# Patient Record
Sex: Female | Born: 1935
Health system: Southern US, Community
[De-identification: ages and names within clinical notes are randomized; demographics above are authoritative.]

## PROBLEM LIST (undated history)

## (undated) DIAGNOSIS — F32A Depression, unspecified: Secondary | ICD-10-CM

## (undated) DIAGNOSIS — N189 Chronic kidney disease, unspecified: Secondary | ICD-10-CM

## (undated) DIAGNOSIS — E119 Type 2 diabetes mellitus without complications: Secondary | ICD-10-CM

## (undated) DIAGNOSIS — K219 Gastro-esophageal reflux disease without esophagitis: Secondary | ICD-10-CM

## (undated) DIAGNOSIS — I10 Essential (primary) hypertension: Secondary | ICD-10-CM

## (undated) DIAGNOSIS — C801 Malignant (primary) neoplasm, unspecified: Secondary | ICD-10-CM

## (undated) DIAGNOSIS — F329 Major depressive disorder, single episode, unspecified: Secondary | ICD-10-CM

## (undated) DIAGNOSIS — M1990 Unspecified osteoarthritis, unspecified site: Secondary | ICD-10-CM

## (undated) HISTORY — PX: EYE SURGERY: SHX253

## (undated) HISTORY — PX: CERVICAL SPINE SURGERY: SHX589

## (undated) HISTORY — PX: APPENDECTOMY: SHX54

## (undated) HISTORY — PX: TONSILLECTOMY: SUR1361

## (undated) HISTORY — PX: BACK SURGERY: SHX140

---

## 1993-02-08 HISTORY — PX: BREAST SURGERY: SHX581

## 1997-05-09 ENCOUNTER — Encounter: Admission: RE | Admit: 1997-05-09 | Discharge: 1997-08-07 | Payer: Self-pay | Admitting: Oncology

## 1997-05-17 ENCOUNTER — Other Ambulatory Visit: Admission: RE | Admit: 1997-05-17 | Discharge: 1997-05-17 | Payer: Self-pay | Admitting: Oncology

## 1997-06-10 ENCOUNTER — Encounter: Admission: RE | Admit: 1997-06-10 | Discharge: 1997-09-08 | Payer: Self-pay | Admitting: Radiation Oncology

## 1997-06-27 ENCOUNTER — Ambulatory Visit (HOSPITAL_BASED_OUTPATIENT_CLINIC_OR_DEPARTMENT_OTHER): Admission: RE | Admit: 1997-06-27 | Discharge: 1997-06-27 | Payer: Self-pay

## 1997-10-01 ENCOUNTER — Other Ambulatory Visit: Admission: RE | Admit: 1997-10-01 | Discharge: 1997-10-01 | Payer: Self-pay | Admitting: *Deleted

## 1997-10-29 ENCOUNTER — Encounter: Admission: RE | Admit: 1997-10-29 | Discharge: 1998-01-27 | Payer: Self-pay

## 1998-09-11 ENCOUNTER — Other Ambulatory Visit: Admission: RE | Admit: 1998-09-11 | Discharge: 1998-09-11 | Payer: Self-pay | Admitting: *Deleted

## 1999-05-05 ENCOUNTER — Encounter: Admission: RE | Admit: 1999-05-05 | Discharge: 1999-05-05 | Payer: Self-pay

## 1999-08-06 ENCOUNTER — Other Ambulatory Visit: Admission: RE | Admit: 1999-08-06 | Discharge: 1999-08-06 | Payer: Self-pay | Admitting: *Deleted

## 2000-08-23 ENCOUNTER — Encounter: Admission: RE | Admit: 2000-08-23 | Discharge: 2000-08-23 | Payer: Self-pay

## 2001-01-19 ENCOUNTER — Ambulatory Visit (HOSPITAL_COMMUNITY): Admission: RE | Admit: 2001-01-19 | Discharge: 2001-01-19 | Payer: Self-pay | Admitting: Gastroenterology

## 2001-09-05 ENCOUNTER — Other Ambulatory Visit: Admission: RE | Admit: 2001-09-05 | Discharge: 2001-09-05 | Payer: Self-pay | Admitting: *Deleted

## 2002-08-01 ENCOUNTER — Encounter: Payer: Self-pay | Admitting: Emergency Medicine

## 2002-08-01 ENCOUNTER — Emergency Department (HOSPITAL_COMMUNITY): Admission: EM | Admit: 2002-08-01 | Discharge: 2002-08-01 | Payer: Self-pay

## 2002-09-04 ENCOUNTER — Other Ambulatory Visit: Admission: RE | Admit: 2002-09-04 | Discharge: 2002-09-04 | Payer: Self-pay | Admitting: *Deleted

## 2003-04-29 ENCOUNTER — Other Ambulatory Visit: Admission: RE | Admit: 2003-04-29 | Discharge: 2003-04-29 | Payer: Self-pay | Admitting: *Deleted

## 2003-05-24 ENCOUNTER — Inpatient Hospital Stay (HOSPITAL_COMMUNITY): Admission: EM | Admit: 2003-05-24 | Discharge: 2003-05-27 | Payer: Self-pay | Admitting: Emergency Medicine

## 2003-11-25 ENCOUNTER — Encounter: Admission: RE | Admit: 2003-11-25 | Discharge: 2003-11-25 | Payer: Self-pay | Admitting: Orthopedic Surgery

## 2003-12-09 ENCOUNTER — Encounter: Admission: RE | Admit: 2003-12-09 | Discharge: 2003-12-09 | Payer: Self-pay | Admitting: Orthopedic Surgery

## 2003-12-23 ENCOUNTER — Encounter: Admission: RE | Admit: 2003-12-23 | Discharge: 2003-12-23 | Payer: Self-pay | Admitting: Orthopedic Surgery

## 2004-02-07 ENCOUNTER — Ambulatory Visit: Payer: Self-pay | Admitting: Cardiovascular Disease

## 2004-02-07 ENCOUNTER — Inpatient Hospital Stay (HOSPITAL_COMMUNITY): Admission: EM | Admit: 2004-02-07 | Discharge: 2004-02-09 | Payer: Self-pay | Admitting: Emergency Medicine

## 2004-02-21 ENCOUNTER — Ambulatory Visit: Payer: Self-pay | Admitting: Cardiovascular Disease

## 2004-02-25 ENCOUNTER — Encounter: Admission: RE | Admit: 2004-02-25 | Discharge: 2004-02-25 | Payer: Self-pay | Admitting: Orthopaedic Surgery

## 2004-03-04 ENCOUNTER — Ambulatory Visit: Payer: Self-pay | Admitting: Cardiology

## 2004-03-09 ENCOUNTER — Inpatient Hospital Stay (HOSPITAL_COMMUNITY): Admission: RE | Admit: 2004-03-09 | Discharge: 2004-03-10 | Payer: Self-pay | Admitting: Orthopaedic Surgery

## 2004-03-20 ENCOUNTER — Ambulatory Visit: Payer: Self-pay

## 2004-03-22 ENCOUNTER — Inpatient Hospital Stay (HOSPITAL_COMMUNITY): Admission: EM | Admit: 2004-03-22 | Discharge: 2004-03-24 | Payer: Self-pay | Admitting: Emergency Medicine

## 2004-05-20 ENCOUNTER — Other Ambulatory Visit: Admission: RE | Admit: 2004-05-20 | Discharge: 2004-05-20 | Payer: Self-pay | Admitting: Obstetrics and Gynecology

## 2004-06-09 ENCOUNTER — Ambulatory Visit: Payer: Self-pay | Admitting: Oncology

## 2004-08-28 ENCOUNTER — Encounter: Admission: RE | Admit: 2004-08-28 | Discharge: 2004-08-28 | Payer: Self-pay | Admitting: Internal Medicine

## 2004-10-13 ENCOUNTER — Encounter: Admission: RE | Admit: 2004-10-13 | Discharge: 2004-10-13 | Payer: Self-pay | Admitting: Radiology

## 2005-02-02 ENCOUNTER — Inpatient Hospital Stay (HOSPITAL_COMMUNITY): Admission: EM | Admit: 2005-02-02 | Discharge: 2005-02-04 | Payer: Self-pay | Admitting: Emergency Medicine

## 2005-02-02 ENCOUNTER — Ambulatory Visit: Payer: Self-pay | Admitting: Cardiology

## 2005-06-09 ENCOUNTER — Ambulatory Visit: Payer: Self-pay | Admitting: Oncology

## 2005-06-11 LAB — CBC WITH DIFFERENTIAL/PLATELET
BASO%: 1 % (ref 0.0–2.0)
Basophils Absolute: 0.1 10*3/uL (ref 0.0–0.1)
EOS%: 2 % (ref 0.0–7.0)
Eosinophils Absolute: 0.2 10*3/uL (ref 0.0–0.5)
HCT: 36 % (ref 34.8–46.6)
HGB: 11.8 g/dL (ref 11.6–15.9)
LYMPH%: 31.5 % (ref 14.0–48.0)
MCH: 29.1 pg (ref 26.0–34.0)
MCHC: 32.8 g/dL (ref 32.0–36.0)
MCV: 88.6 fL (ref 81.0–101.0)
MONO#: 0.7 10*3/uL (ref 0.1–0.9)
MONO%: 8.5 % (ref 0.0–13.0)
NEUT#: 5 10*3/uL (ref 1.5–6.5)
NEUT%: 57 % (ref 39.6–76.8)
Platelets: 219 10*3/uL (ref 145–400)
RBC: 4.07 10*6/uL (ref 3.70–5.32)
RDW: 14 % (ref 11.3–14.5)
WBC: 8.8 10*3/uL (ref 3.9–10.0)
lymph#: 2.8 10*3/uL (ref 0.9–3.3)

## 2005-06-11 LAB — COMPREHENSIVE METABOLIC PANEL
ALT: 24 U/L (ref 0–40)
AST: 26 U/L (ref 0–37)
Albumin: 4.2 g/dL (ref 3.5–5.2)
Alkaline Phosphatase: 90 U/L (ref 39–117)
BUN: 33 mg/dL — ABNORMAL HIGH (ref 6–23)
CO2: 26 mEq/L (ref 19–32)
Calcium: 9.5 mg/dL (ref 8.4–10.5)
Chloride: 105 mEq/L (ref 96–112)
Creatinine, Ser: 1.4 mg/dL — ABNORMAL HIGH (ref 0.4–1.2)
Glucose, Bld: 113 mg/dL — ABNORMAL HIGH (ref 70–99)
Potassium: 4.1 mEq/L (ref 3.5–5.3)
Sodium: 138 mEq/L (ref 135–145)
Total Bilirubin: 0.4 mg/dL (ref 0.3–1.2)
Total Protein: 6.9 g/dL (ref 6.0–8.3)

## 2005-11-10 ENCOUNTER — Encounter: Admission: RE | Admit: 2005-11-10 | Discharge: 2005-12-10 | Payer: Self-pay | Admitting: Orthopaedic Surgery

## 2006-04-15 ENCOUNTER — Inpatient Hospital Stay (HOSPITAL_COMMUNITY): Admission: RE | Admit: 2006-04-15 | Discharge: 2006-04-19 | Payer: Self-pay | Admitting: Neurosurgery

## 2006-05-23 ENCOUNTER — Encounter: Admission: RE | Admit: 2006-05-23 | Discharge: 2006-05-23 | Payer: Self-pay | Admitting: Internal Medicine

## 2006-06-15 ENCOUNTER — Ambulatory Visit: Payer: Self-pay | Admitting: *Deleted

## 2006-07-27 ENCOUNTER — Encounter: Payer: Self-pay | Admitting: Cardiology

## 2006-07-27 ENCOUNTER — Ambulatory Visit: Payer: Self-pay

## 2006-07-27 ENCOUNTER — Ambulatory Visit: Payer: Self-pay | Admitting: Cardiology

## 2006-08-04 ENCOUNTER — Ambulatory Visit (HOSPITAL_COMMUNITY): Admission: RE | Admit: 2006-08-04 | Discharge: 2006-08-04 | Payer: Self-pay | Admitting: Internal Medicine

## 2006-08-07 ENCOUNTER — Emergency Department (HOSPITAL_COMMUNITY): Admission: EM | Admit: 2006-08-07 | Discharge: 2006-08-07 | Payer: Self-pay | Admitting: Emergency Medicine

## 2006-08-31 ENCOUNTER — Emergency Department (HOSPITAL_COMMUNITY): Admission: EM | Admit: 2006-08-31 | Discharge: 2006-08-31 | Payer: Self-pay | Admitting: *Deleted

## 2006-09-06 ENCOUNTER — Encounter: Admission: RE | Admit: 2006-09-06 | Discharge: 2006-09-06 | Payer: Self-pay | Admitting: Internal Medicine

## 2007-09-01 ENCOUNTER — Encounter: Admission: RE | Admit: 2007-09-01 | Discharge: 2007-09-01 | Payer: Self-pay | Admitting: Internal Medicine

## 2007-10-04 ENCOUNTER — Encounter: Admission: RE | Admit: 2007-10-04 | Discharge: 2007-11-22 | Payer: Self-pay | Admitting: Internal Medicine

## 2010-02-28 ENCOUNTER — Encounter: Payer: Self-pay | Admitting: Orthopaedic Surgery

## 2010-06-11 ENCOUNTER — Emergency Department (HOSPITAL_COMMUNITY)
Admission: EM | Admit: 2010-06-11 | Discharge: 2010-06-11 | Disposition: A | Discharge status: Home / Self Care | Payer: Medicare Other | Attending: Emergency Medicine | Admitting: Emergency Medicine

## 2010-06-11 ENCOUNTER — Emergency Department (HOSPITAL_COMMUNITY): Payer: Medicare Other

## 2010-06-11 DIAGNOSIS — Z7982 Long term (current) use of aspirin: Secondary | ICD-10-CM | POA: Insufficient documentation

## 2010-06-11 DIAGNOSIS — E119 Type 2 diabetes mellitus without complications: Secondary | ICD-10-CM | POA: Insufficient documentation

## 2010-06-11 DIAGNOSIS — K219 Gastro-esophageal reflux disease without esophagitis: Secondary | ICD-10-CM | POA: Insufficient documentation

## 2010-06-11 DIAGNOSIS — M79609 Pain in unspecified limb: Secondary | ICD-10-CM | POA: Insufficient documentation

## 2010-06-11 DIAGNOSIS — I10 Essential (primary) hypertension: Secondary | ICD-10-CM | POA: Insufficient documentation

## 2010-06-11 DIAGNOSIS — E7800 Pure hypercholesterolemia, unspecified: Secondary | ICD-10-CM | POA: Insufficient documentation

## 2010-06-11 DIAGNOSIS — Z79899 Other long term (current) drug therapy: Secondary | ICD-10-CM | POA: Insufficient documentation

## 2010-06-11 DIAGNOSIS — S9030XA Contusion of unspecified foot, initial encounter: Secondary | ICD-10-CM | POA: Insufficient documentation

## 2010-06-11 DIAGNOSIS — M7989 Other specified soft tissue disorders: Secondary | ICD-10-CM | POA: Insufficient documentation

## 2010-06-23 NOTE — Assessment & Plan Note (Signed)
[facility]                            CARDIOLOGY OFFICE NOTE   NAME:Deanna [name], Deanna [name]                    MRN:          [id number]  DATE:07/27/2006                            DOB:          04-10-1930    This is a very pleasant 77 year old white female patient who I saw on  Jun 15, 2006 for syncope which was felt secondary to orthostatic  hypotension in the setting of recent back surgery, anorexia, and  diarrhea. That had all resolved and when I saw her she had uncontrolled  hypertension because some of her medications were withheld. I restarted  her Benicar, hydrochlorothiazide. She has been keeping track of her  blood pressure at home and her atenolol was actually increased by Dr.  Clelia Croft from 50 mg a day to 50 b.i.d. Overall her stomach problems have  improved but her blood pressures have been running high, not quite as  high as when I saw her a month ago. They have been as low as 104 to 107  over 49, but as high as 170/80. Dr. Clelia Croft has also planned on putting on  a 24 hour blood pressure monitor once 1 becomes available in his office.  She had a 2D echo today. A preliminary report reveals normal left  ventricular function with mild TR.   CURRENT MEDICATIONS:  1. Zegerid 40 mg daily.  2. Vytorin 10/40 daily.  3. Evista 60 mg daily.  4. Lunesta 2 mg daily.  5. Aspirin 81 mg daily.  6. __________ 5 mg daily.  7. Benicar 40/25 one half daily.  8. Atenolol 50 mg daily b.i.d.   PHYSICAL EXAMINATION:  This is a very pleasant 77 year old white female  in no acute distress. Blood pressure is 148/67, pulse 61, weight 107 up  1 pound.  NECK: Without JVD, HJR, bruits or thyroid enlargement.  LUNGS: Clear anterior, posterior, and lateral.  HEART: Regular rate and rhythm at 61 beats per minute. Normal S1, S2.  She had a positive S4 with a question of a mid systolic click and a 1/6  systolic ejection murmur.  ABDOMEN: Soft, without organomegaly, masses,  lesions, or abnormal  tenderness.  EXTREMITIES: Without cyanosis, clubbing, or edema. She has good distal  pulses.   IMPRESSION:  1. Hypertension.  2. Syncope felt secondary to orthostatic hypotension in the setting of      recent surgery, anorexia, and diarrhea, resolved.  3. Normal coronary arteries in 2006. Normal left ventricular function.  4. Treated hyperlipidemia.  5. Gastroesophageal reflux disease.  6. Recent cervical neck surgery.   PLAN:  At this time I have asked the patient to increase her Benicar  40/25 mg to a whole tablet daily. She should continue her blood pressure  monitoring at home and go ahead with the 24 hour blood pressure monitor  with Dr. Clelia Croft. She will see Dr. Juanda Chance back in 1 to 2 months and call if  she has any further trouble.      Jacolyn Reedy, PA-C  Electronically Signed      Everardo Beals. Juanda Chance, MD, [facility]  Electronically Signed   ML/MedQ  DD: 07/27/2006  DT: 07/27/2006  Job #: [id number]

## 2010-06-26 NOTE — Discharge Summary (Signed)
NAMECAMDYN, LADEN NO.:  [id number]   MEDICAL RECORD NO.:  [id number]          PATIENT TYPE:  INP   LOCATION:  3007                         FACILITY:  [facility]   PHYSICIAN:  Payton Doughty, M.D.      DATE OF BIRTH:  [birth year]   DATE OF ADMISSION:  04/15/2006  DATE OF DISCHARGE:                               DISCHARGE SUMMARY   ADMISSION DIAGNOSIS:  Spondylosis C4-5, C5-6, C6-7.   DISCHARGE DIAGNOSIS:  Spondylosis C4-5, C5-6, C6-7.   OPERATION PERFORMED:  C4-5, C5-6, C6-7, anterior cervical decompression  fusion.   SURGEON:  Payton Doughty, M.D.   COMPLICATIONS:  None.   DISCHARGE STATUS:  Satisfactory.   INDICATIONS FOR PROCEDURE:  This 77 year old right-handed white lady  with cervical spondylytic myelopathy.  History and physical is recounted  in the chart.  She has a little bit of hypertension.  Neurologic exam  was intact with spasticity of the hands.  She was admitted after  ascertaining normal laboratory values and underwent an anterior  decompression fusion at 4-5, 5-6, 6-7.  Postoperatively, she has done  well.  She was out on the floor.  She did not need a drain.  She was on  a PCA for two days.  The PCA was stopped and now she is on oral  medications.  She had a mild amount of dysphagia that responded well to  two doses of Decadron.   On exam now, her strength is full, Hoffman's is negative.  She is being  discharged home to the care of her family.  Her follow up will be in the  [facility] office in about 10 days with an x-ray.           ______________________________  Payton Doughty, M.D.     MWR/MEDQ  D:  04/19/2006  T:  04/19/2006  Job:  [id number]

## 2010-06-26 NOTE — H&P (Signed)
NAMECHRISTINAMARIE, TALL NO.:  [id number]   MEDICAL RECORD NO.:  [id number]          PATIENT TYPE:  INP   LOCATION:  3007                         FACILITY:  [facility]   PHYSICIAN:  Payton Doughty, M.D.      DATE OF BIRTH:  09-08-1930   DATE OF ADMISSION:  04/15/2006  DATE OF DISCHARGE:                              HISTORY & PHYSICAL   ADMITTING DIAGNOSIS:  Spondylosis at C4-5, C5-6 and C6-7.   This very nice 77 year old right-handed white lady for nine months has  had increasing pain in her neck and across her shoulders and dysesthesia  in her hands.  MR shows cervical spondylosis with a cord compression and  she is admitted for an anterior decompression and fusion.   MEDICAL HISTORY:  Remarkable for hypertension.   SURGICAL HISTORY:  Mastectomy in 1998, biopsy on the right in 1990,  decompression by Dr. Ophelia Charter in 2006.   MEDICATIONS:  1. Atenolol 100 mg a day.  2. Benicar 40/25 one-half tab a day.  3. Vytorin 10/40 once a day.  4. Evista 60 mg a day.  5. Ultram 100 mg every eight hours.  6. Nexium 40 mg twice a day.  7. Lunesta 2 mg at night.  8. Cosamin vitamins and aspirin a day.  9. Hyoscyamine 0.375 mg on a p.r.n. basis.  10.Flax seeds, cinnamon, vitamin E, vitamin D and multivitamin and      uses Caltrate.   FAMILY HISTORY:  Both parents are deceased.  Hypertension and heart  disease run in the family.   SOCIAL HISTORY:  She does not smoke, drinks socially occasionally, is a  retired Engineer, materials.   REVIEW OF SYSTEMS:  Remarkable for glasses, balance disturbance,  hypertension, hypercholesterolemia, occasionally incontinence, arm  weakness, back pain, arm pain, arthritis, neck pain and diabetes.   PHYSICAL EXAMINATION:  HEENT exam is within normal limits.  She has  regional range of motion in her neck without limit.  Chest is clear.  Cardiac exam is regular rate and rhythm.  Neurologically, she is awake,  alert and oriented.  Cranial nerves are intact.  Motor  exam shows 5/5  strength throughout the upper and lower extremities.  There is no  current sensory deficit.  Reflexes are 2 at the biceps and triceps, 1 at  the brachioradialis.  She has a mildly positive Hoffmann's bilaterally.   MR demonstrates severe cervical spondylosis at C4-5, C5-6 and C6-7 and  posterior displacement of cord.   CLINICAL IMPRESSION:  Cervical spondylosis with myelopathy.   The plan is for an anterior decompression at C4-5, C5-6 and C6-7, the  risks and benefits of this approach have been discussed with her and she  wishes to proceed.           ______________________________  Payton Doughty, M.D.     MWR/MEDQ  D:  04/15/2006  T:  04/15/2006  Job:  [id number]

## 2010-06-26 NOTE — Cardiovascular Report (Signed)
NAMECHRISHA, Deanna [name]             ACCOUNT NO.:  [id number]   MEDICAL RECORD NO.:  [id number]          PATIENT TYPE:  INP   LOCATION:  6529                         FACILITY:  [facility]   PHYSICIAN:  Charlies Constable, M.D. [facility] DATE OF BIRTH:  [birth year]   DATE OF PROCEDURE:  02/03/2005  DATE OF DISCHARGE:                              CARDIAC CATHETERIZATION   CLINICAL HISTORY:  Deanna [name] is 77 years old and lives in [place].  She  has a history of hypertension and hyperlipidemia, and also a history of  GERD.  She awoke in the early morning hours with substernal chest pain with  heaviness in both arms.  She was admitted by Dr. Nicholes Mango with a  diagnosis of unstable angina.  ECG shows chronic left bundle branch block.   PROCEDURE:  The procedure was performed via the right femoral artery using  arterial sheath and 6 French preformed coronary catheters.  A front wall  arterial puncture was performed and Omnipaque contrast was used.  A distal  aortogram was performed to rule out renal vascular causes for hypertension.  The patient tolerated the procedure well and left the laboratory in  satisfactory condition.   RESULTS:  Left main coronary artery was free of significant disease.   Left anterior descending artery gave rise to a large diagonal branch, two  septal perforators, and a small diagonal branch.  These and the LAD proper  were free of significant disease. .   Circumflex artery gave rise to a small ramus branch, a large marginal  branch, a posterolateral branch, and a posterior descending branch.  These  vessels were free of significant disease.   The right coronary artery is a nondominant vessel that gave rise to two  right ventricular branches.  These vessels were free of significant disease.   The left ventriculogram performed in the RAO projection showed good wall  motion with no areas of hypokinesis.  The estimated ejection fraction was  60%.   The distal aortogram  showed 30% narrowing in the proximal portion of the  right renal artery.  There was also calcification in this vessel.   HEMODYNAMIC DATA:  The aortic pressure was 174/78 with a mean of 116.  The  left ventricular pressure was 174/17.   CONCLUSION:  Normal coronary angiography and left ventricular wall motion.   RECOMMENDATIONS:  Reassurance.  The etiology of the patient's chest pain is  not totally clear.  In view of the findings at catheterization, I think it  is unlikely her symptoms are cardiac.  Although not totally typical for  reflux, I  suspect her symptoms may be related to reflux and we will plan to intensify  her treatment for this by doubling her Nexium.  I spoke with Dr. Clelia Croft and  after the follow up with him next week, he can decide if any further  evaluation is needed.           ______________________________  Charlies Constable, M.D. [facility]     BB/MEDQ  D:  02/03/2005  T:  02/03/2005  Job:  [id number]   cc:   Kari Baars, M.D.  Fax: [phone]

## 2010-06-26 NOTE — Op Note (Signed)
NAMEKAJAL, SCALICI NO.:  [id number]   MEDICAL RECORD NO.:  [id number]          PATIENT TYPE:  INP   LOCATION:  3172                         FACILITY:  [facility]   PHYSICIAN:  Payton Doughty, M.D.      DATE OF BIRTH:  Jul 04, 1930   DATE OF PROCEDURE:  04/15/2006  DATE OF DISCHARGE:                               OPERATIVE REPORT   PREOPERATIVE DIAGNOSIS:  Spondylosis C4-5, C5-6 and C6-C7   POSTOPERATIVE DIAGNOSIS:  Spondylosis C4-5, C5-6 and C6-C7.   OPERATIVE PROCEDURE:  C4-5, C5-6, C6-7 anterior cervical decompression  and fusion with a Reflex Hybrid Plate.   SURGEON:  Payton Doughty, M.D.   ANESTHESIA:  General endotracheal.   PREP:  Sterile Betadine prep and scrub with alcohol wipe.   COMPLICATIONS:  None.   ASSISTANT:  Hilda Lias, M.D.   BODY OF TEXT:  This is a 77 year old lady with severe cervical  spondylitic myelopathy taken to the operative room, smoothly  anesthetized and intubated, placed spine on the operating table in the  halter head traction with the neck slightly extended.  Following shave,  prepping and draping in the usual sterile fashion, the skin was incised  starting three fingerbreadths below, extending two fingerbreadths above  the carotid tubercle in line with the sternocleidomastoid muscle on the  left side.  The platysma was identified, elevated, divided and  undermined.  Sternocleidomastoid was identified, and medial dissection  revealed the carotid artery tracked laterally to the left, trachea and  esophagus tracked laterally to the right, exposing the bones of the  anterior cervical spine.  Marker was placed.  Intraoperative x-ray  obtained to confirm correctness of the level.  Having confirmed  correctness of level, the longus colli was taken down bilaterally, and a  Shadow-Line self-retaining retractor was placed.  The esophagus was  carefully protected.  Diskectomy was carried out at C4-5, C5-6 and C6-7  under gross  observation.  There was a lot of ankylosis, and then gentle  retraction and dissection through the disk space allowed this to be  opened.  Once the disk spaces were opened, the operating microscope was  brought in, and we used microdissection technique to remove the  offending osteophytes and disk material, divide the posterior  longitudinal ligament and carried out foraminotomies over the nerve  roots bilaterally at each level.  The worst level was C5-6; second worst  was 4-5; C6-7 was the third worse.  Following complete decompression,  the wound was irrigated and hemostasis assured.  Eight-millimeter bone  grafts were fashioned from patellar allograft and tapped into place.  The Reflex Hybrid Plate was then placed, 51 mm.  The 12-mm screws were  used two in C4, two in C5, two in C6, and two in C7.  Intraoperative x-  ray showed good placement of bone grafts, plate and screws.  The  platysma and subcutaneous tissues were  reapproximated with 3-0 Vicryl in interrupted fashion.  The skin was  closed with 4-0 Vicryl in a running subcuticular fashion.  Benzoin and  Steri-Strips were placed and made occlusive with Telfa and OpSite.  The  patient placed in Aspen collar and returned to recovery room in good  condition.           ______________________________  Payton Doughty, M.D.     MWR/MEDQ  D:  04/15/2006  T:  04/15/2006  Job:  [id number]

## 2010-11-23 LAB — I-STAT 8, (EC8 V) (CONVERTED LAB)
Acid-base deficit: 1
BUN: 17
Bicarbonate: 21.3
Chloride: 105
Glucose, Bld: 101 — ABNORMAL HIGH
HCT: 44
Hemoglobin: 15
Operator id: 285841
Potassium: 3.9
Sodium: 135
TCO2: 22
pCO2, Ven: 28 — ABNORMAL LOW
pH, Ven: 7.491 — ABNORMAL HIGH

## 2010-11-23 LAB — POCT CARDIAC MARKERS
CKMB, poc: <1 — ABNORMAL LOW
Myoglobin, poc: 84.8
Operator id: 285841
Troponin i, poc: <0.05

## 2010-11-23 LAB — POCT I-STAT CREATININE
Creatinine, Ser: 1.3 — ABNORMAL HIGH
Operator id: 285841

## 2010-11-25 LAB — URINALYSIS, ROUTINE W REFLEX MICROSCOPIC
Bilirubin Urine: NEGATIVE
Glucose, UA: NEGATIVE
Hgb urine dipstick: NEGATIVE
Ketones, ur: NEGATIVE
Nitrite: NEGATIVE
Protein, ur: NEGATIVE
Specific Gravity, Urine: 1.016
Urobilinogen, UA: 0.2
pH: 7.5

## 2010-11-25 LAB — URINE CULTURE
Colony Count: NO GROWTH
Culture: NO GROWTH

## 2010-11-25 LAB — URINE MICROSCOPIC-ADD ON

## 2011-02-15 DIAGNOSIS — F333 Major depressive disorder, recurrent, severe with psychotic symptoms: Secondary | ICD-10-CM | POA: Diagnosis not present

## 2011-03-03 DIAGNOSIS — K648 Other hemorrhoids: Secondary | ICD-10-CM | POA: Diagnosis not present

## 2011-03-17 DIAGNOSIS — K648 Other hemorrhoids: Secondary | ICD-10-CM | POA: Diagnosis not present

## 2011-03-22 DIAGNOSIS — F333 Major depressive disorder, recurrent, severe with psychotic symptoms: Secondary | ICD-10-CM | POA: Diagnosis not present

## 2011-03-23 DIAGNOSIS — Z8262 Family history of osteoporosis: Secondary | ICD-10-CM | POA: Diagnosis not present

## 2011-03-23 DIAGNOSIS — M810 Age-related osteoporosis without current pathological fracture: Secondary | ICD-10-CM | POA: Diagnosis not present

## 2011-03-23 DIAGNOSIS — N951 Menopausal and female climacteric states: Secondary | ICD-10-CM | POA: Diagnosis not present

## 2011-03-23 DIAGNOSIS — Z124 Encounter for screening for malignant neoplasm of cervix: Secondary | ICD-10-CM | POA: Diagnosis not present

## 2011-03-31 DIAGNOSIS — K625 Hemorrhage of anus and rectum: Secondary | ICD-10-CM | POA: Diagnosis not present

## 2011-03-31 DIAGNOSIS — K648 Other hemorrhoids: Secondary | ICD-10-CM | POA: Diagnosis not present

## 2011-04-06 DIAGNOSIS — I10 Essential (primary) hypertension: Secondary | ICD-10-CM | POA: Diagnosis not present

## 2011-04-06 DIAGNOSIS — J069 Acute upper respiratory infection, unspecified: Secondary | ICD-10-CM | POA: Diagnosis not present

## 2011-04-19 DIAGNOSIS — F333 Major depressive disorder, recurrent, severe with psychotic symptoms: Secondary | ICD-10-CM | POA: Diagnosis not present

## 2011-05-19 DIAGNOSIS — F341 Dysthymic disorder: Secondary | ICD-10-CM | POA: Diagnosis not present

## 2011-05-19 DIAGNOSIS — E785 Hyperlipidemia, unspecified: Secondary | ICD-10-CM | POA: Diagnosis not present

## 2011-05-19 DIAGNOSIS — E119 Type 2 diabetes mellitus without complications: Secondary | ICD-10-CM | POA: Diagnosis not present

## 2011-05-19 DIAGNOSIS — I10 Essential (primary) hypertension: Secondary | ICD-10-CM | POA: Diagnosis not present

## 2011-05-26 DIAGNOSIS — F333 Major depressive disorder, recurrent, severe with psychotic symptoms: Secondary | ICD-10-CM | POA: Diagnosis not present

## 2011-05-26 DIAGNOSIS — Z1231 Encounter for screening mammogram for malignant neoplasm of breast: Secondary | ICD-10-CM | POA: Diagnosis not present

## 2011-07-21 DIAGNOSIS — F333 Major depressive disorder, recurrent, severe with psychotic symptoms: Secondary | ICD-10-CM | POA: Diagnosis not present

## 2011-08-30 DIAGNOSIS — F333 Major depressive disorder, recurrent, severe with psychotic symptoms: Secondary | ICD-10-CM | POA: Diagnosis not present

## 2011-09-27 DIAGNOSIS — F333 Major depressive disorder, recurrent, severe with psychotic symptoms: Secondary | ICD-10-CM | POA: Diagnosis not present

## 2011-09-29 DIAGNOSIS — M26629 Arthralgia of temporomandibular joint, unspecified side: Secondary | ICD-10-CM | POA: Diagnosis not present

## 2011-09-29 DIAGNOSIS — F341 Dysthymic disorder: Secondary | ICD-10-CM | POA: Diagnosis not present

## 2011-09-29 DIAGNOSIS — I10 Essential (primary) hypertension: Secondary | ICD-10-CM | POA: Diagnosis not present

## 2011-10-20 DIAGNOSIS — Z23 Encounter for immunization: Secondary | ICD-10-CM | POA: Diagnosis not present

## 2011-11-03 DIAGNOSIS — F333 Major depressive disorder, recurrent, severe with psychotic symptoms: Secondary | ICD-10-CM | POA: Diagnosis not present

## 2011-12-09 DIAGNOSIS — F333 Major depressive disorder, recurrent, severe with psychotic symptoms: Secondary | ICD-10-CM | POA: Diagnosis not present

## 2011-12-28 DIAGNOSIS — F333 Major depressive disorder, recurrent, severe with psychotic symptoms: Secondary | ICD-10-CM | POA: Diagnosis not present

## 2012-01-13 DIAGNOSIS — H40019 Open angle with borderline findings, low risk, unspecified eye: Secondary | ICD-10-CM | POA: Diagnosis not present

## 2012-01-13 DIAGNOSIS — H524 Presbyopia: Secondary | ICD-10-CM | POA: Diagnosis not present

## 2012-01-13 DIAGNOSIS — E11319 Type 2 diabetes mellitus with unspecified diabetic retinopathy without macular edema: Secondary | ICD-10-CM | POA: Diagnosis not present

## 2012-01-13 DIAGNOSIS — Z961 Presence of intraocular lens: Secondary | ICD-10-CM | POA: Diagnosis not present

## 2012-01-13 DIAGNOSIS — H43399 Other vitreous opacities, unspecified eye: Secondary | ICD-10-CM | POA: Diagnosis not present

## 2012-01-26 DIAGNOSIS — I10 Essential (primary) hypertension: Secondary | ICD-10-CM | POA: Diagnosis not present

## 2012-01-26 DIAGNOSIS — R05 Cough: Secondary | ICD-10-CM | POA: Diagnosis not present

## 2012-01-26 DIAGNOSIS — R059 Cough, unspecified: Secondary | ICD-10-CM | POA: Diagnosis not present

## 2012-01-26 DIAGNOSIS — K219 Gastro-esophageal reflux disease without esophagitis: Secondary | ICD-10-CM | POA: Diagnosis not present

## 2012-02-28 DIAGNOSIS — F333 Major depressive disorder, recurrent, severe with psychotic symptoms: Secondary | ICD-10-CM | POA: Diagnosis not present

## 2012-03-29 DIAGNOSIS — F333 Major depressive disorder, recurrent, severe with psychotic symptoms: Secondary | ICD-10-CM | POA: Diagnosis not present

## 2012-03-30 DIAGNOSIS — Z124 Encounter for screening for malignant neoplasm of cervix: Secondary | ICD-10-CM | POA: Diagnosis not present

## 2012-04-26 DIAGNOSIS — F333 Major depressive disorder, recurrent, severe with psychotic symptoms: Secondary | ICD-10-CM | POA: Diagnosis not present

## 2012-07-06 DIAGNOSIS — F333 Major depressive disorder, recurrent, severe with psychotic symptoms: Secondary | ICD-10-CM | POA: Diagnosis not present

## 2012-08-02 DIAGNOSIS — F333 Major depressive disorder, recurrent, severe with psychotic symptoms: Secondary | ICD-10-CM | POA: Diagnosis not present

## 2012-08-15 DIAGNOSIS — R269 Unspecified abnormalities of gait and mobility: Secondary | ICD-10-CM | POA: Diagnosis not present

## 2012-08-15 DIAGNOSIS — Z9181 History of falling: Secondary | ICD-10-CM | POA: Diagnosis not present

## 2012-08-15 DIAGNOSIS — IMO0002 Reserved for concepts with insufficient information to code with codable children: Secondary | ICD-10-CM | POA: Diagnosis not present

## 2012-08-15 DIAGNOSIS — I10 Essential (primary) hypertension: Secondary | ICD-10-CM | POA: Diagnosis not present

## 2012-08-16 DIAGNOSIS — Z961 Presence of intraocular lens: Secondary | ICD-10-CM | POA: Diagnosis not present

## 2012-08-16 DIAGNOSIS — E11319 Type 2 diabetes mellitus with unspecified diabetic retinopathy without macular edema: Secondary | ICD-10-CM | POA: Diagnosis not present

## 2012-08-16 DIAGNOSIS — H40019 Open angle with borderline findings, low risk, unspecified eye: Secondary | ICD-10-CM | POA: Diagnosis not present

## 2012-08-16 DIAGNOSIS — E1139 Type 2 diabetes mellitus with other diabetic ophthalmic complication: Secondary | ICD-10-CM | POA: Diagnosis not present

## 2012-08-24 DIAGNOSIS — Z9181 History of falling: Secondary | ICD-10-CM | POA: Diagnosis not present

## 2012-08-24 DIAGNOSIS — R269 Unspecified abnormalities of gait and mobility: Secondary | ICD-10-CM | POA: Diagnosis not present

## 2012-08-31 DIAGNOSIS — F333 Major depressive disorder, recurrent, severe with psychotic symptoms: Secondary | ICD-10-CM | POA: Diagnosis not present

## 2012-09-27 DIAGNOSIS — F333 Major depressive disorder, recurrent, severe with psychotic symptoms: Secondary | ICD-10-CM | POA: Diagnosis not present

## 2012-10-03 DIAGNOSIS — Z1231 Encounter for screening mammogram for malignant neoplasm of breast: Secondary | ICD-10-CM | POA: Diagnosis not present

## 2012-10-06 DIAGNOSIS — M19049 Primary osteoarthritis, unspecified hand: Secondary | ICD-10-CM | POA: Diagnosis not present

## 2012-10-06 DIAGNOSIS — E1169 Type 2 diabetes mellitus with other specified complication: Secondary | ICD-10-CM | POA: Diagnosis not present

## 2012-10-06 DIAGNOSIS — K589 Irritable bowel syndrome without diarrhea: Secondary | ICD-10-CM | POA: Diagnosis not present

## 2012-10-06 DIAGNOSIS — R82998 Other abnormal findings in urine: Secondary | ICD-10-CM | POA: Diagnosis not present

## 2012-10-06 DIAGNOSIS — I10 Essential (primary) hypertension: Secondary | ICD-10-CM | POA: Diagnosis not present

## 2012-10-06 DIAGNOSIS — R269 Unspecified abnormalities of gait and mobility: Secondary | ICD-10-CM | POA: Diagnosis not present

## 2012-10-06 DIAGNOSIS — R197 Diarrhea, unspecified: Secondary | ICD-10-CM | POA: Diagnosis not present

## 2012-10-06 DIAGNOSIS — N183 Chronic kidney disease, stage 3 unspecified: Secondary | ICD-10-CM | POA: Diagnosis not present

## 2012-10-06 DIAGNOSIS — E785 Hyperlipidemia, unspecified: Secondary | ICD-10-CM | POA: Diagnosis not present

## 2012-10-23 DIAGNOSIS — Z23 Encounter for immunization: Secondary | ICD-10-CM | POA: Diagnosis not present

## 2012-10-30 DIAGNOSIS — F333 Major depressive disorder, recurrent, severe with psychotic symptoms: Secondary | ICD-10-CM | POA: Diagnosis not present

## 2012-11-23 DIAGNOSIS — F333 Major depressive disorder, recurrent, severe with psychotic symptoms: Secondary | ICD-10-CM | POA: Diagnosis not present

## 2012-11-28 DIAGNOSIS — M4310 Spondylolisthesis, site unspecified: Secondary | ICD-10-CM | POA: Diagnosis not present

## 2012-11-28 DIAGNOSIS — M5137 Other intervertebral disc degeneration, lumbosacral region: Secondary | ICD-10-CM | POA: Diagnosis not present

## 2012-12-05 DIAGNOSIS — R634 Abnormal weight loss: Secondary | ICD-10-CM | POA: Diagnosis not present

## 2012-12-13 DIAGNOSIS — H40019 Open angle with borderline findings, low risk, unspecified eye: Secondary | ICD-10-CM | POA: Diagnosis not present

## 2013-01-18 DIAGNOSIS — E119 Type 2 diabetes mellitus without complications: Secondary | ICD-10-CM | POA: Diagnosis not present

## 2013-01-18 DIAGNOSIS — E11319 Type 2 diabetes mellitus with unspecified diabetic retinopathy without macular edema: Secondary | ICD-10-CM | POA: Diagnosis not present

## 2013-01-18 DIAGNOSIS — H35379 Puckering of macula, unspecified eye: Secondary | ICD-10-CM | POA: Diagnosis not present

## 2013-01-18 DIAGNOSIS — H40019 Open angle with borderline findings, low risk, unspecified eye: Secondary | ICD-10-CM | POA: Diagnosis not present

## 2013-01-29 DIAGNOSIS — F333 Major depressive disorder, recurrent, severe with psychotic symptoms: Secondary | ICD-10-CM | POA: Diagnosis not present

## 2013-03-12 DIAGNOSIS — F333 Major depressive disorder, recurrent, severe with psychotic symptoms: Secondary | ICD-10-CM | POA: Diagnosis not present

## 2013-04-09 DIAGNOSIS — Z23 Encounter for immunization: Secondary | ICD-10-CM | POA: Diagnosis not present

## 2013-04-09 DIAGNOSIS — E1139 Type 2 diabetes mellitus with other diabetic ophthalmic complication: Secondary | ICD-10-CM | POA: Diagnosis not present

## 2013-04-09 DIAGNOSIS — E785 Hyperlipidemia, unspecified: Secondary | ICD-10-CM | POA: Diagnosis not present

## 2013-04-09 DIAGNOSIS — N183 Chronic kidney disease, stage 3 unspecified: Secondary | ICD-10-CM | POA: Diagnosis not present

## 2013-04-09 DIAGNOSIS — Z1331 Encounter for screening for depression: Secondary | ICD-10-CM | POA: Diagnosis not present

## 2013-04-09 DIAGNOSIS — E11319 Type 2 diabetes mellitus with unspecified diabetic retinopathy without macular edema: Secondary | ICD-10-CM | POA: Diagnosis not present

## 2013-04-09 DIAGNOSIS — E1149 Type 2 diabetes mellitus with other diabetic neurological complication: Secondary | ICD-10-CM | POA: Diagnosis not present

## 2013-04-09 DIAGNOSIS — I10 Essential (primary) hypertension: Secondary | ICD-10-CM | POA: Diagnosis not present

## 2013-04-09 DIAGNOSIS — F341 Dysthymic disorder: Secondary | ICD-10-CM | POA: Diagnosis not present

## 2013-04-09 DIAGNOSIS — F333 Major depressive disorder, recurrent, severe with psychotic symptoms: Secondary | ICD-10-CM | POA: Diagnosis not present

## 2013-04-09 DIAGNOSIS — G609 Hereditary and idiopathic neuropathy, unspecified: Secondary | ICD-10-CM | POA: Diagnosis not present

## 2013-04-19 DIAGNOSIS — F333 Major depressive disorder, recurrent, severe with psychotic symptoms: Secondary | ICD-10-CM | POA: Diagnosis not present

## 2013-05-14 DIAGNOSIS — F333 Major depressive disorder, recurrent, severe with psychotic symptoms: Secondary | ICD-10-CM | POA: Diagnosis not present

## 2013-06-11 DIAGNOSIS — F333 Major depressive disorder, recurrent, severe with psychotic symptoms: Secondary | ICD-10-CM | POA: Diagnosis not present

## 2013-07-16 DIAGNOSIS — F333 Major depressive disorder, recurrent, severe with psychotic symptoms: Secondary | ICD-10-CM | POA: Diagnosis not present

## 2013-07-19 DIAGNOSIS — F331 Major depressive disorder, recurrent, moderate: Secondary | ICD-10-CM | POA: Diagnosis not present

## 2013-08-13 DIAGNOSIS — F331 Major depressive disorder, recurrent, moderate: Secondary | ICD-10-CM | POA: Diagnosis not present

## 2013-08-28 DIAGNOSIS — F341 Dysthymic disorder: Secondary | ICD-10-CM | POA: Diagnosis not present

## 2013-08-28 DIAGNOSIS — R197 Diarrhea, unspecified: Secondary | ICD-10-CM | POA: Diagnosis not present

## 2013-08-28 DIAGNOSIS — IMO0002 Reserved for concepts with insufficient information to code with codable children: Secondary | ICD-10-CM | POA: Diagnosis not present

## 2013-09-07 DIAGNOSIS — R197 Diarrhea, unspecified: Secondary | ICD-10-CM | POA: Diagnosis not present

## 2013-09-17 DIAGNOSIS — F331 Major depressive disorder, recurrent, moderate: Secondary | ICD-10-CM | POA: Diagnosis not present

## 2013-09-19 DIAGNOSIS — R197 Diarrhea, unspecified: Secondary | ICD-10-CM | POA: Diagnosis not present

## 2013-09-19 DIAGNOSIS — R634 Abnormal weight loss: Secondary | ICD-10-CM | POA: Diagnosis not present

## 2013-10-11 DIAGNOSIS — R978 Other abnormal tumor markers: Secondary | ICD-10-CM | POA: Diagnosis not present

## 2013-10-11 DIAGNOSIS — R634 Abnormal weight loss: Secondary | ICD-10-CM | POA: Diagnosis not present

## 2013-10-11 DIAGNOSIS — R197 Diarrhea, unspecified: Secondary | ICD-10-CM | POA: Diagnosis not present

## 2013-10-16 DIAGNOSIS — R634 Abnormal weight loss: Secondary | ICD-10-CM | POA: Diagnosis not present

## 2013-10-16 DIAGNOSIS — R197 Diarrhea, unspecified: Secondary | ICD-10-CM | POA: Diagnosis not present

## 2013-10-16 DIAGNOSIS — R978 Other abnormal tumor markers: Secondary | ICD-10-CM | POA: Diagnosis not present

## 2013-10-18 DIAGNOSIS — F331 Major depressive disorder, recurrent, moderate: Secondary | ICD-10-CM | POA: Diagnosis not present

## 2013-10-24 DIAGNOSIS — R82998 Other abnormal findings in urine: Secondary | ICD-10-CM | POA: Diagnosis not present

## 2013-10-24 DIAGNOSIS — M810 Age-related osteoporosis without current pathological fracture: Secondary | ICD-10-CM | POA: Diagnosis not present

## 2013-10-24 DIAGNOSIS — E1139 Type 2 diabetes mellitus with other diabetic ophthalmic complication: Secondary | ICD-10-CM | POA: Diagnosis not present

## 2013-10-24 DIAGNOSIS — Z1212 Encounter for screening for malignant neoplasm of rectum: Secondary | ICD-10-CM | POA: Diagnosis not present

## 2013-10-24 DIAGNOSIS — R809 Proteinuria, unspecified: Secondary | ICD-10-CM | POA: Diagnosis not present

## 2013-11-05 DIAGNOSIS — M19049 Primary osteoarthritis, unspecified hand: Secondary | ICD-10-CM | POA: Diagnosis not present

## 2013-11-05 DIAGNOSIS — Z1331 Encounter for screening for depression: Secondary | ICD-10-CM | POA: Diagnosis not present

## 2013-11-05 DIAGNOSIS — N183 Chronic kidney disease, stage 3 unspecified: Secondary | ICD-10-CM | POA: Diagnosis not present

## 2013-11-05 DIAGNOSIS — G609 Hereditary and idiopathic neuropathy, unspecified: Secondary | ICD-10-CM | POA: Diagnosis not present

## 2013-11-05 DIAGNOSIS — E785 Hyperlipidemia, unspecified: Secondary | ICD-10-CM | POA: Diagnosis not present

## 2013-11-05 DIAGNOSIS — K219 Gastro-esophageal reflux disease without esophagitis: Secondary | ICD-10-CM | POA: Diagnosis not present

## 2013-11-05 DIAGNOSIS — I10 Essential (primary) hypertension: Secondary | ICD-10-CM | POA: Diagnosis not present

## 2013-11-05 DIAGNOSIS — E1149 Type 2 diabetes mellitus with other diabetic neurological complication: Secondary | ICD-10-CM | POA: Diagnosis not present

## 2013-11-05 DIAGNOSIS — Z0000 Encounter for general adult medical examination without abnormal findings: Secondary | ICD-10-CM | POA: Diagnosis not present

## 2013-11-06 DIAGNOSIS — R197 Diarrhea, unspecified: Secondary | ICD-10-CM | POA: Diagnosis not present

## 2013-12-11 DIAGNOSIS — I10 Essential (primary) hypertension: Secondary | ICD-10-CM | POA: Diagnosis not present

## 2013-12-11 DIAGNOSIS — M19041 Primary osteoarthritis, right hand: Secondary | ICD-10-CM | POA: Diagnosis not present

## 2013-12-11 DIAGNOSIS — Z6820 Body mass index (BMI) 20.0-20.9, adult: Secondary | ICD-10-CM | POA: Diagnosis not present

## 2013-12-11 DIAGNOSIS — M810 Age-related osteoporosis without current pathological fracture: Secondary | ICD-10-CM | POA: Diagnosis not present

## 2013-12-19 DIAGNOSIS — F331 Major depressive disorder, recurrent, moderate: Secondary | ICD-10-CM | POA: Diagnosis not present

## 2013-12-24 DIAGNOSIS — R197 Diarrhea, unspecified: Secondary | ICD-10-CM | POA: Diagnosis not present

## 2014-01-16 DIAGNOSIS — F331 Major depressive disorder, recurrent, moderate: Secondary | ICD-10-CM | POA: Diagnosis not present

## 2014-02-27 DIAGNOSIS — Z6820 Body mass index (BMI) 20.0-20.9, adult: Secondary | ICD-10-CM | POA: Diagnosis not present

## 2014-02-27 DIAGNOSIS — E1139 Type 2 diabetes mellitus with other diabetic ophthalmic complication: Secondary | ICD-10-CM | POA: Diagnosis not present

## 2014-02-27 DIAGNOSIS — I10 Essential (primary) hypertension: Secondary | ICD-10-CM | POA: Diagnosis not present

## 2014-02-27 DIAGNOSIS — N183 Chronic kidney disease, stage 3 (moderate): Secondary | ICD-10-CM | POA: Diagnosis not present

## 2014-02-27 DIAGNOSIS — R197 Diarrhea, unspecified: Secondary | ICD-10-CM | POA: Diagnosis not present

## 2014-02-27 DIAGNOSIS — G629 Polyneuropathy, unspecified: Secondary | ICD-10-CM | POA: Diagnosis not present

## 2014-02-27 DIAGNOSIS — M810 Age-related osteoporosis without current pathological fracture: Secondary | ICD-10-CM | POA: Diagnosis not present

## 2014-02-27 DIAGNOSIS — E1149 Type 2 diabetes mellitus with other diabetic neurological complication: Secondary | ICD-10-CM | POA: Diagnosis not present

## 2014-02-27 DIAGNOSIS — E785 Hyperlipidemia, unspecified: Secondary | ICD-10-CM | POA: Diagnosis not present

## 2014-02-27 DIAGNOSIS — F418 Other specified anxiety disorders: Secondary | ICD-10-CM | POA: Diagnosis not present

## 2014-02-28 DIAGNOSIS — F331 Major depressive disorder, recurrent, moderate: Secondary | ICD-10-CM | POA: Diagnosis not present

## 2014-03-15 DIAGNOSIS — Z853 Personal history of malignant neoplasm of breast: Secondary | ICD-10-CM | POA: Diagnosis not present

## 2014-03-15 DIAGNOSIS — Z1231 Encounter for screening mammogram for malignant neoplasm of breast: Secondary | ICD-10-CM | POA: Diagnosis not present

## 2014-04-26 DIAGNOSIS — Z681 Body mass index (BMI) 19 or less, adult: Secondary | ICD-10-CM | POA: Diagnosis not present

## 2014-04-26 DIAGNOSIS — I10 Essential (primary) hypertension: Secondary | ICD-10-CM | POA: Diagnosis not present

## 2014-05-30 DIAGNOSIS — H524 Presbyopia: Secondary | ICD-10-CM | POA: Diagnosis not present

## 2014-05-30 DIAGNOSIS — H40013 Open angle with borderline findings, low risk, bilateral: Secondary | ICD-10-CM | POA: Diagnosis not present

## 2014-05-30 DIAGNOSIS — E11319 Type 2 diabetes mellitus with unspecified diabetic retinopathy without macular edema: Secondary | ICD-10-CM | POA: Diagnosis not present

## 2014-05-30 DIAGNOSIS — H04123 Dry eye syndrome of bilateral lacrimal glands: Secondary | ICD-10-CM | POA: Diagnosis not present

## 2014-06-05 DIAGNOSIS — F331 Major depressive disorder, recurrent, moderate: Secondary | ICD-10-CM | POA: Diagnosis not present

## 2014-07-03 DIAGNOSIS — F331 Major depressive disorder, recurrent, moderate: Secondary | ICD-10-CM | POA: Diagnosis not present

## 2014-07-31 DIAGNOSIS — F331 Major depressive disorder, recurrent, moderate: Secondary | ICD-10-CM | POA: Diagnosis not present

## 2014-10-07 DIAGNOSIS — M19041 Primary osteoarthritis, right hand: Secondary | ICD-10-CM | POA: Diagnosis not present

## 2014-10-08 ENCOUNTER — Encounter (HOSPITAL_COMMUNITY): Payer: Self-pay

## 2014-10-08 ENCOUNTER — Ambulatory Visit (HOSPITAL_COMMUNITY)
Admission: RE | Admit: 2014-10-08 | Discharge: 2014-10-08 | Disposition: A | Discharge status: Home / Self Care | Payer: Medicare Other | Source: Ambulatory Visit | Attending: Internal Medicine | Admitting: Internal Medicine

## 2014-10-08 DIAGNOSIS — M810 Age-related osteoporosis without current pathological fracture: Secondary | ICD-10-CM | POA: Diagnosis not present

## 2014-10-08 HISTORY — DX: Essential (primary) hypertension: I10

## 2014-10-08 HISTORY — DX: Unspecified osteoarthritis, unspecified site: M19.90

## 2014-10-08 HISTORY — DX: Malignant (primary) neoplasm, unspecified: C80.1

## 2014-10-08 HISTORY — DX: Major depressive disorder, single episode, unspecified: F32.9

## 2014-10-08 HISTORY — DX: Depression, unspecified: F32.A

## 2014-10-08 HISTORY — DX: Type 2 diabetes mellitus without complications: E11.9

## 2014-10-08 MED ORDER — DENOSUMAB 60 MG/ML ~~LOC~~ SOLN
60.0000 mg | Freq: Once | SUBCUTANEOUS | Status: AC
  Administered 2014-10-08: 60 mg via SUBCUTANEOUS
  Filled 2014-10-08: qty 1

## 2014-10-30 DIAGNOSIS — F331 Major depressive disorder, recurrent, moderate: Secondary | ICD-10-CM | POA: Diagnosis not present

## 2014-11-05 DIAGNOSIS — E1139 Type 2 diabetes mellitus with other diabetic ophthalmic complication: Secondary | ICD-10-CM | POA: Diagnosis not present

## 2014-11-05 DIAGNOSIS — N390 Urinary tract infection, site not specified: Secondary | ICD-10-CM | POA: Diagnosis not present

## 2014-11-05 DIAGNOSIS — E785 Hyperlipidemia, unspecified: Secondary | ICD-10-CM | POA: Diagnosis not present

## 2014-11-05 DIAGNOSIS — R8290 Unspecified abnormal findings in urine: Secondary | ICD-10-CM | POA: Diagnosis not present

## 2014-11-08 DIAGNOSIS — Z23 Encounter for immunization: Secondary | ICD-10-CM | POA: Diagnosis not present

## 2014-11-12 DIAGNOSIS — G629 Polyneuropathy, unspecified: Secondary | ICD-10-CM | POA: Diagnosis not present

## 2014-11-12 DIAGNOSIS — E11319 Type 2 diabetes mellitus with unspecified diabetic retinopathy without macular edema: Secondary | ICD-10-CM | POA: Diagnosis not present

## 2014-11-12 DIAGNOSIS — Z0000 Encounter for general adult medical examination without abnormal findings: Secondary | ICD-10-CM | POA: Diagnosis not present

## 2014-11-12 DIAGNOSIS — M810 Age-related osteoporosis without current pathological fracture: Secondary | ICD-10-CM | POA: Diagnosis not present

## 2014-11-12 DIAGNOSIS — E1139 Type 2 diabetes mellitus with other diabetic ophthalmic complication: Secondary | ICD-10-CM | POA: Diagnosis not present

## 2014-11-12 DIAGNOSIS — N183 Chronic kidney disease, stage 3 (moderate): Secondary | ICD-10-CM | POA: Diagnosis not present

## 2014-11-12 DIAGNOSIS — I10 Essential (primary) hypertension: Secondary | ICD-10-CM | POA: Diagnosis not present

## 2014-11-12 DIAGNOSIS — M19041 Primary osteoarthritis, right hand: Secondary | ICD-10-CM | POA: Diagnosis not present

## 2014-11-12 DIAGNOSIS — F418 Other specified anxiety disorders: Secondary | ICD-10-CM | POA: Diagnosis not present

## 2014-11-12 DIAGNOSIS — Z1389 Encounter for screening for other disorder: Secondary | ICD-10-CM | POA: Diagnosis not present

## 2014-11-12 DIAGNOSIS — E1149 Type 2 diabetes mellitus with other diabetic neurological complication: Secondary | ICD-10-CM | POA: Diagnosis not present

## 2014-11-12 DIAGNOSIS — Z681 Body mass index (BMI) 19 or less, adult: Secondary | ICD-10-CM | POA: Diagnosis not present

## 2014-11-27 DIAGNOSIS — F331 Major depressive disorder, recurrent, moderate: Secondary | ICD-10-CM | POA: Diagnosis not present

## 2014-12-05 DIAGNOSIS — F331 Major depressive disorder, recurrent, moderate: Secondary | ICD-10-CM | POA: Diagnosis not present

## 2014-12-23 DIAGNOSIS — F331 Major depressive disorder, recurrent, moderate: Secondary | ICD-10-CM | POA: Diagnosis not present

## 2014-12-27 DIAGNOSIS — M65341 Trigger finger, right ring finger: Secondary | ICD-10-CM | POA: Diagnosis not present

## 2014-12-27 DIAGNOSIS — M5380 Other specified dorsopathies, site unspecified: Secondary | ICD-10-CM | POA: Diagnosis not present

## 2014-12-27 DIAGNOSIS — Z681 Body mass index (BMI) 19 or less, adult: Secondary | ICD-10-CM | POA: Diagnosis not present

## 2014-12-27 DIAGNOSIS — R2689 Other abnormalities of gait and mobility: Secondary | ICD-10-CM | POA: Diagnosis not present

## 2014-12-27 DIAGNOSIS — R05 Cough: Secondary | ICD-10-CM | POA: Diagnosis not present

## 2015-01-06 DIAGNOSIS — H903 Sensorineural hearing loss, bilateral: Secondary | ICD-10-CM | POA: Diagnosis not present

## 2015-01-07 DIAGNOSIS — M545 Low back pain: Secondary | ICD-10-CM | POA: Diagnosis not present

## 2015-01-16 DIAGNOSIS — E1149 Type 2 diabetes mellitus with other diabetic neurological complication: Secondary | ICD-10-CM | POA: Diagnosis not present

## 2015-01-16 DIAGNOSIS — Z681 Body mass index (BMI) 19 or less, adult: Secondary | ICD-10-CM | POA: Diagnosis not present

## 2015-01-16 DIAGNOSIS — R21 Rash and other nonspecific skin eruption: Secondary | ICD-10-CM | POA: Diagnosis not present

## 2015-01-22 DIAGNOSIS — R2689 Other abnormalities of gait and mobility: Secondary | ICD-10-CM | POA: Diagnosis not present

## 2015-01-22 DIAGNOSIS — R531 Weakness: Secondary | ICD-10-CM | POA: Diagnosis not present

## 2015-02-24 DIAGNOSIS — F331 Major depressive disorder, recurrent, moderate: Secondary | ICD-10-CM | POA: Diagnosis not present

## 2015-03-07 DIAGNOSIS — M47816 Spondylosis without myelopathy or radiculopathy, lumbar region: Secondary | ICD-10-CM | POA: Diagnosis not present

## 2015-03-07 DIAGNOSIS — M19041 Primary osteoarthritis, right hand: Secondary | ICD-10-CM | POA: Diagnosis not present

## 2015-03-07 DIAGNOSIS — M545 Low back pain: Secondary | ICD-10-CM | POA: Diagnosis not present

## 2015-03-27 DIAGNOSIS — R8290 Unspecified abnormal findings in urine: Secondary | ICD-10-CM | POA: Diagnosis not present

## 2015-03-27 DIAGNOSIS — N3941 Urge incontinence: Secondary | ICD-10-CM | POA: Diagnosis not present

## 2015-03-27 DIAGNOSIS — M545 Low back pain: Secondary | ICD-10-CM | POA: Diagnosis not present

## 2015-03-27 DIAGNOSIS — N390 Urinary tract infection, site not specified: Secondary | ICD-10-CM | POA: Diagnosis not present

## 2015-03-27 DIAGNOSIS — Z681 Body mass index (BMI) 19 or less, adult: Secondary | ICD-10-CM | POA: Diagnosis not present

## 2015-04-02 DIAGNOSIS — F331 Major depressive disorder, recurrent, moderate: Secondary | ICD-10-CM | POA: Diagnosis not present

## 2015-04-07 DIAGNOSIS — N3941 Urge incontinence: Secondary | ICD-10-CM | POA: Diagnosis not present

## 2015-04-09 ENCOUNTER — Ambulatory Visit (HOSPITAL_COMMUNITY)
Admission: RE | Admit: 2015-04-09 | Discharge: 2015-04-09 | Disposition: A | Discharge status: Home / Self Care | Payer: Medicare Other | Source: Ambulatory Visit | Attending: Internal Medicine | Admitting: Internal Medicine

## 2015-04-14 DIAGNOSIS — H04123 Dry eye syndrome of bilateral lacrimal glands: Secondary | ICD-10-CM | POA: Diagnosis not present

## 2015-04-14 DIAGNOSIS — H16223 Keratoconjunctivitis sicca, not specified as Sjogren's, bilateral: Secondary | ICD-10-CM | POA: Diagnosis not present

## 2015-04-14 DIAGNOSIS — H539 Unspecified visual disturbance: Secondary | ICD-10-CM | POA: Diagnosis not present

## 2015-04-14 DIAGNOSIS — Z961 Presence of intraocular lens: Secondary | ICD-10-CM | POA: Diagnosis not present

## 2015-04-17 DIAGNOSIS — N3941 Urge incontinence: Secondary | ICD-10-CM | POA: Diagnosis not present

## 2015-04-17 DIAGNOSIS — R600 Localized edema: Secondary | ICD-10-CM | POA: Diagnosis not present

## 2015-04-17 DIAGNOSIS — I10 Essential (primary) hypertension: Secondary | ICD-10-CM | POA: Diagnosis not present

## 2015-04-17 DIAGNOSIS — Z1389 Encounter for screening for other disorder: Secondary | ICD-10-CM | POA: Diagnosis not present

## 2015-04-17 DIAGNOSIS — Z6820 Body mass index (BMI) 20.0-20.9, adult: Secondary | ICD-10-CM | POA: Diagnosis not present

## 2015-04-17 DIAGNOSIS — I951 Orthostatic hypotension: Secondary | ICD-10-CM | POA: Diagnosis not present

## 2015-04-21 DIAGNOSIS — Z1231 Encounter for screening mammogram for malignant neoplasm of breast: Secondary | ICD-10-CM | POA: Diagnosis not present

## 2015-04-21 DIAGNOSIS — Z853 Personal history of malignant neoplasm of breast: Secondary | ICD-10-CM | POA: Diagnosis not present

## 2015-04-29 DIAGNOSIS — D225 Melanocytic nevi of trunk: Secondary | ICD-10-CM | POA: Diagnosis not present

## 2015-04-29 DIAGNOSIS — C44729 Squamous cell carcinoma of skin of left lower limb, including hip: Secondary | ICD-10-CM | POA: Diagnosis not present

## 2015-04-30 DIAGNOSIS — F331 Major depressive disorder, recurrent, moderate: Secondary | ICD-10-CM | POA: Diagnosis not present

## 2015-05-15 DIAGNOSIS — Z6820 Body mass index (BMI) 20.0-20.9, adult: Secondary | ICD-10-CM | POA: Diagnosis not present

## 2015-05-15 DIAGNOSIS — E1139 Type 2 diabetes mellitus with other diabetic ophthalmic complication: Secondary | ICD-10-CM | POA: Diagnosis not present

## 2015-05-15 DIAGNOSIS — I10 Essential (primary) hypertension: Secondary | ICD-10-CM | POA: Diagnosis not present

## 2015-05-15 DIAGNOSIS — E784 Other hyperlipidemia: Secondary | ICD-10-CM | POA: Diagnosis not present

## 2015-05-15 DIAGNOSIS — N183 Chronic kidney disease, stage 3 (moderate): Secondary | ICD-10-CM | POA: Diagnosis not present

## 2015-05-15 DIAGNOSIS — I951 Orthostatic hypotension: Secondary | ICD-10-CM | POA: Diagnosis not present

## 2015-05-15 DIAGNOSIS — E1149 Type 2 diabetes mellitus with other diabetic neurological complication: Secondary | ICD-10-CM | POA: Diagnosis not present

## 2015-05-15 DIAGNOSIS — M545 Low back pain: Secondary | ICD-10-CM | POA: Diagnosis not present

## 2015-05-20 DIAGNOSIS — M6281 Muscle weakness (generalized): Secondary | ICD-10-CM | POA: Diagnosis not present

## 2015-05-20 DIAGNOSIS — R262 Difficulty in walking, not elsewhere classified: Secondary | ICD-10-CM | POA: Diagnosis not present

## 2015-05-20 DIAGNOSIS — N183 Chronic kidney disease, stage 3 (moderate): Secondary | ICD-10-CM | POA: Diagnosis not present

## 2015-05-20 DIAGNOSIS — M545 Low back pain: Secondary | ICD-10-CM | POA: Diagnosis not present

## 2015-05-21 DIAGNOSIS — N183 Chronic kidney disease, stage 3 (moderate): Secondary | ICD-10-CM | POA: Diagnosis not present

## 2015-05-21 DIAGNOSIS — M545 Low back pain: Secondary | ICD-10-CM | POA: Diagnosis not present

## 2015-05-21 DIAGNOSIS — R262 Difficulty in walking, not elsewhere classified: Secondary | ICD-10-CM | POA: Diagnosis not present

## 2015-05-21 DIAGNOSIS — M6281 Muscle weakness (generalized): Secondary | ICD-10-CM | POA: Diagnosis not present

## 2015-05-23 DIAGNOSIS — M6281 Muscle weakness (generalized): Secondary | ICD-10-CM | POA: Diagnosis not present

## 2015-05-23 DIAGNOSIS — N183 Chronic kidney disease, stage 3 (moderate): Secondary | ICD-10-CM | POA: Diagnosis not present

## 2015-05-23 DIAGNOSIS — M545 Low back pain: Secondary | ICD-10-CM | POA: Diagnosis not present

## 2015-05-23 DIAGNOSIS — R262 Difficulty in walking, not elsewhere classified: Secondary | ICD-10-CM | POA: Diagnosis not present

## 2015-05-26 DIAGNOSIS — N183 Chronic kidney disease, stage 3 (moderate): Secondary | ICD-10-CM | POA: Diagnosis not present

## 2015-05-26 DIAGNOSIS — M6281 Muscle weakness (generalized): Secondary | ICD-10-CM | POA: Diagnosis not present

## 2015-05-26 DIAGNOSIS — M545 Low back pain: Secondary | ICD-10-CM | POA: Diagnosis not present

## 2015-05-26 DIAGNOSIS — R262 Difficulty in walking, not elsewhere classified: Secondary | ICD-10-CM | POA: Diagnosis not present

## 2015-05-28 DIAGNOSIS — F331 Major depressive disorder, recurrent, moderate: Secondary | ICD-10-CM | POA: Diagnosis not present

## 2015-05-30 DIAGNOSIS — M545 Low back pain: Secondary | ICD-10-CM | POA: Diagnosis not present

## 2015-05-30 DIAGNOSIS — R262 Difficulty in walking, not elsewhere classified: Secondary | ICD-10-CM | POA: Diagnosis not present

## 2015-05-30 DIAGNOSIS — M6281 Muscle weakness (generalized): Secondary | ICD-10-CM | POA: Diagnosis not present

## 2015-05-30 DIAGNOSIS — N183 Chronic kidney disease, stage 3 (moderate): Secondary | ICD-10-CM | POA: Diagnosis not present

## 2015-06-02 DIAGNOSIS — M6281 Muscle weakness (generalized): Secondary | ICD-10-CM | POA: Diagnosis not present

## 2015-06-02 DIAGNOSIS — M545 Low back pain: Secondary | ICD-10-CM | POA: Diagnosis not present

## 2015-06-02 DIAGNOSIS — R262 Difficulty in walking, not elsewhere classified: Secondary | ICD-10-CM | POA: Diagnosis not present

## 2015-06-02 DIAGNOSIS — N183 Chronic kidney disease, stage 3 (moderate): Secondary | ICD-10-CM | POA: Diagnosis not present

## 2015-06-04 DIAGNOSIS — M545 Low back pain: Secondary | ICD-10-CM | POA: Diagnosis not present

## 2015-06-04 DIAGNOSIS — M6281 Muscle weakness (generalized): Secondary | ICD-10-CM | POA: Diagnosis not present

## 2015-06-04 DIAGNOSIS — R262 Difficulty in walking, not elsewhere classified: Secondary | ICD-10-CM | POA: Diagnosis not present

## 2015-06-04 DIAGNOSIS — N183 Chronic kidney disease, stage 3 (moderate): Secondary | ICD-10-CM | POA: Diagnosis not present

## 2015-06-11 DIAGNOSIS — M6281 Muscle weakness (generalized): Secondary | ICD-10-CM | POA: Diagnosis not present

## 2015-06-11 DIAGNOSIS — R262 Difficulty in walking, not elsewhere classified: Secondary | ICD-10-CM | POA: Diagnosis not present

## 2015-06-11 DIAGNOSIS — N183 Chronic kidney disease, stage 3 (moderate): Secondary | ICD-10-CM | POA: Diagnosis not present

## 2015-06-11 DIAGNOSIS — M545 Low back pain: Secondary | ICD-10-CM | POA: Diagnosis not present

## 2015-06-12 DIAGNOSIS — R262 Difficulty in walking, not elsewhere classified: Secondary | ICD-10-CM | POA: Diagnosis not present

## 2015-06-12 DIAGNOSIS — M6281 Muscle weakness (generalized): Secondary | ICD-10-CM | POA: Diagnosis not present

## 2015-06-12 DIAGNOSIS — N183 Chronic kidney disease, stage 3 (moderate): Secondary | ICD-10-CM | POA: Diagnosis not present

## 2015-06-12 DIAGNOSIS — M545 Low back pain: Secondary | ICD-10-CM | POA: Diagnosis not present

## 2015-06-17 DIAGNOSIS — Z08 Encounter for follow-up examination after completed treatment for malignant neoplasm: Secondary | ICD-10-CM | POA: Diagnosis not present

## 2015-06-17 DIAGNOSIS — B078 Other viral warts: Secondary | ICD-10-CM | POA: Diagnosis not present

## 2015-06-17 DIAGNOSIS — Z85828 Personal history of other malignant neoplasm of skin: Secondary | ICD-10-CM | POA: Diagnosis not present

## 2015-06-25 DIAGNOSIS — F331 Major depressive disorder, recurrent, moderate: Secondary | ICD-10-CM | POA: Diagnosis not present

## 2015-07-01 DIAGNOSIS — I10 Essential (primary) hypertension: Secondary | ICD-10-CM | POA: Diagnosis not present

## 2015-07-01 DIAGNOSIS — R609 Edema, unspecified: Secondary | ICD-10-CM | POA: Diagnosis not present

## 2015-07-01 DIAGNOSIS — Z6820 Body mass index (BMI) 20.0-20.9, adult: Secondary | ICD-10-CM | POA: Diagnosis not present

## 2015-07-01 DIAGNOSIS — G629 Polyneuropathy, unspecified: Secondary | ICD-10-CM | POA: Diagnosis not present

## 2015-07-23 DIAGNOSIS — K641 Second degree hemorrhoids: Secondary | ICD-10-CM | POA: Diagnosis not present

## 2015-07-23 DIAGNOSIS — K6289 Other specified diseases of anus and rectum: Secondary | ICD-10-CM | POA: Diagnosis not present

## 2015-07-30 DIAGNOSIS — M545 Low back pain: Secondary | ICD-10-CM | POA: Diagnosis not present

## 2015-07-30 DIAGNOSIS — M47816 Spondylosis without myelopathy or radiculopathy, lumbar region: Secondary | ICD-10-CM | POA: Diagnosis not present

## 2015-07-30 DIAGNOSIS — M19041 Primary osteoarthritis, right hand: Secondary | ICD-10-CM | POA: Diagnosis not present

## 2015-08-06 DIAGNOSIS — K641 Second degree hemorrhoids: Secondary | ICD-10-CM | POA: Diagnosis not present

## 2015-08-20 DIAGNOSIS — K6289 Other specified diseases of anus and rectum: Secondary | ICD-10-CM | POA: Diagnosis not present

## 2015-08-20 DIAGNOSIS — K641 Second degree hemorrhoids: Secondary | ICD-10-CM | POA: Diagnosis not present

## 2015-08-20 DIAGNOSIS — R109 Unspecified abdominal pain: Secondary | ICD-10-CM | POA: Diagnosis not present

## 2015-08-29 DIAGNOSIS — R300 Dysuria: Secondary | ICD-10-CM | POA: Diagnosis not present

## 2015-08-29 DIAGNOSIS — N390 Urinary tract infection, site not specified: Secondary | ICD-10-CM | POA: Diagnosis not present

## 2015-09-03 DIAGNOSIS — F331 Major depressive disorder, recurrent, moderate: Secondary | ICD-10-CM | POA: Diagnosis not present

## 2015-09-17 DIAGNOSIS — M542 Cervicalgia: Secondary | ICD-10-CM | POA: Diagnosis not present

## 2015-09-17 DIAGNOSIS — M545 Low back pain: Secondary | ICD-10-CM | POA: Diagnosis not present

## 2015-09-18 ENCOUNTER — Other Ambulatory Visit: Payer: Self-pay | Admitting: Orthopaedic Surgery

## 2015-09-18 DIAGNOSIS — M545 Low back pain: Secondary | ICD-10-CM

## 2015-09-18 DIAGNOSIS — M542 Cervicalgia: Secondary | ICD-10-CM

## 2015-09-28 ENCOUNTER — Other Ambulatory Visit: Payer: Medicare Other

## 2015-10-01 DIAGNOSIS — F331 Major depressive disorder, recurrent, moderate: Secondary | ICD-10-CM | POA: Diagnosis not present

## 2015-10-07 ENCOUNTER — Ambulatory Visit
Admission: RE | Admit: 2015-10-07 | Discharge: 2015-10-07 | Disposition: A | Discharge status: Home / Self Care | Payer: Medicare Other | Source: Ambulatory Visit | Attending: Orthopaedic Surgery | Admitting: Orthopaedic Surgery

## 2015-10-07 DIAGNOSIS — M545 Low back pain: Secondary | ICD-10-CM

## 2015-10-07 DIAGNOSIS — M5031 Other cervical disc degeneration,  high cervical region: Secondary | ICD-10-CM | POA: Diagnosis not present

## 2015-10-07 DIAGNOSIS — M4806 Spinal stenosis, lumbar region: Secondary | ICD-10-CM | POA: Diagnosis not present

## 2015-10-07 DIAGNOSIS — M542 Cervicalgia: Secondary | ICD-10-CM

## 2015-10-14 DIAGNOSIS — M545 Low back pain: Secondary | ICD-10-CM | POA: Diagnosis not present

## 2015-10-22 DIAGNOSIS — M47816 Spondylosis without myelopathy or radiculopathy, lumbar region: Secondary | ICD-10-CM | POA: Diagnosis not present

## 2015-12-08 DIAGNOSIS — E1149 Type 2 diabetes mellitus with other diabetic neurological complication: Secondary | ICD-10-CM | POA: Diagnosis not present

## 2015-12-08 DIAGNOSIS — M810 Age-related osteoporosis without current pathological fracture: Secondary | ICD-10-CM | POA: Diagnosis not present

## 2015-12-08 DIAGNOSIS — R2689 Other abnormalities of gait and mobility: Secondary | ICD-10-CM | POA: Diagnosis not present

## 2015-12-08 DIAGNOSIS — I10 Essential (primary) hypertension: Secondary | ICD-10-CM | POA: Diagnosis not present

## 2015-12-08 DIAGNOSIS — E784 Other hyperlipidemia: Secondary | ICD-10-CM | POA: Diagnosis not present

## 2015-12-08 DIAGNOSIS — G629 Polyneuropathy, unspecified: Secondary | ICD-10-CM | POA: Diagnosis not present

## 2015-12-08 DIAGNOSIS — N390 Urinary tract infection, site not specified: Secondary | ICD-10-CM | POA: Diagnosis not present

## 2015-12-08 DIAGNOSIS — R8299 Other abnormal findings in urine: Secondary | ICD-10-CM | POA: Diagnosis not present

## 2015-12-09 DIAGNOSIS — H539 Unspecified visual disturbance: Secondary | ICD-10-CM | POA: Diagnosis not present

## 2015-12-09 DIAGNOSIS — H04123 Dry eye syndrome of bilateral lacrimal glands: Secondary | ICD-10-CM | POA: Diagnosis not present

## 2015-12-09 DIAGNOSIS — H353131 Nonexudative age-related macular degeneration, bilateral, early dry stage: Secondary | ICD-10-CM | POA: Diagnosis not present

## 2015-12-09 DIAGNOSIS — H16223 Keratoconjunctivitis sicca, not specified as Sjogren's, bilateral: Secondary | ICD-10-CM | POA: Diagnosis not present

## 2015-12-10 DIAGNOSIS — F331 Major depressive disorder, recurrent, moderate: Secondary | ICD-10-CM | POA: Diagnosis not present

## 2015-12-12 DIAGNOSIS — E11319 Type 2 diabetes mellitus with unspecified diabetic retinopathy without macular edema: Secondary | ICD-10-CM | POA: Diagnosis not present

## 2015-12-12 DIAGNOSIS — E1149 Type 2 diabetes mellitus with other diabetic neurological complication: Secondary | ICD-10-CM | POA: Diagnosis not present

## 2015-12-12 DIAGNOSIS — M810 Age-related osteoporosis without current pathological fracture: Secondary | ICD-10-CM | POA: Diagnosis not present

## 2015-12-12 DIAGNOSIS — R2689 Other abnormalities of gait and mobility: Secondary | ICD-10-CM | POA: Diagnosis not present

## 2015-12-12 DIAGNOSIS — Z0000 Encounter for general adult medical examination without abnormal findings: Secondary | ICD-10-CM | POA: Diagnosis not present

## 2015-12-12 DIAGNOSIS — F329 Major depressive disorder, single episode, unspecified: Secondary | ICD-10-CM | POA: Diagnosis not present

## 2015-12-12 DIAGNOSIS — E1139 Type 2 diabetes mellitus with other diabetic ophthalmic complication: Secondary | ICD-10-CM | POA: Diagnosis not present

## 2015-12-12 DIAGNOSIS — E784 Other hyperlipidemia: Secondary | ICD-10-CM | POA: Diagnosis not present

## 2015-12-12 DIAGNOSIS — I10 Essential (primary) hypertension: Secondary | ICD-10-CM | POA: Diagnosis not present

## 2015-12-12 DIAGNOSIS — N183 Chronic kidney disease, stage 3 (moderate): Secondary | ICD-10-CM | POA: Diagnosis not present

## 2015-12-12 DIAGNOSIS — G629 Polyneuropathy, unspecified: Secondary | ICD-10-CM | POA: Diagnosis not present

## 2015-12-12 DIAGNOSIS — Z1389 Encounter for screening for other disorder: Secondary | ICD-10-CM | POA: Diagnosis not present

## 2015-12-29 ENCOUNTER — Ambulatory Visit (HOSPITAL_COMMUNITY)
Admission: RE | Admit: 2015-12-29 | Discharge: 2015-12-29 | Disposition: A | Discharge status: Home / Self Care | Payer: Medicare Other | Source: Ambulatory Visit | Attending: Internal Medicine | Admitting: Internal Medicine

## 2015-12-29 ENCOUNTER — Other Ambulatory Visit (HOSPITAL_COMMUNITY): Payer: Self-pay | Admitting: Internal Medicine

## 2015-12-29 ENCOUNTER — Encounter (HOSPITAL_COMMUNITY): Payer: Self-pay

## 2015-12-29 DIAGNOSIS — M810 Age-related osteoporosis without current pathological fracture: Secondary | ICD-10-CM | POA: Diagnosis not present

## 2015-12-29 MED ORDER — DENOSUMAB 60 MG/ML ~~LOC~~ SOLN
60.0000 mg | Freq: Once | SUBCUTANEOUS | Status: AC
Start: 2015-12-29 — End: 2015-12-29
  Administered 2015-12-29: 60 mg via SUBCUTANEOUS
  Filled 2015-12-29: qty 1

## 2016-01-07 ENCOUNTER — Telehealth (INDEPENDENT_AMBULATORY_CARE_PROVIDER_SITE_OTHER): Payer: Self-pay | Admitting: Sports Medicine

## 2016-01-07 MED ORDER — CELECOXIB 100 MG PO CAPS: 100.0000 mg | ORAL_CAPSULE | Freq: Two times a day (BID) | ORAL | 1 refills | Status: DC | PRN

## 2016-01-07 NOTE — Telephone Encounter (Signed)
Rx for Celebrex sent to [facility]

## 2016-01-12 ENCOUNTER — Ambulatory Visit (HOSPITAL_COMMUNITY): Payer: Medicare Other

## 2016-01-13 ENCOUNTER — Encounter (INDEPENDENT_AMBULATORY_CARE_PROVIDER_SITE_OTHER): Payer: Self-pay | Admitting: Orthopaedic Surgery

## 2016-01-13 ENCOUNTER — Ambulatory Visit (INDEPENDENT_AMBULATORY_CARE_PROVIDER_SITE_OTHER): Payer: Medicare Other | Admitting: Orthopaedic Surgery

## 2016-01-13 VITALS — BP 142/77 | HR 72 | Ht 61.75 in | Wt 117.0 lb

## 2016-01-13 DIAGNOSIS — M47812 Spondylosis without myelopathy or radiculopathy, cervical region: Secondary | ICD-10-CM

## 2016-01-13 DIAGNOSIS — M545 Low back pain, unspecified: Secondary | ICD-10-CM

## 2016-01-13 DIAGNOSIS — G8929 Other chronic pain: Secondary | ICD-10-CM | POA: Diagnosis not present

## 2016-01-13 NOTE — Progress Notes (Signed)
Office Visit Note   Patient: Deanna [name]           Date of Birth: 03-Mar-1930           MRN: [id number] Visit Date: 01/13/2016              Requested by: Marton Redwood, MD [address] [place], [place] [address] PCP: Marton Redwood, MD   Assessment & Plan: Visit Diagnoses:  1. Chronic midline low back pain without sciatica   2. Spondylosis of cervical region without myelopathy or radiculopathy     Plan: We reviewed previous MRI from 6 months ago cervical spinal lumbar spine. She has some lateral recess narrowing facet arthritis at L4-5-5 1. Injection of 51 gave her about 60% relief for a month or 2 then some recurrent symptoms. MIP worthwhile to consider injecting L4-5 facets. She is really not having any associated radicular pain in her legs. She has some pain with rotation of her neck and has some spondylosis both above and below her C4-C7 fusion. Her symptoms tend to give her problems on the right than left in her neck. At present we'll continue with conservative treatment topical cream intermittent heating pad show low start on walking program with a goal 2 miles per day to help stress. Her husband remains in the memory care but still wants to come home whenever she sees him in this is a considerable source of stress for her.  Follow-Up Instructions: Return if symptoms worsen or fail to improve.   Orders:  No orders of the defined types were placed in this encounter.  No orders of the defined types were placed in this encounter.     Procedures: No procedures performed   Clinical Data: No additional findings.   Subjective: Chief Complaint  Patient presents with  . Right Hand - Pain  . Right Shoulder - Pain  . Lower Back - Pain    Patient returns for follow up lumbar spine. She had facet injection with Dr. Ernestina Patches on 10/22/2015 and states it helped a little.  She continues to have back pain. She also complains with pain in her right shoulder and fingers. The finger  that bothers her the most right now is the right index and middle. The middle finger is swollen. She takes Tylenol twice a day and takes Tramadol if the pain gets really bad.    Review of Systems  Constitutional: Negative for chills and diaphoresis.  HENT: Negative for ear discharge, ear pain and nosebleeds.   Eyes: Negative for discharge and visual disturbance.  Respiratory: Negative for cough, choking and shortness of breath.   Cardiovascular: Negative for chest pain and palpitations.  Gastrointestinal: Negative for abdominal distention and abdominal pain.  Endocrine: Negative for cold intolerance and heat intolerance.  Genitourinary: Negative for flank pain and hematuria.  Skin: Negative for rash and wound.  Neurological: Negative for seizures and speech difficulty.  Hematological: Negative for adenopathy. Does not bruise/bleed easily.  Psychiatric/Behavioral: Negative for agitation and suicidal ideas.     Objective: Vital Signs: BP (!) 142/77   Pulse 72   Ht 5' 1.75" (1.568 m)   Wt 117 lb (53.1 kg)   BMI 21.57 kg/m   Physical Exam  Constitutional: She is oriented to person, place, and time. She appears well-developed.  HENT:  Head: Normocephalic.  Right Ear: External ear normal.  Left Ear: External ear normal.  Eyes: Pupils are equal, round, and reactive to light.  Neck: No tracheal deviation present. No  thyromegaly present.  Cardiovascular: Normal rate.   Pulmonary/Chest: Effort normal. She has no wheezes. She has no rales.  Abdominal: Soft.  Musculoskeletal:  Patient has some discomfort with cervical rotation. Reflexes are 2+ and symmetrical upper and lower extremity. No pain with hip range of motion negative straight leg raising negative bowstring. Anterior tibia chills strong.  Neurological: She is alert and oriented to person, place, and time.  Skin: Skin is warm and dry.  Psychiatric: She has a normal mood and affect. Her behavior is normal.    Ortho Exam no  scoliosis pelvis is level. Sensory lower extremities is normal except down feet and ankles were she has some edema decreased sensation both feet which she relates to peripheral neuropathy. Distal pulses are 2+ no pitting edema no calf tenderness no venous stasis changes.  Specialty Comments:  No specialty comments available.  Imaging: No results found.   PMFS History: Patient Active Problem List   Diagnosis Date Noted  . Chronic midline low back pain without sciatica 01/13/2016   Past Medical History:  Diagnosis Date  . Arthritis    all over  . Cancer ([place])    breast  . Depression   . Diabetes mellitus without complication (HCC)    diet controlled  . Hypertension     No family history on file.  Past Surgical History:  Procedure Laterality Date  . APPENDECTOMY     with lower back surgery  . BACK SURGERY     x2  . BREAST SURGERY Left 1995   complete left mastectomy  . CERVICAL SPINE SURGERY    . EYE SURGERY Bilateral    cataracts  . TONSILLECTOMY     at age 82   Social History   Occupational History  . Not on file.   Social History Main Topics  . Smoking status: Never Smoker  . Smokeless tobacco: Never Used  . Alcohol use Not on file  . Drug use: Unknown  . Sexual activity: Not on file

## 2016-01-26 ENCOUNTER — Telehealth (INDEPENDENT_AMBULATORY_CARE_PROVIDER_SITE_OTHER): Payer: Self-pay | Admitting: Orthopaedic Surgery

## 2016-01-26 NOTE — Telephone Encounter (Signed)
Please advise 

## 2016-01-26 NOTE — Telephone Encounter (Signed)
Pt requesting if we could fax an rx for massage and heat for lower back and shoulders? To  [facility], the fax is [phone] and pt number is [phone]

## 2016-01-26 NOTE — Telephone Encounter (Signed)
OK to do thanks 

## 2016-01-29 DIAGNOSIS — H3562 Retinal hemorrhage, left eye: Secondary | ICD-10-CM | POA: Diagnosis not present

## 2016-01-29 DIAGNOSIS — H35072 Retinal telangiectasis, left eye: Secondary | ICD-10-CM | POA: Diagnosis not present

## 2016-01-29 DIAGNOSIS — H35071 Retinal telangiectasis, right eye: Secondary | ICD-10-CM | POA: Diagnosis not present

## 2016-01-29 DIAGNOSIS — H43821 Vitreomacular adhesion, right eye: Secondary | ICD-10-CM | POA: Diagnosis not present

## 2016-02-04 NOTE — Telephone Encounter (Signed)
Script entered and faxed.  I called patient to advise but no one answered.

## 2016-02-20 ENCOUNTER — Telehealth (INDEPENDENT_AMBULATORY_CARE_PROVIDER_SITE_OTHER): Payer: Self-pay | Admitting: Orthopaedic Surgery

## 2016-02-20 NOTE — Telephone Encounter (Signed)
Patient called asked if Dr Lorin Mercy will write her a Rx for (PT) for her neck and back. Patient said to send Rx to [facility]. The ph# is [phone]. The fax# is [phone].   The number to contact patient is [phone]

## 2016-02-20 NOTE — Telephone Encounter (Signed)
Ok thanks 

## 2016-02-20 NOTE — Telephone Encounter (Signed)
Please advise 

## 2016-02-23 NOTE — Telephone Encounter (Signed)
Script filled out and faxed per patient request. I left voicemail for patient advising.

## 2016-03-02 DIAGNOSIS — M47812 Spondylosis without myelopathy or radiculopathy, cervical region: Secondary | ICD-10-CM | POA: Diagnosis not present

## 2016-03-02 DIAGNOSIS — M6281 Muscle weakness (generalized): Secondary | ICD-10-CM | POA: Diagnosis not present

## 2016-03-02 DIAGNOSIS — M545 Low back pain: Secondary | ICD-10-CM | POA: Diagnosis not present

## 2016-03-03 DIAGNOSIS — F331 Major depressive disorder, recurrent, moderate: Secondary | ICD-10-CM | POA: Diagnosis not present

## 2016-03-04 DIAGNOSIS — M6281 Muscle weakness (generalized): Secondary | ICD-10-CM | POA: Diagnosis not present

## 2016-03-04 DIAGNOSIS — M47812 Spondylosis without myelopathy or radiculopathy, cervical region: Secondary | ICD-10-CM | POA: Diagnosis not present

## 2016-03-04 DIAGNOSIS — M545 Low back pain: Secondary | ICD-10-CM | POA: Diagnosis not present

## 2016-03-09 DIAGNOSIS — M47812 Spondylosis without myelopathy or radiculopathy, cervical region: Secondary | ICD-10-CM | POA: Diagnosis not present

## 2016-03-09 DIAGNOSIS — M545 Low back pain: Secondary | ICD-10-CM | POA: Diagnosis not present

## 2016-03-09 DIAGNOSIS — M6281 Muscle weakness (generalized): Secondary | ICD-10-CM | POA: Diagnosis not present

## 2016-03-10 DIAGNOSIS — E1139 Type 2 diabetes mellitus with other diabetic ophthalmic complication: Secondary | ICD-10-CM | POA: Diagnosis not present

## 2016-03-10 DIAGNOSIS — I10 Essential (primary) hypertension: Secondary | ICD-10-CM | POA: Diagnosis not present

## 2016-03-10 DIAGNOSIS — Z6821 Body mass index (BMI) 21.0-21.9, adult: Secondary | ICD-10-CM | POA: Diagnosis not present

## 2016-03-11 DIAGNOSIS — M47812 Spondylosis without myelopathy or radiculopathy, cervical region: Secondary | ICD-10-CM | POA: Diagnosis not present

## 2016-03-11 DIAGNOSIS — M6281 Muscle weakness (generalized): Secondary | ICD-10-CM | POA: Diagnosis not present

## 2016-03-11 DIAGNOSIS — M545 Low back pain: Secondary | ICD-10-CM | POA: Diagnosis not present

## 2016-03-19 DIAGNOSIS — M6281 Muscle weakness (generalized): Secondary | ICD-10-CM | POA: Diagnosis not present

## 2016-03-19 DIAGNOSIS — M545 Low back pain: Secondary | ICD-10-CM | POA: Diagnosis not present

## 2016-03-19 DIAGNOSIS — M47812 Spondylosis without myelopathy or radiculopathy, cervical region: Secondary | ICD-10-CM | POA: Diagnosis not present

## 2016-03-29 DIAGNOSIS — M6281 Muscle weakness (generalized): Secondary | ICD-10-CM | POA: Diagnosis not present

## 2016-03-29 DIAGNOSIS — M47812 Spondylosis without myelopathy or radiculopathy, cervical region: Secondary | ICD-10-CM | POA: Diagnosis not present

## 2016-03-29 DIAGNOSIS — M545 Low back pain: Secondary | ICD-10-CM | POA: Diagnosis not present

## 2016-04-22 DIAGNOSIS — Z85828 Personal history of other malignant neoplasm of skin: Secondary | ICD-10-CM | POA: Diagnosis not present

## 2016-04-22 DIAGNOSIS — Z08 Encounter for follow-up examination after completed treatment for malignant neoplasm: Secondary | ICD-10-CM | POA: Diagnosis not present

## 2016-04-22 DIAGNOSIS — L820 Inflamed seborrheic keratosis: Secondary | ICD-10-CM | POA: Diagnosis not present

## 2016-04-22 DIAGNOSIS — L250 Unspecified contact dermatitis due to cosmetics: Secondary | ICD-10-CM | POA: Diagnosis not present

## 2016-05-26 DIAGNOSIS — F331 Major depressive disorder, recurrent, moderate: Secondary | ICD-10-CM | POA: Diagnosis not present

## 2016-05-31 DIAGNOSIS — Z1231 Encounter for screening mammogram for malignant neoplasm of breast: Secondary | ICD-10-CM | POA: Diagnosis not present

## 2016-05-31 DIAGNOSIS — Z853 Personal history of malignant neoplasm of breast: Secondary | ICD-10-CM | POA: Diagnosis not present

## 2016-06-10 DIAGNOSIS — Z6820 Body mass index (BMI) 20.0-20.9, adult: Secondary | ICD-10-CM | POA: Diagnosis not present

## 2016-06-10 DIAGNOSIS — E784 Other hyperlipidemia: Secondary | ICD-10-CM | POA: Diagnosis not present

## 2016-06-10 DIAGNOSIS — N183 Chronic kidney disease, stage 3 (moderate): Secondary | ICD-10-CM | POA: Diagnosis not present

## 2016-06-10 DIAGNOSIS — I10 Essential (primary) hypertension: Secondary | ICD-10-CM | POA: Diagnosis not present

## 2016-06-10 DIAGNOSIS — E1139 Type 2 diabetes mellitus with other diabetic ophthalmic complication: Secondary | ICD-10-CM | POA: Diagnosis not present

## 2016-06-10 DIAGNOSIS — G6289 Other specified polyneuropathies: Secondary | ICD-10-CM | POA: Diagnosis not present

## 2016-06-10 DIAGNOSIS — E1149 Type 2 diabetes mellitus with other diabetic neurological complication: Secondary | ICD-10-CM | POA: Diagnosis not present

## 2016-06-28 ENCOUNTER — Ambulatory Visit (HOSPITAL_COMMUNITY)
Admission: RE | Admit: 2016-06-28 | Discharge: 2016-06-28 | Disposition: A | Discharge status: Home / Self Care | Payer: Medicare Other | Source: Ambulatory Visit | Attending: Internal Medicine | Admitting: Internal Medicine

## 2016-07-01 DIAGNOSIS — I10 Essential (primary) hypertension: Secondary | ICD-10-CM | POA: Diagnosis not present

## 2016-07-01 DIAGNOSIS — Z6820 Body mass index (BMI) 20.0-20.9, adult: Secondary | ICD-10-CM | POA: Diagnosis not present

## 2016-07-01 DIAGNOSIS — E1139 Type 2 diabetes mellitus with other diabetic ophthalmic complication: Secondary | ICD-10-CM | POA: Diagnosis not present

## 2016-07-20 DIAGNOSIS — F331 Major depressive disorder, recurrent, moderate: Secondary | ICD-10-CM | POA: Diagnosis not present

## 2016-08-13 DIAGNOSIS — R05 Cough: Secondary | ICD-10-CM | POA: Diagnosis not present

## 2016-08-13 DIAGNOSIS — I10 Essential (primary) hypertension: Secondary | ICD-10-CM | POA: Diagnosis not present

## 2016-08-13 DIAGNOSIS — K588 Other irritable bowel syndrome: Secondary | ICD-10-CM | POA: Diagnosis not present

## 2016-08-13 DIAGNOSIS — K589 Irritable bowel syndrome without diarrhea: Secondary | ICD-10-CM | POA: Diagnosis not present

## 2016-08-13 DIAGNOSIS — G6289 Other specified polyneuropathies: Secondary | ICD-10-CM | POA: Diagnosis not present

## 2016-08-13 DIAGNOSIS — Z6820 Body mass index (BMI) 20.0-20.9, adult: Secondary | ICD-10-CM | POA: Diagnosis not present

## 2016-08-13 DIAGNOSIS — R0789 Other chest pain: Secondary | ICD-10-CM | POA: Diagnosis not present

## 2016-08-13 DIAGNOSIS — K219 Gastro-esophageal reflux disease without esophagitis: Secondary | ICD-10-CM | POA: Diagnosis not present

## 2016-08-13 DIAGNOSIS — I447 Left bundle-branch block, unspecified: Secondary | ICD-10-CM | POA: Diagnosis not present

## 2016-08-17 DIAGNOSIS — M5489 Other dorsalgia: Secondary | ICD-10-CM | POA: Diagnosis not present

## 2016-09-01 DIAGNOSIS — M810 Age-related osteoporosis without current pathological fracture: Secondary | ICD-10-CM | POA: Diagnosis not present

## 2016-09-01 DIAGNOSIS — Z681 Body mass index (BMI) 19 or less, adult: Secondary | ICD-10-CM | POA: Diagnosis not present

## 2016-09-01 DIAGNOSIS — E1149 Type 2 diabetes mellitus with other diabetic neurological complication: Secondary | ICD-10-CM | POA: Diagnosis not present

## 2016-09-01 DIAGNOSIS — E1139 Type 2 diabetes mellitus with other diabetic ophthalmic complication: Secondary | ICD-10-CM | POA: Diagnosis not present

## 2016-09-01 DIAGNOSIS — N183 Chronic kidney disease, stage 3 (moderate): Secondary | ICD-10-CM | POA: Diagnosis not present

## 2016-09-01 DIAGNOSIS — I10 Essential (primary) hypertension: Secondary | ICD-10-CM | POA: Diagnosis not present

## 2016-09-09 DIAGNOSIS — I10 Essential (primary) hypertension: Secondary | ICD-10-CM | POA: Diagnosis not present

## 2016-09-09 DIAGNOSIS — M810 Age-related osteoporosis without current pathological fracture: Secondary | ICD-10-CM | POA: Diagnosis not present

## 2016-09-09 DIAGNOSIS — Z6820 Body mass index (BMI) 20.0-20.9, adult: Secondary | ICD-10-CM | POA: Diagnosis not present

## 2016-09-09 DIAGNOSIS — M898X1 Other specified disorders of bone, shoulder: Secondary | ICD-10-CM | POA: Diagnosis not present

## 2016-09-14 DIAGNOSIS — H524 Presbyopia: Secondary | ICD-10-CM | POA: Diagnosis not present

## 2016-09-14 DIAGNOSIS — H04123 Dry eye syndrome of bilateral lacrimal glands: Secondary | ICD-10-CM | POA: Diagnosis not present

## 2016-09-14 DIAGNOSIS — H353131 Nonexudative age-related macular degeneration, bilateral, early dry stage: Secondary | ICD-10-CM | POA: Diagnosis not present

## 2016-09-14 DIAGNOSIS — H16223 Keratoconjunctivitis sicca, not specified as Sjogren's, bilateral: Secondary | ICD-10-CM | POA: Diagnosis not present

## 2016-09-16 DIAGNOSIS — Z6820 Body mass index (BMI) 20.0-20.9, adult: Secondary | ICD-10-CM | POA: Diagnosis not present

## 2016-09-23 ENCOUNTER — Inpatient Hospital Stay (HOSPITAL_COMMUNITY)
Admission: EM | Admit: 2016-09-23 | Discharge: 2016-09-28 | DRG: 871 | Disposition: A | Discharge status: Home Health | Payer: Medicare Other | Attending: Internal Medicine | Admitting: Internal Medicine

## 2016-09-23 ENCOUNTER — Inpatient Hospital Stay (HOSPITAL_COMMUNITY): Payer: Medicare Other

## 2016-09-23 ENCOUNTER — Encounter (HOSPITAL_COMMUNITY): Payer: Self-pay | Admitting: Emergency Medicine

## 2016-09-23 ENCOUNTER — Emergency Department (HOSPITAL_COMMUNITY): Payer: Medicare Other

## 2016-09-23 DIAGNOSIS — R0602 Shortness of breath: Secondary | ICD-10-CM

## 2016-09-23 DIAGNOSIS — R531 Weakness: Secondary | ICD-10-CM | POA: Diagnosis not present

## 2016-09-23 DIAGNOSIS — R630 Anorexia: Secondary | ICD-10-CM | POA: Diagnosis present

## 2016-09-23 DIAGNOSIS — K219 Gastro-esophageal reflux disease without esophagitis: Secondary | ICD-10-CM | POA: Diagnosis not present

## 2016-09-23 DIAGNOSIS — Z7982 Long term (current) use of aspirin: Secondary | ICD-10-CM

## 2016-09-23 DIAGNOSIS — J9601 Acute respiratory failure with hypoxia: Secondary | ICD-10-CM | POA: Diagnosis not present

## 2016-09-23 DIAGNOSIS — K8020 Calculus of gallbladder without cholecystitis without obstruction: Secondary | ICD-10-CM | POA: Diagnosis present

## 2016-09-23 DIAGNOSIS — F329 Major depressive disorder, single episode, unspecified: Secondary | ICD-10-CM | POA: Diagnosis present

## 2016-09-23 DIAGNOSIS — Z7984 Long term (current) use of oral hypoglycemic drugs: Secondary | ICD-10-CM | POA: Diagnosis not present

## 2016-09-23 DIAGNOSIS — E119 Type 2 diabetes mellitus without complications: Secondary | ICD-10-CM | POA: Diagnosis present

## 2016-09-23 DIAGNOSIS — N183 Chronic kidney disease, stage 3 (moderate): Secondary | ICD-10-CM | POA: Diagnosis not present

## 2016-09-23 DIAGNOSIS — I951 Orthostatic hypotension: Secondary | ICD-10-CM | POA: Diagnosis not present

## 2016-09-23 DIAGNOSIS — J810 Acute pulmonary edema: Secondary | ICD-10-CM | POA: Diagnosis not present

## 2016-09-23 DIAGNOSIS — Z79899 Other long term (current) drug therapy: Secondary | ICD-10-CM

## 2016-09-23 DIAGNOSIS — E440 Moderate protein-calorie malnutrition: Secondary | ICD-10-CM | POA: Diagnosis present

## 2016-09-23 DIAGNOSIS — N179 Acute kidney failure, unspecified: Secondary | ICD-10-CM

## 2016-09-23 DIAGNOSIS — E860 Dehydration: Secondary | ICD-10-CM | POA: Diagnosis not present

## 2016-09-23 DIAGNOSIS — N182 Chronic kidney disease, stage 2 (mild): Secondary | ICD-10-CM | POA: Diagnosis not present

## 2016-09-23 DIAGNOSIS — F418 Other specified anxiety disorders: Secondary | ICD-10-CM | POA: Diagnosis not present

## 2016-09-23 DIAGNOSIS — R918 Other nonspecific abnormal finding of lung field: Secondary | ICD-10-CM | POA: Diagnosis not present

## 2016-09-23 DIAGNOSIS — R0600 Dyspnea, unspecified: Secondary | ICD-10-CM

## 2016-09-23 DIAGNOSIS — Z791 Long term (current) use of non-steroidal anti-inflammatories (NSAID): Secondary | ICD-10-CM

## 2016-09-23 DIAGNOSIS — E1149 Type 2 diabetes mellitus with other diabetic neurological complication: Secondary | ICD-10-CM | POA: Diagnosis not present

## 2016-09-23 DIAGNOSIS — J90 Pleural effusion, not elsewhere classified: Secondary | ICD-10-CM | POA: Diagnosis not present

## 2016-09-23 DIAGNOSIS — F419 Anxiety disorder, unspecified: Secondary | ICD-10-CM | POA: Diagnosis present

## 2016-09-23 DIAGNOSIS — Z6820 Body mass index (BMI) 20.0-20.9, adult: Secondary | ICD-10-CM | POA: Diagnosis not present

## 2016-09-23 DIAGNOSIS — R740 Nonspecific elevation of levels of transaminase and lactic acid dehydrogenase [LDH]: Secondary | ICD-10-CM | POA: Diagnosis not present

## 2016-09-23 DIAGNOSIS — E8770 Fluid overload, unspecified: Secondary | ICD-10-CM | POA: Diagnosis not present

## 2016-09-23 DIAGNOSIS — D649 Anemia, unspecified: Secondary | ICD-10-CM | POA: Diagnosis present

## 2016-09-23 DIAGNOSIS — R7401 Elevation of levels of liver transaminase levels: Secondary | ICD-10-CM | POA: Diagnosis present

## 2016-09-23 DIAGNOSIS — Z681 Body mass index (BMI) 19 or less, adult: Secondary | ICD-10-CM | POA: Diagnosis not present

## 2016-09-23 DIAGNOSIS — R748 Abnormal levels of other serum enzymes: Secondary | ICD-10-CM | POA: Diagnosis not present

## 2016-09-23 DIAGNOSIS — A419 Sepsis, unspecified organism: Principal | ICD-10-CM | POA: Diagnosis present

## 2016-09-23 DIAGNOSIS — I700 Atherosclerosis of aorta: Secondary | ICD-10-CM | POA: Diagnosis present

## 2016-09-23 DIAGNOSIS — N3090 Cystitis, unspecified without hematuria: Secondary | ICD-10-CM | POA: Diagnosis present

## 2016-09-23 DIAGNOSIS — M1991 Primary osteoarthritis, unspecified site: Secondary | ICD-10-CM | POA: Diagnosis present

## 2016-09-23 DIAGNOSIS — E871 Hypo-osmolality and hyponatremia: Secondary | ICD-10-CM | POA: Diagnosis present

## 2016-09-23 DIAGNOSIS — E118 Type 2 diabetes mellitus with unspecified complications: Secondary | ICD-10-CM | POA: Diagnosis not present

## 2016-09-23 DIAGNOSIS — I10 Essential (primary) hypertension: Secondary | ICD-10-CM | POA: Diagnosis not present

## 2016-09-23 DIAGNOSIS — Z79891 Long term (current) use of opiate analgesic: Secondary | ICD-10-CM | POA: Diagnosis not present

## 2016-09-23 DIAGNOSIS — R945 Abnormal results of liver function studies: Secondary | ICD-10-CM

## 2016-09-23 DIAGNOSIS — E1165 Type 2 diabetes mellitus with hyperglycemia: Secondary | ICD-10-CM | POA: Diagnosis not present

## 2016-09-23 DIAGNOSIS — R7989 Other specified abnormal findings of blood chemistry: Secondary | ICD-10-CM | POA: Diagnosis not present

## 2016-09-23 DIAGNOSIS — Z853 Personal history of malignant neoplasm of breast: Secondary | ICD-10-CM | POA: Diagnosis not present

## 2016-09-23 DIAGNOSIS — R338 Other retention of urine: Secondary | ICD-10-CM | POA: Diagnosis not present

## 2016-09-23 DIAGNOSIS — N390 Urinary tract infection, site not specified: Secondary | ICD-10-CM | POA: Diagnosis not present

## 2016-09-23 DIAGNOSIS — I340 Nonrheumatic mitral (valve) insufficiency: Secondary | ICD-10-CM | POA: Diagnosis not present

## 2016-09-23 DIAGNOSIS — D72829 Elevated white blood cell count, unspecified: Secondary | ICD-10-CM | POA: Diagnosis not present

## 2016-09-23 DIAGNOSIS — N3000 Acute cystitis without hematuria: Secondary | ICD-10-CM | POA: Diagnosis not present

## 2016-09-23 DIAGNOSIS — E43 Unspecified severe protein-calorie malnutrition: Secondary | ICD-10-CM | POA: Diagnosis not present

## 2016-09-23 DIAGNOSIS — R6520 Severe sepsis without septic shock: Secondary | ICD-10-CM | POA: Diagnosis not present

## 2016-09-23 LAB — COMPREHENSIVE METABOLIC PANEL
ALT: 78 U/L — ABNORMAL HIGH (ref 14–54)
AST: 181 U/L — ABNORMAL HIGH (ref 15–41)
Albumin: 3 g/dL — ABNORMAL LOW (ref 3.5–5.0)
Alkaline Phosphatase: 125 U/L (ref 38–126)
Anion gap: 11 (ref 5–15)
BUN: 47 mg/dL — ABNORMAL HIGH (ref 6–20)
CO2: 20 mmol/L — ABNORMAL LOW (ref 22–32)
Calcium: 9.4 mg/dL (ref 8.9–10.3)
Chloride: 97 mmol/L — ABNORMAL LOW (ref 101–111)
Creatinine, Ser: 2.32 mg/dL — ABNORMAL HIGH (ref 0.44–1.00)
GFR calc Af Amer: 21 mL/min — ABNORMAL LOW (ref >60)
GFR calc non Af Amer: 18 mL/min — ABNORMAL LOW (ref >60)
Glucose, Bld: 101 mg/dL — ABNORMAL HIGH (ref 65–99)
Potassium: 4.2 mmol/L (ref 3.5–5.1)
Sodium: 128 mmol/L — ABNORMAL LOW (ref 135–145)
Total Bilirubin: 0.9 mg/dL (ref 0.3–1.2)
Total Protein: 6.9 g/dL (ref 6.5–8.1)

## 2016-09-23 LAB — CBC WITH DIFFERENTIAL/PLATELET
Basophils Absolute: 0 10*3/uL (ref 0.0–0.1)
Basophils Relative: 0 %
Eosinophils Absolute: 0.1 10*3/uL (ref 0.0–0.7)
Eosinophils Relative: 1 %
HCT: 31.4 % — ABNORMAL LOW (ref 36.0–46.0)
Hemoglobin: 10.3 g/dL — ABNORMAL LOW (ref 12.0–15.0)
Lymphocytes Relative: 25 %
Lymphs Abs: 5.4 10*3/uL — ABNORMAL HIGH (ref 0.7–4.0)
MCH: 28.2 pg (ref 26.0–34.0)
MCHC: 32.8 g/dL (ref 30.0–36.0)
MCV: 86 fL (ref 78.0–100.0)
Monocytes Absolute: 1.3 10*3/uL — ABNORMAL HIGH (ref 0.1–1.0)
Monocytes Relative: 6 %
Neutro Abs: 14.7 10*3/uL — ABNORMAL HIGH (ref 1.7–7.7)
Neutrophils Relative %: 68 %
Platelets: 161 10*3/uL (ref 150–400)
RBC: 3.65 MIL/uL — ABNORMAL LOW (ref 3.87–5.11)
RDW: 13.5 % (ref 11.5–15.5)
WBC: 21.5 10*3/uL — ABNORMAL HIGH (ref 4.0–10.5)

## 2016-09-23 LAB — URINALYSIS, ROUTINE W REFLEX MICROSCOPIC
Bilirubin Urine: NEGATIVE
Glucose, UA: NEGATIVE mg/dL
Ketones, ur: NEGATIVE mg/dL
Nitrite: NEGATIVE
Protein, ur: 100 mg/dL — AB
Specific Gravity, Urine: 1.01 (ref 1.005–1.030)
pH: 5 (ref 5.0–8.0)

## 2016-09-23 LAB — GLUCOSE, CAPILLARY: Glucose-Capillary: 77 mg/dL (ref 65–99)

## 2016-09-23 LAB — HEMOGLOBIN A1C
Hgb A1c MFr Bld: 7.4 % — ABNORMAL HIGH (ref 4.8–5.6)
Mean Plasma Glucose: 165.68 mg/dL

## 2016-09-23 LAB — I-STAT CG4 LACTIC ACID, ED: Lactic Acid, Venous: 1.68 mmol/L (ref 0.5–1.9)

## 2016-09-23 LAB — CREATININE, URINE, RANDOM: Creatinine, Urine: 67.69 mg/dL

## 2016-09-23 LAB — TSH: TSH: 4.778 u[IU]/mL — ABNORMAL HIGH (ref 0.350–4.500)

## 2016-09-23 LAB — PROTIME-INR
INR: 1.07
Prothrombin Time: 14 seconds (ref 11.4–15.2)

## 2016-09-23 LAB — SODIUM, URINE, RANDOM: Sodium, Ur: 49 mmol/L

## 2016-09-23 MED ORDER — ALPRAZOLAM 0.5 MG PO TABS
0.2500 mg | ORAL_TABLET | Freq: Two times a day (BID) | ORAL | Status: DC | PRN
  Administered 2016-09-25: 0.25 mg via ORAL
  Filled 2016-09-23: qty 1

## 2016-09-23 MED ORDER — HYDROCODONE-ACETAMINOPHEN 5-325 MG PO TABS: 1.0000 | ORAL_TABLET | ORAL | Status: DC | PRN

## 2016-09-23 MED ORDER — DULOXETINE HCL 60 MG PO CPEP
60.0000 mg | ORAL_CAPSULE | Freq: Every day | ORAL | Status: DC
  Administered 2016-09-24 – 2016-09-28 (×5): 60 mg via ORAL
  Filled 2016-09-23 (×5): qty 1

## 2016-09-23 MED ORDER — CHOLECALCIFEROL 10 MCG (400 UNIT) PO TABS
400.0000 [IU] | ORAL_TABLET | Freq: Two times a day (BID) | ORAL | Status: DC
  Administered 2016-09-24 – 2016-09-28 (×9): 400 [IU] via ORAL
  Filled 2016-09-23 (×11): qty 1

## 2016-09-23 MED ORDER — SODIUM CHLORIDE 0.9 % IV SOLN: INTRAVENOUS | Status: DC

## 2016-09-23 MED ORDER — BUPROPION HCL ER (XL) 150 MG PO TB24
150.0000 mg | ORAL_TABLET | Freq: Every day | ORAL | Status: DC
  Administered 2016-09-24 – 2016-09-28 (×5): 150 mg via ORAL
  Filled 2016-09-23 (×5): qty 1

## 2016-09-23 MED ORDER — HEPARIN SODIUM (PORCINE) 5000 UNIT/ML IJ SOLN
5000.0000 [IU] | Freq: Three times a day (TID) | INTRAMUSCULAR | Status: DC
  Administered 2016-09-23 – 2016-09-27 (×13): 5000 [IU] via SUBCUTANEOUS
  Filled 2016-09-23 (×13): qty 1

## 2016-09-23 MED ORDER — BISACODYL 5 MG PO TBEC
5.0000 mg | DELAYED_RELEASE_TABLET | Freq: Every day | ORAL | Status: DC | PRN
  Filled 2016-09-23: qty 1

## 2016-09-23 MED ORDER — SENNOSIDES-DOCUSATE SODIUM 8.6-50 MG PO TABS
1.0000 | ORAL_TABLET | Freq: Every evening | ORAL | Status: DC | PRN
  Filled 2016-09-23: qty 1

## 2016-09-23 MED ORDER — TRIAMCINOLONE ACETONIDE 0.5 % EX CREA
1.0000 "application " | TOPICAL_CREAM | Freq: Two times a day (BID) | CUTANEOUS | Status: DC
  Administered 2016-09-23 – 2016-09-27 (×9): 1 via TOPICAL
  Filled 2016-09-23: qty 15

## 2016-09-23 MED ORDER — CARVEDILOL 12.5 MG PO TABS
12.5000 mg | ORAL_TABLET | Freq: Two times a day (BID) | ORAL | Status: DC
  Filled 2016-09-23: qty 1

## 2016-09-23 MED ORDER — CYCLOSPORINE 0.05 % OP EMUL
1.0000 [drp] | Freq: Three times a day (TID) | OPHTHALMIC | Status: DC
  Administered 2016-09-23 – 2016-09-28 (×15): 1 [drp] via OPHTHALMIC
  Filled 2016-09-23 (×17): qty 1

## 2016-09-23 MED ORDER — AMLODIPINE BESYLATE 5 MG PO TABS: 5.0000 mg | ORAL_TABLET | Freq: Every day | ORAL | Status: DC

## 2016-09-23 MED ORDER — HYOSCYAMINE SULFATE ER 0.375 MG PO TB12
0.3750 mg | ORAL_TABLET | Freq: Two times a day (BID) | ORAL | Status: DC | PRN
  Filled 2016-09-23: qty 1

## 2016-09-23 MED ORDER — ONDANSETRON HCL 4 MG PO TABS: 4.0000 mg | ORAL_TABLET | Freq: Four times a day (QID) | ORAL | Status: DC | PRN

## 2016-09-23 MED ORDER — ASPIRIN EC 81 MG PO TBEC
81.0000 mg | DELAYED_RELEASE_TABLET | Freq: Every day | ORAL | Status: DC
  Administered 2016-09-24 – 2016-09-28 (×5): 81 mg via ORAL
  Filled 2016-09-23 (×5): qty 1

## 2016-09-23 MED ORDER — ACETAMINOPHEN 650 MG RE SUPP: 650.0000 mg | Freq: Four times a day (QID) | RECTAL | Status: DC | PRN

## 2016-09-23 MED ORDER — PANTOPRAZOLE SODIUM 40 MG PO TBEC
40.0000 mg | DELAYED_RELEASE_TABLET | Freq: Every day | ORAL | Status: DC
Start: 2016-09-24 — End: 2016-09-24
  Administered 2016-09-24: 40 mg via ORAL
  Filled 2016-09-23: qty 1

## 2016-09-23 MED ORDER — SODIUM CHLORIDE 0.9 % IV BOLUS (SEPSIS)
500.0000 mL | Freq: Once | INTRAVENOUS | Status: AC
  Administered 2016-09-23: 500 mL via INTRAVENOUS

## 2016-09-23 MED ORDER — ACETAMINOPHEN 325 MG PO TABS
650.0000 mg | ORAL_TABLET | Freq: Four times a day (QID) | ORAL | Status: DC | PRN
  Administered 2016-09-23 – 2016-09-27 (×6): 650 mg via ORAL
  Filled 2016-09-23 (×6): qty 2

## 2016-09-23 MED ORDER — INSULIN ASPART 100 UNIT/ML ~~LOC~~ SOLN
0.0000 [IU] | Freq: Every day | SUBCUTANEOUS | Status: DC
Start: 2016-09-23 — End: 2016-09-24

## 2016-09-23 MED ORDER — ROSUVASTATIN CALCIUM 10 MG PO TABS
10.0000 mg | ORAL_TABLET | Freq: Every day | ORAL | Status: DC
  Administered 2016-09-24 – 2016-09-28 (×5): 10 mg via ORAL
  Filled 2016-09-23 (×4): qty 1

## 2016-09-23 MED ORDER — INSULIN ASPART 100 UNIT/ML ~~LOC~~ SOLN
0.0000 [IU] | Freq: Three times a day (TID) | SUBCUTANEOUS | Status: DC
  Administered 2016-09-24 (×2): 2 [IU] via SUBCUTANEOUS
  Administered 2016-09-25: 1 [IU] via SUBCUTANEOUS
  Administered 2016-09-25: 2 [IU] via SUBCUTANEOUS
  Administered 2016-09-25: 3 [IU] via SUBCUTANEOUS
  Administered 2016-09-26 (×2): 2 [IU] via SUBCUTANEOUS
  Administered 2016-09-26 – 2016-09-27 (×2): 1 [IU] via SUBCUTANEOUS
  Administered 2016-09-27: 2 [IU] via SUBCUTANEOUS
  Administered 2016-09-27: 5 [IU] via SUBCUTANEOUS
  Administered 2016-09-28: 2 [IU] via SUBCUTANEOUS
  Administered 2016-09-28 (×2): 3 [IU] via SUBCUTANEOUS

## 2016-09-23 MED ORDER — ENSURE ENLIVE PO LIQD
237.0000 mL | Freq: Two times a day (BID) | ORAL | Status: DC
  Administered 2016-09-24 – 2016-09-26 (×4): 237 mL via ORAL

## 2016-09-23 MED ORDER — CYCLOBENZAPRINE HCL 10 MG PO TABS: 5.0000 mg | ORAL_TABLET | Freq: Three times a day (TID) | ORAL | Status: DC | PRN

## 2016-09-23 MED ORDER — VITAMIN E 180 MG (400 UNIT) PO CAPS
800.0000 [IU] | ORAL_CAPSULE | Freq: Every day | ORAL | Status: DC
  Administered 2016-09-24 – 2016-09-28 (×5): 800 [IU] via ORAL
  Filled 2016-09-23 (×5): qty 2

## 2016-09-23 MED ORDER — MIRABEGRON ER 25 MG PO TB24
25.0000 mg | ORAL_TABLET | Freq: Every day | ORAL | Status: DC
  Administered 2016-09-24 – 2016-09-28 (×5): 25 mg via ORAL
  Filled 2016-09-23 (×5): qty 1

## 2016-09-23 MED ORDER — ONDANSETRON HCL 4 MG/2ML IJ SOLN: 4.0000 mg | Freq: Four times a day (QID) | INTRAMUSCULAR | Status: DC | PRN

## 2016-09-23 MED ORDER — SODIUM CHLORIDE 0.9 % IV SOLN
INTRAVENOUS | Status: AC
  Administered 2016-09-23 – 2016-09-24 (×2): via INTRAVENOUS

## 2016-09-23 MED ORDER — PREGABALIN 25 MG PO CAPS
75.0000 mg | ORAL_CAPSULE | Freq: Two times a day (BID) | ORAL | Status: DC
  Administered 2016-09-23 – 2016-09-28 (×10): 75 mg via ORAL
  Filled 2016-09-23 (×10): qty 3

## 2016-09-23 NOTE — ED Provider Notes (Signed)
[facility] DEPT Provider Note   CSN: [id number] Arrival date & time: 09/23/16  1524     History   Chief Complaint Chief Complaint  Patient presents with  . Dehydration    HPI Deanna [name] is a 83 y.o. female.  83 year old female presents complaining of diffuse weakness. No fever or chills. No vomiting or diarrhea. Patient states that she just feels out of it. Was on a diuretic but that was stopped 2 weeks ago. Went see her Dr. today and I reviewed that visit and she WAS orthostatic and have blood work that showed evidence of acute kidney injury. Was sent here for further management.      Past Medical History:  Diagnosis Date  . Arthritis    all over  . Cancer ([name])    breast  . Depression   . Diabetes mellitus without complication (HCC)    diet controlled  . Hypertension     Patient Active Problem List   Diagnosis Date Noted  . Chronic midline low back pain without sciatica 01/13/2016    Past Surgical History:  Procedure Laterality Date  . APPENDECTOMY     with lower back surgery  . BACK SURGERY     x2  . BREAST SURGERY Left 1995   complete left mastectomy  . CERVICAL SPINE SURGERY    . EYE SURGERY Bilateral    cataracts  . TONSILLECTOMY     at age 72    OB History    No data available       Home Medications    Prior to Admission medications   Medication Sig Start Date End Date Taking? Authorizing Provider  Acetaminophen (TYLENOL ARTHRITIS PAIN PO) Take 650 mg by mouth 2 (two) times daily.    [provider]  ALPRAZolam Duanne Moron) 0.25 MG tablet Take 0.25 mg by mouth 2 (two) times daily as needed for anxiety.    [provider]  amLODipine-valsartan (EXFORGE) 10-320 MG per tablet Take 1 tablet by mouth daily.    [provider]  aspirin EC 81 MG tablet Take 81 mg by mouth daily.    [provider]  atenolol (TENORMIN) 50 MG tablet Take 50 mg by mouth 2 (two) times daily.    [provider]    buPROPion (WELLBUTRIN XL) 150 MG 24 hr tablet  10/27/15   [provider]  carvedilol (COREG) 12.5 MG tablet  01/07/16   [provider]  celecoxib (CELEBREX) 100 MG capsule Take 1 capsule (100 mg total) by mouth 2 (two) times daily as needed. 01/07/16   Gerda Diss, DO  Cyanocobalamin (VITAMIN B 12 PO) Take 1,000 mcg by mouth daily.    [provider]  denosumab (PROLIA) 60 MG/ML SOLN injection Inject 60 mg into the skin every 6 (six) months. Administer in upper arm, thigh, or abdomen    [provider]  DULoxetine (CYMBALTA) 60 MG capsule  11/14/15   [provider]  DULoxetine HCl (CYMBALTA PO) Take 60 mg by mouth daily.    [provider]  esomeprazole (NEXIUM) 40 MG capsule Take 40 mg by mouth daily at 12 noon.    [provider]  EXFORGE 5-160 MG tablet  01/06/16   [provider]  Hyoscyamine Sulfate 0.375 MG TBCR Take 1 tablet by mouth every 12 (twelve) hours as needed.    [provider]  LYRICA 75 MG capsule  11/03/15   [provider]  MYRBETRIQ 25 MG TB24 tablet  10/21/15   [provider]  rosuvastatin (CRESTOR) 10 MG tablet Take 10 mg by mouth daily.    [provider]  spironolactone (ALDACTONE) 25 MG tablet Take 25 mg by mouth daily.    [provider]  traMADol (ULTRAM) 50 MG tablet Take 50 mg by mouth every 8 (eight) hours as needed.    [provider]    Family History No family history on file.  Social History Social History  Substance Use Topics  . Smoking status: Never Smoker  . Smokeless tobacco: Never Used  . Alcohol use Not on file     Allergies   Codeine   Review of Systems Review of Systems  All other systems reviewed and are negative.    Physical Exam Updated Vital Signs BP 118/65   Pulse 68   Temp 98.4 F (36.9 C) (Oral)   Resp 16   Wt 50.2 kg (110 lb 9 oz)   SpO2 99%   BMI 20.39 kg/m   Physical Exam   Constitutional: She is oriented to person, place, and time. She appears well-developed and well-nourished.  Non-toxic appearance. No distress.  HENT:  Head: Normocephalic and atraumatic.  Eyes: Pupils are equal, round, and reactive to light. Conjunctivae, EOM and lids are normal.  Neck: Normal range of motion. Neck supple. No tracheal deviation present. No thyroid mass present.  Cardiovascular: Normal rate, regular rhythm and normal heart sounds.  Exam reveals no gallop.   No murmur heard. Pulmonary/Chest: Effort normal and breath sounds normal. No stridor. No respiratory distress. She has no decreased breath sounds. She has no wheezes. She has no rhonchi. She has no rales.  Abdominal: Soft. Normal appearance and bowel sounds are normal. She exhibits no distension. There is no tenderness. There is no rebound and no CVA tenderness.  Musculoskeletal: Normal range of motion. She exhibits no edema or tenderness.  Neurological: She is alert and oriented to person, place, and time. She has normal strength. No cranial nerve deficit or sensory deficit. GCS eye subscore is 4. GCS verbal subscore is 5. GCS motor subscore is 6.  Skin: Skin is warm and dry. No abrasion and no rash noted.  Psychiatric: She has a normal mood and affect. Her speech is normal and behavior is normal.  Nursing note and vitals reviewed.    ED Treatments / Results  Labs (all labs ordered are listed, but only abnormal results are displayed) Labs Reviewed  COMPREHENSIVE METABOLIC PANEL - Abnormal; Notable for the following:       Result Value   Sodium 128 (*)    Chloride 97 (*)    CO2 20 (*)    Glucose, Bld 101 (*)    BUN 47 (*)    Creatinine, Ser 2.32 (*)    Albumin 3.0 (*)    AST 181 (*)    ALT 78 (*)    GFR calc non Af Amer 18 (*)    GFR calc Af Amer 21 (*)    All other components within normal limits  CBC WITH DIFFERENTIAL/PLATELET - Abnormal; Notable for the following:    WBC 21.5 (*)    RBC 3.65 (*)     Hemoglobin 10.3 (*)    HCT 31.4 (*)    Neutro Abs 14.7 (*)    Lymphs Abs 5.4 (*)    Monocytes Absolute 1.3 (*)    All other components within normal limits  PROTIME-INR  URINALYSIS, ROUTINE W REFLEX MICROSCOPIC  I-STAT CG4 LACTIC ACID, ED  I-STAT CG4 LACTIC ACID, ED    EKG  EKG Interpretation None       Radiology Dg Chest 2 View  Result Date: 09/23/2016 CLINICAL DATA:  83 year old female with history weakness and fatigue for the past 4 days. EXAM: CHEST  2 VIEW COMPARISON:  Chest x-ray 04/13/2006. FINDINGS: Lung volumes are normal. No consolidative airspace disease. Trace left pleural effusion. No right pleural effusion. No pneumothorax. No pulmonary nodule or mass noted. Pulmonary vasculature and the cardiomediastinal silhouette are within normal limits. Atherosclerosis in the thoracic aorta. Status post left mastectomy and left axillary lymph node dissection. Orthopedic fixation hardware in the lower cervical spine. IMPRESSION: 1. Trace left pleural effusion. 2. Aortic atherosclerosis. Electronically Signed   By: Vinnie Langton M.D.   On: 09/23/2016 16:41    Procedures Procedures (including critical care time)  Medications Ordered in ED Medications  0.9 %  sodium chloride infusion (not administered)  sodium chloride 0.9 % bolus 500 mL (not administered)     Initial Impression / Assessment and Plan / ED Course  I have reviewed the triage vital signs and the nursing notes.  Pertinent labs & imaging results that were available during my care of the patient were reviewed by me and considered in my medical decision making (see chart for details).     Evidence of acute kidney injury. Will give IV hydration and admitted for observation.  Final Clinical Impressions(s) / ED Diagnoses   Final diagnoses:  None    New Prescriptions New Prescriptions   No medications on file     Lacretia Leigh, MD 09/23/16 [phone]

## 2016-09-23 NOTE — H&P (Signed)
History and Physical    Deanna [name] TML:[id number] DOB: 05-Sep-1930 DOA: 09/23/2016  PCP: Marton Redwood, MD   Patient coming from: Home  Chief Complaint: malaise, loss of appetite, lightheadedness on standing   HPI: Deanna [name] is a 83 y.o. female with medical history significant for depression, anxiety, hypertension, and type 2 diabetes mellitus, now presenting to the emergency department at the direction of her PCP for evaluation of possible appetite with malaise and lightheadedness upon standing. Patient reports the insidious development of poor appetite and general sense of malaise, occurring over the past week or so. She has continued to drink water and tea, but has not eaten much of anything over this interval. She has become progressively weak and lightheaded upon standing and saw her PCP today for evaluation. She was noted to have a systolic blood pressure of 90 while seated, dropping to 70 upon standing. She was directed to the ED for further evaluation. She denies any chest pain, palpitations, shortness of breath, or cough. She also denies abdominal pain, nausea, vomiting, or diarrhea. She endorses a mild headache, but denies any change in vision or hearing, and denies any focal weakness. She reports that she had been on a diuretic but it was stopped 2 weeks ago by her PCP. Denies any swelling nor dyspnea. No orthopnea.  ED Course: Upon arrival to the ED, patient is found to be afebrile, saturating well on room air, and with vital signs stable. Chest x-ray is notable for a trace pleural effusion. Chemistry panel reveals a sodium of 128, BUN 47, creatinine of 2.32, up from 1.3 in July 2018, and elevation in transaminases with AST of 181 and ALT 78. CBC is notable for a leukocytosis to 21,500 and normocytic anemia with hemoglobin of 10.3. Lactic acid is reassuring at 1.68 and INR is normal. Urinalysis is pending. Patient was given 500 mL normal saline in the ED. She remained  hemodynamically stable and in no apparent respiratory distress, and will be admitted to the medical surgical unit for ongoing evaluation and management of loss of appetite with dehydration, acute kidney injury, and orthostasis.  Review of Systems:  All other systems reviewed and apart from HPI, are negative.  Past Medical History:  Diagnosis Date  . Arthritis    all over  . Cancer ([place])    breast  . Depression   . Diabetes mellitus without complication (HCC)    diet controlled  . Hypertension     Past Surgical History:  Procedure Laterality Date  . APPENDECTOMY     with lower back surgery  . BACK SURGERY     x2  . BREAST SURGERY Left 1995   complete left mastectomy  . CERVICAL SPINE SURGERY    . EYE SURGERY Bilateral    cataracts  . TONSILLECTOMY     at age 5     reports that she has never smoked. She has never used smokeless tobacco. Her alcohol and drug histories are not on file.  Allergies  Allergen Reactions  . Codeine Nausea Only    History reviewed. No pertinent family history.   Prior to Admission medications   Medication Sig Start Date End Date Taking? Authorizing Provider  acetaminophen (TYLENOL) 500 MG tablet Take 1,000 mg by mouth daily.   Yes [provider]  ALPRAZolam (XANAX) 0.25 MG tablet Take 0.25 mg by mouth 2 (two) times daily as needed for anxiety.   Yes [provider]  aspirin EC 81 MG tablet Take 81  mg by mouth daily.   Yes [provider]  buPROPion (WELLBUTRIN XL) 150 MG 24 hr tablet Take 150 mg by mouth daily.  10/27/15  Yes [provider]  carvedilol (COREG) 12.5 MG tablet Take 12.5 mg by mouth 2 (two) times daily with a meal.  01/07/16  Yes [provider]  cyclobenzaprine (FLEXERIL) 5 MG tablet Take 5 mg by mouth every 8 (eight) hours as needed for muscle spasms.   Yes [provider]  DULoxetine HCl (CYMBALTA PO) Take 60 mg by mouth daily.   Yes [provider]    esomeprazole (NEXIUM) 40 MG capsule Take 40 mg by mouth daily at 12 noon.   Yes [provider]  Hyoscyamine Sulfate 0.375 MG TBCR Take 1 tablet by mouth every 12 (twelve) hours as needed.   Yes [provider]  LYRICA 75 MG capsule Take 75 mg by mouth 2 (two) times daily.  11/03/15  Yes [provider]  metFORMIN (GLUCOPHAGE) 500 MG tablet Take 500 mg by mouth 2 (two) times daily. 06/29/16  Yes [provider]  rosuvastatin (CRESTOR) 10 MG tablet Take 10 mg by mouth at bedtime.    Yes [provider]  traMADol (ULTRAM) 50 MG tablet Take 50 mg by mouth every 8 (eight) hours as needed.   Yes [provider]  Vitamin D, Cholecalciferol, 400 units TABS Take 400 Units by mouth daily.   Yes [provider]  vitamin E 400 UNIT capsule Take 800 Units by mouth daily.   Yes [provider]  Acetaminophen (TYLENOL ARTHRITIS PAIN PO) Take 650 mg by mouth at bedtime.     [provider]  amLODipine-valsartan (EXFORGE) 5-160 MG tablet Take 1 tablet by mouth daily.    [provider]  celecoxib (CELEBREX) 100 MG capsule Take 1 capsule (100 mg total) by mouth 2 (two) times daily as needed. 01/07/16   Gerda Diss, DO  Cyanocobalamin (VITAMIN B 12 PO) Take 1,000 mcg by mouth daily.    [provider]  denosumab (PROLIA) 60 MG/ML SOLN injection Inject 60 mg into the skin every 6 (six) months. Administer in upper arm, thigh, or abdomen    [provider]  MYRBETRIQ 25 MG TB24 tablet Take 25 mg by mouth daily.  10/21/15   [provider]    Physical Exam: Vitals:   09/23/16 1544 09/23/16 1940 09/23/16 2015 09/23/16 2030  BP: (!) 112/50 118/65 (!) 119/57 114/65  Pulse: 68  75 75  Resp: 16 16 (!) 22 (!) 27  Temp: 98.4 F (36.9 C)     TempSrc: Oral     SpO2: 99%  100% 100%  Weight:          Constitutional: NAD, calm, frail Eyes: PERTLA, lids and conjunctivae normal ENMT: Mucous membranes  are moist. Posterior pharynx clear of any exudate or lesions.   Neck: normal, supple, no masses, no thyromegaly Respiratory: clear to auscultation bilaterally, no wheezing, no crackles. Normal respiratory effort.  Cardiovascular: S1 & S2 heard, regular rate and rhythm. No extremity edema. No significant JVD. Abdomen: No distension, no tenderness, soft. Bowel sounds active.  Musculoskeletal: no clubbing / cyanosis. No joint deformity upper and lower extremities.   Skin: no significant rashes, lesions, ulcers. Warm, dry, well-perfused. Poor turgor. Neurologic: CN 2-12 grossly intact. Sensation intact, DTR normal. Strength 5/5 in all 4 limbs.  Psychiatric: Alert and oriented x 3. Calm, cooperative.     Labs on Admission: I have personally  reviewed following labs and imaging studies  CBC:  Recent Labs Lab 09/23/16 1553  WBC 21.5*  NEUTROABS 14.7*  HGB 10.3*  HCT 31.4*  MCV 86.0  PLT 130   Basic Metabolic Panel:  Recent Labs Lab 09/23/16 1553  NA 128*  K 4.2  CL 97*  CO2 20*  GLUCOSE 101*  BUN 47*  CREATININE 2.32*  CALCIUM 9.4   GFR: CrCl cannot be calculated (Unknown ideal weight.). Liver Function Tests:  Recent Labs Lab 09/23/16 1553  AST 181*  ALT 78*  ALKPHOS 125  BILITOT 0.9  PROT 6.9  ALBUMIN 3.0*   No results for input(s): LIPASE, AMYLASE in the last 168 hours. No results for input(s): AMMONIA in the last 168 hours. Coagulation Profile:  Recent Labs Lab 09/23/16 1553  INR 1.07   Cardiac Enzymes: No results for input(s): CKTOTAL, CKMB, CKMBINDEX, TROPONINI in the last 168 hours. BNP (last 3 results) No results for input(s): PROBNP in the last 8760 hours. HbA1C: No results for input(s): HGBA1C in the last 72 hours. CBG: No results for input(s): GLUCAP in the last 168 hours. Lipid Profile: No results for input(s): CHOL, HDL, LDLCALC, TRIG, CHOLHDL, LDLDIRECT in the last 72 hours. Thyroid Function Tests: No results for input(s): TSH, T4TOTAL,  FREET4, T3FREE, THYROIDAB in the last 72 hours. Anemia Panel: No results for input(s): VITAMINB12, FOLATE, FERRITIN, TIBC, IRON, RETICCTPCT in the last 72 hours. Urine analysis:    Component Value Date/Time   COLORURINE YELLOW 08/07/2006 1818   APPEARANCEUR CLEAR 08/07/2006 1818   LABSPEC 1.016 08/07/2006 1818   PHURINE 7.5 08/07/2006 1818   GLUCOSEU NEGATIVE 08/07/2006 1818   HGBUR NEGATIVE 08/07/2006 [place] 08/07/2006 [place] 08/07/2006 1818   PROTEINUR NEGATIVE 08/07/2006 1818   UROBILINOGEN 0.2 08/07/2006 1818   NITRITE NEGATIVE 08/07/2006 1818   LEUKOCYTESUR MODERATE (A) 08/07/2006 1818   Sepsis Labs: @LABRCNTIP (procalcitonin:4,lacticidven:4) )No results found for this or any previous visit (from the past 240 hour(s)).   Radiological Exams on Admission: Dg Chest 2 View  Result Date: 09/23/2016 CLINICAL DATA:  83 year old female with history weakness and fatigue for the past 4 days. EXAM: CHEST  2 VIEW COMPARISON:  Chest x-ray 04/13/2006. FINDINGS: Lung volumes are normal. No consolidative airspace disease. Trace left pleural effusion. No right pleural effusion. No pneumothorax. No pulmonary nodule or mass noted. Pulmonary vasculature and the cardiomediastinal silhouette are within normal limits. Atherosclerosis in the thoracic aorta. Status post left mastectomy and left axillary lymph node dissection. Orthopedic fixation hardware in the lower cervical spine. IMPRESSION: 1. Trace left pleural effusion. 2. Aortic atherosclerosis. Electronically Signed   By: Vinnie Langton M.D.   On: 09/23/2016 16:41    EKG: Not performed.   Assessment/Plan  1. Acute kidney injury  - SCr is 2.32 on admission, up from 1.3 in July 2018  - Suspected to be prerenal in setting of poor appetite/anorexia  - She was treated with 500 cc NS in ED  - Plan to check renal US, urine studies, continue IVF hydration, hold valsartan, and repeat chem panel in am   2.  Orthostasis  - Pt presents from PCP office where SBP was 90 while seated and 70 upon standing  - Likely secondary to hypovolemia in setting of anorexia  - Treated with 500 cc NS in ED  - Continue IVF hydration with NS, encourage oral intake    3. Hyponatremia  - Serum sodium 128 on admission in setting of hypovolemia  -  Continue IVF hydration with NS, repeat chemistries in am    4. Elevated transaminases  - AST is 181 and ALT 78 without RUQ pain or jaundice  - Check RUQ ultrasound, continue hydration, repeat CMP in am   5. Leukocytosis  - WBC is 21,500 on admission without fever  - CXR is clear and there are no urinary sxs, UA pending  - Lactic acid reassuringly normal  - Plan to culture if febrile, repeat CBC in am    6. Hypertension  - BP at goal, was low at PCP office just PTA  - Hold valsartan in light of AKI - Resume Coreg and Norvasc in am as tolerated    7. Type II DM  - No A1c on file  - Managed with metformin only at home - Check CBG's with meals and qHS - Start a low-intensity SSI with Novolog    8. Depression, anxiety  - Does not seem depressed or anxious on admission, but possible this is contributing to her poor appetite  - Continue Wellbutrin, Cymbalta, and prn Xanax    DVT prophylaxis: sq heparin  Code Status: Full  Family Communication: Daughter updated at bedside Disposition Plan: Admit to med-surg Consults called: None Admission status: Inpatient    Vianne Bulls, MD [facility] Pager [phone]  If 7PM-7AM, please contact night-coverage [email or web address] Password [facility]  09/23/2016, 8:35 PM

## 2016-09-23 NOTE — ED Notes (Signed)
Report attempted x1, Floor RN to call back

## 2016-09-23 NOTE — ED Triage Notes (Signed)
Pt here after fall out of bed Tuesday night and hit head. Daughter states pt has been "out of it" Pt taken off her diuretic 2 weeks ago at PCP. Pt went to PCP and had positive orthostatic BP in the 70s upon standing. States pt has not been eating or drinking well since fall. Small knot noted on left forehead

## 2016-09-23 NOTE — ED Notes (Signed)
Patient transported to US 

## 2016-09-24 DIAGNOSIS — E119 Type 2 diabetes mellitus without complications: Secondary | ICD-10-CM

## 2016-09-24 DIAGNOSIS — A419 Sepsis, unspecified organism: Principal | ICD-10-CM

## 2016-09-24 DIAGNOSIS — N179 Acute kidney failure, unspecified: Secondary | ICD-10-CM

## 2016-09-24 DIAGNOSIS — E860 Dehydration: Secondary | ICD-10-CM

## 2016-09-24 DIAGNOSIS — N390 Urinary tract infection, site not specified: Secondary | ICD-10-CM

## 2016-09-24 DIAGNOSIS — F418 Other specified anxiety disorders: Secondary | ICD-10-CM

## 2016-09-24 DIAGNOSIS — R7989 Other specified abnormal findings of blood chemistry: Secondary | ICD-10-CM

## 2016-09-24 LAB — COMPREHENSIVE METABOLIC PANEL
ALT: 104 U/L — ABNORMAL HIGH (ref 14–54)
AST: 202 U/L — ABNORMAL HIGH (ref 15–41)
Albumin: 2.7 g/dL — ABNORMAL LOW (ref 3.5–5.0)
Alkaline Phosphatase: 169 U/L — ABNORMAL HIGH (ref 38–126)
Anion gap: 10 (ref 5–15)
BUN: 44 mg/dL — ABNORMAL HIGH (ref 6–20)
CO2: 16 mmol/L — ABNORMAL LOW (ref 22–32)
Calcium: 8.8 mg/dL — ABNORMAL LOW (ref 8.9–10.3)
Chloride: 104 mmol/L (ref 101–111)
Creatinine, Ser: 2.03 mg/dL — ABNORMAL HIGH (ref 0.44–1.00)
GFR calc Af Amer: 25 mL/min — ABNORMAL LOW (ref >60)
GFR calc non Af Amer: 21 mL/min — ABNORMAL LOW (ref >60)
Glucose, Bld: 132 mg/dL — ABNORMAL HIGH (ref 65–99)
Potassium: 4.9 mmol/L (ref 3.5–5.1)
Sodium: 130 mmol/L — ABNORMAL LOW (ref 135–145)
Total Bilirubin: 1 mg/dL (ref 0.3–1.2)
Total Protein: 6 g/dL — ABNORMAL LOW (ref 6.5–8.1)

## 2016-09-24 LAB — CBC
HCT: 32 % — ABNORMAL LOW (ref 36.0–46.0)
Hemoglobin: 10.7 g/dL — ABNORMAL LOW (ref 12.0–15.0)
MCH: 29.1 pg (ref 26.0–34.0)
MCHC: 33.4 g/dL (ref 30.0–36.0)
MCV: 87 fL (ref 78.0–100.0)
Platelets: 143 10*3/uL — ABNORMAL LOW (ref 150–400)
RBC: 3.68 MIL/uL — ABNORMAL LOW (ref 3.87–5.11)
RDW: 13.7 % (ref 11.5–15.5)
WBC: 14.7 10*3/uL — ABNORMAL HIGH (ref 4.0–10.5)

## 2016-09-24 LAB — CBC WITH DIFFERENTIAL/PLATELET
Basophils Absolute: 0 10*3/uL (ref 0.0–0.1)
Basophils Relative: 0 %
Eosinophils Absolute: 0 10*3/uL (ref 0.0–0.7)
Eosinophils Relative: 0 %
HCT: 34.7 % — ABNORMAL LOW (ref 36.0–46.0)
Hemoglobin: 11.5 g/dL — ABNORMAL LOW (ref 12.0–15.0)
Lymphocytes Relative: 16 %
Lymphs Abs: 2.5 10*3/uL (ref 0.7–4.0)
MCH: 28.3 pg (ref 26.0–34.0)
MCHC: 33.1 g/dL (ref 30.0–36.0)
MCV: 85.3 fL (ref 78.0–100.0)
Monocytes Absolute: 0.7 10*3/uL (ref 0.1–1.0)
Monocytes Relative: 5 %
Neutro Abs: 12.6 10*3/uL — ABNORMAL HIGH (ref 1.7–7.7)
Neutrophils Relative %: 79 %
Platelets: 173 10*3/uL (ref 150–400)
RBC: 4.07 MIL/uL (ref 3.87–5.11)
RDW: 13.5 % (ref 11.5–15.5)
WBC: 15.9 10*3/uL — ABNORMAL HIGH (ref 4.0–10.5)

## 2016-09-24 LAB — GLUCOSE, CAPILLARY
Glucose-Capillary: 112 mg/dL — ABNORMAL HIGH (ref 65–99)
Glucose-Capillary: 181 mg/dL — ABNORMAL HIGH (ref 65–99)
Glucose-Capillary: 168 mg/dL — ABNORMAL HIGH (ref 65–99)
Glucose-Capillary: 146 mg/dL — ABNORMAL HIGH (ref 65–99)

## 2016-09-24 MED ORDER — FAMOTIDINE 20 MG PO TABS
20.0000 mg | ORAL_TABLET | Freq: Every day | ORAL | Status: DC
  Administered 2016-09-24 – 2016-09-28 (×5): 20 mg via ORAL
  Filled 2016-09-24 (×5): qty 1

## 2016-09-24 MED ORDER — SODIUM CHLORIDE 0.9 % IV SOLN
INTRAVENOUS | Status: DC
  Administered 2016-09-24 – 2016-09-25 (×2): via INTRAVENOUS

## 2016-09-24 MED ORDER — DEXTROSE 5 % IV SOLN
1.0000 g | INTRAVENOUS | Status: DC
  Administered 2016-09-24 – 2016-09-27 (×4): 1 g via INTRAVENOUS
  Filled 2016-09-24 (×5): qty 10

## 2016-09-24 NOTE — Progress Notes (Signed)
Bladder scan showed 657 ml. Patient states that she does not have to void. MD notified. Orders placed. Will continue to monitor.

## 2016-09-24 NOTE — Evaluation (Signed)
Physical Therapy Evaluation Patient Details Name: Deanna [name] MRN: [id number] DOB: 09-05-1930 Today's Date: 09/24/2016   History of Present Illness  Pt is a 83 yo female admitted through ED on 09/23/16 with a fall and increased confusion. Pt was diagnosed with an AKI, orthostasis, hyponatremia, and hpertransminases. PMH significant for Depression, anxiety, HTN, DM2.   Clinical Impression  Pt presents with the above diagnosis and below deficits for therapy evaluation. Prior to admission, pt lived alone in an [place] apartment and was able to drive and do everything for herself. Currently, pt requires Max A for all mobility including bed mobility and transfers with increased confusion and lethargy and fatigue. Pt will benefit from SNF at discharge in order to maximize her outcomes. Pt continues to benefit from acute PT follow-up to address the below deficits prior to D/C.     Follow Up Recommendations SNF;Supervision/Assistance - 24 hour    Equipment Recommendations  None recommended by PT    Recommendations for Other Services       Precautions / Restrictions Precautions Precautions: Fall Restrictions Weight Bearing Restrictions: No      Mobility  Bed Mobility Overal bed mobility: Needs Assistance Bed Mobility: Supine to Sit;Sit to Supine     Supine to sit: Max assist Sit to supine: Max assist   General bed mobility comments: Max A to sit upright at EOB with use of Pads to pull hips to EOB. Pt with posterior lean with sitting EOB  Transfers Overall transfer level: Needs assistance Equipment used: 1 person hand held assist Transfers: Sit to/from Stand Sit to Stand: Mod assist         General transfer comment: Mod A to stand, cues for hand placement. Very inseady in standing  Ambulation/Gait             General Gait Details: Unable to perform this session  Stairs            Wheelchair Mobility    Modified Rankin (Stroke Patients Only)       Balance  Overall balance assessment: Needs assistance Sitting-balance support: Bilateral upper extremity supported;Feet supported Sitting balance-Leahy Scale: Fair   Postural control: Posterior lean Standing balance support: Bilateral upper extremity supported Standing balance-Leahy Scale: Poor                               Pertinent Vitals/Pain Pain Assessment: Faces Faces Pain Scale: Hurts little more Pain Location: abdomen  Pain Descriptors / Indicators: Guarding Pain Intervention(s): Monitored during session;Repositioned    Home Living Family/patient expects to be discharged to:: Private residence Living Arrangements: Alone Available Help at Discharge: Family;Available PRN/intermittently Type of Home: Independent living facility Home Access: Level entry     Home Layout: One level Home Equipment: Walker - 2 wheels Additional Comments: Per son, pt lives in [place] apartment at [facility].     Prior Function Level of Independence: Independent         Comments: completely independent, driving adn doing for herself     Hand Dominance   Dominant Hand: Right    Extremity/Trunk Assessment   Upper Extremity Assessment Upper Extremity Assessment: Generalized weakness    Lower Extremity Assessment Lower Extremity Assessment: Generalized weakness    Cervical / Trunk Assessment Cervical / Trunk Assessment: Kyphotic  Communication   Communication: No difficulties  Cognition Arousal/Alertness: Lethargic Behavior During Therapy: WFL for tasks assessed/performed;Flat affect Overall Cognitive Status: Impaired/Different from baseline Area of Impairment:  Orientation;Memory;Following commands;Safety/judgement;Awareness                 Orientation Level: Disoriented to;Time;Situation   Memory: Decreased short-term memory Following Commands: Follows one step commands consistently;Follows multi-step commands inconsistently;Follows one step commands with  increased time Safety/Judgement: Decreased awareness of deficits;Decreased awareness of safety Awareness: Emergent   General Comments: Per son, pt is significantly more confused today than normal.       General Comments      Exercises     Assessment/Plan    PT Assessment Patient needs continued PT services  PT Problem List Decreased strength;Decreased activity tolerance;Decreased balance;Decreased mobility;Decreased cognition;Decreased knowledge of use of DME;Decreased safety awareness       PT Treatment Interventions DME instruction;Gait training;Functional mobility training;Therapeutic activities;Therapeutic exercise;Balance training;Patient/family education    PT Goals (Current goals can be found in the Care Plan section)  Acute Rehab PT Goals Patient Stated Goal: none stated PT Goal Formulation: With patient/family Time For Goal Achievement: 10/01/16 Potential to Achieve Goals: Good    Frequency Min 2X/week   Barriers to discharge        Co-evaluation               AM-PAC PT "6 Clicks" Daily Activity  Outcome Measure Difficulty turning over in bed (including adjusting bedclothes, sheets and blankets)?: Unable Difficulty moving from lying on back to sitting on the side of the bed? : Unable Difficulty sitting down on and standing up from a chair with arms (e.g., wheelchair, bedside commode, etc,.)?: Unable Help needed moving to and from a bed to chair (including a wheelchair)?: Total Help needed walking in hospital room?: Total Help needed climbing 3-5 steps with a railing? : Total 6 Click Score: 6    End of Session Equipment Utilized During Treatment: Gait belt Activity Tolerance: Patient limited by fatigue Patient left: in bed;with call bell/phone within reach;with family/visitor present Nurse Communication: Mobility status PT Visit Diagnosis: Unsteadiness on feet (R26.81);Muscle weakness (generalized) (M62.81);Difficulty in walking, not elsewhere  classified (R26.2)    Time: 6283-6629 PT Time Calculation (min) (ACUTE ONLY): 15 min   Charges:   PT Evaluation $PT Eval Moderate Complexity: 1 Mod     PT G Codes:        Scheryl Marten PT, DPT  [phone]   Jacqulyn Liner Sloan Leiter 09/24/2016, 3:18 PM

## 2016-09-24 NOTE — Progress Notes (Signed)
Pt laying on IV tubing. Noticed 2 red area on upper right back.. Moved and adjusted tubing a couple of times through out the night.

## 2016-09-24 NOTE — Progress Notes (Signed)
MD notified regarding dose of Norvasc at 1000. MD stated to hold. Orders followed. Will continue to monitor.

## 2016-09-24 NOTE — Progress Notes (Signed)
Initial Nutrition Assessment  DOCUMENTATION CODES:   Non-severe (moderate) malnutrition in context of acute illness/injury, Underweight  INTERVENTION:   Ensure Enlive po BID, each supplement provides 350 kcal and 20 grams of protein   NUTRITION DIAGNOSIS:   Malnutrition (Moderate) related to acute illness as evidenced by energy intake < or equal to 50% for > or equal to 5 days, mild depletion of body fat, mild depletion of muscle mass.  GOAL:   Patient will meet greater than or equal to 90% of their needs  MONITOR:   PO intake, Supplement acceptance, Weight trends, Labs  REASON FOR ASSESSMENT:   Malnutrition Screening Tool    ASSESSMENT:   83 yo female admitted with AKI. Pt with hx of HTN, DM, depression/anxiety  Pt feels very poorly on visit today, has headache. Recorded po intake 50%. Pt reports appetite is fair. Unable to elaborate very much on intake. Pt does indicate minimal intake for 1 week, able to drink water and tea but not really eat anything.   Pt reports UBW 107 pounds, current wt 106 pounds.   Nutrition-Focused physical exam completed. Findings are mild fat depletion, mild muscle depletion, and no edema.   Labs: sodium 130, Creatinine 2.03 Meds: NS at 75 ml/hr  Diet Order:  Diet regular Room service appropriate? Yes; Fluid consistency: Thin  Skin:  Reviewed, no issues  Last BM:  8/15  Height:   Ht Readings from Last 1 Encounters:  09/23/16 5\' 4"  (1.626 m)    Weight:   Wt Readings from Last 1 Encounters:  09/24/16 106 lb 0.7 oz (48.1 kg)    Ideal Body Weight:     BMI:  Body mass index is 18.2 kg/m.  Estimated Nutritional Needs:   Kcal:  1270-1420 kcals  Protein:  64-71 g  Fluid:  >/= 1.3 L  EDUCATION NEEDS:   No education needs identified at this time  [place], Kyle, LDN [phone] Pager  [phone] Weekend/On-Call Pager

## 2016-09-24 NOTE — Progress Notes (Signed)
PROGRESS NOTE    Deanna [name]  CBS:[id number] DOB: 1930-09-28 DOA: 09/23/2016 PCP: Marton Redwood, MD    Brief Narrative:  83 year old female who presented with malaise, loss of appetite, and orthostatic symptoms. Patient is known to have depression, hypertension,and  type 2 diabetes mellitus. Her symptoms have been ongoing for last week, significant decreased by mouth intake, she was evaluated by her primary care provider and found to have a systolic blood pressure of 90 when sitting, down to 70 while standing, she was referred to the hospital for further evaluation. On initial physical examination blood pressure 112/50, heart rate 68, respiration rate 16 to 22, temperature 98.4, oxygen saturation 99%. Dry mucous membranes, lungs were clear to auscultation bilaterally, heart S1-S2 present and rhythmic, abdomen was soft nontender, lower extremities no edema, skin with poor turgor. Sodium 128, potassium 4.2, chloride 97, bicarbonate 20, glucose 101, BUN 47, creatinine 2.32, AST 181, ALT 78, white count 21.5, hemoglobin 10.3, hematocrit 31.4, platelets 161. TSH 4.77, urinalysis with too numerous to count white cells, 100 protein, large leukocytes. Chest x-ray was clear for infiltrates. Abdominal ultrasound with cholelithiasis with no wall thickening. Increased renal cortical echogenicity consistent with medical renal disease.   Patient was admitted to the hospital working diagnosis of acute kidney injury, likely prerenal, complicated by sepsis due to urinary tract infection, hyponatremia, and elevated transaminases.   Assessment & Plan:   Principal Problem:   AKI (acute kidney injury) ([place]) Active Problems:   Loss of appetite   Orthostasis   Hyponatremia   Leukocytosis   Normocytic anemia   Depression with anxiety   Diabetes mellitus type II, non insulin dependent ([place])   Transaminasemia   1. Prerenal renal failure with acute kidney injury. Renal function with serum cr down to 2.0 from  2,3, will continue hydration with isotonic saline at 75 cc per hour, will follow on renal panel in am, avoid hypotension or nephrotoxic medications. Dc pantoprazole.   2. Urinary tract infection. Will continue antibiotic therapy with IV ceftriaxone, follow cell count, temperature curve and cultures.   3. Elevated transaminases. Likely related to sepsis, will continue supportive medical therapy, will follow on LFTs in am.   4. Hypertension. Blood pressure systolic 846 to 659, will continue IV fluids, will continue to hold on antihypertensive agents, amlodipine and carvedilol.   5. Type 2 diabetes mellitus. Will continue glucose cover and monitoring with iss, patient tolerating po well, capillary glucose 77, 112, 181.   6. Depression/anxiety. No confusion or agitation, deconditioned, continue bupropion, duloxetine, as needed alprazolam.   DVT prophylaxis: heparin Code Status: Full  Family Communication:  Disposition Plan:    Consultants:     Procedures:     Antimicrobials:       Subjective: Patient very weak and deconditioned, no nausea or vomiting, dizziness have improved but still present.   Objective: Vitals:   09/24/16 0457 09/24/16 0500 09/24/16 0846 09/24/16 1009  BP: 126/71  (!) 107/45 135/84  Pulse: 85  79 84  Resp: 16   16  Temp: 99.2 F (37.3 C)   98.8 F (37.1 C)  TempSrc: Oral   Oral  SpO2: 92%   95%  Weight:  48.1 kg (106 lb 0.7 oz)    Height:        Intake/Output Summary (Last 24 hours) at 09/24/16 1034 Last data filed at 09/24/16 1025  Gross per 24 hour  Intake          1796.34 ml  Output  3600 ml  Net         -1803.66 ml   Filed Weights   09/23/16 1543 09/23/16 2251 09/24/16 0500  Weight: 50.2 kg (110 lb 9 oz) 48.1 kg (106 lb 1.6 oz) 48.1 kg (106 lb 0.7 oz)    Examination:  General exam: deconditioned E ENT. Mild pallor, no icterus, oral mucosa dry.  Respiratory system: No rales, wheezing, or rhonchi. Respiratory effort  normal. Cardiovascular system: S1 & S2 heard, RRR. No JVD, murmurs, rubs, gallops or clicks. No pedal edema. Gastrointestinal system: Abdomen is nondistended, soft and nontender. No organomegaly or masses felt. Normal bowel sounds heard. Central nervous system: Alert and oriented. No focal neurological deficits. Extremities: Symmetric 5 x 5 power. Skin: No rashes, lesions or ulcers     Data Reviewed: I have personally reviewed following labs and imaging studies  CBC:  Recent Labs Lab 09/23/16 1553 09/24/16 0322  WBC 21.5* 14.7*  NEUTROABS 14.7*  --   HGB 10.3* 10.7*  HCT 31.4* 32.0*  MCV 86.0 87.0  PLT 161 784*   Basic Metabolic Panel:  Recent Labs Lab 09/23/16 1553 09/24/16 0322  NA 128* 130*  K 4.2 4.9  CL 97* 104  CO2 20* 16*  GLUCOSE 101* 132*  BUN 47* 44*  CREATININE 2.32* 2.03*  CALCIUM 9.4 8.8*   GFR: Estimated Creatinine Clearance: 15.7 mL/min (A) (by C-G formula based on SCr of 2.03 mg/dL (H)). Liver Function Tests:  Recent Labs Lab 09/23/16 1553 09/24/16 0322  AST 181* 202*  ALT 78* 104*  ALKPHOS 125 169*  BILITOT 0.9 1.0  PROT 6.9 6.0*  ALBUMIN 3.0* 2.7*   No results for input(s): LIPASE, AMYLASE in the last 168 hours. No results for input(s): AMMONIA in the last 168 hours. Coagulation Profile:  Recent Labs Lab 09/23/16 1553  INR 1.07   Cardiac Enzymes: No results for input(s): CKTOTAL, CKMB, CKMBINDEX, TROPONINI in the last 168 hours. BNP (last 3 results) No results for input(s): PROBNP in the last 8760 hours. HbA1C:  Recent Labs  09/23/16 1553  HGBA1C 7.4*   CBG:  Recent Labs Lab 09/23/16 2255 09/24/16 0803  GLUCAP 77 112*   Lipid Profile: No results for input(s): CHOL, HDL, LDLCALC, TRIG, CHOLHDL, LDLDIRECT in the last 72 hours. Thyroid Function Tests:  Recent Labs  09/23/16 2039  TSH 4.778*   Anemia Panel: No results for input(s): VITAMINB12, FOLATE, FERRITIN, TIBC, IRON, RETICCTPCT in the last 72  hours. Sepsis Labs:  Recent Labs Lab 09/23/16 1638  LATICACIDVEN 1.68    No results found for this or any previous visit (from the past 240 hour(s)).       Radiology Studies: Dg Chest 2 View  Result Date: 09/23/2016 CLINICAL DATA:  83 year old female with history weakness and fatigue for the past 4 days. EXAM: CHEST  2 VIEW COMPARISON:  Chest x-ray 04/13/2006. FINDINGS: Lung volumes are normal. No consolidative airspace disease. Trace left pleural effusion. No right pleural effusion. No pneumothorax. No pulmonary nodule or mass noted. Pulmonary vasculature and the cardiomediastinal silhouette are within normal limits. Atherosclerosis in the thoracic aorta. Status post left mastectomy and left axillary lymph node dissection. Orthopedic fixation hardware in the lower cervical spine. IMPRESSION: 1. Trace left pleural effusion. 2. Aortic atherosclerosis. Electronically Signed   By: Vinnie Langton M.D.   On: 09/23/2016 16:41   US Abdomen Complete  Result Date: 09/23/2016 CLINICAL DATA:  Acute renal failure.  Elevated LFTs. EXAM: ABDOMEN ULTRASOUND COMPLETE COMPARISON:  None. FINDINGS: Gallbladder: Cholelithiasis  is seen in the gallbladder with the largest stone measuring 7 mm. No wall thickening, pericholecystic fluid, or Murphy's sign. Common bile duct: Diameter: 4 mm Liver: No focal lesion identified. Within normal limits in parenchymal echogenicity. IVC: No abnormality visualized. Pancreas: Visualized portion unremarkable. Spleen: Size and appearance within normal limits. Right Kidney: Length: 10.5 cm.  Increased cortical echogenicity. Left Kidney: Length: 9.9 cm.  Increased cortical echogenicity. Abdominal aorta: No aneurysm visualized. Other findings: None. IMPRESSION: 1. Cholelithiasis with no wall thickening, pericholecystic fluid, or Murphy's sign. 2. Increased cortical echogenicity associated with the kidneys suggests medical renal disease. Electronically Signed   By: Dorise Bullion III  M.D   On: 09/23/2016 22:14        Scheduled Meds: . amLODipine  5 mg Oral Daily  . aspirin EC  81 mg Oral Daily  . buPROPion  150 mg Oral Daily  . carvedilol  12.5 mg Oral BID WC  . cholecalciferol  400 Units Oral BID  . cycloSPORINE  1 drop Both Eyes TID  . DULoxetine  60 mg Oral Daily  . feeding supplement (ENSURE ENLIVE)  237 mL Oral BID BM  . heparin  5,000 Units Subcutaneous Q8H  . insulin aspart  0-5 Units Subcutaneous QHS  . insulin aspart  0-9 Units Subcutaneous TID WC  . mirabegron ER  25 mg Oral Daily  . pantoprazole  40 mg Oral Daily  . pregabalin  75 mg Oral BID  . rosuvastatin  10 mg Oral q1800  . triamcinolone cream  1 application Topical BID  . vitamin E  800 Units Oral Daily   Continuous Infusions:   LOS: 1 day       Salem Lembke Gerome Apley, MD [facility] Pager [phone]  If 7PM-7AM, please contact night-coverage [email or web address] Password [facility] 09/24/2016, 10:34 AM

## 2016-09-24 NOTE — Consult Note (Signed)
[facility] CM Primary Care Navigator  09/24/2016  Deanna [name] [birth year] [id number]   Met withpatientand son Deanna [name]) at the bedside to identify possible discharge needs.  Patient reports having dizziness, weakness, confusion, feeling nauseous with loss of appetite that had led to this admission. Per son, patient resides at [facility] [employer] in [place] ([place]).  Patient endorses Dr. Marton Redwood with [facility] as the primary care provider.   Patientshared using [facility] in [place] to obtain medications without difficulty.   Patient managesherown medications with use of "pill box" system filled weekly.  Per son, patient drives prior to admission but children will be able to provide transportation her doctors' appointments after discharge.   Patient's daughter Deanna [name]) will be her caregiver after discharge,according to son.  Anticipated discharge plan is to skilled nursing facility per PT recommendation for rehabilitation. According to son, patient will be staying at daughter's house after discharge prior to going back to her apartment at [employer].  Patientand son expressed understanding to call primary care provider's officewhen shereturns back home,for a post discharge follow-up appointment within a week or sooner if needed.Patient letter (with PCP's contact number) was provided as areminder.  Explained to patient about [facility] CM services available for health management but patient denies needs or concerns at this time and son states that patient is managing her DM with diet, medication and follow-up with primary care provider.  Patient and son verbalized understanding to [facility] primary care provider to [facility] care management services ifdeemed necessary in the future once she returns back home.  [facility] care management information provided for future needs that may arise.   For  questions, please contact:  Dannielle Huh, BSN, RN- [facility] Primary Care Navigator  Telephone: [phone] [place]

## 2016-09-24 NOTE — Progress Notes (Signed)
B/P is 107/45 and HR is 79. MD notified. MD stated to hold coreg. Also, MD notified regarding patients need for in an out cath this morning prior to 0700 D/T the inability to void adequately. MD placed orders for bladder scan at 1200. Will continue to monitor.

## 2016-09-24 NOTE — Progress Notes (Signed)
New Admission Note:  Arrival Method: On stretcher from ED.  Mental Orientation: Alert & Oriented x4 Telemetry: None Assessment: Completed Skin: Refer to flowsheet IV: Right AC Pain: 5/10 Tubes: None Safety Measures: Safety Fall Prevention Plan discussed with patient. Admission: Completed [address] Orientation: Patient has been orientated to the room, unit and the staff.  Orders have been reviewed and implemented. Will continue to monitor the patient. Call light has been placed within reach and bed alarm has been activated.   Vassie Moselle, RN  Phone Number: [phone]

## 2016-09-25 ENCOUNTER — Inpatient Hospital Stay (HOSPITAL_COMMUNITY): Payer: Medicare Other

## 2016-09-25 DIAGNOSIS — R740 Nonspecific elevation of levels of transaminase and lactic acid dehydrogenase [LDH]: Secondary | ICD-10-CM

## 2016-09-25 DIAGNOSIS — E871 Hypo-osmolality and hyponatremia: Secondary | ICD-10-CM

## 2016-09-25 LAB — CBC WITH DIFFERENTIAL/PLATELET
Basophils Absolute: 0 10*3/uL (ref 0.0–0.1)
Basophils Relative: 0 %
Eosinophils Absolute: 0 10*3/uL (ref 0.0–0.7)
Eosinophils Relative: 0 %
HCT: 31.6 % — ABNORMAL LOW (ref 36.0–46.0)
Hemoglobin: 10.7 g/dL — ABNORMAL LOW (ref 12.0–15.0)
Lymphocytes Relative: 9 %
Lymphs Abs: 1.5 10*3/uL (ref 0.7–4.0)
MCH: 28.4 pg (ref 26.0–34.0)
MCHC: 33.9 g/dL (ref 30.0–36.0)
MCV: 83.8 fL (ref 78.0–100.0)
Monocytes Absolute: 1.1 10*3/uL — ABNORMAL HIGH (ref 0.1–1.0)
Monocytes Relative: 6 %
Neutro Abs: 14.1 10*3/uL — ABNORMAL HIGH (ref 1.7–7.7)
Neutrophils Relative %: 85 %
Platelets: 165 10*3/uL (ref 150–400)
RBC: 3.77 MIL/uL — ABNORMAL LOW (ref 3.87–5.11)
RDW: 13.5 % (ref 11.5–15.5)
WBC: 16.6 10*3/uL — ABNORMAL HIGH (ref 4.0–10.5)

## 2016-09-25 LAB — BLOOD GAS, ARTERIAL
Acid-base deficit: 6.7 mmol/L — ABNORMAL HIGH (ref 0.0–2.0)
Bicarbonate: 17 mmol/L — ABNORMAL LOW (ref 20.0–28.0)
Drawn by: 414221
FIO2: 40
O2 Saturation: 89.9 %
Patient temperature: 100.9
pCO2 arterial: 28.9 mmHg — ABNORMAL LOW (ref 32.0–48.0)
pH, Arterial: 7.394 (ref 7.350–7.450)
pO2, Arterial: 62.4 mmHg — ABNORMAL LOW (ref 83.0–108.0)

## 2016-09-25 LAB — BASIC METABOLIC PANEL
Anion gap: 10 (ref 5–15)
BUN: 25 mg/dL — ABNORMAL HIGH (ref 6–20)
CO2: 18 mmol/L — ABNORMAL LOW (ref 22–32)
Calcium: 9.1 mg/dL (ref 8.9–10.3)
Chloride: 110 mmol/L (ref 101–111)
Creatinine, Ser: 1.55 mg/dL — ABNORMAL HIGH (ref 0.44–1.00)
GFR calc Af Amer: 34 mL/min — ABNORMAL LOW (ref >60)
GFR calc non Af Amer: 30 mL/min — ABNORMAL LOW (ref >60)
Glucose, Bld: 178 mg/dL — ABNORMAL HIGH (ref 65–99)
Potassium: 3.8 mmol/L (ref 3.5–5.1)
Sodium: 138 mmol/L (ref 135–145)

## 2016-09-25 LAB — GLUCOSE, CAPILLARY
Glucose-Capillary: 125 mg/dL — ABNORMAL HIGH (ref 65–99)
Glucose-Capillary: 162 mg/dL — ABNORMAL HIGH (ref 65–99)
Glucose-Capillary: 173 mg/dL — ABNORMAL HIGH (ref 65–99)
Glucose-Capillary: 186 mg/dL — ABNORMAL HIGH (ref 65–99)
Glucose-Capillary: 201 mg/dL — ABNORMAL HIGH (ref 65–99)

## 2016-09-25 LAB — UREA NITROGEN, URINE: Urea Nitrogen, Ur: 265 mg/dL

## 2016-09-25 MED ORDER — FUROSEMIDE 10 MG/ML IJ SOLN
20.0000 mg | Freq: Once | INTRAMUSCULAR | Status: AC
  Administered 2016-09-25: 20 mg via INTRAVENOUS
  Filled 2016-09-25: qty 2

## 2016-09-25 NOTE — Progress Notes (Signed)
PROGRESS NOTE    Deanna [name]  IHK:[id number] DOB: 1930/12/07 DOA: 09/23/2016 PCP: Marton Redwood, MD     Brief Narrative:  83 year old female who presented with malaise, loss of appetite, and orthostatic symptoms. Patient is known to have depression, hypertension,and  type 2 diabetes mellitus. Her symptoms have been ongoing for last week, significant decreased by mouth intake, she was evaluated by her primary care provider and found to have a systolic blood pressure of 90 when sitting, down to 70 while standing, she was referred to the hospital for further evaluation. On initial physical examination blood pressure 112/50, heart rate 68, respiration rate 16 to 22, temperature 98.4, oxygen saturation 99%. Dry mucous membranes, lungs were clear to auscultation bilaterally, heart S1-S2 present and rhythmic, abdomen was soft nontender, lower extremities no edema, skin with poor turgor. Sodium 128, potassium 4.2, chloride 97, bicarbonate 20, glucose 101, BUN 47, creatinine 2.32, AST 181, ALT 78, white count 21.5, hemoglobin 10.3, hematocrit 31.4, platelets 161. TSH 4.77, urinalysis with too numerous to count white cells, 100 protein, large leukocytes. Chest x-ray was clear for infiltrates. Abdominal ultrasound with cholelithiasis with no wall thickening. Increased renal cortical echogenicity consistent with medical renal disease.   Patient was admitted to the hospital working diagnosis of acute kidney injury, likely prerenal, complicated by sepsis due to urinary tract infection, hyponatremia, and elevated transaminases.    Assessment & Plan:   Principal Problem:   AKI (acute kidney injury) ([place]) Active Problems:   Loss of appetite   Orthostasis   Hyponatremia   Leukocytosis   Normocytic anemia   Depression with anxiety   Diabetes mellitus type II, non insulin dependent ([place])   Transaminasemia   1. New pulmonary edema with acute hypoxic respiratory failure, due to volume overload. Patient  developed volume overload, personally reviewed chest film, noted increase interstitial infiltrates, and vascular congestion. Will stop IV fluids and will order furosemide 20 mg once, follow on urine output and oxymetry monitoring. Continue Ventim mask at 40%, to target oxygen saturation greater than 92%.   1. Prerenal renal failure with acute kidney injury. Serum cr down to 1.5 from 2,03 will continue to follow renal panel in am, hold on IV fluids due to volume overload, will follow urine output.    2. Urinary tract infection.  Antibiotic therapy with IV ceftriaxone, follow cell count at 16, T max at 100.9, cultures continue to be no growth.   3. Elevated transaminases. Will follow on LFTs in am, suspected to be related to hypoperfusion.   4. Hypertension. Blood pressure systolic 756 to 433 systolic, will continue to hold on IV fluids, follow response to diuresis with furosemide.  5. Type 2 diabetes mellitus. Capillary glucose 168, 146, 162, 125, 201, will continue glucose cover and monitoring with iss, patient tolerating po well.   6. Depression/anxiety. Continue bupropion, duloxetine, as needed alprazolam.   DVT prophylaxis: heparin Code Status: Full  Family Communication:  Disposition Plan:    Consultants:     Procedures:     Antimicrobials:    Ceftriaxone    Subjective: Patient developed desaturation last night, required high supplemental oxygen. This am on venti mask at 40%, with improved dyspnea, mild intensity, improved with supplemental 02, no worsening factors, no associated chest pain, persistent since last night.   Objective: Vitals:   09/25/16 0021 09/25/16 0023 09/25/16 0500 09/25/16 0622  BP:  (!) 158/70  (!) 130/58  Pulse:    91  Resp: (!) 24   16  Temp:  99.3 F (37.4 C)  TempSrc:      SpO2: (!) 83%   96%  Weight:   50.4 kg (111 lb 1.8 oz)   Height:        Intake/Output Summary (Last 24 hours) at 09/25/16 0812 Last data filed at  09/25/16 9702  Gross per 24 hour  Intake          2525.67 ml  Output             2400 ml  Net           125.67 ml   Filed Weights   09/24/16 0500 09/24/16 2024 09/25/16 0500  Weight: 48.1 kg (106 lb 0.7 oz) 50.4 kg (111 lb 1.6 oz) 50.4 kg (111 lb 1.8 oz)    Examination:  General exam: deconditioned E ENT. Mild pallor, no icterus, oral mucosa moist.   Respiratory system: Scattered bilateral rales at bases, no wheezing. No accessory muscle use.  Cardiovascular system: S1 & S2 heard, RRR. No JVD, murmurs, rubs, gallops or clicks. No pedal edema. Gastrointestinal system: Abdomen is nondistended, soft and nontender. No organomegaly or masses felt. Normal bowel sounds heard. Central nervous system: Alert and oriented. No focal neurological deficits. Extremities: Symmetric 5 x 5 power. Skin: No rashes, lesions or ulcers     Data Reviewed: I have personally reviewed following labs and imaging studies  CBC:  Recent Labs Lab 09/23/16 1553 09/24/16 0322 09/24/16 1256 09/25/16 0223  WBC 21.5* 14.7* 15.9* 16.6*  NEUTROABS 14.7*  --  12.6* 14.1*  HGB 10.3* 10.7* 11.5* 10.7*  HCT 31.4* 32.0* 34.7* 31.6*  MCV 86.0 87.0 85.3 83.8  PLT 161 143* 173 637   Basic Metabolic Panel:  Recent Labs Lab 09/23/16 1553 09/24/16 0322 09/25/16 0223  NA 128* 130* 138  K 4.2 4.9 3.8  CL 97* 104 110  CO2 20* 16* 18*  GLUCOSE 101* 132* 178*  BUN 47* 44* 25*  CREATININE 2.32* 2.03* 1.55*  CALCIUM 9.4 8.8* 9.1   GFR: Estimated Creatinine Clearance: 21.5 mL/min (A) (by C-G formula based on SCr of 1.55 mg/dL (H)). Liver Function Tests:  Recent Labs Lab 09/23/16 1553 09/24/16 0322  AST 181* 202*  ALT 78* 104*  ALKPHOS 125 169*  BILITOT 0.9 1.0  PROT 6.9 6.0*  ALBUMIN 3.0* 2.7*   No results for input(s): LIPASE, AMYLASE in the last 168 hours. No results for input(s): AMMONIA in the last 168 hours. Coagulation Profile:  Recent Labs Lab 09/23/16 1553  INR 1.07   Cardiac  Enzymes: No results for input(s): CKTOTAL, CKMB, CKMBINDEX, TROPONINI in the last 168 hours. BNP (last 3 results) No results for input(s): PROBNP in the last 8760 hours. HbA1C:  Recent Labs  09/23/16 1553  HGBA1C 7.4*   CBG:  Recent Labs Lab 09/24/16 1243 09/24/16 1630 09/24/16 2158 09/25/16 0031 09/25/16 0755  GLUCAP 181* 168* 146* 162* 125*   Lipid Profile: No results for input(s): CHOL, HDL, LDLCALC, TRIG, CHOLHDL, LDLDIRECT in the last 72 hours. Thyroid Function Tests:  Recent Labs  09/23/16 2039  TSH 4.778*   Anemia Panel: No results for input(s): VITAMINB12, FOLATE, FERRITIN, TIBC, IRON, RETICCTPCT in the last 72 hours. Sepsis Labs:  Recent Labs Lab 09/23/16 1638  LATICACIDVEN 1.68    No results found for this or any previous visit (from the past 240 hour(s)).       Radiology Studies: Dg Chest 2 View  Result Date: 09/23/2016 CLINICAL DATA:  83 year old female with history weakness and  fatigue for the past 4 days. EXAM: CHEST  2 VIEW COMPARISON:  Chest x-ray 04/13/2006. FINDINGS: Lung volumes are normal. No consolidative airspace disease. Trace left pleural effusion. No right pleural effusion. No pneumothorax. No pulmonary nodule or mass noted. Pulmonary vasculature and the cardiomediastinal silhouette are within normal limits. Atherosclerosis in the thoracic aorta. Status post left mastectomy and left axillary lymph node dissection. Orthopedic fixation hardware in the lower cervical spine. IMPRESSION: 1. Trace left pleural effusion. 2. Aortic atherosclerosis. Electronically Signed   By: Vinnie Langton M.D.   On: 09/23/2016 16:41   US Abdomen Complete  Result Date: 09/23/2016 CLINICAL DATA:  Acute renal failure.  Elevated LFTs. EXAM: ABDOMEN ULTRASOUND COMPLETE COMPARISON:  None. FINDINGS: Gallbladder: Cholelithiasis is seen in the gallbladder with the largest stone measuring 7 mm. No wall thickening, pericholecystic fluid, or Murphy's sign. Common bile  duct: Diameter: 4 mm Liver: No focal lesion identified. Within normal limits in parenchymal echogenicity. IVC: No abnormality visualized. Pancreas: Visualized portion unremarkable. Spleen: Size and appearance within normal limits. Right Kidney: Length: 10.5 cm.  Increased cortical echogenicity. Left Kidney: Length: 9.9 cm.  Increased cortical echogenicity. Abdominal aorta: No aneurysm visualized. Other findings: None. IMPRESSION: 1. Cholelithiasis with no wall thickening, pericholecystic fluid, or Murphy's sign. 2. Increased cortical echogenicity associated with the kidneys suggests medical renal disease. Electronically Signed   By: Dorise Bullion III M.D   On: 09/23/2016 22:14   Dg Chest Port 1 View  Result Date: 09/25/2016 CLINICAL DATA:  Shortness of breath.  History of breast cancer. EXAM: PORTABLE CHEST 1 VIEW COMPARISON:  Chest radiograph September 23, 2016 FINDINGS: Cardiac silhouette is normal. Patient rotated LEFT. Calcified aortic knob. Diffuse interstitial prominence with patchy perihilar airspace opacities. Small suspected RIGHT pleural effusion, LEFT costophrenic angle not completely imaged. RIGHT Kerley B-lines. Apical pleural thickening. No pneumothorax. ACDF. Surgical clips project in LEFT axilla, status post LEFT mastectomy. IMPRESSION: Increasing perihilar airspace opacities with interstitial prominence and small RIGHT pleural effusion seen with pulmonary edema, less likely pneumonia. Electronically Signed   By: Elon Alas M.D.   On: 09/25/2016 01:33        Scheduled Meds: . aspirin EC  81 mg Oral Daily  . buPROPion  150 mg Oral Daily  . cholecalciferol  400 Units Oral BID  . cycloSPORINE  1 drop Both Eyes TID  . DULoxetine  60 mg Oral Daily  . famotidine  20 mg Oral Daily  . feeding supplement (ENSURE ENLIVE)  237 mL Oral BID BM  . furosemide  20 mg Intravenous Once  . heparin  5,000 Units Subcutaneous Q8H  . insulin aspart  0-9 Units Subcutaneous TID WC  . mirabegron ER   25 mg Oral Daily  . pregabalin  75 mg Oral BID  . rosuvastatin  10 mg Oral q1800  . triamcinolone cream  1 application Topical BID  . vitamin E  800 Units Oral Daily   Continuous Infusions: . cefTRIAXone (ROCEPHIN)  IV Stopped (09/24/16 1154)     LOS: 2 days      Abrar Bilton Gerome Apley, MD [facility] Pager [phone]  If 7PM-7AM, please contact night-coverage [email or web address] Password [email or web address] 09/25/2016, 8:12 AM

## 2016-09-26 DIAGNOSIS — E118 Type 2 diabetes mellitus with unspecified complications: Secondary | ICD-10-CM

## 2016-09-26 DIAGNOSIS — R748 Abnormal levels of other serum enzymes: Secondary | ICD-10-CM

## 2016-09-26 DIAGNOSIS — R338 Other retention of urine: Secondary | ICD-10-CM

## 2016-09-26 DIAGNOSIS — E43 Unspecified severe protein-calorie malnutrition: Secondary | ICD-10-CM

## 2016-09-26 DIAGNOSIS — N182 Chronic kidney disease, stage 2 (mild): Secondary | ICD-10-CM

## 2016-09-26 DIAGNOSIS — I10 Essential (primary) hypertension: Secondary | ICD-10-CM

## 2016-09-26 DIAGNOSIS — J9601 Acute respiratory failure with hypoxia: Secondary | ICD-10-CM

## 2016-09-26 DIAGNOSIS — E1165 Type 2 diabetes mellitus with hyperglycemia: Secondary | ICD-10-CM

## 2016-09-26 LAB — CBC WITH DIFFERENTIAL/PLATELET
Basophils Absolute: 0 10*3/uL (ref 0.0–0.1)
Basophils Relative: 0 %
Eosinophils Absolute: 0.1 10*3/uL (ref 0.0–0.7)
Eosinophils Relative: 1 %
HCT: 31.9 % — ABNORMAL LOW (ref 36.0–46.0)
Hemoglobin: 11 g/dL — ABNORMAL LOW (ref 12.0–15.0)
Lymphocytes Relative: 31 %
Lymphs Abs: 4.3 10*3/uL — ABNORMAL HIGH (ref 0.7–4.0)
MCH: 28.6 pg (ref 26.0–34.0)
MCHC: 34.5 g/dL (ref 30.0–36.0)
MCV: 83.1 fL (ref 78.0–100.0)
Monocytes Absolute: 1.4 10*3/uL — ABNORMAL HIGH (ref 0.1–1.0)
Monocytes Relative: 10 %
Neutro Abs: 8.1 10*3/uL — ABNORMAL HIGH (ref 1.7–7.7)
Neutrophils Relative %: 58 %
Platelets: 153 10*3/uL (ref 150–400)
RBC: 3.84 MIL/uL — ABNORMAL LOW (ref 3.87–5.11)
RDW: 13.5 % (ref 11.5–15.5)
WBC: 13.9 10*3/uL — ABNORMAL HIGH (ref 4.0–10.5)

## 2016-09-26 LAB — GLUCOSE, CAPILLARY
Glucose-Capillary: 125 mg/dL — ABNORMAL HIGH (ref 65–99)
Glucose-Capillary: 180 mg/dL — ABNORMAL HIGH (ref 65–99)
Glucose-Capillary: 183 mg/dL — ABNORMAL HIGH (ref 65–99)
Glucose-Capillary: 162 mg/dL — ABNORMAL HIGH (ref 65–99)

## 2016-09-26 LAB — BASIC METABOLIC PANEL
Anion gap: 11 (ref 5–15)
BUN: 24 mg/dL — ABNORMAL HIGH (ref 6–20)
CO2: 22 mmol/L (ref 22–32)
Calcium: 9.5 mg/dL (ref 8.9–10.3)
Chloride: 104 mmol/L (ref 101–111)
Creatinine, Ser: 1.51 mg/dL — ABNORMAL HIGH (ref 0.44–1.00)
GFR calc Af Amer: 35 mL/min — ABNORMAL LOW (ref >60)
GFR calc non Af Amer: 31 mL/min — ABNORMAL LOW (ref >60)
Glucose, Bld: 130 mg/dL — ABNORMAL HIGH (ref 65–99)
Potassium: 3.4 mmol/L — ABNORMAL LOW (ref 3.5–5.1)
Sodium: 137 mmol/L (ref 135–145)

## 2016-09-26 LAB — HEPATIC FUNCTION PANEL
ALT: 70 U/L — ABNORMAL HIGH (ref 14–54)
AST: 54 U/L — ABNORMAL HIGH (ref 15–41)
Albumin: 2.5 g/dL — ABNORMAL LOW (ref 3.5–5.0)
Alkaline Phosphatase: 153 U/L — ABNORMAL HIGH (ref 38–126)
Bilirubin, Direct: 0.2 mg/dL (ref 0.1–0.5)
Indirect Bilirubin: 0.4 mg/dL (ref 0.3–0.9)
Total Bilirubin: 0.6 mg/dL (ref 0.3–1.2)
Total Protein: 6.8 g/dL (ref 6.5–8.1)

## 2016-09-26 MED ORDER — ENSURE ENLIVE PO LIQD
237.0000 mL | Freq: Three times a day (TID) | ORAL | Status: DC
Start: 2016-09-26 — End: 2016-09-28
  Administered 2016-09-26 – 2016-09-27 (×3): 237 mL via ORAL

## 2016-09-26 NOTE — Progress Notes (Signed)
PROGRESS NOTE    Deanna [name]  ZTI:[id number] DOB: Mar 07, 1930 DOA: 09/23/2016 PCP: Marton Redwood, MD     Brief Narrative:  83 year old WF PMHx Anxiety, Depression, breast cancer, DM type II without complication, HTN,  Presented with malaise, loss of appetite, and orthostatic symptoms. Patient is known to have depression, hypertension,and type 2 diabetes mellitus. Her symptoms have been ongoing for last week, significant decreased by mouth intake, she was evaluated by her primary care provider and found to have a systolic blood pressure of 90 when sitting, down to 70 while standing, she was referred to the hospital for further evaluation. On initial physical examination blood pressure 112/50, heart rate 68, respiration rate 16 to 22, temperature 98.4, oxygen saturation 99%. Dry mucous membranes, lungs were clear to auscultation bilaterally, heart S1-S2 present and rhythmic, abdomen was soft nontender, lower extremities no edema, skin with poor turgor. Sodium 128, potassium 4.2, chloride 97, bicarbonate 20, glucose 101, BUN 47, creatinine 2.32, AST 181, ALT 78, white count 21.5, hemoglobin 10.3, hematocrit 31.4, platelets 161. TSH 4.77, urinalysis with too numerous to count white cells, 100 protein, large leukocytes. Chest x-ray was clear for infiltrates. Abdominal ultrasound with cholelithiasis with no wall thickening. Increased renal cortical echogenicity consistent with medical renal disease.   Patient was admitted to the hospital working diagnosis of acute kidney injury, likely prerenal, (WBC 18,000 per daughter at [place] office) complicated by sepsis due tourinary tract infection, hyponatremia, and elevated transaminases.     Subjective: 8/19  A/O 4, negative CP, negative SOB, negative N/V, negative abdominal pain   Assessment & Plan:   Principal Problem:   AKI (acute kidney injury) ([place]) Active Problems:   Loss of appetite   Orthostasis   Hyponatremia   Leukocytosis  Normocytic anemia   Depression with anxiety   Diabetes mellitus type II, non insulin dependent ([place])   Transaminasemia   Sepsis UTI -Per daughter at 38 office initial WBCs 18,000, prior to admission  -8/19 overnight patient meets criteria for SIRS MAXIMUM TEMPERATURE> 38C, RR> 20, HR> 90, WBC> 12 -Continue current antibiotic until culture/sensitivity return. Would complete 5 day course of antibiotic -Blood culture pending -Urine culture pending -8/18 PCXR pulmonary edema vs pneumonia -8/19 PT/OT: Evaluate patient for CIR vs SNF  Acute urinary retention -Secondary to UTI -Will require voiding trial prior to discharge  Acute on CKD stage? (Cr 08/2006 = 1.3 ) -Prerenal? -Monitor closely   Recent Labs Lab 09/23/16 1553 09/24/16 0322 09/25/16 0223 09/26/16 0257  CREATININE 2.32* 2.03* 1.55* 1.51*  -Improving most likely close to baseline  Acute Respiratory Failure with hypoxia -Most likely multifactorial fluid overload, acute renal failure, CHF? -Echocardiogram pending -Strict in and out -Daily weight -Titrate O2 to maintain SPO2> 92%    Elevated liver enzymes -Shock liver? Trend enzymes -Obtain acute hepatitis panel  Essential HTN -Stable  Diabetes type 2 uncontrolled with complication -8/25 Hemoglobin A1c= 7.4 -Sensitive SSI  Depression/anxiety. Continue bupropion, duloxetine, as needed alprazolam.   Moderate Protein calorie malnutrition -Encourage patient to eat, Ensure TID     DVT prophylaxis: Subcutaneous heparin Code Status: Full Family Communication: Daughter present at bedside Disposition Plan: TBD   Consultants:  None  Procedures/Significant Events:  8/16 Abdominal ultrasound:-Cholelithiasis with no wall thickening, pericholecystic fluid, or Murphy's sign. -Increased cortical echogenicity C/W medical renal disease.       I have personally reviewed and interpreted all radiology studies and my findings are as above.  VENTILATOR  SETTINGS:    Cultures 8/17 blood NGTD 8/19  blood pending 8/19 urine pending    Antimicrobials: Anti-infectives    Start     Stop   09/24/16 1200  cefTRIAXone (ROCEPHIN) 1 g in dextrose 5 % 50 mL IVPB             Devices   LINES / TUBES:      Continuous Infusions: . cefTRIAXone (ROCEPHIN)  IV Stopped (09/25/16 1140)     Objective: Vitals:   09/25/16 0622 09/25/16 0925 09/25/16 1714 09/25/16 2231  BP: (!) 130/58 (!) 149/58 112/68 140/61  Pulse: 91 91 85 85  Resp: 16 18 18 19   Temp: 99.3 F (37.4 C) 98.8 F (37.1 C) 98.6 F (37 C) 97.8 F (36.6 C)  TempSrc:  Oral Oral Oral  SpO2: 96% 94% 100% 95%  Weight:    110 lb 7.2 oz (50.1 kg)  Height:        Intake/Output Summary (Last 24 hours) at 09/26/16 0816 Last data filed at 09/26/16 0645  Gross per 24 hour  Intake              457 ml  Output                0 ml  Net              457 ml   Filed Weights   09/24/16 2024 09/25/16 0500 09/25/16 2231  Weight: 111 lb 1.6 oz (50.4 kg) 111 lb 1.8 oz (50.4 kg) 110 lb 7.2 oz (50.1 kg)    Examination:  General: A/O 4, No acute respiratory distress, cachectic Eyes: negative scleral hemorrhage, negative anisocoria, negative icterus Lungs: Clear to auscultation bilaterally without wheezes or crackles Cardiovascular: Regular rate and rhythm without murmur gallop or rub normal S1 and S2 Abdomen: negative abdominal pain, nondistended, positive soft, bowel sounds, no rebound, no ascites, no appreciable mass Extremities: No significant cyanosis, clubbing, or edema bilateral lower extremities Skin: Negative rashes, lesions, ulcers Psychiatric:  Negative depression, negative anxiety, negative fatigue, negative mania  Central nervous system:  Cranial nerves II through XII intact, tongue/uvula midline, all extremities muscle strength 5/5, sensation intact throughout, negative dysarthria, negative expressive aphasia, negative receptive aphasia.  .     Data Reviewed:  Care during the described time interval was provided by me .  I have reviewed this patient's available data, including medical history, events of note, physical examination, and all test results as part of my evaluation.  CBC:  Recent Labs Lab 09/23/16 1553 09/24/16 0322 09/24/16 1256 09/25/16 0223 09/26/16 0257  WBC 21.5* 14.7* 15.9* 16.6* 13.9*  NEUTROABS 14.7*  --  12.6* 14.1* 8.1*  HGB 10.3* 10.7* 11.5* 10.7* 11.0*  HCT 31.4* 32.0* 34.7* 31.6* 31.9*  MCV 86.0 87.0 85.3 83.8 83.1  PLT 161 143* 173 165 956   Basic Metabolic Panel:  Recent Labs Lab 09/23/16 1553 09/24/16 0322 09/25/16 0223 09/26/16 0257  NA 128* 130* 138 137  K 4.2 4.9 3.8 3.4*  CL 97* 104 110 104  CO2 20* 16* 18* 22  GLUCOSE 101* 132* 178* 130*  BUN 47* 44* 25* 24*  CREATININE 2.32* 2.03* 1.55* 1.51*  CALCIUM 9.4 8.8* 9.1 9.5   GFR: Estimated Creatinine Clearance: 21.9 mL/min (A) (by C-G formula based on SCr of 1.51 mg/dL (H)). Liver Function Tests:  Recent Labs Lab 09/23/16 1553 09/24/16 0322 09/26/16 0257  AST 181* 202* 54*  ALT 78* 104* 70*  ALKPHOS 125 169* 153*  BILITOT 0.9 1.0 0.6  PROT 6.9 6.0* 6.8  ALBUMIN 3.0* 2.7* 2.5*   No results for input(s): LIPASE, AMYLASE in the last 168 hours. No results for input(s): AMMONIA in the last 168 hours. Coagulation Profile:  Recent Labs Lab 09/23/16 1553  INR 1.07   Cardiac Enzymes: No results for input(s): CKTOTAL, CKMB, CKMBINDEX, TROPONINI in the last 168 hours. BNP (last 3 results) No results for input(s): PROBNP in the last 8760 hours. HbA1C:  Recent Labs  09/23/16 1553  HGBA1C 7.4*   CBG:  Recent Labs Lab 09/25/16 0755 09/25/16 1224 09/25/16 1641 09/25/16 2213 09/26/16 0759  GLUCAP 125* 201* 186* 173* 125*   Lipid Profile: No results for input(s): CHOL, HDL, LDLCALC, TRIG, CHOLHDL, LDLDIRECT in the last 72 hours. Thyroid Function Tests:  Recent Labs  09/23/16 2039  TSH 4.778*   Anemia Panel: No results for  input(s): VITAMINB12, FOLATE, FERRITIN, TIBC, IRON, RETICCTPCT in the last 72 hours. Sepsis Labs:  Recent Labs Lab 09/23/16 1638  LATICACIDVEN 1.68    Recent Results (from the past 240 hour(s))  Culture, blood (routine x 2)     Status: None (Preliminary result)   Collection Time: 09/24/16  7:53 PM  Result Value Ref Range Status   Specimen Description BLOOD RIGHT ARM  Final   Special Requests   Final    BOTTLES DRAWN AEROBIC AND ANAEROBIC Blood Culture adequate volume   Culture NO GROWTH < 24 HOURS  Final   Report Status PENDING  Incomplete         Radiology Studies: Dg Chest Port 1 View  Result Date: 09/25/2016 CLINICAL DATA:  Shortness of breath.  History of breast cancer. EXAM: PORTABLE CHEST 1 VIEW COMPARISON:  Chest radiograph September 23, 2016 FINDINGS: Cardiac silhouette is normal. Patient rotated LEFT. Calcified aortic knob. Diffuse interstitial prominence with patchy perihilar airspace opacities. Small suspected RIGHT pleural effusion, LEFT costophrenic angle not completely imaged. RIGHT Kerley B-lines. Apical pleural thickening. No pneumothorax. ACDF. Surgical clips project in LEFT axilla, status post LEFT mastectomy. IMPRESSION: Increasing perihilar airspace opacities with interstitial prominence and small RIGHT pleural effusion seen with pulmonary edema, less likely pneumonia. Electronically Signed   By: Elon Alas M.D.   On: 09/25/2016 01:33        Scheduled Meds: . aspirin EC  81 mg Oral Daily  . buPROPion  150 mg Oral Daily  . cholecalciferol  400 Units Oral BID  . cycloSPORINE  1 drop Both Eyes TID  . DULoxetine  60 mg Oral Daily  . famotidine  20 mg Oral Daily  . feeding supplement (ENSURE ENLIVE)  237 mL Oral BID BM  . heparin  5,000 Units Subcutaneous Q8H  . insulin aspart  0-9 Units Subcutaneous TID WC  . mirabegron ER  25 mg Oral Daily  . pregabalin  75 mg Oral BID  . rosuvastatin  10 mg Oral q1800  . triamcinolone cream  1 application Topical  BID  . vitamin E  800 Units Oral Daily   Continuous Infusions: . cefTRIAXone (ROCEPHIN)  IV Stopped (09/25/16 1140)     LOS: 3 days    Time spent:40 min    Phila Shoaf, Geraldo Docker, MD [facility] Pager [phone]  If 7PM-7AM, please contact night-coverage [email or web address] Password [email or web address] 09/26/2016, 8:16 AM

## 2016-09-27 ENCOUNTER — Inpatient Hospital Stay (HOSPITAL_COMMUNITY): Payer: Medicare Other

## 2016-09-27 DIAGNOSIS — N3000 Acute cystitis without hematuria: Secondary | ICD-10-CM

## 2016-09-27 DIAGNOSIS — J810 Acute pulmonary edema: Secondary | ICD-10-CM

## 2016-09-27 DIAGNOSIS — I340 Nonrheumatic mitral (valve) insufficiency: Secondary | ICD-10-CM

## 2016-09-27 LAB — COMPREHENSIVE METABOLIC PANEL
ALT: 68 U/L — ABNORMAL HIGH (ref 14–54)
AST: 59 U/L — ABNORMAL HIGH (ref 15–41)
Albumin: 2.5 g/dL — ABNORMAL LOW (ref 3.5–5.0)
Alkaline Phosphatase: 140 U/L — ABNORMAL HIGH (ref 38–126)
Anion gap: 10 (ref 5–15)
BUN: 30 mg/dL — ABNORMAL HIGH (ref 6–20)
CO2: 23 mmol/L (ref 22–32)
Calcium: 9.8 mg/dL (ref 8.9–10.3)
Chloride: 104 mmol/L (ref 101–111)
Creatinine, Ser: 1.49 mg/dL — ABNORMAL HIGH (ref 0.44–1.00)
GFR calc Af Amer: 36 mL/min — ABNORMAL LOW (ref >60)
GFR calc non Af Amer: 31 mL/min — ABNORMAL LOW (ref >60)
Glucose, Bld: 221 mg/dL — ABNORMAL HIGH (ref 65–99)
Potassium: 3.5 mmol/L (ref 3.5–5.1)
Sodium: 137 mmol/L (ref 135–145)
Total Bilirubin: 0.6 mg/dL (ref 0.3–1.2)
Total Protein: 6.6 g/dL (ref 6.5–8.1)

## 2016-09-27 LAB — URINE CULTURE: Culture: NO GROWTH

## 2016-09-27 LAB — ECHOCARDIOGRAM COMPLETE
Ao-asc: 27 cm
E decel time: 327 msec
E/e' ratio: 13.48
FS: 28 % (ref 28–44)
Height: 64 in
IVS/LV PW RATIO, ED: 1.06
LA ID, A-P, ES: 32 mm
LA diam end sys: 32 mm
LA diam index: 2.09 cm/m2
LA vol A4C: 42.6 ml
LA vol index: 27 mL/m2
LA vol: 41.3 mL
LV E/e' medial: 13.48
LV E/e'average: 13.48
LV PW d: 12.7 mm — AB (ref 0.6–1.1)
LV e' LATERAL: 5.11 cm/s
LV sys vol index: 8 mL/m2
LV sys vol: 13 mL — AB (ref 14–42)
LVOT SV: 48 mL
LVOT VTI: 23.8 cm
LVOT area: 2.01 cm2
LVOT diameter: 16 mm
LVOT peak grad rest: 5 mmHg
LVOT peak vel: 117 cm/s
Lateral S' vel: 12.1 cm/s
MV Dec: 327
MV pk A vel: 88.8 m/s
MV pk E vel: 68.9 m/s
RV sys press: 50 mmHg
Reg peak vel: 297 cm/s
TAPSE: 19.7 mm
TDI e' lateral: 5.11
TDI e' medial: 4.35
TR max vel: 297 cm/s
Weight: 1792.2 oz

## 2016-09-27 LAB — CBC
HCT: 32.3 % — ABNORMAL LOW (ref 36.0–46.0)
Hemoglobin: 10.8 g/dL — ABNORMAL LOW (ref 12.0–15.0)
MCH: 27.8 pg (ref 26.0–34.0)
MCHC: 33.4 g/dL (ref 30.0–36.0)
MCV: 83 fL (ref 78.0–100.0)
Platelets: 208 10*3/uL (ref 150–400)
RBC: 3.89 MIL/uL (ref 3.87–5.11)
RDW: 13.6 % (ref 11.5–15.5)
WBC: 12.6 10*3/uL — ABNORMAL HIGH (ref 4.0–10.5)

## 2016-09-27 LAB — GLUCOSE, CAPILLARY
Glucose-Capillary: 150 mg/dL — ABNORMAL HIGH (ref 65–99)
Glucose-Capillary: 276 mg/dL — ABNORMAL HIGH (ref 65–99)
Glucose-Capillary: 155 mg/dL — ABNORMAL HIGH (ref 65–99)

## 2016-09-27 LAB — MAGNESIUM: Magnesium: 1.7 mg/dL (ref 1.7–2.4)

## 2016-09-27 NOTE — Progress Notes (Signed)
PROGRESS NOTE    Deanna [name]  RNH:[id number] DOB: Jan 27, 1931 DOA: 09/23/2016 PCP: Marton Redwood, MD    Brief Narrative:  83 year old female who presented with malaise, loss of appetite, and orthostatic symptoms. Patient is known to have depression, hypertension,and type 2 diabetes mellitus. Her symptoms have been ongoing for last week, significant decreased by mouth intake, she was evaluated by her primary care provider and found to have a systolic blood pressure of 90 when sitting, down to 70 while standing, she was referred to the hospital for further evaluation. On initial physical examination blood pressure 112/50, heart rate 68, respiration rate 16 to 22, temperature 98.4, oxygen saturation 99%. Dry mucous membranes, lungs were clear to auscultation bilaterally, heart S1-S2 present and rhythmic, abdomen was soft nontender, lower extremities no edema, skin with poor turgor. Sodium 128, potassium 4.2, chloride 97, bicarbonate 20, glucose 101, BUN 47, creatinine 2.32, AST 181, ALT 78, white count 21.5, hemoglobin 10.3, hematocrit 31.4, platelets 161. TSH 4.77, urinalysis with too numerous to count white cells, 100 protein, large leukocytes. Chest x-ray was clear for infiltrates. Abdominal ultrasound with cholelithiasis with no wall thickening. Increased renal cortical echogenicity consistent with medical renal disease.   Patient was admitted to the hospital working diagnosis of acute kidney injury, likely prerenal, complicated by sepsis due tourinary tract infection, hyponatremia, and elevated transaminases.    Assessment & Plan:   Principal Problem:   AKI (acute kidney injury) ([place]) Active Problems:   Loss of appetite   Orthostasis   Hyponatremia   Leukocytosis   Normocytic anemia   Depression with anxiety   Diabetes mellitus type II, non insulin dependent ([place])   Transaminasemia  1. New pulmonary edema with acute hypoxic respiratory failure, due to volume overload. Urine  output 1,225 over last 24 hours, clinically improved, dyspnea has improved. Will follow on chest film today, will continue oxymetry monitoring.   1. Prerenal renal failure with acute kidney injury. Serum cr down to 1.49 from 1,55, Urine output 1225 ml over last 24 hours, will continue to hold on IV fluids, follow renal panel in am, avoid hypotension or nephrotoxic medications. Hold on further diuresis.     2. Urinary tract infection. Cultures, continue to be no growth, antibiotic therapy with IV ceftriaxone #3, patient has remained afebrile, white cell count at 12,6.   3. Elevated transaminases. Resolved.    4. Hypertension. Blood pressure systolic 383'A systolic, keep negative fluid balance.   5. Type 2 diabetes mellitus. Capillary glucose 183, 162, 150, 276, continue glucose cover and monitoring with insulin sliding scale.   6. Depression/anxiety. Continue bupropion, duloxetine, as needed alprazolam. No agitation.   DVT prophylaxis:heparin Code Status:Full  Family Communication: Disposition Plan:   Consultants:    Procedures:    Antimicrobials:   Ceftriaxone    Subjective: Patient with improved dyspnea, no associated cough or wheezing, no worsening factors, improved with supplemental 02 per [place]. No nausea or vomiting.   Objective: Vitals:   09/26/16 0922 09/26/16 1638 09/26/16 2056 09/27/16 0556  BP: 122/80 (!) 129/54 (!) 131/54 132/63  Pulse: 83 77 79 68  Resp: 18 18 17    Temp: 98 F (36.7 C) 98.2 F (36.8 C) 98.6 F (37 C) 98 F (36.7 C)  TempSrc: Oral Oral Oral Oral  SpO2: 95% 95% 99% 98%  Weight:   54.1 kg (119 lb 5.2 oz)   Height:        Intake/Output Summary (Last 24 hours) at 09/27/16 0751 Last data filed at 09/27/16  0619  Gross per 24 hour  Intake               50 ml  Output             1050 ml  Net            -1000 ml   Filed Weights   09/25/16 2231 09/26/16 0821 09/26/16 2056  Weight: 50.1 kg (110 lb 7.2 oz) 54.1 kg (119  lb 3.2 oz) 54.1 kg (119 lb 5.2 oz)    Examination:  General: deconditioned and ill looking appearing E ENT: mild pallor, no icterus Cardiovascular. No JVD, heart S1 and S2 present and rhythmic, no gallops, rubs or murmurs. No lower extremity edema Pulmonary: decreased breath sounds at bases, no wheezing, rales or rhonchi Gastrointestinal: abdomen flat, non distended or tender Skin: no rashes Musculoskeletal: no joint deformity   Data Reviewed: I have personally reviewed following labs and imaging studies  CBC:  Recent Labs Lab 09/23/16 1553 09/24/16 0322 09/24/16 1256 09/25/16 0223 09/26/16 0257 09/27/16 0229  WBC 21.5* 14.7* 15.9* 16.6* 13.9* 12.6*  NEUTROABS 14.7*  --  12.6* 14.1* 8.1*  --   HGB 10.3* 10.7* 11.5* 10.7* 11.0* 10.8*  HCT 31.4* 32.0* 34.7* 31.6* 31.9* 32.3*  MCV 86.0 87.0 85.3 83.8 83.1 83.0  PLT 161 143* 173 165 153 196   Basic Metabolic Panel:  Recent Labs Lab 09/23/16 1553 09/24/16 0322 09/25/16 0223 09/26/16 0257 09/27/16 0229  NA 128* 130* 138 137 137  K 4.2 4.9 3.8 3.4* 3.5  CL 97* 104 110 104 104  CO2 20* 16* 18* 22 23  GLUCOSE 101* 132* 178* 130* 221*  BUN 47* 44* 25* 24* 30*  CREATININE 2.32* 2.03* 1.55* 1.51* 1.49*  CALCIUM 9.4 8.8* 9.1 9.5 9.8  MG  --   --   --   --  1.7   GFR: Estimated Creatinine Clearance: 24 mL/min (A) (by C-G formula based on SCr of 1.49 mg/dL (H)). Liver Function Tests:  Recent Labs Lab 09/23/16 1553 09/24/16 0322 09/26/16 0257 09/27/16 0229  AST 181* 202* 54* 59*  ALT 78* 104* 70* 68*  ALKPHOS 125 169* 153* 140*  BILITOT 0.9 1.0 0.6 0.6  PROT 6.9 6.0* 6.8 6.6  ALBUMIN 3.0* 2.7* 2.5* 2.5*   No results for input(s): LIPASE, AMYLASE in the last 168 hours. No results for input(s): AMMONIA in the last 168 hours. Coagulation Profile:  Recent Labs Lab 09/23/16 1553  INR 1.07   Cardiac Enzymes: No results for input(s): CKTOTAL, CKMB, CKMBINDEX, TROPONINI in the last 168 hours. BNP (last 3  results) No results for input(s): PROBNP in the last 8760 hours. HbA1C: No results for input(s): HGBA1C in the last 72 hours. CBG:  Recent Labs Lab 09/25/16 2213 09/26/16 0759 09/26/16 1155 09/26/16 1638 09/26/16 2112  GLUCAP 173* 125* 180* 183* 162*   Lipid Profile: No results for input(s): CHOL, HDL, LDLCALC, TRIG, CHOLHDL, LDLDIRECT in the last 72 hours. Thyroid Function Tests: No results for input(s): TSH, T4TOTAL, FREET4, T3FREE, THYROIDAB in the last 72 hours. Anemia Panel: No results for input(s): VITAMINB12, FOLATE, FERRITIN, TIBC, IRON, RETICCTPCT in the last 72 hours.    Radiology Studies: I have reviewed all of the imaging during this hospital visit personally     Scheduled Meds: . aspirin EC  81 mg Oral Daily  . buPROPion  150 mg Oral Daily  . cholecalciferol  400 Units Oral BID  . cycloSPORINE  1 drop Both Eyes TID  .  DULoxetine  60 mg Oral Daily  . famotidine  20 mg Oral Daily  . feeding supplement (ENSURE ENLIVE)  237 mL Oral TID BM  . heparin  5,000 Units Subcutaneous Q8H  . insulin aspart  0-9 Units Subcutaneous TID WC  . mirabegron ER  25 mg Oral Daily  . pregabalin  75 mg Oral BID  . rosuvastatin  10 mg Oral q1800  . triamcinolone cream  1 application Topical BID  . vitamin E  800 Units Oral Daily   Continuous Infusions: . cefTRIAXone (ROCEPHIN)  IV Stopped (09/26/16 1347)     LOS: 4 days        Deanna [name] Gerome Apley, MD [facility] Pager [phone]

## 2016-09-27 NOTE — Progress Notes (Signed)
IV team in to look at pt IV, no longer red at this time although still swollen. Patient refusing a restart at this time as she states "I don't need this IV med until tomorrow, maybe they will switch it to a pill." Patient educated. Will attempt to restart IV again.  Continue to monitor.

## 2016-09-27 NOTE — Progress Notes (Signed)
Pt's daughter stated that yesterday Dr. Sherral Hammers told her we would do another urine culture today. No orders seen regarding this. Paged Dr. Cathlean Sauer who is covering today regarding this, awaiting call back.

## 2016-09-27 NOTE — Evaluation (Signed)
Occupational Therapy Evaluation Patient Details Name: Deanna [name] MRN: [id number] DOB: March 23, 1930 Today's Date: 09/27/2016    History of Present Illness Pt is a 83 yo female admitted through ED on 09/23/16 with a fall and increased confusion. Pt was diagnosed with an AKI, orthostasis, hyponatremia, and hpertransminases. PMH significant for Depression, anxiety, HTN, DM2.    Clinical Impression   PTA, pt living in independent living and performing ADLs and IADLs. Pt currently performing ADLs at [facility] level with Min VCs and increased time. Pt denies any light headedness or dizziness. BP supine 136/72; sitting 104/59; and standing 114/59. Pt would benefit from acute OT to facilitate safe dc. Recommend dc home with HHOT to increase safety and independence with ADLs and functional mobility.     Follow Up Recommendations  Home health OT;Supervision - Intermittent    Equipment Recommendations  None recommended by OT    Recommendations for Other Services PT consult     Precautions / Restrictions Precautions Precautions: Fall Restrictions Weight Bearing Restrictions: No      Mobility Bed Mobility Overal bed mobility: Needs Assistance Bed Mobility: Supine to Sit;Sit to Supine     Supine to sit: Min guard Sit to supine: Min guard   General bed mobility comments: Cues to initiate and fully complete task of sitting up; slow moving and inefficient, but able to get up without physical assist  Transfers Overall transfer level: Needs assistance Equipment used: 1 person hand held assist Transfers: Sit to/from Stand Sit to Stand: Min assist         General transfer comment: Much improved power up, min assist to steady; noted posterior bias and pt braced backs of LEs against bed for stability (indicative of higher fall risk)    Balance     Sitting balance-Leahy Scale: Good       Standing balance-Leahy Scale: Fair                             ADL either  performed or assessed with clinical judgement   ADL Overall ADL's : Needs assistance/impaired Eating/Feeding: Set up;Sitting   Grooming: Min guard;Standing;Cueing for sequencing;Oral care;Brushing hair Grooming Details (indicate cue type and reason): Completed oral care at sink with Min Guard A while talking with PT. Pt requiring increased cues to complete tasks (forgetting to turn off water) and required increased time to perform task in standing while listening to PT.  Upper Body Bathing: Min guard;Sitting   Lower Body Bathing: Min guard;Sit to/from stand   Upper Body Dressing : Min guard;Sitting   Lower Body Dressing: Min guard;Sit to/from stand Lower Body Dressing Details (indicate cue type and reason): Pt donned socks with out physical A at EOB Toilet Transfer: Min guard;Ambulation;RW           Functional mobility during ADLs: Min guard;Rolling walker General ADL Comments: Pt performing ADLs and funcitonal mobility with RW at [facility] level. Pt requiring Min A for funcitonal mobility without RW due to slight posterior lean. Pt requiring increase time and cues to complete grooming tasks in standing with distractions     Vision Baseline Vision/History: Wears glasses Wears Glasses: Reading only (driving and reading) Patient Visual Report: No change from baseline       Perception     Praxis      Pertinent Vitals/Pain Pain Assessment: Faces Faces Pain Scale: No hurt Pain Intervention(s): Monitored during session     Hand Dominance Right  Extremity/Trunk Assessment Upper Extremity Assessment Upper Extremity Assessment: Generalized weakness   Lower Extremity Assessment Lower Extremity Assessment: Generalized weakness   Cervical / Trunk Assessment Cervical / Trunk Assessment: Kyphotic   Communication Communication Communication: No difficulties   Cognition Arousal/Alertness: Awake/alert Behavior During Therapy: WFL for tasks assessed/performed;Flat  affect Overall Cognitive Status: Within Functional Limits for tasks assessed (improvement from PT eval) Area of Impairment: Problem solving;Following commands                       Following Commands: Follows multi-step commands with increased time     Problem Solving: Requires verbal cues;Slow processing General Comments: Pt requiring assitional cues for completeing grooming at sink. Pt requiring increased cues for tasks with increased distractions in standing.    General Comments  VSS; pt denied dizziness or lightheadedness throughout session    Exercises     Shoulder Instructions      Home Living Family/patient expects to be discharged to:: Private residence Living Arrangements: Alone Available Help at Discharge: Family;Available PRN/intermittently Type of Home: Independent living facility Home Access: Level entry     Home Layout: One level     Bathroom Shower/Tub: Walk-in shower         Home Equipment: Walker - 2 wheels          Prior Functioning/Environment Level of Independence: Independent        Comments: Performing ADLs, IADLs, including driving, medication management, and simple cooking.         OT Problem List: Decreased activity tolerance;Impaired balance (sitting and/or standing);Decreased safety awareness;Decreased knowledge of use of DME or AE;Decreased knowledge of precautions;Decreased cognition      OT Treatment/Interventions: Self-care/ADL training;Therapeutic exercise;Energy conservation;DME and/or AE instruction;Therapeutic activities;Patient/family education    OT Goals(Current goals can be found in the care plan section) Acute Rehab OT Goals Patient Stated Goal: Would like to get back to her Independent Living Apartment OT Goal Formulation: With patient Time For Goal Achievement: 10/11/16 Potential to Achieve Goals: Good ADL Goals Pt Will Perform Grooming: with set-up;with supervision;standing (No verbal cues with minimal  distractions) Pt Will Perform Upper Body Dressing: with set-up;with supervision;sitting Pt Will Perform Lower Body Dressing: with set-up;with supervision;sit to/from stand Pt Will Transfer to Toilet: with min guard assist;ambulating;regular height toilet  OT Frequency: Min 2X/week   Barriers to D/C:            Co-evaluation PT/OT/SLP Co-Evaluation/Treatment: Yes Reason for Co-Treatment: For patient/therapist safety ((in anticipation of possible orthostasis) PT goals addressed during session: Mobility/safety with mobility OT goals addressed during session: ADL's and self-care      AM-PAC PT "6 Clicks" Daily Activity     Outcome Measure Help from another person eating meals?: None Help from another person taking care of personal grooming?: A Little Help from another person toileting, which includes using toliet, bedpan, or urinal?: A Little Help from another person bathing (including washing, rinsing, drying)?: A Little Help from another person to put on and taking off regular upper body clothing?: A Little Help from another person to put on and taking off regular lower body clothing?: A Little 6 Click Score: 19   End of Session Equipment Utilized During Treatment: Gait belt;Rolling walker Nurse Communication: Mobility status;Other (comment) (orthostatics; SpO2 100% roomair)  Activity Tolerance: Patient tolerated treatment well Patient left: in bed;with call bell/phone within reach;with nursing/sitter in room (with transport)  OT Visit Diagnosis: Unsteadiness on feet (R26.81);Other abnormalities of gait and mobility (R26.89);Muscle weakness (generalized) (M62.81);History  of falling (Z91.81)                Time: 3692-2300 OT Time Calculation (min): 32 min Charges:  OT General Charges $OT Visit: 1 Procedure OT Evaluation $OT Eval Low Complexity: 1 Procedure G-Codes:     Arika Mainer MSOT, OTR/L Acute Rehab Pager: [phone] Office: [place] 09/27/2016, 10:39 AM

## 2016-09-27 NOTE — Progress Notes (Signed)
  Echocardiogram 2D Echocardiogram has been performed.  Bobbye [place] 09/27/2016, 11:49 AM

## 2016-09-27 NOTE — Progress Notes (Signed)
Physical Therapy Treatment Patient Details Name: Deanna [name] MRN: [id number] DOB: 1930/10/27 Today's Date: 09/27/2016    History of Present Illness Pt is a 83 yo female admitted through ED on 09/23/16 with a fall and increased confusion. Pt was diagnosed with an AKI, orthostasis, hyponatremia, and hpertransminases. PMH significant for Depression, anxiety, HTN, DM2.     PT Comments    Continuing work on functional mobility and activity tolerance, and [name] showed significant improvements in functional mobility and activity tolerance from last session; Updated dc recommendations to home with HHPT follow up; see also Orthostatics below   Follow Up Recommendations  Home health PT;Supervision - Intermittent;Other (comment) ([place] follow up at ILF)     Equipment Recommendations  None recommended by PT    Recommendations for Other Services       Precautions / Restrictions Precautions Precautions: Fall Restrictions Weight Bearing Restrictions: No    Mobility  Bed Mobility Overal bed mobility: Needs Assistance Bed Mobility: Supine to Sit;Sit to Supine     Supine to sit: Min guard Sit to supine: Min guard   General bed mobility comments: Cues to initiate and fully complete task of sitting up; slow moving and inefficient, but able to get up without physical assist  Transfers Overall transfer level: Needs assistance Equipment used: 1 person hand held assist Transfers: Sit to/from Stand Sit to Stand: Min assist         General transfer comment: Much improved power up, min assist to steady; noted posterior bias and pt braced backs of LEs against bed for stability (indicative of higher fall risk)  Ambulation/Gait Ambulation/Gait assistance: Min assist Ambulation Distance (Feet): 10 Feet (in room to sink and back to bed to go to Radiology) Assistive device: None;Rolling walker (2 wheeled) Gait Pattern/deviations: Step-through pattern;Narrow base of support     General  Gait Details: min assist to walk to sink without assistive device; posterior loss of balance, requiring min assist to prevent fall; walk back to bed with RW, more steady   Stairs            Wheelchair Mobility    Modified Rankin (Stroke Patients Only)       Balance     Sitting balance-Leahy Scale: Good       Standing balance-Leahy Scale: Fair                              Cognition Arousal/Alertness: Awake/alert Behavior During Therapy: WFL for tasks assessed/performed;Flat affect Overall Cognitive Status: Within Functional Limits for tasks assessed (for simple mobility tasks)                                 General Comments: required questioning cues for completing ADL (see OT note)      Exercises      General Comments General comments (skin integrity, edema, etc.): Orthostatics as follows; pt denied dizziness or lightheadedness throughout session; eyes were closed more than I would have anticipated at sink, she tells me this is her habit   09/27/16 0900  Vital Signs  Patient Position (if appropriate) Orthostatic Vitals  Orthostatic Lying   BP- Lying 136/72  Pulse- Lying 79  Orthostatic Sitting  BP- Sitting 104/59  Pulse- Sitting 83  Orthostatic Standing at 0 minutes  BP- Standing at 0 minutes 114/59  Pulse- Standing at 0 minutes 89  Orthostatic Standing at 3  minutes  BP- Standing at 3 minutes 120/56  Pulse- Standing at 3 minutes 86         Pertinent Vitals/Pain Pain Assessment: Faces Faces Pain Scale: No hurt Pain Intervention(s): Monitored during session    Home Living Family/patient expects to be discharged to:: Private residence                    Prior Function Level of Independence: Independent      Comments: Performing ADLs, IADLs, including driving, medication management, and simple cooking.    PT Goals (current goals can now be found in the care plan section) Acute Rehab PT Goals Patient Stated Goal:  Would like to get back to her Independent Living Apartment PT Goal Formulation: With patient/family Time For Goal Achievement: 10/08/16 Potential to Achieve Goals: Good Progress towards PT goals: Goals met and updated - see care plan    Frequency    Min 3X/week      PT Plan Discharge plan needs to be updated;Frequency needs to be updated    Co-evaluation PT/OT/SLP Co-Evaluation/Treatment: Yes Reason for Co-Treatment: For patient/therapist safety (in anticipation of possible orthostasis) PT goals addressed during session: Mobility/safety with mobility        AM-PAC PT "6 Clicks" Daily Activity  Outcome Measure  Difficulty turning over in bed (including adjusting bedclothes, sheets and blankets)?: A Little Difficulty moving from lying on back to sitting on the side of the bed? : A Lot Difficulty sitting down on and standing up from a chair with arms (e.g., wheelchair, bedside commode, etc,.)?: A Lot Help needed moving to and from a bed to chair (including a wheelchair)?: A Little Help needed walking in hospital room?: A Little Help needed climbing 3-5 steps with a railing? : A Little 6 Click Score: 16    End of Session Equipment Utilized During Treatment: Gait belt Activity Tolerance: Patient tolerated treatment well Patient left: in bed;with call bell/phone within reach;Other (comment) (going to readiology) Nurse Communication: Mobility status PT Visit Diagnosis: Unsteadiness on feet (R26.81);Muscle weakness (generalized) (M62.81);Difficulty in walking, not elsewhere classified (R26.2)     Time: 9021-1155 PT Time Calculation (min) (ACUTE ONLY): 33 min  Charges:  $Gait Training: 8-22 mins                    G Codes:       Roney Marion, [place] Pager [phone] Office [place] 09/27/2016, 10:16 AM

## 2016-09-28 DIAGNOSIS — R6520 Severe sepsis without septic shock: Secondary | ICD-10-CM

## 2016-09-28 LAB — HEPATITIS PANEL, ACUTE
HCV Ab: <0.1 s/co ratio (ref 0.0–0.9)
Hep A IgM: NEGATIVE
Hep B C IgM: NEGATIVE
Hepatitis B Surface Ag: NEGATIVE

## 2016-09-28 LAB — COMPREHENSIVE METABOLIC PANEL
ALT: 59 U/L — ABNORMAL HIGH (ref 14–54)
AST: 46 U/L — ABNORMAL HIGH (ref 15–41)
Albumin: 2.5 g/dL — ABNORMAL LOW (ref 3.5–5.0)
Alkaline Phosphatase: 126 U/L (ref 38–126)
Anion gap: 10 (ref 5–15)
BUN: 29 mg/dL — ABNORMAL HIGH (ref 6–20)
CO2: 24 mmol/L (ref 22–32)
Calcium: 9.7 mg/dL (ref 8.9–10.3)
Chloride: 105 mmol/L (ref 101–111)
Creatinine, Ser: 1.34 mg/dL — ABNORMAL HIGH (ref 0.44–1.00)
GFR calc Af Amer: 41 mL/min — ABNORMAL LOW (ref >60)
GFR calc non Af Amer: 35 mL/min — ABNORMAL LOW (ref >60)
Glucose, Bld: 128 mg/dL — ABNORMAL HIGH (ref 65–99)
Potassium: 3.5 mmol/L (ref 3.5–5.1)
Sodium: 139 mmol/L (ref 135–145)
Total Bilirubin: 0.5 mg/dL (ref 0.3–1.2)
Total Protein: 6.6 g/dL (ref 6.5–8.1)

## 2016-09-28 LAB — GLUCOSE, CAPILLARY
Glucose-Capillary: 131 mg/dL — ABNORMAL HIGH (ref 65–99)
Glucose-Capillary: 154 mg/dL — ABNORMAL HIGH (ref 65–99)
Glucose-Capillary: 261 mg/dL — ABNORMAL HIGH (ref 65–99)
Glucose-Capillary: 220 mg/dL — ABNORMAL HIGH (ref 65–99)

## 2016-09-28 LAB — MAGNESIUM: Magnesium: 1.6 mg/dL — ABNORMAL LOW (ref 1.7–2.4)

## 2016-09-28 LAB — CBC
HCT: 32 % — ABNORMAL LOW (ref 36.0–46.0)
Hemoglobin: 10.7 g/dL — ABNORMAL LOW (ref 12.0–15.0)
MCH: 28.1 pg (ref 26.0–34.0)
MCHC: 33.4 g/dL (ref 30.0–36.0)
MCV: 84 fL (ref 78.0–100.0)
Platelets: 216 10*3/uL (ref 150–400)
RBC: 3.81 MIL/uL — ABNORMAL LOW (ref 3.87–5.11)
RDW: 13.8 % (ref 11.5–15.5)
WBC: 13.8 10*3/uL — ABNORMAL HIGH (ref 4.0–10.5)

## 2016-09-28 MED ORDER — ENSURE ENLIVE PO LIQD: 237.0000 mL | Freq: Three times a day (TID) | ORAL | 12 refills | Status: AC

## 2016-09-28 MED ORDER — CEPHALEXIN 500 MG PO CAPS: 500.0000 mg | ORAL_CAPSULE | Freq: Two times a day (BID) | ORAL | 0 refills | Status: AC

## 2016-09-28 MED ORDER — CEPHALEXIN 500 MG PO CAPS
500.0000 mg | ORAL_CAPSULE | Freq: Two times a day (BID) | ORAL | Status: DC
  Administered 2016-09-28: 500 mg via ORAL
  Filled 2016-09-28: qty 1

## 2016-09-28 NOTE — Care Management Important Message (Signed)
Important Message  Patient Details  Name: Deanna [name] MRN: [id number] Date of Birth: 12/27/1930   Medicare Important Message Given:  Yes    Katilyn Miltenberger, Rory Percy, RN 09/28/2016, 4:29 PM

## 2016-09-28 NOTE — Care Management Note (Signed)
Case Management Note  Patient Details  Name: Deanna [name] MRN: [id number] Date of Birth: 1930-03-11  Subjective/Objective:   CM following for progression and d/c planning.                  Action/Plan: 09/28/2016 Spoke with pt, family and Mitzi at [employer] who states that the pt may select her own [facility] agency as she is a resident of the independent living facility. Pt has selected [facility] for [facility] services. [facility] notified of orders and will provide services.   Expected Discharge Date:  09/28/16               Expected Discharge Plan:  [place]  In-House Referral:  NA  Discharge planning Services  CM Consult  Post Acute Care Choice:  Durable Medical Equipment, Home Health Choice offered to:  Patient  DME Arranged:  Walker rolling DME Agency:  [place] Arranged:  RN, PT [facility] Agency:  [place]  Status of Service:  Completed, signed off  If discussed at [place] of Stay Meetings, dates discussed:    Additional Comments:  Adron Bene, RN 09/28/2016, 3:19 PM

## 2016-09-28 NOTE — Care Management Important Message (Signed)
Important Message  Patient Details  Name: Deanna [name] MRN: [id number] Date of Birth: Oct 27, 1930   Medicare Important Message Given:  Yes    Burnette Sautter, Rory Percy, RN 09/28/2016, 4:33 PM

## 2016-09-28 NOTE — Discharge Summary (Signed)
Physician Discharge Summary  Deanna [name] EBR:[id number] DOB: 1931-01-01 DOA: 09/23/2016  PCP: Marton Redwood, MD  Admit date: 09/23/2016 Discharge date: 09/28/2016  Admitted From: Home Disposition:  Home  Recommendations for Outpatient Follow-up:  1. Follow up with PCP in 1- week 2. Patient has been placed on Cephalexin for 3 more days  Home Health: Yes  Equipment/Devices: NA    Discharge Condition: Stable  CODE STATUS: Full  Diet recommendation: Heart healthy and diabetic  Brief/Interim Summary: 83 year old female who presented with malaise, loss of appetite, and orthostatic symptoms. Patient is known to have depression, hypertension, and type 2 diabetes mellitus. Her symptoms have been ongoing for last week, significant decreased by mouth intake, she was evaluated by her primary care provider and found her to have a systolic blood pressure of 90 when sitting, down to 70 while standing, she was referred to the hospital for further evaluation. On initial physical examination blood pressure 112/50, heart rate 68, respiration rate 16 to 22, temperature 98.4, oxygen saturation 99%. Dry mucous membranes, lungs were clear to auscultation bilaterally, heart S1-S2 present and rhythmic, abdomen was soft nontender, lower extremities no edema, skin with poor turgor. Sodium 128, potassium 4.2, chloride 97, bicarbonate 20, glucose 101, BUN 47, creatinine 2.32, AST 181, ALT 78, white count 21.5, hemoglobin 10.3, hematocrit 31.4, platelets 161. TSH 4.77, urinalysis with too numerous to count white cells, 100 protein, large leukocytes. Chest x-ray was clear for infiltrates. Abdominal ultrasound with cholelithiasis with no wall thickening. Increased renal cortical echogenicity consistent with medical renal disease.   Patient was admitted to the hospital working diagnosis of acute kidney injury, likely prerenal, complicated by sepsis due tourinary tract infection, hyponatremia, and elevated  transaminases.   1. Sepsis due to urinary tract infection, present on admission. Patient was admitted to the medical unit, she was placed on a remote telemetry monitor, she received isotonic IV fluids, IV antibiotic therapy with IV ceftriaxone. Patient responded well to medical therapy, she has remained afebrile, her white cell count is 13.8, her blood pressure, systolic remain about 088. Her liver enzymes improved. Patient will complete 3 more days of cephalexin.  2. Acute hypoxic respiratory failure due to pulmonary edema due to volume overload. Patient developed pulmonary edema, acute, related to volume overload. IV fluids were discontinued, she received diuresis with improvement of her symptoms. Follow-up chest film with complete resolution of infiltrates. Echocardiography showed normal LV systolic function.  3. Acute kidney injury, prerenal. Patient was placed on IV fluids with improvement of kidney function. Her discharge creatinine is 1.34, potassium 3.5, serum bicarbonate 24. Urine output 800 mL over last 24 hours.   4. Acute urinary retention. Probably related to acute urinary tract infection, cystitis, Foley catheter was placed, the catheter will be removed before patient gets discharged.  5. Hypertension. Patient's blood pressure remained stable, she received initially fluids and then diuresis. Her last orthostatic vital signs on August 20, blood pressure systolic supine 110, standing for 3 minutes 120, , heart rate 79 supine, 86 standing. Patient may have orthostatic hypotension at baseline. Will recommend to continue carvedilol, to resume amlodipine/valsartan once blood pressure systolic greater than 315.   6. Type 2 diabetes mellitus. Patient was placed on insulin sliding scale for glucose coverage and monitoring, capillary glucose 150, 276, 150, 154 over last 24 hours. Kidney function has improved, calculated GFR greater than 30, metformin will be resumed.   7. Depression/anxiety. To  continue bupropion, duloxetine and nauseated alprazolam.   Discharge Diagnoses:  Principal Problem:  AKI (acute kidney injury) ([place]) Active Problems:   Loss of appetite   Orthostasis   Hyponatremia   Leukocytosis   Normocytic anemia   Depression with anxiety   Diabetes mellitus type II, non insulin dependent ([place])   Transaminasemia    Discharge Instructions   Allergies as of 09/28/2016      Reactions   Codeine Nausea Only      Medication List    STOP taking these medications   celecoxib 100 MG capsule Commonly known as:  CELEBREX     TAKE these medications   ALPRAZolam 0.25 MG tablet Commonly known as:  XANAX Take 0.25 mg by mouth 2 (two) times daily as needed for anxiety.   amLODipine-valsartan 5-160 MG tablet Commonly known as:  EXFORGE Take 1 tablet by mouth daily.   aspirin EC 81 MG tablet Take 81 mg by mouth daily.   buPROPion 150 MG 24 hr tablet Commonly known as:  WELLBUTRIN XL Take 150 mg by mouth daily.   carvedilol 12.5 MG tablet Commonly known as:  COREG Take 12.5 mg by mouth 2 (two) times daily with a meal.   cephALEXin 500 MG capsule Commonly known as:  KEFLEX Take 1 capsule (500 mg total) by mouth 2 (two) times daily.   cyclobenzaprine 5 MG tablet Commonly known as:  FLEXERIL Take 5 mg by mouth every 8 (eight) hours as needed for muscle spasms.   cycloSPORINE 0.05 % ophthalmic emulsion Commonly known as:  RESTASIS Place 1 drop into both eyes See admin instructions. Per patient she uses 6 times at day. Her daughter will verify   CYMBALTA PO Take 60 mg by mouth daily.   denosumab 60 MG/ML Soln injection Commonly known as:  PROLIA Inject 60 mg into the skin every 6 (six) months. Administer in upper arm, thigh, or abdomen   esomeprazole 40 MG capsule Commonly known as:  NEXIUM Take 40 mg by mouth daily at 12 noon.   feeding supplement (ENSURE ENLIVE) Liqd Take 237 mLs by mouth 3 (three) times daily between meals.   Hyoscyamine  Sulfate 0.375 MG Tbcr Take 1 tablet by mouth every 12 (twelve) hours as needed.   LYRICA 75 MG capsule Generic drug:  pregabalin Take 75 mg by mouth 2 (two) times daily.   metFORMIN 500 MG tablet Commonly known as:  GLUCOPHAGE Take 500 mg by mouth 2 (two) times daily.   MYRBETRIQ 25 MG Tb24 tablet Generic drug:  mirabegron ER Take 25 mg by mouth daily.   rosuvastatin 10 MG tablet Commonly known as:  CRESTOR Take 10 mg by mouth at bedtime.   traMADol 50 MG tablet Commonly known as:  ULTRAM Take 50 mg by mouth every 8 (eight) hours as needed.   triamcinolone cream 0.5 % Commonly known as:  KENALOG Apply 1 application topically 2 (two) times daily. Put on her feet   TYLENOL ARTHRITIS PAIN PO Take 650 mg by mouth at bedtime. What changed:  Another medication with the same name was removed. Continue taking this medication, and follow the directions you see here.   VITAMIN B 12 PO Take 1,000 mcg by mouth daily.   Vitamin D (Cholecalciferol) 400 units Tabs Take 400 Units by mouth 2 (two) times daily.   vitamin E 400 UNIT capsule Take 800 Units by mouth daily.       Allergies  Allergen Reactions  . Codeine Nausea Only    Consultations:     Procedures/Studies: Dg Chest 2 View  Result Date: 09/27/2016  CLINICAL DATA:  Dyspnea, follow-up airspace opacities and right pleural effusion. EXAM: CHEST  2 VIEW COMPARISON:  Portable chest x-ray of September 25, 2016 FINDINGS: The lungs are well-expanded. The interstitial markings have improved. Patchy alveolar opacities have resolved. Small amounts of pleural fluid blunt the posterior costophrenic angles. Heart and pulmonary vascularity are normal. There is calcification in the wall of the aortic arch. The patient has undergone previous left mastectomy and left axillary lymph node dissection. The bony thorax exhibits no acute abnormality. IMPRESSION: No discrete pneumonia. Improved appearance of the pulmonary interstitium. Persistent  small pleural effusions layering posteriorly. Thoracic aortic atherosclerosis. Electronically Signed   By: David  Martinique M.D.   On: 09/27/2016 09:57   Dg Chest 2 View  Result Date: 09/23/2016 CLINICAL DATA:  83 year old female with history weakness and fatigue for the past 4 days. EXAM: CHEST  2 VIEW COMPARISON:  Chest x-ray 04/13/2006. FINDINGS: Lung volumes are normal. No consolidative airspace disease. Trace left pleural effusion. No right pleural effusion. No pneumothorax. No pulmonary nodule or mass noted. Pulmonary vasculature and the cardiomediastinal silhouette are within normal limits. Atherosclerosis in the thoracic aorta. Status post left mastectomy and left axillary lymph node dissection. Orthopedic fixation hardware in the lower cervical spine. IMPRESSION: 1. Trace left pleural effusion. 2. Aortic atherosclerosis. Electronically Signed   By: Vinnie Langton M.D.   On: 09/23/2016 16:41   US Abdomen Complete  Result Date: 09/23/2016 CLINICAL DATA:  Acute renal failure.  Elevated LFTs. EXAM: ABDOMEN ULTRASOUND COMPLETE COMPARISON:  None. FINDINGS: Gallbladder: Cholelithiasis is seen in the gallbladder with the largest stone measuring 7 mm. No wall thickening, pericholecystic fluid, or Murphy's sign. Common bile duct: Diameter: 4 mm Liver: No focal lesion identified. Within normal limits in parenchymal echogenicity. IVC: No abnormality visualized. Pancreas: Visualized portion unremarkable. Spleen: Size and appearance within normal limits. Right Kidney: Length: 10.5 cm.  Increased cortical echogenicity. Left Kidney: Length: 9.9 cm.  Increased cortical echogenicity. Abdominal aorta: No aneurysm visualized. Other findings: None. IMPRESSION: 1. Cholelithiasis with no wall thickening, pericholecystic fluid, or Murphy's sign. 2. Increased cortical echogenicity associated with the kidneys suggests medical renal disease. Electronically Signed   By: Dorise Bullion III M.D   On: 09/23/2016 22:14   Dg  Chest Port 1 View  Result Date: 09/25/2016 CLINICAL DATA:  Shortness of breath.  History of breast cancer. EXAM: PORTABLE CHEST 1 VIEW COMPARISON:  Chest radiograph September 23, 2016 FINDINGS: Cardiac silhouette is normal. Patient rotated LEFT. Calcified aortic knob. Diffuse interstitial prominence with patchy perihilar airspace opacities. Small suspected RIGHT pleural effusion, LEFT costophrenic angle not completely imaged. RIGHT Kerley B-lines. Apical pleural thickening. No pneumothorax. ACDF. Surgical clips project in LEFT axilla, status post LEFT mastectomy. IMPRESSION: Increasing perihilar airspace opacities with interstitial prominence and small RIGHT pleural effusion seen with pulmonary edema, less likely pneumonia. Electronically Signed   By: Elon Alas M.D.   On: 09/25/2016 01:33       Subjective: Patient feeling better, no dyspnea, no chest pain, out of bed to the chair.   Discharge Exam: Vitals:   09/27/16 2150 09/28/16 0636  BP: 129/68 120/60  Pulse: 78 70  Resp: 17 16  Temp: 98.3 F (36.8 C) 98.5 F (36.9 C)  SpO2: 98% 100%   Vitals:   09/27/16 1500 09/27/16 1817 09/27/16 2150 09/28/16 0636  BP: 133/60 (!) 120/45 129/68 120/60  Pulse: 80 76 78 70  Resp: 18 18 17 16   Temp: 98 F (36.7 C) (!) 97.5 F (36.4 C)  98.3 F (36.8 C) 98.5 F (36.9 C)  TempSrc: Oral Oral Oral Oral  SpO2: 99% 100% 98% 100%  Weight:   51.7 kg (113 lb 14.9 oz)   Height:        General: Pt is alert, awake, not in acute distress E ENT: mild pallor, no icterus, oral mucosa moist.  Cardiovascular: RRR, S1/S2 +, no rubs, no gallops Respiratory: CTA bilaterally, no wheezing, no rhonchi Abdominal: Soft, NT, ND, bowel sounds + Extremities: no edema, no cyanosis    The results of significant diagnostics from this hospitalization (including imaging, microbiology, ancillary and laboratory) are listed below for reference.     Microbiology: Recent Results (from the past 240 hour(s))   Culture, blood (routine x 2)     Status: None (Preliminary result)   Collection Time: 09/24/16  7:53 PM  Result Value Ref Range Status   Specimen Description BLOOD RIGHT ARM  Final   Special Requests   Final    BOTTLES DRAWN AEROBIC AND ANAEROBIC Blood Culture adequate volume   Culture NO GROWTH 3 DAYS  Final   Report Status PENDING  Incomplete  Culture, blood (routine x 2)     Status: None (Preliminary result)   Collection Time: 09/26/16  9:15 AM  Result Value Ref Range Status   Specimen Description BLOOD RIGHT ANTECUBITAL  Final   Special Requests IN PEDIATRIC BOTTLE Blood Culture adequate volume  Final   Culture NO GROWTH 1 DAY  Final   Report Status PENDING  Incomplete  Culture, blood (routine x 2)     Status: None (Preliminary result)   Collection Time: 09/26/16  9:15 AM  Result Value Ref Range Status   Specimen Description BLOOD BLOOD RIGHT HAND  Final   Special Requests IN PEDIATRIC BOTTLE Blood Culture adequate volume  Final   Culture NO GROWTH 1 DAY  Final   Report Status PENDING  Incomplete  Culture, Urine     Status: None   Collection Time: 09/26/16 10:20 AM  Result Value Ref Range Status   Specimen Description URINE, CATHETERIZED  Final   Special Requests NONE  Final   Culture NO GROWTH  Final   Report Status 09/27/2016 FINAL  Final     Labs: BNP (last 3 results) No results for input(s): BNP in the last 8760 hours. Basic Metabolic Panel:  Recent Labs Lab 09/24/16 0322 09/25/16 0223 09/26/16 0257 09/27/16 0229 09/28/16 0300  NA 130* 138 137 137 139  K 4.9 3.8 3.4* 3.5 3.5  CL 104 110 104 104 105  CO2 16* 18* 22 23 24   GLUCOSE 132* 178* 130* 221* 128*  BUN 44* 25* 24* 30* 29*  CREATININE 2.03* 1.55* 1.51* 1.49* 1.34*  CALCIUM 8.8* 9.1 9.5 9.8 9.7  MG  --   --   --  1.7 1.6*   Liver Function Tests:  Recent Labs Lab 09/23/16 1553 09/24/16 0322 09/26/16 0257 09/27/16 0229 09/28/16 0300  AST 181* 202* 54* 59* 46*  ALT 78* 104* 70* 68* 59*   ALKPHOS 125 169* 153* 140* 126  BILITOT 0.9 1.0 0.6 0.6 0.5  PROT 6.9 6.0* 6.8 6.6 6.6  ALBUMIN 3.0* 2.7* 2.5* 2.5* 2.5*   No results for input(s): LIPASE, AMYLASE in the last 168 hours. No results for input(s): AMMONIA in the last 168 hours. CBC:  Recent Labs Lab 09/23/16 1553  09/24/16 1256 09/25/16 0223 09/26/16 0257 09/27/16 0229 09/28/16 0300  WBC 21.5*  < > 15.9* 16.6* 13.9* 12.6* 13.8*  NEUTROABS 14.7*  --  12.6* 14.1* 8.1*  --   --   HGB 10.3*  < > 11.5* 10.7* 11.0* 10.8* 10.7*  HCT 31.4*  < > 34.7* 31.6* 31.9* 32.3* 32.0*  MCV 86.0  < > 85.3 83.8 83.1 83.0 84.0  PLT 161  < > 173 165 153 208 216  < > = values in this interval not displayed. Cardiac Enzymes: No results for input(s): CKTOTAL, CKMB, CKMBINDEX, TROPONINI in the last 168 hours. BNP: Invalid input(s): POCBNP CBG:  Recent Labs Lab 09/26/16 2112 09/27/16 0751 09/27/16 1210 09/27/16 1653 09/28/16 0744  GLUCAP 162* 150* 276* 155* 154*   D-Dimer No results for input(s): DDIMER in the last 72 hours. Hgb A1c No results for input(s): HGBA1C in the last 72 hours. Lipid Profile No results for input(s): CHOL, HDL, LDLCALC, TRIG, CHOLHDL, LDLDIRECT in the last 72 hours. Thyroid function studies No results for input(s): TSH, T4TOTAL, T3FREE, THYROIDAB in the last 72 hours.  Invalid input(s): FREET3 Anemia work up No results for input(s): VITAMINB12, FOLATE, FERRITIN, TIBC, IRON, RETICCTPCT in the last 72 hours. Urinalysis    Component Value Date/Time   COLORURINE YELLOW 09/23/2016 2109   APPEARANCEUR CLOUDY (A) 09/23/2016 2109   LABSPEC 1.010 09/23/2016 2109   PHURINE 5.0 09/23/2016 2109   GLUCOSEU NEGATIVE 09/23/2016 2109   HGBUR MODERATE (A) 09/23/2016 2109   BILIRUBINUR NEGATIVE 09/23/2016 2109   [place] NEGATIVE 09/23/2016 2109   PROTEINUR 100 (A) 09/23/2016 2109   UROBILINOGEN 0.2 08/07/2006 1818   NITRITE NEGATIVE 09/23/2016 2109   LEUKOCYTESUR LARGE (A) 09/23/2016 2109   Sepsis  Labs Invalid input(s): PROCALCITONIN,  WBC,  LACTICIDVEN Microbiology Recent Results (from the past 240 hour(s))  Culture, blood (routine x 2)     Status: None (Preliminary result)   Collection Time: 09/24/16  7:53 PM  Result Value Ref Range Status   Specimen Description BLOOD RIGHT ARM  Final   Special Requests   Final    BOTTLES DRAWN AEROBIC AND ANAEROBIC Blood Culture adequate volume   Culture NO GROWTH 3 DAYS  Final   Report Status PENDING  Incomplete  Culture, blood (routine x 2)     Status: None (Preliminary result)   Collection Time: 09/26/16  9:15 AM  Result Value Ref Range Status   Specimen Description BLOOD RIGHT ANTECUBITAL  Final   Special Requests IN PEDIATRIC BOTTLE Blood Culture adequate volume  Final   Culture NO GROWTH 1 DAY  Final   Report Status PENDING  Incomplete  Culture, blood (routine x 2)     Status: None (Preliminary result)   Collection Time: 09/26/16  9:15 AM  Result Value Ref Range Status   Specimen Description BLOOD BLOOD RIGHT HAND  Final   Special Requests IN PEDIATRIC BOTTLE Blood Culture adequate volume  Final   Culture NO GROWTH 1 DAY  Final   Report Status PENDING  Incomplete  Culture, Urine     Status: None   Collection Time: 09/26/16 10:20 AM  Result Value Ref Range Status   Specimen Description URINE, CATHETERIZED  Final   Special Requests NONE  Final   Culture NO GROWTH  Final   Report Status 09/27/2016 FINAL  Final     Time coordinating discharge: 45 minutes  SIGNED:   Tawni Millers, MD  [facility] 09/28/2016, 8:01 AM Pager [phone]  If 7PM-7AM, please contact night-coverage [email or web address] Password [email or web address]

## 2016-09-28 NOTE — Progress Notes (Signed)
Physical Therapy Treatment Patient Details Name: Deanna [name] MRN: [id number] DOB: 1930/09/18 Today's Date: 09/28/2016    History of Present Illness Pt is a 83 yo female admitted through ED on 09/23/16 with a fall and increased confusion. Pt was diagnosed with an AKI, orthostasis, hyponatremia, and hpertransminases. PMH significant for Depression, anxiety, HTN, DM2.     PT Comments    Continuing work on functional mobility and activity tolerance;  Overall steady with amb greater than household distance with RW; Needs reinforcement of safe hand placement with RW; we discussed working with HHPT post dc   Follow Up Recommendations  Home health PT;Supervision - Intermittent;Other (comment) (HH follow up at [facility])     Equipment Recommendations  None recommended by PT    Recommendations for Other Services       Precautions / Restrictions Precautions Precautions: Fall    Mobility  Bed Mobility Overal bed mobility: Needs Assistance Bed Mobility: Supine to Sit     Supine to sit: Supervision     General bed mobility comments: Used bed rail; smoother transition than yesterday  Transfers Overall transfer level: Needs assistance Equipment used: Rolling walker (2 wheeled) Transfers: Sit to/from Stand Sit to Stand: Min guard         General transfer comment: Cues for hand placement and safety  Ambulation/Gait Ambulation/Gait assistance: Min guard (without physical contact) Ambulation Distance (Feet): 200 Feet Assistive device: Rolling walker (2 wheeled) Gait Pattern/deviations: Step-through pattern;Narrow base of support     General Gait Details: Cues to self-monitor for activity tolerance; good use of RW for steadiness   Stairs            Wheelchair Mobility    Modified Rankin (Stroke Patients Only)       Balance     Sitting balance-Leahy Scale: Good       Standing balance-Leahy Scale: Fair                              Cognition  Arousal/Alertness: Awake/alert Behavior During Therapy: WFL for tasks assessed/performed Overall Cognitive Status: Within Functional Limits for tasks assessed (for simple mobility tasks)                                        Exercises      General Comments General comments (skin integrity, edema, etc.): VSS; pt denied dizziness or lightheadedness throughout session      Pertinent Vitals/Pain Pain Assessment: Faces Faces Pain Scale: No hurt Pain Intervention(s): Monitored during session    Home Living                      Prior Function            PT Goals (current goals can now be found in the care plan section) Acute Rehab PT Goals Patient Stated Goal: Would like to get back to her Independent Living Apartment PT Goal Formulation: With patient/family Time For Goal Achievement: 10/08/16 Potential to Achieve Goals: Good Progress towards PT goals: Progressing toward goals    Frequency    Min 3X/week      PT Plan Current plan remains appropriate    Co-evaluation              AM-PAC PT "6 Clicks" Daily Activity  Outcome Measure  Difficulty turning over in bed (including adjusting  bedclothes, sheets and blankets)?: None Difficulty moving from lying on back to sitting on the side of the bed? : None Difficulty sitting down on and standing up from a chair with arms (e.g., wheelchair, bedside commode, etc,.)?: A Little Help needed moving to and from a bed to chair (including a wheelchair)?: None Help needed walking in hospital room?: None Help needed climbing 3-5 steps with a railing? : A Little 6 Click Score: 22    End of Session Equipment Utilized During Treatment: Gait belt Activity Tolerance: Patient tolerated treatment well Patient left: in chair;with call bell/phone within reach;with family/visitor present Nurse Communication: Mobility status PT Visit Diagnosis: Unsteadiness on feet (R26.81);Muscle weakness (generalized)  (M62.81);Difficulty in walking, not elsewhere classified (R26.2)     Time: 1031-5945 PT Time Calculation (min) (ACUTE ONLY): 22 min  Charges:  $Gait Training: 8-22 mins                    G Codes:       Roney Marion, PT  Acute Rehabilitation Services Pager [phone] Office [place] 09/28/2016, 2:12 PM

## 2016-09-29 LAB — CULTURE, BLOOD (ROUTINE X 2)
Culture: NO GROWTH
Special Requests: ADEQUATE

## 2016-10-01 LAB — CULTURE, BLOOD (ROUTINE X 2)
Culture: NO GROWTH
Culture: NO GROWTH
Special Requests: ADEQUATE
Special Requests: ADEQUATE

## 2016-10-02 DIAGNOSIS — R262 Difficulty in walking, not elsewhere classified: Secondary | ICD-10-CM | POA: Diagnosis not present

## 2016-10-02 DIAGNOSIS — M545 Low back pain: Secondary | ICD-10-CM | POA: Diagnosis not present

## 2016-10-02 DIAGNOSIS — I951 Orthostatic hypotension: Secondary | ICD-10-CM | POA: Diagnosis not present

## 2016-10-02 DIAGNOSIS — M6281 Muscle weakness (generalized): Secondary | ICD-10-CM | POA: Diagnosis not present

## 2016-10-02 DIAGNOSIS — N390 Urinary tract infection, site not specified: Secondary | ICD-10-CM | POA: Diagnosis not present

## 2016-10-04 DIAGNOSIS — N390 Urinary tract infection, site not specified: Secondary | ICD-10-CM | POA: Diagnosis not present

## 2016-10-04 DIAGNOSIS — D72829 Elevated white blood cell count, unspecified: Secondary | ICD-10-CM | POA: Diagnosis not present

## 2016-10-05 DIAGNOSIS — I951 Orthostatic hypotension: Secondary | ICD-10-CM | POA: Diagnosis not present

## 2016-10-05 DIAGNOSIS — M545 Low back pain: Secondary | ICD-10-CM | POA: Diagnosis not present

## 2016-10-05 DIAGNOSIS — R262 Difficulty in walking, not elsewhere classified: Secondary | ICD-10-CM | POA: Diagnosis not present

## 2016-10-05 DIAGNOSIS — N390 Urinary tract infection, site not specified: Secondary | ICD-10-CM | POA: Diagnosis not present

## 2016-10-05 DIAGNOSIS — M6281 Muscle weakness (generalized): Secondary | ICD-10-CM | POA: Diagnosis not present

## 2016-10-08 DIAGNOSIS — R262 Difficulty in walking, not elsewhere classified: Secondary | ICD-10-CM | POA: Diagnosis not present

## 2016-10-08 DIAGNOSIS — M6281 Muscle weakness (generalized): Secondary | ICD-10-CM | POA: Diagnosis not present

## 2016-10-08 DIAGNOSIS — I951 Orthostatic hypotension: Secondary | ICD-10-CM | POA: Diagnosis not present

## 2016-10-08 DIAGNOSIS — N390 Urinary tract infection, site not specified: Secondary | ICD-10-CM | POA: Diagnosis not present

## 2016-10-08 DIAGNOSIS — M545 Low back pain: Secondary | ICD-10-CM | POA: Diagnosis not present

## 2016-10-12 DIAGNOSIS — R262 Difficulty in walking, not elsewhere classified: Secondary | ICD-10-CM | POA: Diagnosis not present

## 2016-10-12 DIAGNOSIS — N390 Urinary tract infection, site not specified: Secondary | ICD-10-CM | POA: Diagnosis not present

## 2016-10-12 DIAGNOSIS — M6281 Muscle weakness (generalized): Secondary | ICD-10-CM | POA: Diagnosis not present

## 2016-10-12 DIAGNOSIS — I951 Orthostatic hypotension: Secondary | ICD-10-CM | POA: Diagnosis not present

## 2016-10-12 DIAGNOSIS — M545 Low back pain: Secondary | ICD-10-CM | POA: Diagnosis not present

## 2016-10-14 DIAGNOSIS — M6281 Muscle weakness (generalized): Secondary | ICD-10-CM | POA: Diagnosis not present

## 2016-10-14 DIAGNOSIS — N390 Urinary tract infection, site not specified: Secondary | ICD-10-CM | POA: Diagnosis not present

## 2016-10-14 DIAGNOSIS — I951 Orthostatic hypotension: Secondary | ICD-10-CM | POA: Diagnosis not present

## 2016-10-14 DIAGNOSIS — R262 Difficulty in walking, not elsewhere classified: Secondary | ICD-10-CM | POA: Diagnosis not present

## 2016-10-14 DIAGNOSIS — M545 Low back pain: Secondary | ICD-10-CM | POA: Diagnosis not present

## 2016-10-15 DIAGNOSIS — N390 Urinary tract infection, site not specified: Secondary | ICD-10-CM | POA: Diagnosis not present

## 2016-10-15 DIAGNOSIS — M545 Low back pain: Secondary | ICD-10-CM | POA: Diagnosis not present

## 2016-10-15 DIAGNOSIS — I951 Orthostatic hypotension: Secondary | ICD-10-CM | POA: Diagnosis not present

## 2016-10-15 DIAGNOSIS — M6281 Muscle weakness (generalized): Secondary | ICD-10-CM | POA: Diagnosis not present

## 2016-10-15 DIAGNOSIS — R262 Difficulty in walking, not elsewhere classified: Secondary | ICD-10-CM | POA: Diagnosis not present

## 2016-10-20 DIAGNOSIS — I951 Orthostatic hypotension: Secondary | ICD-10-CM | POA: Diagnosis not present

## 2016-10-20 DIAGNOSIS — M545 Low back pain: Secondary | ICD-10-CM | POA: Diagnosis not present

## 2016-10-20 DIAGNOSIS — R262 Difficulty in walking, not elsewhere classified: Secondary | ICD-10-CM | POA: Diagnosis not present

## 2016-10-20 DIAGNOSIS — M6281 Muscle weakness (generalized): Secondary | ICD-10-CM | POA: Diagnosis not present

## 2016-10-20 DIAGNOSIS — N390 Urinary tract infection, site not specified: Secondary | ICD-10-CM | POA: Diagnosis not present

## 2016-10-22 DIAGNOSIS — M545 Low back pain: Secondary | ICD-10-CM | POA: Diagnosis not present

## 2016-10-22 DIAGNOSIS — M6281 Muscle weakness (generalized): Secondary | ICD-10-CM | POA: Diagnosis not present

## 2016-10-22 DIAGNOSIS — R262 Difficulty in walking, not elsewhere classified: Secondary | ICD-10-CM | POA: Diagnosis not present

## 2016-10-22 DIAGNOSIS — I951 Orthostatic hypotension: Secondary | ICD-10-CM | POA: Diagnosis not present

## 2016-10-22 DIAGNOSIS — N390 Urinary tract infection, site not specified: Secondary | ICD-10-CM | POA: Diagnosis not present

## 2016-10-27 DIAGNOSIS — N183 Chronic kidney disease, stage 3 (moderate): Secondary | ICD-10-CM | POA: Diagnosis not present

## 2016-10-27 DIAGNOSIS — D72829 Elevated white blood cell count, unspecified: Secondary | ICD-10-CM | POA: Diagnosis not present

## 2016-10-27 DIAGNOSIS — R748 Abnormal levels of other serum enzymes: Secondary | ICD-10-CM | POA: Diagnosis not present

## 2016-10-27 DIAGNOSIS — M6281 Muscle weakness (generalized): Secondary | ICD-10-CM | POA: Diagnosis not present

## 2016-10-27 DIAGNOSIS — I951 Orthostatic hypotension: Secondary | ICD-10-CM | POA: Diagnosis not present

## 2016-10-27 DIAGNOSIS — I10 Essential (primary) hypertension: Secondary | ICD-10-CM | POA: Diagnosis not present

## 2016-10-27 DIAGNOSIS — R2689 Other abnormalities of gait and mobility: Secondary | ICD-10-CM | POA: Diagnosis not present

## 2016-10-27 DIAGNOSIS — M5380 Other specified dorsopathies, site unspecified: Secondary | ICD-10-CM | POA: Diagnosis not present

## 2016-10-27 DIAGNOSIS — M545 Low back pain: Secondary | ICD-10-CM | POA: Diagnosis not present

## 2016-10-27 DIAGNOSIS — K219 Gastro-esophageal reflux disease without esophagitis: Secondary | ICD-10-CM | POA: Diagnosis not present

## 2016-10-27 DIAGNOSIS — R531 Weakness: Secondary | ICD-10-CM | POA: Diagnosis not present

## 2016-10-27 DIAGNOSIS — N3941 Urge incontinence: Secondary | ICD-10-CM | POA: Diagnosis not present

## 2016-10-27 DIAGNOSIS — R262 Difficulty in walking, not elsewhere classified: Secondary | ICD-10-CM | POA: Diagnosis not present

## 2016-10-27 DIAGNOSIS — E1149 Type 2 diabetes mellitus with other diabetic neurological complication: Secondary | ICD-10-CM | POA: Diagnosis not present

## 2016-10-27 DIAGNOSIS — N390 Urinary tract infection, site not specified: Secondary | ICD-10-CM | POA: Diagnosis not present

## 2016-10-28 DIAGNOSIS — R262 Difficulty in walking, not elsewhere classified: Secondary | ICD-10-CM | POA: Diagnosis not present

## 2016-10-28 DIAGNOSIS — I951 Orthostatic hypotension: Secondary | ICD-10-CM | POA: Diagnosis not present

## 2016-10-28 DIAGNOSIS — N390 Urinary tract infection, site not specified: Secondary | ICD-10-CM | POA: Diagnosis not present

## 2016-10-28 DIAGNOSIS — M6281 Muscle weakness (generalized): Secondary | ICD-10-CM | POA: Diagnosis not present

## 2016-10-28 DIAGNOSIS — M545 Low back pain: Secondary | ICD-10-CM | POA: Diagnosis not present

## 2016-10-29 DIAGNOSIS — I951 Orthostatic hypotension: Secondary | ICD-10-CM | POA: Diagnosis not present

## 2016-10-29 DIAGNOSIS — N390 Urinary tract infection, site not specified: Secondary | ICD-10-CM | POA: Diagnosis not present

## 2016-10-29 DIAGNOSIS — M545 Low back pain: Secondary | ICD-10-CM | POA: Diagnosis not present

## 2016-10-29 DIAGNOSIS — M6281 Muscle weakness (generalized): Secondary | ICD-10-CM | POA: Diagnosis not present

## 2016-10-29 DIAGNOSIS — R262 Difficulty in walking, not elsewhere classified: Secondary | ICD-10-CM | POA: Diagnosis not present

## 2016-11-04 DIAGNOSIS — F331 Major depressive disorder, recurrent, moderate: Secondary | ICD-10-CM | POA: Diagnosis not present

## 2016-11-23 DIAGNOSIS — R300 Dysuria: Secondary | ICD-10-CM | POA: Diagnosis not present

## 2016-11-23 DIAGNOSIS — N390 Urinary tract infection, site not specified: Secondary | ICD-10-CM | POA: Diagnosis not present

## 2016-11-24 DIAGNOSIS — R300 Dysuria: Secondary | ICD-10-CM | POA: Diagnosis not present

## 2016-12-07 DIAGNOSIS — M810 Age-related osteoporosis without current pathological fracture: Secondary | ICD-10-CM | POA: Diagnosis not present

## 2016-12-07 DIAGNOSIS — I10 Essential (primary) hypertension: Secondary | ICD-10-CM | POA: Diagnosis not present

## 2016-12-07 DIAGNOSIS — E7849 Other hyperlipidemia: Secondary | ICD-10-CM | POA: Diagnosis not present

## 2016-12-07 DIAGNOSIS — R82998 Other abnormal findings in urine: Secondary | ICD-10-CM | POA: Diagnosis not present

## 2016-12-07 DIAGNOSIS — E1149 Type 2 diabetes mellitus with other diabetic neurological complication: Secondary | ICD-10-CM | POA: Diagnosis not present

## 2016-12-13 DIAGNOSIS — Z681 Body mass index (BMI) 19 or less, adult: Secondary | ICD-10-CM | POA: Diagnosis not present

## 2016-12-13 DIAGNOSIS — E1129 Type 2 diabetes mellitus with other diabetic kidney complication: Secondary | ICD-10-CM | POA: Diagnosis not present

## 2016-12-13 DIAGNOSIS — M542 Cervicalgia: Secondary | ICD-10-CM | POA: Diagnosis not present

## 2016-12-13 DIAGNOSIS — I10 Essential (primary) hypertension: Secondary | ICD-10-CM | POA: Diagnosis not present

## 2016-12-13 DIAGNOSIS — F3341 Major depressive disorder, recurrent, in partial remission: Secondary | ICD-10-CM | POA: Diagnosis not present

## 2016-12-13 DIAGNOSIS — N183 Chronic kidney disease, stage 3 (moderate): Secondary | ICD-10-CM | POA: Diagnosis not present

## 2016-12-13 DIAGNOSIS — E7849 Other hyperlipidemia: Secondary | ICD-10-CM | POA: Diagnosis not present

## 2016-12-13 DIAGNOSIS — E1139 Type 2 diabetes mellitus with other diabetic ophthalmic complication: Secondary | ICD-10-CM | POA: Diagnosis not present

## 2016-12-13 DIAGNOSIS — Z0000 Encounter for general adult medical examination without abnormal findings: Secondary | ICD-10-CM | POA: Diagnosis not present

## 2016-12-13 DIAGNOSIS — E11319 Type 2 diabetes mellitus with unspecified diabetic retinopathy without macular edema: Secondary | ICD-10-CM | POA: Diagnosis not present

## 2016-12-13 DIAGNOSIS — M810 Age-related osteoporosis without current pathological fracture: Secondary | ICD-10-CM | POA: Diagnosis not present

## 2016-12-13 DIAGNOSIS — E1149 Type 2 diabetes mellitus with other diabetic neurological complication: Secondary | ICD-10-CM | POA: Diagnosis not present

## 2016-12-16 DIAGNOSIS — Z1212 Encounter for screening for malignant neoplasm of rectum: Secondary | ICD-10-CM | POA: Diagnosis not present

## 2017-01-11 DIAGNOSIS — R2689 Other abnormalities of gait and mobility: Secondary | ICD-10-CM | POA: Diagnosis not present

## 2017-01-11 DIAGNOSIS — R531 Weakness: Secondary | ICD-10-CM | POA: Diagnosis not present

## 2017-01-11 DIAGNOSIS — I447 Left bundle-branch block, unspecified: Secondary | ICD-10-CM | POA: Diagnosis not present

## 2017-01-11 DIAGNOSIS — I10 Essential (primary) hypertension: Secondary | ICD-10-CM | POA: Diagnosis not present

## 2017-01-11 DIAGNOSIS — E875 Hyperkalemia: Secondary | ICD-10-CM | POA: Diagnosis not present

## 2017-01-11 DIAGNOSIS — N390 Urinary tract infection, site not specified: Secondary | ICD-10-CM | POA: Diagnosis not present

## 2017-01-11 DIAGNOSIS — R42 Dizziness and giddiness: Secondary | ICD-10-CM | POA: Diagnosis not present

## 2017-01-14 DIAGNOSIS — E875 Hyperkalemia: Secondary | ICD-10-CM | POA: Diagnosis not present

## 2017-01-14 DIAGNOSIS — R531 Weakness: Secondary | ICD-10-CM | POA: Diagnosis not present

## 2017-01-14 DIAGNOSIS — I10 Essential (primary) hypertension: Secondary | ICD-10-CM | POA: Diagnosis not present

## 2017-01-26 DIAGNOSIS — H35072 Retinal telangiectasis, left eye: Secondary | ICD-10-CM | POA: Diagnosis not present

## 2017-01-26 DIAGNOSIS — H43822 Vitreomacular adhesion, left eye: Secondary | ICD-10-CM | POA: Diagnosis not present

## 2017-01-26 DIAGNOSIS — H35071 Retinal telangiectasis, right eye: Secondary | ICD-10-CM | POA: Diagnosis not present

## 2017-01-26 DIAGNOSIS — H43821 Vitreomacular adhesion, right eye: Secondary | ICD-10-CM | POA: Diagnosis not present

## 2017-01-26 DIAGNOSIS — H3562 Retinal hemorrhage, left eye: Secondary | ICD-10-CM | POA: Diagnosis not present

## 2017-09-02 ENCOUNTER — Telehealth (INDEPENDENT_AMBULATORY_CARE_PROVIDER_SITE_OTHER): Payer: Self-pay | Admitting: Orthopaedic Surgery

## 2017-09-02 NOTE — Telephone Encounter (Signed)
Returned call to patient left message to return call °

## 2017-09-09 ENCOUNTER — Ambulatory Visit (INDEPENDENT_AMBULATORY_CARE_PROVIDER_SITE_OTHER): Payer: Medicare Other | Admitting: Orthopaedic Surgery

## 2017-09-09 ENCOUNTER — Encounter (INDEPENDENT_AMBULATORY_CARE_PROVIDER_SITE_OTHER): Payer: Self-pay | Admitting: Orthopaedic Surgery

## 2017-09-09 ENCOUNTER — Ambulatory Visit (INDEPENDENT_AMBULATORY_CARE_PROVIDER_SITE_OTHER): Payer: Self-pay

## 2017-09-09 VITALS — BP 136/67 | HR 56 | Ht 62.0 in | Wt 107.0 lb

## 2017-09-09 DIAGNOSIS — M545 Low back pain: Secondary | ICD-10-CM | POA: Diagnosis not present

## 2017-09-09 DIAGNOSIS — M542 Cervicalgia: Secondary | ICD-10-CM | POA: Diagnosis not present

## 2017-09-09 DIAGNOSIS — G8929 Other chronic pain: Secondary | ICD-10-CM

## 2017-09-10 ENCOUNTER — Encounter (INDEPENDENT_AMBULATORY_CARE_PROVIDER_SITE_OTHER): Payer: Self-pay | Admitting: Orthopaedic Surgery

## 2017-09-10 NOTE — Progress Notes (Signed)
Office Visit Note   Patient: Deanna [name]           Date of Birth: 04-May-1930           MRN: [id number] Visit Date: 09/09/2017              Requested by: Marton Redwood, MD [address] [place], [place] [address] PCP: Marton Redwood, MD   Assessment & Plan: Visit Diagnoses:  1. Neck pain   2. Chronic midline low back pain, with sciatica presence unspecified     Plan: I encouraged her to use a cane she needs to work on lower extremity strengthening and offered referring her for formal physical therapy.  She like to work with the exercise trainer teaches wellness classes at her assisted living facility.  I wrote her a prescription for lower extremity strengthening balance, fall prevention and core strengthening and she can call a local physical therapist where she lives if she wants to follow the recommendations.  I plan to recheck her back in 2 months.  Follow-Up Instructions: Return in about 2 months (around 11/09/2017).   Orders:  Orders Placed This Encounter  Procedures  . XR Cervical Spine 2 or 3 views  . XR Lumbar Spine 2-3 Views   No orders of the defined types were placed in this encounter.     Procedures: No procedures performed   Clinical Data: No additional findings.   Subjective: Chief Complaint  Patient presents with  . Neck - Pain  . Lower Back - Pain    HPI 84 year old female who lost her husband 6 weeks ago after extended multiyear cancer bilateral is seen with worsening back pain as well as some neck pain.  She has neuropathy in her feet some intermittent numbness in her hands not related to her neck.  She notices neck stiffness with turning and twisting.  Previous C4-C7 fusion with plating which she had healed solidly.  2006 bilateral L5-S1 microdiscectomy.  She complains of stiffness with her back denies neurogenic claudication symptoms.  She has had loss of appetite while grieving with associated depression.  No associated bowel or bladder  symptoms.  She is in assisted living and does not exercise class about twice a week.  Review of Systems is systems positive for history of anemia, depression, anxiety, appetite loss, previous L5-S1 microdiscectomy 2006, C4-C7 fusion.  Type 2 diabetes non-insulin-dependent.  History of elevated liver function test.   Objective: Vital Signs: BP 136/67   Pulse (!) 56   Ht 5\' 2"  (1.575 m)   Wt 107 lb (48.5 kg)   BMI 19.57 kg/m   Physical Exam  Constitutional: She is oriented to [name], place, and time. She appears well-developed.  HENT:  Head: Normocephalic.  Right Ear: External ear normal.  Left Ear: External ear normal.  Eyes: Pupils are equal, round, and reactive to light.  Neck: No tracheal deviation present. No thyromegaly present.  Cardiovascular: Normal rate.  Pulmonary/Chest: Effort normal.  Abdominal: Soft.  Neurological: She is alert and oriented to [name], place, and time.  Skin: Skin is warm and dry.  Psychiatric: She has a normal mood and affect. Her behavior is normal.    Ortho Exam patient has some mild brachial plexus tenderness.  50% limitation of rotation right and left.  Upper extremity reflexes are 2+.  She has some decreased strength biceps triceps flexion-extension symmetrical.  Normal heel toe gait.  Well-healed lumbar incision no sciatic notch tenderness.  She does have some trochanteric bursal tenderness  which is symmetrical.  Anterior tib is intact knee and ankle jerk are intact.  Her gait is slow deliberate sometimes with stride she steps across the midline and has trouble walking a straight line.  Specialty Comments:  No specialty comments available.  Imaging: Xr Cervical Spine 2 Or 3 Views  Result Date: 09/10/2017 AP lateral cervical spine x-rays are obtained and reviewed.  This shows C4-C7 fusion with anterior plating.  There is disc narrowing and spurring at C3-4 above the fusion as well as at C7-T1 below the fusion.  No acute bony changes noted.  Impression: Previous C4-C7 fusion.  Adjacent degenerative changes noted.  Xr Lumbar Spine 2-3 Views  Result Date: 09/10/2017 AP lateral lumbar spine x-rays obtained.  This shows left lumbar curve less than 15 degrees.  There is grade 1 anterolisthesis at L4-5 and L5-S1 with endplate spurring and facet arthropathy.  Calcification of the abdominal aorta.  Sacroiliac joints and hips appear normal. Impression: Lumbar spondylosis with degenerative anterolisthesis at L4-5 L5-S1.    PMFS History: Patient Active Problem List   Diagnosis Date Noted  . Loss of appetite 09/23/2016  . AKI (acute kidney injury) ([place]) 09/23/2016  . Orthostasis 09/23/2016  . Hyponatremia 09/23/2016  . Leukocytosis 09/23/2016  . Normocytic anemia 09/23/2016  . Depression with anxiety 09/23/2016  . Diabetes mellitus type II, non insulin dependent ([place]) 09/23/2016  . Transaminasemia 09/23/2016  . Dehydration   . Elevated LFTs   . Chronic midline low back pain without sciatica 01/13/2016   Past Medical History:  Diagnosis Date  . Arthritis    all over  . Cancer ([place])    breast  . Depression   . Diabetes mellitus without complication (HCC)    diet controlled  . Hypertension     No family history on file.  Past Surgical History:  Procedure Laterality Date  . APPENDECTOMY     with lower back surgery  . BACK SURGERY     x2  . BREAST SURGERY Left 1995   complete left mastectomy  . CERVICAL SPINE SURGERY    . EYE SURGERY Bilateral    cataracts  . TONSILLECTOMY     at age 75   Social History   Occupational History  . Not on file  Tobacco Use  . Smoking status: Never Smoker  . Smokeless tobacco: Never Used  Substance and Sexual Activity  . Alcohol use: Not on file  . Drug use: Not on file  . Sexual activity: Not on file

## 2017-11-11 ENCOUNTER — Ambulatory Visit (INDEPENDENT_AMBULATORY_CARE_PROVIDER_SITE_OTHER): Payer: Medicare Other | Admitting: Orthopaedic Surgery

## 2018-02-07 DIAGNOSIS — H3562 Retinal hemorrhage, left eye: Secondary | ICD-10-CM | POA: Diagnosis not present

## 2018-02-07 DIAGNOSIS — H35071 Retinal telangiectasis, right eye: Secondary | ICD-10-CM | POA: Diagnosis not present

## 2018-02-07 DIAGNOSIS — H35072 Retinal telangiectasis, left eye: Secondary | ICD-10-CM | POA: Diagnosis not present

## 2018-02-07 DIAGNOSIS — H43821 Vitreomacular adhesion, right eye: Secondary | ICD-10-CM | POA: Diagnosis not present

## 2018-05-02 IMAGING — DX DG CHEST 1V PORT
1 series · 1 of 1 positions shown · non-contrast
Comparison: Chest radiograph September 23, 2016

CLINICAL DATA: Shortness of breath.  History of breast cancer.

EXAM:
PORTABLE CHEST 1 VIEW

[chest ap]
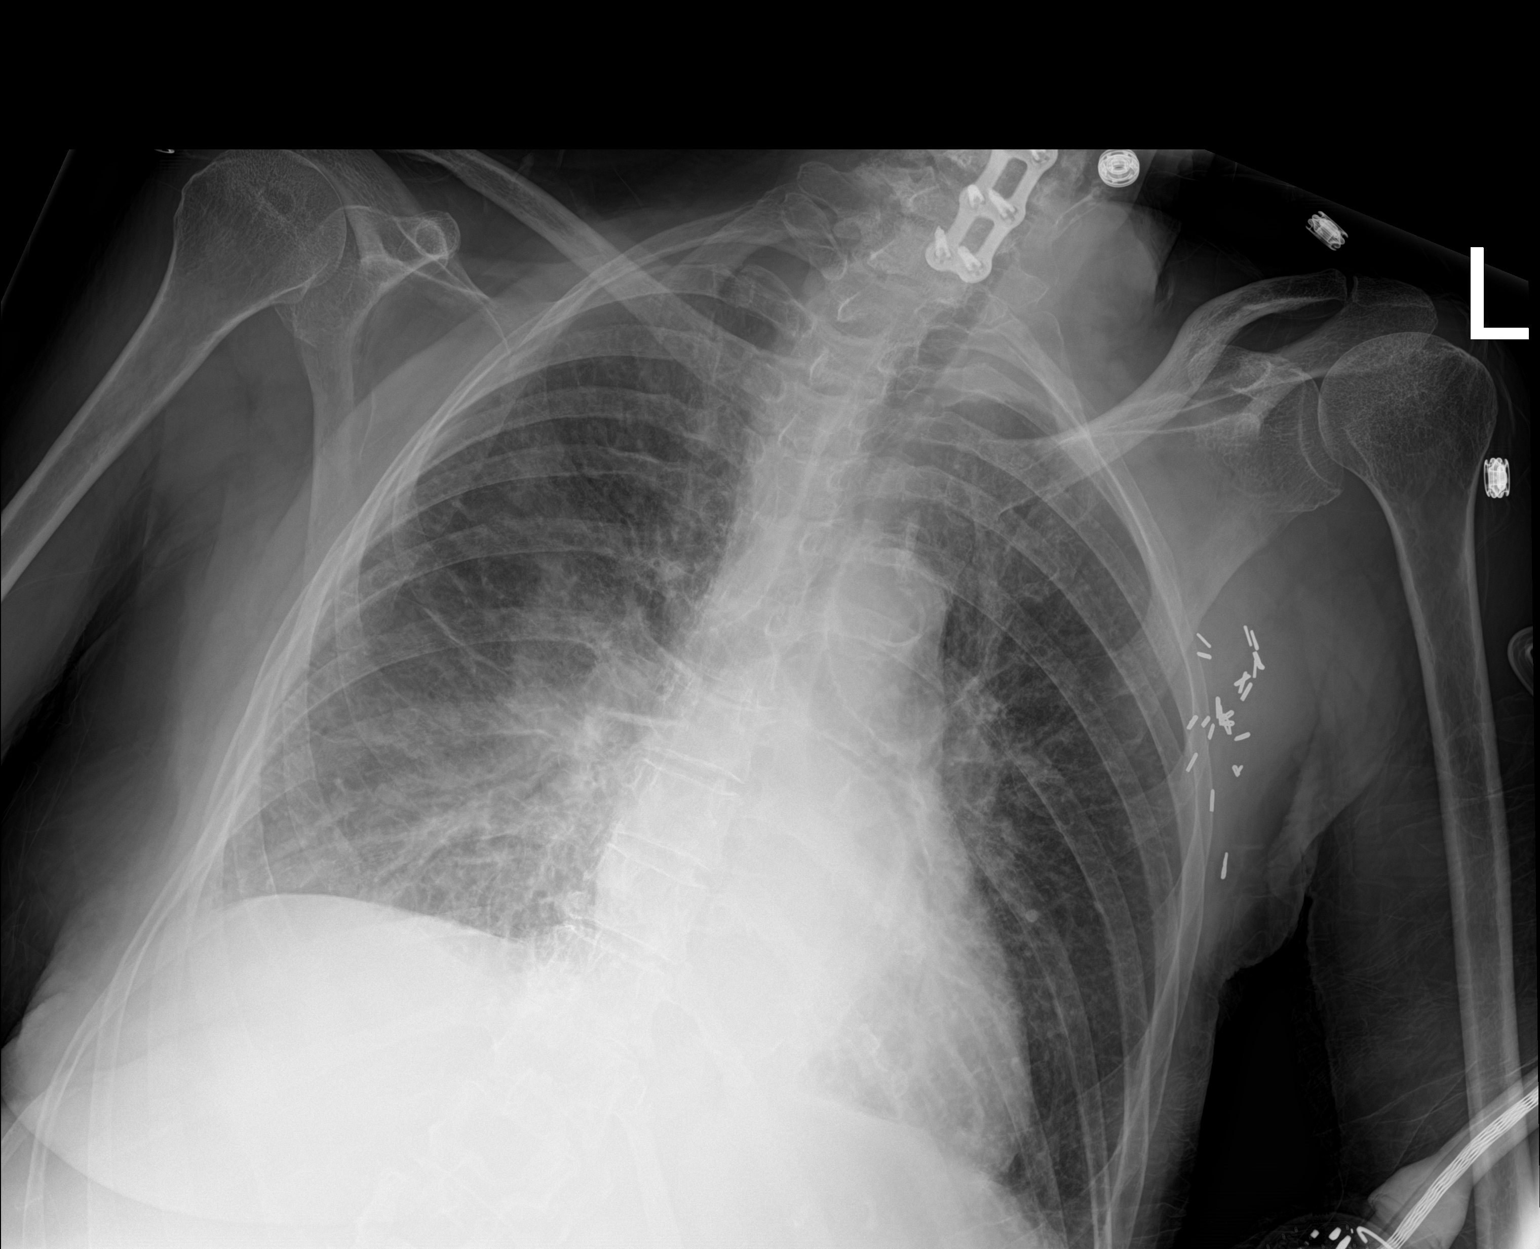

[1 of 1 positions shown; findings below may reference images not displayed]

FINDINGS: Cardiac silhouette is normal. Patient rotated LEFT. Calcified aortic
knob. Diffuse interstitial prominence with patchy perihilar airspace
opacities. Small suspected RIGHT pleural effusion, LEFT costophrenic
angle not completely imaged. RIGHT Kerley B-lines. Apical pleural
thickening. No pneumothorax. ACDF. Surgical clips project in LEFT
axilla, status post LEFT mastectomy.
IMPRESSION: Increasing perihilar airspace opacities with interstitial prominence
and small RIGHT pleural effusion seen with pulmonary edema, less
likely pneumonia.

## 2018-05-04 IMAGING — CR DG CHEST 2V
2 series · 2 of 2 positions shown · non-contrast
Comparison: Portable chest x-ray September 25, 2016

CLINICAL DATA: Dyspnea, follow-up airspace opacities and right
pleural effusion.

EXAM:
CHEST  2 VIEW

[chest lat]
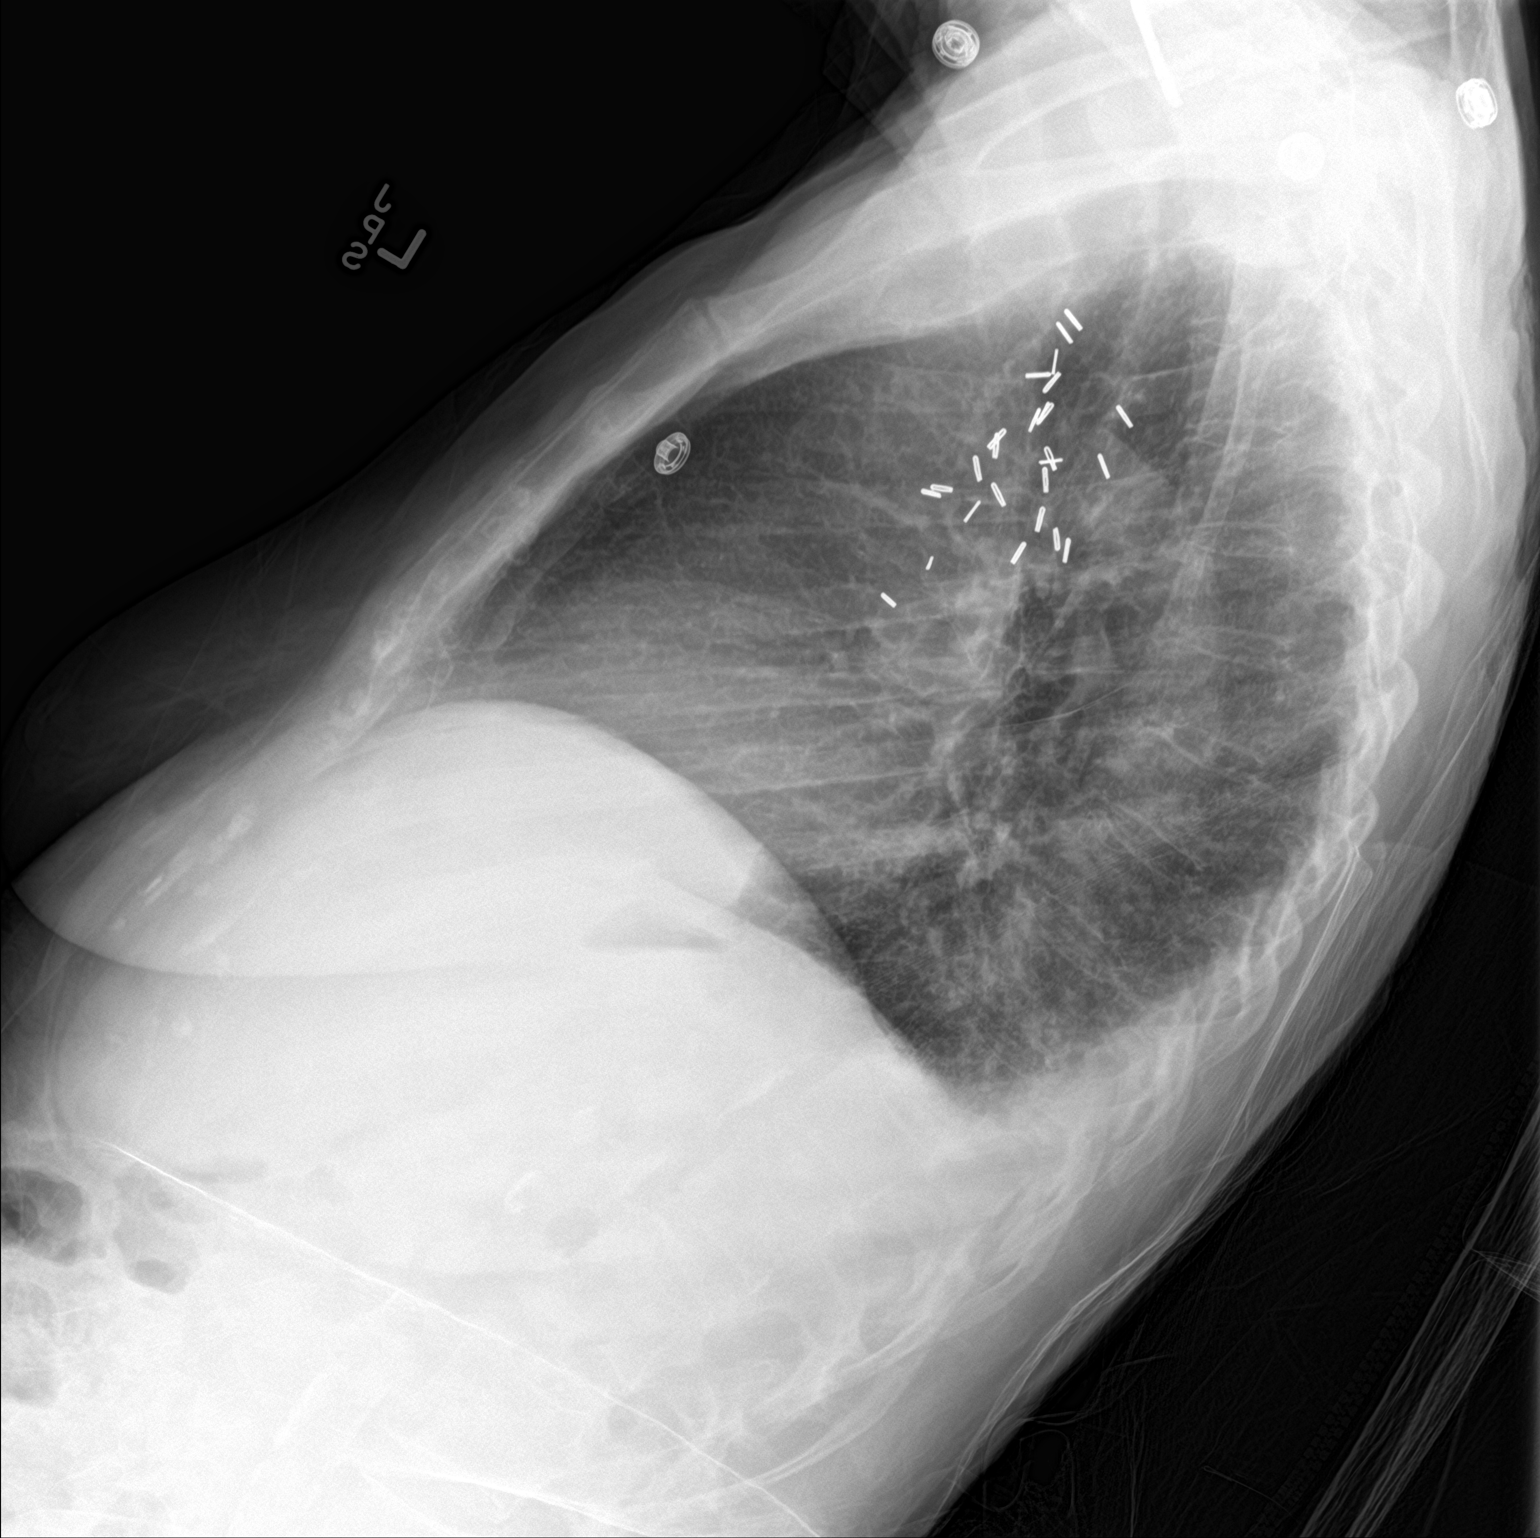

[chest ap]
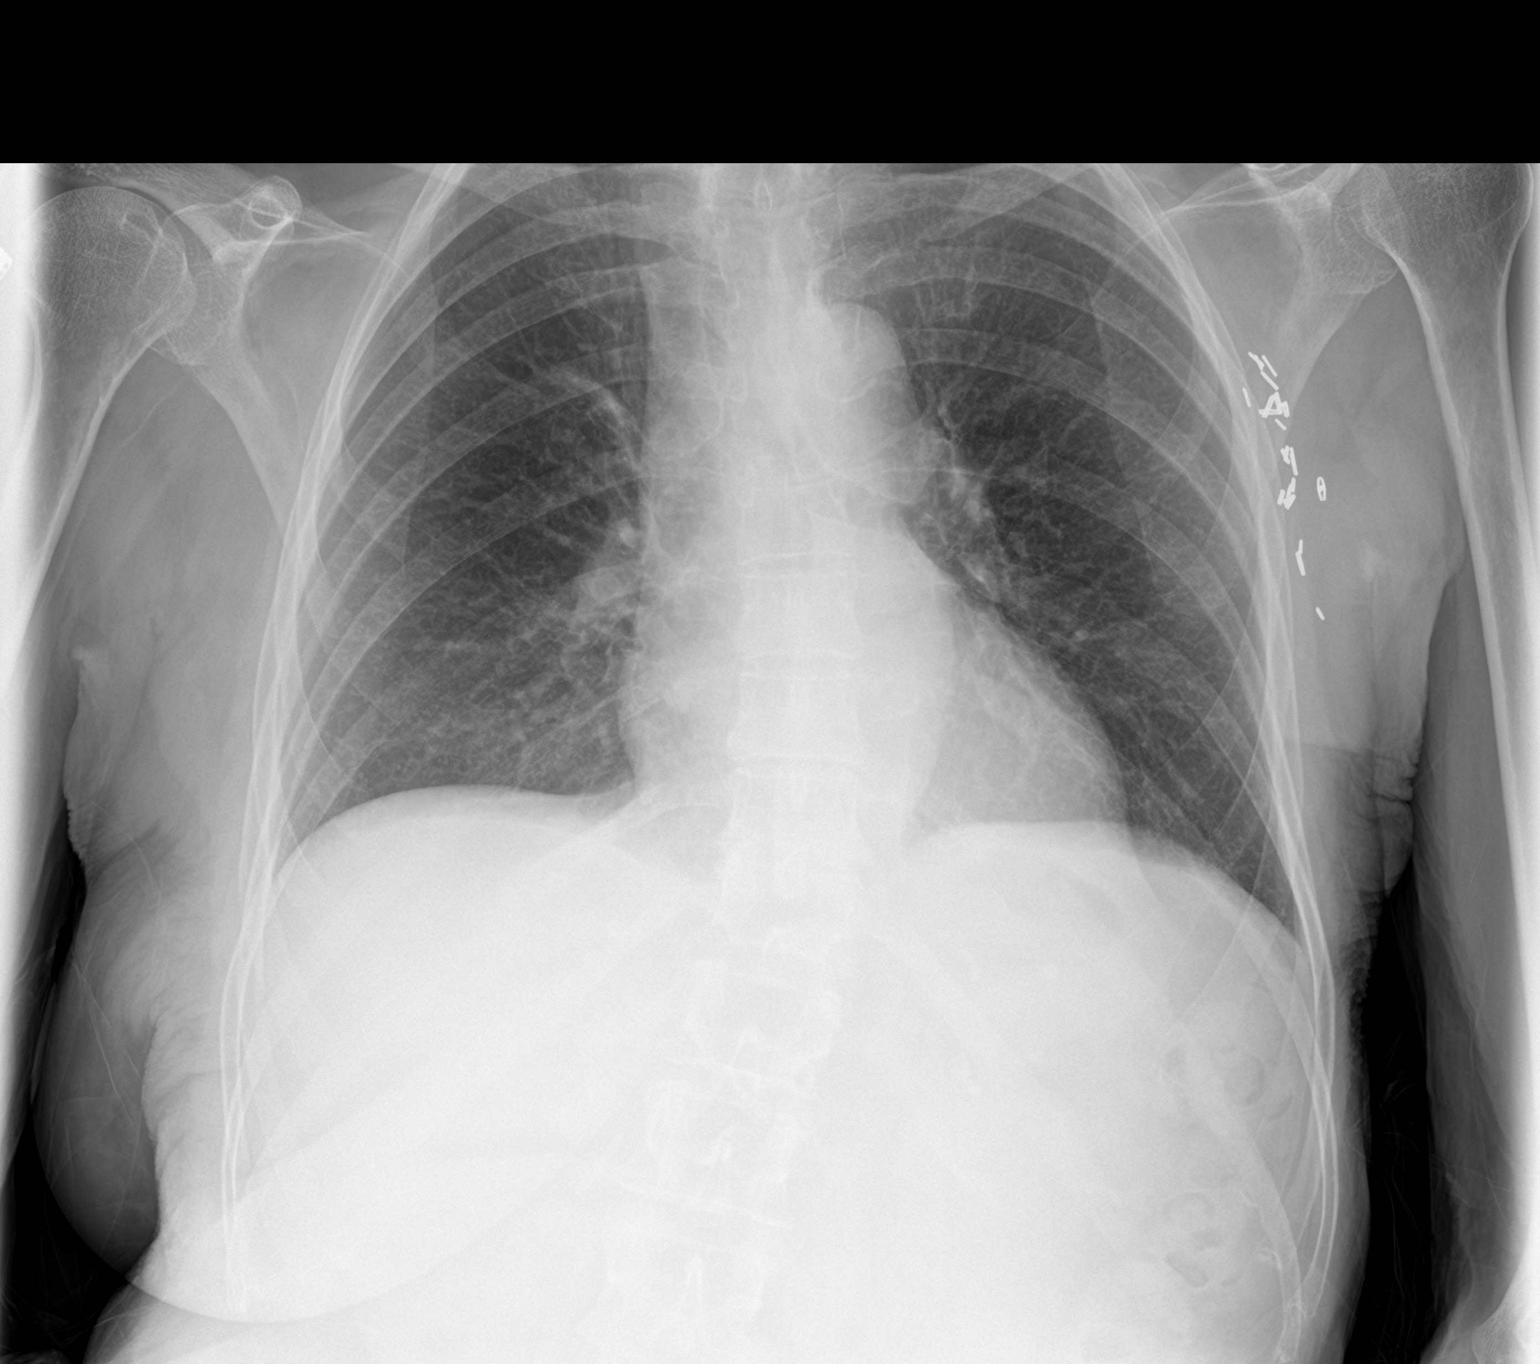

[2 of 2 positions shown; findings below may reference images not displayed]

FINDINGS: The lungs are well-expanded. The interstitial markings have
improved. Patchy alveolar opacities have resolved. Small amounts of
pleural fluid blunt the posterior costophrenic angles. Heart and
pulmonary vascularity are normal. There is calcification in the wall
of the aortic arch. The patient has undergone previous left
mastectomy and left axillary lymph node dissection. The bony thorax
exhibits no acute abnormality.
IMPRESSION: No discrete pneumonia. Improved appearance of the pulmonary
interstitium. Persistent small pleural effusions layering
posteriorly.

Thoracic aortic atherosclerosis.

## 2018-06-27 ENCOUNTER — Emergency Department (HOSPITAL_COMMUNITY): Payer: Medicare Other

## 2018-06-27 ENCOUNTER — Emergency Department (HOSPITAL_COMMUNITY)
Admission: EM | Admit: 2018-06-27 | Discharge: 2018-06-27 | Disposition: A | Discharge status: Home / Self Care | Payer: Medicare Other | Attending: Emergency Medicine | Admitting: Emergency Medicine

## 2018-06-27 ENCOUNTER — Encounter (HOSPITAL_COMMUNITY): Payer: Self-pay

## 2018-06-27 ENCOUNTER — Other Ambulatory Visit: Payer: Self-pay

## 2018-06-27 DIAGNOSIS — N390 Urinary tract infection, site not specified: Secondary | ICD-10-CM | POA: Insufficient documentation

## 2018-06-27 DIAGNOSIS — Z853 Personal history of malignant neoplasm of breast: Secondary | ICD-10-CM | POA: Insufficient documentation

## 2018-06-27 DIAGNOSIS — Z1159 Encounter for screening for other viral diseases: Secondary | ICD-10-CM | POA: Diagnosis not present

## 2018-06-27 DIAGNOSIS — R531 Weakness: Secondary | ICD-10-CM | POA: Diagnosis not present

## 2018-06-27 DIAGNOSIS — Z79899 Other long term (current) drug therapy: Secondary | ICD-10-CM | POA: Diagnosis not present

## 2018-06-27 DIAGNOSIS — I10 Essential (primary) hypertension: Secondary | ICD-10-CM | POA: Insufficient documentation

## 2018-06-27 DIAGNOSIS — E119 Type 2 diabetes mellitus without complications: Secondary | ICD-10-CM | POA: Diagnosis not present

## 2018-06-27 DIAGNOSIS — Z7982 Long term (current) use of aspirin: Secondary | ICD-10-CM | POA: Insufficient documentation

## 2018-06-27 DIAGNOSIS — Z7984 Long term (current) use of oral hypoglycemic drugs: Secondary | ICD-10-CM | POA: Insufficient documentation

## 2018-06-27 LAB — CBC WITH DIFFERENTIAL/PLATELET
Abs Immature Granulocytes: 0.06 10*3/uL (ref 0.00–0.07)
Basophils Absolute: 0.1 10*3/uL (ref 0.0–0.1)
Basophils Relative: 0 %
Eosinophils Absolute: 0 10*3/uL (ref 0.0–0.5)
Eosinophils Relative: 0 %
HCT: 37.2 % (ref 36.0–46.0)
Hemoglobin: 11.6 g/dL — ABNORMAL LOW (ref 12.0–15.0)
Immature Granulocytes: 0 %
Lymphocytes Relative: 31 %
Lymphs Abs: 5.3 10*3/uL — ABNORMAL HIGH (ref 0.7–4.0)
MCH: 28.4 pg (ref 26.0–34.0)
MCHC: 31.2 g/dL (ref 30.0–36.0)
MCV: 91.2 fL (ref 80.0–100.0)
Monocytes Absolute: 1.7 10*3/uL — ABNORMAL HIGH (ref 0.1–1.0)
Monocytes Relative: 10 %
Neutro Abs: 10.2 10*3/uL — ABNORMAL HIGH (ref 1.7–7.7)
Neutrophils Relative %: 59 %
Platelets: 147 10*3/uL — ABNORMAL LOW (ref 150–400)
RBC: 4.08 MIL/uL (ref 3.87–5.11)
RDW: 14.6 % (ref 11.5–15.5)
WBC: 17.2 10*3/uL — ABNORMAL HIGH (ref 4.0–10.5)
nRBC: 0 % (ref 0.0–0.2)

## 2018-06-27 LAB — BASIC METABOLIC PANEL
Anion gap: 14 (ref 5–15)
BUN: 33 mg/dL — ABNORMAL HIGH (ref 8–23)
CO2: 18 mmol/L — ABNORMAL LOW (ref 22–32)
Calcium: 10.4 mg/dL — ABNORMAL HIGH (ref 8.9–10.3)
Chloride: 103 mmol/L (ref 98–111)
Creatinine, Ser: 1.63 mg/dL — ABNORMAL HIGH (ref 0.44–1.00)
GFR calc Af Amer: 33 mL/min — ABNORMAL LOW (ref >60)
GFR calc non Af Amer: 28 mL/min — ABNORMAL LOW (ref >60)
Glucose, Bld: 97 mg/dL (ref 70–99)
Potassium: 4.2 mmol/L (ref 3.5–5.1)
Sodium: 135 mmol/L (ref 135–145)

## 2018-06-27 LAB — URINALYSIS, ROUTINE W REFLEX MICROSCOPIC
Bilirubin Urine: NEGATIVE
Glucose, UA: NEGATIVE mg/dL
Hgb urine dipstick: NEGATIVE
Ketones, ur: NEGATIVE mg/dL
Nitrite: NEGATIVE
Protein, ur: NEGATIVE mg/dL
Specific Gravity, Urine: 1.01 (ref 1.005–1.030)
pH: 5 (ref 5.0–8.0)

## 2018-06-27 LAB — TROPONIN I: Troponin I: <0.03 ng/mL (ref <0.03)

## 2018-06-27 LAB — LACTIC ACID, PLASMA: Lactic Acid, Venous: 1.1 mmol/L (ref 0.5–1.9)

## 2018-06-27 LAB — SARS CORONAVIRUS 2 BY RT PCR (HOSPITAL ORDER, PERFORMED IN ~~LOC~~ HOSPITAL LAB): SARS Coronavirus 2: NEGATIVE

## 2018-06-27 MED ORDER — SODIUM CHLORIDE 0.9 % IV BOLUS
500.0000 mL | Freq: Once | INTRAVENOUS | Status: AC
  Administered 2018-06-27: 16:00:00 500 mL via INTRAVENOUS

## 2018-06-27 MED ORDER — ACETAMINOPHEN 325 MG PO TABS
650.0000 mg | ORAL_TABLET | Freq: Once | ORAL | Status: AC
  Administered 2018-06-27: 650 mg via ORAL
  Filled 2018-06-27: qty 2

## 2018-06-27 MED ORDER — CEPHALEXIN 500 MG PO CAPS: 500.0000 mg | ORAL_CAPSULE | Freq: Three times a day (TID) | ORAL | 0 refills | Status: DC

## 2018-06-27 MED ORDER — SODIUM CHLORIDE 0.9 % IV SOLN
1.0000 g | Freq: Once | INTRAVENOUS | Status: AC
  Administered 2018-06-27: 1 g via INTRAVENOUS
  Filled 2018-06-27: qty 10

## 2018-06-27 NOTE — ED Notes (Signed)
ED Provider at bedside. 

## 2018-06-27 NOTE — ED Notes (Signed)
Nurse Navigator communication: RN currently at bedside, pts labs are being processed. I will follow back up once results are available.

## 2018-06-27 NOTE — ED Notes (Signed)
Daughter notified of discharge, otw for pickup.

## 2018-06-27 NOTE — ED Provider Notes (Signed)
Patient with stable vital signs.  Appears to have urinary tract infection.  Urine sent for culture.  Given dose of IV Rocephin.  Will just send home with prescription for Keflex.  Daughter has been updated and will pick patient up.   Julianne Rice, MD 06/27/18 2034

## 2018-06-27 NOTE — ED Notes (Signed)
All appropriate discharge materials reviewed at length with patient. Time for questions provided. Pt has no other questions at this time and verbalizes understanding of all provided materials.  

## 2018-06-27 NOTE — ED Provider Notes (Signed)
[facility] EMERGENCY DEPARTMENT Provider Note   CSN: [id number] Arrival date & time: 06/27/18  1222    History   Chief Complaint Chief Complaint  Patient presents with  . Weakness    HPI Deanna [name] is a 85 y.o. female.   HPI   88yF with generalized weakness. Onset about 4d ago. Persistent since then. Had outpt labs that were significant for WBC of 18 and advised to come to ED. No reported fever. She denies any acute pain. On cough. No dyspnea. No urinary complaints. Appetite has been decreased. No sick contacts that she is aware of.   Past Medical History:  Diagnosis Date  . Arthritis    all over  . Cancer ([place])    breast  . Depression   . Diabetes mellitus without complication (HCC)    diet controlled  . Hypertension     Patient Active Problem List   Diagnosis Date Noted  . Loss of appetite 09/23/2016  . AKI (acute kidney injury) ([place]) 09/23/2016  . Orthostasis 09/23/2016  . Hyponatremia 09/23/2016  . Leukocytosis 09/23/2016  . Normocytic anemia 09/23/2016  . Depression with anxiety 09/23/2016  . Diabetes mellitus type II, non insulin dependent ([place]) 09/23/2016  . Transaminasemia 09/23/2016  . Dehydration   . Elevated LFTs   . Chronic midline low back pain without sciatica 01/13/2016    Past Surgical History:  Procedure Laterality Date  . APPENDECTOMY     with lower back surgery  . BACK SURGERY     x2  . BREAST SURGERY Left 1995   complete left mastectomy  . CERVICAL SPINE SURGERY    . EYE SURGERY Bilateral    cataracts  . TONSILLECTOMY     at age 23     OB History   No obstetric history on file.      Home Medications    Prior to Admission medications   Medication Sig Start Date End Date Taking? Authorizing Provider  Acetaminophen (TYLENOL ARTHRITIS PAIN PO) Take 650 mg by mouth at bedtime.     [provider]  ALPRAZolam Duanne Moron) 0.25 MG tablet Take 0.25 mg by mouth 2 (two) times daily as needed for  anxiety.    [provider]  amLODipine-valsartan (EXFORGE) 5-160 MG tablet Take 1 tablet by mouth daily.    [provider]  aspirin EC 81 MG tablet Take 81 mg by mouth daily.    [provider]  buPROPion (WELLBUTRIN XL) 150 MG 24 hr tablet Take 150 mg by mouth daily.  10/27/15   [provider]  carvedilol (COREG) 12.5 MG tablet Take 12.5 mg by mouth 2 (two) times daily with a meal.  01/07/16   [provider]  Cyanocobalamin (VITAMIN B 12 PO) Take 1,000 mcg by mouth daily.    [provider]  cyclobenzaprine (FLEXERIL) 5 MG tablet Take 5 mg by mouth every 8 (eight) hours as needed for muscle spasms.    [provider]  cycloSPORINE (RESTASIS) 0.05 % ophthalmic emulsion Place 1 drop into both eyes See admin instructions. Per patient she uses 6 times at day. Her daughter will verify    [provider]  denosumab (PROLIA) 60 MG/ML SOLN injection Inject 60 mg into the skin every 6 (six) months. Administer in upper arm, thigh, or abdomen    [provider]  DULoxetine HCl (CYMBALTA PO) Take 60 mg by mouth daily.    [provider]  esomeprazole (NEXIUM) 40 MG capsule Take  40 mg by mouth daily at 12 noon.    [provider]  feeding supplement, ENSURE ENLIVE, (ENSURE ENLIVE) LIQD Take 237 mLs by mouth 3 (three) times daily between meals. 09/28/16   Arrien, Jimmy Picket, MD  Hyoscyamine Sulfate 0.375 MG TBCR Take 1 tablet by mouth every 12 (twelve) hours as needed.    [provider]  LYRICA 75 MG capsule Take 75 mg by mouth 2 (two) times daily.  11/03/15   [provider]  metFORMIN (GLUCOPHAGE) 500 MG tablet Take 500 mg by mouth 2 (two) times daily. 06/29/16   [provider]  MYRBETRIQ 25 MG TB24 tablet Take 25 mg by mouth daily.  10/21/15   [provider]  rosuvastatin (CRESTOR) 10 MG tablet Take 10 mg by mouth at bedtime.     [provider]  traMADol  (ULTRAM) 50 MG tablet Take 50 mg by mouth every 8 (eight) hours as needed.    [provider]  triamcinolone cream (KENALOG) 0.5 % Apply 1 application topically 2 (two) times daily. Put on her feet    [provider]  Vitamin D, Cholecalciferol, 400 units TABS Take 400 Units by mouth 2 (two) times daily.     [provider]  vitamin E 400 UNIT capsule Take 800 Units by mouth daily.    [provider]    Family History No family history on file.  Social History Social History   Tobacco Use  . Smoking status: Never Smoker  . Smokeless tobacco: Never Used  Substance Use Topics  . Alcohol use: Never    Frequency: Never  . Drug use: Never     Allergies   Codeine   Review of Systems Review of Systems  All systems reviewed and negative, other than as noted in HPI.  Physical Exam Updated Vital Signs BP (!) 127/52 (BP Location: Right Arm)   Pulse 63   Temp 98.9 F (37.2 C) (Oral)   Resp 17   Ht 5\' 2"  (1.575 m)   Wt 49.9 kg   SpO2 98%   BMI 20.12 kg/m   Physical Exam Vitals signs and nursing note reviewed.  Constitutional:      General: She is not in acute distress.    Appearance: She is well-developed.  HENT:     Head: Normocephalic and atraumatic.  Eyes:     General:        Right eye: No discharge.        Left eye: No discharge.     Conjunctiva/sclera: Conjunctivae normal.  Neck:     Musculoskeletal: Neck supple.  Cardiovascular:     Rate and Rhythm: Normal rate and regular rhythm.     Heart sounds: Normal heart sounds. No murmur. No friction rub. No gallop.   Pulmonary:     Effort: Pulmonary effort is normal. No respiratory distress.     Breath sounds: Normal breath sounds.  Abdominal:     General: There is no distension.     Palpations: Abdomen is soft.     Tenderness: There is no abdominal tenderness.  Musculoskeletal:        General: No tenderness.  Skin:    General: Skin is warm and dry.  Neurological:      Mental Status: She is alert.  Psychiatric:        Behavior: Behavior normal.        Thought Content: Thought content normal.      ED Treatments / Results  Labs (  all labs ordered are listed, but only abnormal results are displayed) Labs Reviewed  CBC WITH DIFFERENTIAL/PLATELET - Abnormal; Notable for the following components:      Result Value   WBC 17.2 (*)    Hemoglobin 11.6 (*)    Platelets 147 (*)    Neutro Abs 10.2 (*)    Lymphs Abs 5.3 (*)    Monocytes Absolute 1.7 (*)    All other components within normal limits  BASIC METABOLIC PANEL - Abnormal; Notable for the following components:   CO2 18 (*)    BUN 33 (*)    Creatinine, Ser 1.63 (*)    Calcium 10.4 (*)    GFR calc non Af Amer 28 (*)    GFR calc Af Amer 33 (*)    All other components within normal limits  SARS CORONAVIRUS 2 (HOSPITAL ORDER, [facility])  TROPONIN I  LACTIC ACID, PLASMA  URINALYSIS, ROUTINE W REFLEX MICROSCOPIC  LACTIC ACID, PLASMA    EKG None  Radiology Dg Chest Portable 1 View  Result Date: 06/27/2018 CLINICAL DATA:  Fatigue EXAM: PORTABLE CHEST 1 VIEW COMPARISON:  09/27/2016 FINDINGS: The cardiac silhouette is mildly enlarged. There are surgical clips in the left axilla. There is no pneumonia. No pleural effusion. There is no acute osseous abnormality. The patient is status post remote ACDF of the lower cervical spine. Pleuroparenchymal scarring is noted at the lung apices bilaterally. IMPRESSION: No active disease. Electronically Signed   By: Constance Holster M.D.   On: 06/27/2018 14:26    Procedures Procedures (including critical care time)  Medications Ordered in ED Medications - No data to display   Initial Impression / Assessment and Plan / ED Course  I have reviewed the triage vital signs and the nursing notes.  Pertinent labs & imaging results that were available during my care of the patient were reviewed by me and considered in my medical decision  making (see chart for details).        88yF with generalized weakness. Leukocytosis of unclear significance. She is afebrile. Really no other specific complaints.  Not significantly anemic. Nonfocal neuro exam. Renal function is close to baseline. May potentially have UTI. Care signed out to oncoming provider.  Final Clinical Impressions(s) / ED Diagnoses   Final diagnoses:  Generalized weakness  Lower urinary tract infection    ED Discharge Orders    None       Virgel Manifold, MD 06/28/18 [phone]

## 2018-06-27 NOTE — ED Triage Notes (Signed)
Pt arrives POV from MD office with c/o feeling weak sincwe Saturday. Seen at MD office and sent with ab normal labs. WBC 18.4 Pt alert and oriented x 4

## 2018-06-27 NOTE — ED Notes (Addendum)
Nurse Navigator communication: Romie Minus, [employer]ud[employer] of nursing calling with concerns for covid test. EDP permitted information to be released.   Daughter San Morelle has been provided an update on patient status. She had some information regarding pt plan of care. Communication with the EDP and primary RN has been done. She would like to be informed if the patient is going to be admitted or discharged as she would need to take her back to the facility.

## 2018-06-29 LAB — URINE CULTURE: Culture: >=100000 — AB

## 2018-06-30 ENCOUNTER — Telehealth: Payer: Self-pay | Admitting: *Deleted

## 2018-06-30 NOTE — Telephone Encounter (Signed)
Post ED Visit - Positive Culture Follow-up  Culture report reviewed by antimicrobial stewardship pharmacist: [facility] Team []  Elenor Quinones, Pharm.D. []  Heide Guile, Pharm.D., BCPS AQ-ID [x]  Parks Neptune, Pharm.D., BCPS []  Alycia Rossetti, Pharm.D., BCPS []  [place], Pharm.D., BCPS, AAHIVP []  Legrand Como, Pharm.D., BCPS, AAHIVP []  Salome Arnt, PharmD, BCPS []  Johnnette Gourd, PharmD, BCPS []  Hughes Better, PharmD, BCPS []  Leeroy Cha, PharmD []  Laqueta Linden, PharmD, BCPS []  Albertina Parr, PharmD  [facility] Team []  Leodis Sias, PharmD []  Lindell Spar, PharmD []  Royetta Asal, PharmD []  Graylin Shiver, Rph []  Rema Fendt) Glennon Mac, PharmD []  Arlyn Dunning, PharmD []  Netta Cedars, PharmD []  Dia Sitter, PharmD []  Leone Haven, PharmD []  Gretta Arab, PharmD []  Theodis Shove, PharmD []  Peggyann Juba, PharmD []  Reuel Boom, PharmD   Positive urine culture Treated with Cephalexin, organism sensitive to the same and no further patient follow-up is required at this time.  Harlon Flor [facility] 06/30/2018, 12:16 PM

## 2018-07-24 ENCOUNTER — Other Ambulatory Visit (HOSPITAL_COMMUNITY): Payer: Self-pay | Admitting: *Deleted

## 2018-07-25 ENCOUNTER — Other Ambulatory Visit: Payer: Self-pay

## 2018-07-25 ENCOUNTER — Ambulatory Visit (HOSPITAL_COMMUNITY)
Admission: RE | Admit: 2018-07-25 | Discharge: 2018-07-25 | Disposition: A | Discharge status: Home / Self Care | Payer: Medicare Other | Source: Ambulatory Visit | Attending: Internal Medicine | Admitting: Internal Medicine

## 2018-07-25 DIAGNOSIS — M810 Age-related osteoporosis without current pathological fracture: Secondary | ICD-10-CM | POA: Diagnosis present

## 2018-07-25 MED ORDER — DENOSUMAB 60 MG/ML ~~LOC~~ SOSY
60.0000 mg | PREFILLED_SYRINGE | Freq: Once | SUBCUTANEOUS | Status: AC
  Administered 2018-07-25: 60 mg via SUBCUTANEOUS

## 2018-07-25 MED ORDER — DENOSUMAB 60 MG/ML ~~LOC~~ SOSY
PREFILLED_SYRINGE | SUBCUTANEOUS | Status: AC
  Administered 2018-07-25: 60 mg via SUBCUTANEOUS
  Filled 2018-07-25: qty 1

## 2018-09-13 ENCOUNTER — Encounter: Payer: Self-pay | Admitting: Orthopaedic Surgery

## 2018-09-13 ENCOUNTER — Ambulatory Visit: Payer: Medicare Other

## 2018-09-13 ENCOUNTER — Ambulatory Visit (INDEPENDENT_AMBULATORY_CARE_PROVIDER_SITE_OTHER): Payer: Medicare Other | Admitting: Orthopaedic Surgery

## 2018-09-13 VITALS — Ht 61.0 in | Wt 110.0 lb

## 2018-09-13 DIAGNOSIS — M542 Cervicalgia: Secondary | ICD-10-CM | POA: Diagnosis not present

## 2018-09-13 NOTE — Progress Notes (Signed)
Office Visit Note   Patient: Deanna [name]           Date of Birth: 1930/09/21           MRN: [id number] Visit Date: 09/13/2018              Requested by: Marton Redwood, MD [address] [place],  [place] [address] PCP: Marton Redwood, MD   Assessment & Plan: Visit Diagnoses:  1. Neck pain     Plan: Patient 3 level fusion C4-C7 is solid.  She has spondylosis above and below and also anterolisthesis at C2-3 she gets relief with distraction of cervical spine we will set her up for some home cervical traction and once COVID improved she can resume her physical therapy once restrictions are lifted.  At present her symptoms are not severe enough to proceed with MRI imaging or possibility of cervical discussion.  She gets worse she will let us know.  X-ray results were reviewed and previous images reviewed and discussed.  Follow-Up Instructions: No follow-ups on file.   Orders:  Orders Placed This Encounter  Procedures  . XR Cervical Spine 2 or 3 views   No orders of the defined types were placed in this encounter.     Procedures: No procedures performed   Clinical Data: No additional findings.   Subjective: Chief Complaint  Patient presents with  . Neck - Pain    HPI 85 year old female had to stop physical therapy due to [name] and was doing this at [facility].  Patient's pain is worse in her neck since she stopped therapy bothers her some on the left side of the neck previous C4-C7 3 level cervical fusion with plating.  She has an adjacent spondylosis above and below her past surgery history.  She does have a history of neuropathy she is used Tylenol and heat with some improvement.  Previous surgery was done by Dr. Glenna [name] a many years ago cervical spine.  Patient notes no pain on the right side with turning her neck.  She denies fever chills no associated gait problems related to her neck.  Review of Systems 14 point review of systems updated unchanged from  09/09/2017 with previous L5-S1 microdiscectomy 2006.  Type 2 diabetes.  History of anemia, depression death of her husband, anxiety appetite loss and decreased BMI.   Objective: Vital Signs: Ht 5\' 1"  (1.549 m)   Wt 110 lb (49.9 kg)   BMI 20.78 kg/m   Physical Exam Constitutional:      Appearance: She is well-developed.  HENT:     Head: Normocephalic.     Right Ear: External ear normal.     Left Ear: External ear normal.  Eyes:     Pupils: Pupils are equal, round, and reactive to light.  Neck:     Thyroid: No thyromegaly.     Trachea: No tracheal deviation.  Cardiovascular:     Rate and Rhythm: Normal rate.  Pulmonary:     Effort: Pulmonary effort is normal.  Abdominal:     Palpations: Abdomen is soft.  Skin:    General: Skin is warm and dry.  Neurological:     Mental Status: She is alert and oriented to person, place, and time.  Psychiatric:        Behavior: Behavior normal.     Ortho Exam patient has some brachial plexus tenderness more on the left than the right with rotation left 50%.  She gets some relief with distraction some  increased pain with compression upper extremity reflexes are 2+ and symmetrical no lower extremity clonus or hyperreflexia.  Specialty Comments:  No specialty comments available.  Imaging: No results found.   PMFS History: Patient Active Problem List   Diagnosis Date Noted  . Loss of appetite 09/23/2016  . AKI (acute kidney injury) ([place]) 09/23/2016  . Orthostasis 09/23/2016  . Hyponatremia 09/23/2016  . Leukocytosis 09/23/2016  . Normocytic anemia 09/23/2016  . Depression with anxiety 09/23/2016  . Diabetes mellitus type II, non insulin dependent ([place]) 09/23/2016  . Transaminasemia 09/23/2016  . Dehydration   . Elevated LFTs   . Chronic midline low back pain without sciatica 01/13/2016   Past Medical History:  Diagnosis Date  . Arthritis    all over  . Cancer ([place])    breast  . Depression   . Diabetes mellitus without  complication (HCC)    diet controlled  . Hypertension     No family history on file.  Past Surgical History:  Procedure Laterality Date  . APPENDECTOMY     with lower back surgery  . BACK SURGERY     x2  . BREAST SURGERY Left 1995   complete left mastectomy  . CERVICAL SPINE SURGERY    . EYE SURGERY Bilateral    cataracts  . TONSILLECTOMY     at age 39   Social History   Occupational History  . Not on file  Tobacco Use  . Smoking status: Never Smoker  . Smokeless tobacco: Never Used  Substance and Sexual Activity  . Alcohol use: Never    Frequency: Never  . Drug use: Never  . Sexual activity: Not on file

## 2018-09-18 ENCOUNTER — Telehealth: Payer: Self-pay | Admitting: Orthopaedic Surgery

## 2018-09-18 NOTE — Telephone Encounter (Signed)
Patient's daughter San Morelle called advised patient was given something to stretch her neck and she lives alone and can not do that by herself. The number to contact San Morelle is [phone]

## 2018-09-18 NOTE — Telephone Encounter (Signed)
I called patient and I also called daughter San Morelle and discussed.  Patient's other daughter is coming in this afternoon will stay with her for couple days and I will call patient's number tomorrow and discussed with the other daughter appropriate use.  She needs to hook the traction up over her neck before she puts the water bag on the rope and needs to be sitting in a chair so she is not at risk for falling.  Will discuss with them tomorrow the best way to set the traction up.

## 2018-09-18 NOTE — Telephone Encounter (Signed)
pls advise. Thanks.  

## 2018-09-19 NOTE — Telephone Encounter (Signed)
Fax # [phone]    Daughter want Rx sent to this # for OT [facility] visit , she is at a facility where they have OT and they can come to her appt.  Fax # listed.   Use of traction Etc., eval and treat.    Needs to be OT. thanks

## 2018-09-19 NOTE — Telephone Encounter (Signed)
faxed

## 2019-02-09 DIAGNOSIS — I499 Cardiac arrhythmia, unspecified: Secondary | ICD-10-CM

## 2019-02-09 HISTORY — DX: Cardiac arrhythmia, unspecified: I49.9

## 2019-02-20 ENCOUNTER — Ambulatory Visit (INDEPENDENT_AMBULATORY_CARE_PROVIDER_SITE_OTHER): Payer: Medicare Other | Admitting: Orthopaedic Surgery

## 2019-02-20 ENCOUNTER — Ambulatory Visit (INDEPENDENT_AMBULATORY_CARE_PROVIDER_SITE_OTHER): Payer: Medicare Other

## 2019-02-20 ENCOUNTER — Encounter: Payer: Self-pay | Admitting: Orthopaedic Surgery

## 2019-02-20 ENCOUNTER — Other Ambulatory Visit: Payer: Self-pay

## 2019-02-20 VITALS — Ht 60.0 in | Wt 110.0 lb

## 2019-02-20 DIAGNOSIS — M545 Low back pain, unspecified: Secondary | ICD-10-CM

## 2019-02-20 DIAGNOSIS — M25551 Pain in right hip: Secondary | ICD-10-CM

## 2019-02-20 MED ORDER — PREDNISONE 5 MG (21) PO TBPK: ORAL_TABLET | ORAL | 0 refills | Status: DC

## 2019-02-20 NOTE — Progress Notes (Signed)
Office Visit Note   Patient: Deanna [name]           Date of Birth: 03/26/1930           MRN: [id number] Visit Date: 02/20/2019              Requested by: Marton Redwood, MD [address] [place],  [place] [address] PCP: Marton Redwood, MD   Assessment & Plan: Visit Diagnoses:  1. Acute right-sided low back pain, unspecified whether sciatica present   2. Pain in right hip     Plan: Patient has degenerative anterolisthesis L4-5 and L5-S1.  Likely has disc bulge with associated sciatica on the right side.  We will put her on the 5 mg prednisone taper she will check her sugars carefully for sugars shoot up she will stop the prednisone.  I will check her back again in 4 weeks if she is having persistent problems will need to consider repeat  MRI imaging of lumbar spine.  Follow-Up Instructions: Return in about 4 weeks (around 03/20/2019).   Orders:  Orders Placed This Encounter  Procedures  . XR Lumbar Spine 2-3 Views  . XR Pelvis 1-2 Views   Meds ordered this encounter  Medications  . predniSONE (STERAPRED UNI-PAK 21 TAB) 5 MG (21) TBPK tablet    Sig: Take as instructed with food. 6,5,4,3,2,1 stop    Dispense:  21 tablet    Refill:  0      Procedures: No procedures performed   Clinical Data: No additional findings.   Subjective: Chief Complaint  Patient presents with  . Lower Back - Pain  . Right Leg - Pain    HPI 86 year old female returns with 1 month history of low back pain with right sciatic notch pain that radiates to the lateral hip.  At 1 point it was going down her leg does not seem to be doing that as much now.  She originally thought it might be a UTI.  She got a specimen obtained took some antibiotics.  She is used heat without relief.  She has a cane at home but did not bring it with her today.  She does have type 2 diabetes with some neuropathy.  Sugars have been 105 to 110.  Patient is on some Prolia injections.  Also using some Ensure supplements to  help with weight loss.  Glucophage twice daily as well as calcium and vitamin D.  Review of Systems Use systems updated previous multilevel cervical fusion with some adjacent level stenosis above her fusion.  Hyponatremia elevated liver enzymes, diabetes medication controlled.  Previous lumbar disc protrusion right L5-S1.  Otherwise negative is a pertains HPI.  14 point systems updated.  Objective: Vital Signs: Ht 5' (1.524 m)   Wt 110 lb (49.9 kg)   BMI 21.48 kg/m   Physical Exam Constitutional:      Appearance: She is well-developed.  HENT:     Head: Normocephalic.     Right Ear: External ear normal.     Left Ear: External ear normal.  Eyes:     Pupils: Pupils are equal, round, and reactive to light.  Neck:     Thyroid: No thyromegaly.     Trachea: No tracheal deviation.  Cardiovascular:     Rate and Rhythm: Normal rate.  Pulmonary:     Effort: Pulmonary effort is normal.  Abdominal:     Palpations: Abdomen is soft.  Skin:    General: Skin is warm and dry.  Neurological:  Mental Status: She is alert and oriented to person, place, and time.  Psychiatric:        Behavior: Behavior normal.     Ortho Exam Patient has pain with turning her neck to the left upper paracervical.  Sensation the hands are intact.  She has exquisite sciatic notch tenderness on the right side negative on her left minimal trochanteric bursal tenderness negative straight leg raising 90 degrees.  Reflexes are intact anterior tib gastrocsoleus is intact. Specialty Comments:  No specialty comments available.  Imaging: XR Lumbar Spine 2-3 Views  Result Date: 02/20/2019 AP lateral lumbar x-rays obtained and reviewed.  This shows mild lumbar curvature unchanged from 2017 images.  Comparison to lumbar MRI 2017 shows grade 1 anterolisthesis at L4-5 and L5-S1 with previous laminotomy right L5.  Upper lumbar levels appear normal. Impression: Degenerative anterolisthesis grade 1-1.5 at L5-S1 and also  L4-5.  XR Pelvis 1-2 Views  Result Date: 02/20/2019 AP pelvis to include hip joints demonstrates normal pelvis and hip anatomy.  No degenerative changes seen in the hip.  X-rays suggest osteopenia. Impression: Pelvis and hip x-rays negative for acute changes.    PMFS History: Patient Active Problem List   Diagnosis Date Noted  . Loss of appetite 09/23/2016  . AKI (acute kidney injury) ([facility] [place]) 09/23/2016  . Orthostasis 09/23/2016  . Hyponatremia 09/23/2016  . Leukocytosis 09/23/2016  . Normocytic anemia 09/23/2016  . Depression with anxiety 09/23/2016  . Diabetes mellitus type II, non insulin dependent ([place]) 09/23/2016  . Transaminasemia 09/23/2016  . Dehydration   . Elevated LFTs   . Chronic midline low back pain without sciatica 01/13/2016   Past Medical History:  Diagnosis Date  . Arthritis    all over  . Cancer ([facility])    breast  . Depression   . Diabetes mellitus without complication (HCC)    diet controlled  . Hypertension     No family history on file.  Past Surgical History:  Procedure Laterality Date  . APPENDECTOMY     with lower back surgery  . BACK SURGERY     x2  . BREAST SURGERY Left 1995   complete left mastectomy  . CERVICAL SPINE SURGERY    . EYE SURGERY Bilateral    cataracts  . TONSILLECTOMY     at age 35   Social History   Occupational History  . Not on file  Tobacco Use  . Smoking status: Never Smoker  . Smokeless tobacco: Never Used  Substance and Sexual Activity  . Alcohol use: Never  . Drug use: Never  . Sexual activity: Not on file

## 2019-02-27 ENCOUNTER — Ambulatory Visit: Payer: Medicare Other | Admitting: Orthopaedic Surgery

## 2019-03-20 ENCOUNTER — Ambulatory Visit: Payer: Medicare Other | Admitting: Orthopaedic Surgery

## 2019-07-26 ENCOUNTER — Ambulatory Visit (INDEPENDENT_AMBULATORY_CARE_PROVIDER_SITE_OTHER): Payer: Medicare Other | Admitting: Ophthalmology

## 2019-07-26 ENCOUNTER — Other Ambulatory Visit: Payer: Self-pay

## 2019-07-26 ENCOUNTER — Encounter (INDEPENDENT_AMBULATORY_CARE_PROVIDER_SITE_OTHER): Payer: Self-pay | Admitting: Ophthalmology

## 2019-07-26 DIAGNOSIS — H35072 Retinal telangiectasis, left eye: Secondary | ICD-10-CM | POA: Insufficient documentation

## 2019-07-26 DIAGNOSIS — H35071 Retinal telangiectasis, right eye: Secondary | ICD-10-CM | POA: Insufficient documentation

## 2019-07-26 DIAGNOSIS — H43821 Vitreomacular adhesion, right eye: Secondary | ICD-10-CM | POA: Insufficient documentation

## 2019-07-26 DIAGNOSIS — H3562 Retinal hemorrhage, left eye: Secondary | ICD-10-CM

## 2019-07-26 DIAGNOSIS — E119 Type 2 diabetes mellitus without complications: Secondary | ICD-10-CM

## 2019-07-26 DIAGNOSIS — H43822 Vitreomacular adhesion, left eye: Secondary | ICD-10-CM

## 2019-07-26 NOTE — Assessment & Plan Note (Signed)
No peripheral diabetic retinopathy detected

## 2019-07-26 NOTE — Progress Notes (Signed)
07/26/2019     CHIEF COMPLAINT Patient presents for Retina Follow Up   HISTORY OF PRESENT ILLNESS: Deanna [name] is a 86 y.o. female who presents to the clinic today for:   HPI    Retina Follow Up    Diagnosis: Mac Tel.  In both eyes.  Severity is moderate.  Duration of 6 weeks.  Since onset it is stable.  I, the attending physician,  performed the HPI with the patient and updated documentation appropriately.          Comments    6 Month Mac Tel f\u OU. OCT  Pt states she has trouble reading. Pt denies floaters but sees occasional FOL. BGL: 117 yesterday       Last edited by Tilda Franco on 07/26/2019  2:29 PM. (History)      Referring physician: Marton Redwood, MD [address] [place],  [place] [address]  HISTORICAL INFORMATION:   Selected notes from the [facility]    Lab Results  Component Value Date   HGBA1C 7.4 (H) 09/23/2016     CURRENT MEDICATIONS: No current outpatient medications on file. (Ophthalmic Drugs)   No current facility-administered medications for this visit. (Ophthalmic Drugs)   Current Outpatient Medications (Other)  Medication Sig  . acetaminophen (TYLENOL) 500 MG tablet Take 500 mg by mouth 2 (two) times a day.  . ALPRAZolam (XANAX) 0.25 MG tablet Take 0.25 mg by mouth 2 (two) times daily as needed for anxiety.  Marland Kitchen aspirin EC 81 MG tablet Take 81 mg by mouth at bedtime.   . carvedilol (COREG) 12.5 MG tablet Take 12.5 mg by mouth 2 (two) times daily with a meal.   . cephALEXin (KEFLEX) 500 MG capsule Take 1 capsule (500 mg total) by mouth 3 (three) times daily. (Patient not taking: Reported on 09/13/2018)  . Cyanocobalamin (VITAMIN B 12 PO) Take 1,000 mcg by mouth daily.  . cyclobenzaprine (FLEXERIL) 5 MG tablet Take 5 mg by mouth every 8 (eight) hours as needed for muscle spasms.  Marland Kitchen denosumab (PROLIA) 60 MG/ML SOLN injection Inject 60 mg into the skin every 6 (six) months. Administer in upper arm, thigh, or abdomen  .  escitalopram (LEXAPRO) 10 MG tablet Take 10 mg by mouth daily.  . feeding supplement, ENSURE ENLIVE, (ENSURE ENLIVE) LIQD Take 237 mLs by mouth 3 (three) times daily between meals. (Patient taking differently: Take 237 mLs by mouth daily. )  . Hyoscyamine Sulfate 0.375 MG TBCR Take 1 tablet by mouth every 12 (twelve) hours as needed.  Marland Kitchen LYRICA 150 MG capsule Take 150 mg by mouth 2 (two) times daily.   . metFORMIN (GLUCOPHAGE-XR) 500 MG 24 hr tablet Take 500 mg by mouth 2 (two) times a day.  . Multiple Vitamins-Minerals (EYE VITAMINS PO) Take 1 tablet by mouth daily.  Marland Kitchen MYRBETRIQ 50 MG TB24 tablet Take 50 mg by mouth at bedtime.   . predniSONE (STERAPRED UNI-PAK 21 TAB) 5 MG (21) TBPK tablet Take as instructed with food. 6,5,4,3,2,1 stop  . rosuvastatin (CRESTOR) 10 MG tablet Take 10 mg by mouth at bedtime.   Marland Kitchen VITAMIN D, CHOLECALCIFEROL, PO Take 2,000 Units by mouth 2 (two) times daily.   . vitamin E 400 UNIT capsule Take 400 Units by mouth 2 (two) times a day.    No current facility-administered medications for this visit. (Other)      REVIEW OF SYSTEMS:    ALLERGIES Allergies  Allergen Reactions  . Codeine Nausea Only  PAST MEDICAL HISTORY Past Medical History:  Diagnosis Date  . Arthritis    all over  . Cancer ([place])    breast  . Depression   . Diabetes mellitus without complication (HCC)    diet controlled  . Hypertension    Past Surgical History:  Procedure Laterality Date  . APPENDECTOMY     with lower back surgery  . BACK SURGERY     x2  . BREAST SURGERY Left 1995   complete left mastectomy  . CERVICAL SPINE SURGERY    . EYE SURGERY Bilateral    cataracts  . TONSILLECTOMY     at age 53    FAMILY HISTORY History reviewed. No pertinent family history.  SOCIAL HISTORY Social History   Tobacco Use  . Smoking status: Never Smoker  . Smokeless tobacco: Never Used  Vaping Use  . Vaping Use: Never used  Substance Use Topics  . Alcohol use: Never  .  Drug use: Never         OPHTHALMIC EXAM: Base Eye Exam    Visual Acuity (Snellen - Linear)      Right Left   Dist cc 20/50 + 20/25 -1   Dist ph cc 20/25 -2    Correction: Glasses       Tonometry (Tonopen, 2:34 PM)      Right Left   Pressure 17 13       Pupils      Pupils Dark Light Shape React APD   Right PERRL 3 2 Round Brisk None   Left PERRL 3 2 Round Brisk None       Visual Fields (Counting fingers)      Left Right    Full Full       Neuro/Psych    Oriented x3: Yes   Mood/Affect: Normal       Dilation    Both eyes: 1.0% Mydriacyl, 2.5% Phenylephrine @ 2:34 PM        Slit Lamp and Fundus Exam    External Exam      Right Left   External Normal Normal       Slit Lamp Exam      Right Left   Lens Centered posterior chamber intraocular lens Centered posterior chamber intraocular lens          IMAGING AND PROCEDURES  Imaging and Procedures for 07/26/19           ASSESSMENT/PLAN:  No problem-specific Assessment & Plan notes found for this encounter.      ICD-10-CM   1. Type 2 macular telangiectasis, left  H35.072 OCT, Retina - OU - Both Eyes  2. Retinal hemorrhage of left eye  H35.62   3. Type 2 macular telangiectasis, right  H35.071 OCT, Retina - OU - Both Eyes  4. Vitreomacular adhesion of right eye  H43.821   5. Vitreomacular adhesion of left eye  H43.822     1.  2.  3.  Ophthalmic Meds Ordered this visit:  No orders of the defined types were placed in this encounter.      No follow-ups on file.  There are no Patient Instructions on file for this visit.   Explained the diagnoses, plan, and follow up with the patient and they expressed understanding.  Patient expressed understanding of the importance of proper follow up care.   Clent Demark Jermichael Belmares M.D. Diseases & Surgery of the Retina and Vitreous [facility] [place] 07/26/19     Abbreviations: M myopia (nearsighted);  A astigmatism; H hyperopia (farsighted); P  presbyopia; Mrx spectacle prescription;  CTL contact lenses; OD right eye; OS left eye; OU both eyes  XT exotropia; ET esotropia; PEK punctate epithelial keratitis; PEE punctate epithelial erosions; DES dry eye syndrome; MGD meibomian gland dysfunction; ATs artificial tears; PFAT's preservative free artificial tears; [place] nuclear sclerotic cataract; PSC posterior subcapsular cataract; ERM epi-retinal membrane; PVD posterior vitreous detachment; RD retinal detachment; DM diabetes mellitus; DR diabetic retinopathy; NPDR non-proliferative diabetic retinopathy; PDR proliferative diabetic retinopathy; CSME clinically significant macular edema; DME diabetic macular edema; dbh dot blot hemorrhages; CWS cotton wool spot; POAG primary open angle glaucoma; C/D cup-to-disc ratio; HVF humphrey visual field; GVF goldmann visual field; OCT optical coherence tomography; IOP intraocular pressure; BRVO Branch retinal vein occlusion; CRVO central retinal vein occlusion; CRAO central retinal artery occlusion; BRAO branch retinal artery occlusion; RT retinal tear; SB scleral buckle; PPV pars plana vitrectomy; VH Vitreous hemorrhage; PRP panretinal laser photocoagulation; IVK intravitreal kenalog; VMT vitreomacular traction; MH Macular hole;  NVD neovascularization of the disc; NVE neovascularization elsewhere; AREDS age related eye disease study; ARMD age related macular degeneration; POAG primary open angle glaucoma; EBMD epithelial/anterior basement membrane dystrophy; ACIOL anterior chamber intraocular lens; IOL intraocular lens; PCIOL posterior chamber intraocular lens; Phaco/IOL phacoemulsification with intraocular lens placement; [place] photorefractive keratectomy; LASIK laser assisted in situ keratomileusis; HTN hypertension; DM diabetes mellitus; COPD chronic obstructive pulmonary disease

## 2019-07-26 NOTE — Assessment & Plan Note (Signed)
Macular telangiectasis (MAC-TEL), or parafoveal telangiectasis is a condition of "unknown" cause.  Findings in or near the macula (center of vision) consist of microaneurysms (leaking small capillaries), often with leakage of fluid which in the active phase can impact fine discriminatory vision, and in some cases trigger profound scarring in the macula, with severe permanent vision loss.  Standard treatment is observation and periodic examinations to monitor for treatable complications.   The cause  of this condition is "unknown".  However, the practice of Dr. Zadie Rhine has discovered an association with sleep apnea with its nightly periods of low oxygen in the blood stream (hypoxia), retained carbon dioxide (hypercapnia), associated with transient nocturnal hypertensive episodes.   More recently, some patients also been found to have advanced lung disease, whether asthma or COPD, with similar findings.  Dr. Zadie Rhine has been evaluating the association of sleep apnea, nightly hypoxia, and Macular telangiectasis for over 18 years.  Most patients are found to be noncompliant with sleep apnea therapy or testing in the past.  Resumption of CPAP or similar therapy is strongly recommended if ordered in the past.  Upon review of risk factors or findings positive for sleep apnea, more formal, extensive sleep laboratory or home testing, may be recommended.  Numerous patients, proven to have MAC-TEL, have improved or resolved their ey condition promptly, within weeks, of the use of nighttime oxygen supplementation or continuous positive airway pressure (CPAP).  I have encouraged the patient patient to follow-up with PCP with Dr. Brigitte Pulse, and consider home testing for sleep apnea at least as a screening.  If she test positive further testing may be appropriate for treatment may be undertaken.  Of encouraged her in her daughter to contact the office for repeat OCT testing prior to any treatment should sleep apnea be discovered and  therapy offered.  Then thereafter repeat the OCT 2 weeks after commencing therapy.

## 2019-07-27 ENCOUNTER — Encounter (HOSPITAL_COMMUNITY): Payer: Self-pay | Admitting: *Deleted

## 2019-07-27 ENCOUNTER — Emergency Department (HOSPITAL_COMMUNITY): Payer: Medicare Other

## 2019-07-27 ENCOUNTER — Inpatient Hospital Stay (HOSPITAL_COMMUNITY)
Admission: EM | Admit: 2019-07-27 | Discharge: 2019-08-06 | DRG: 871 | Disposition: A | Discharge status: Skilled Nursing Facility | Payer: Medicare Other | Attending: Family Medicine | Admitting: Family Medicine

## 2019-07-27 DIAGNOSIS — K529 Noninfective gastroenteritis and colitis, unspecified: Secondary | ICD-10-CM | POA: Diagnosis present

## 2019-07-27 DIAGNOSIS — I130 Hypertensive heart and chronic kidney disease with heart failure and stage 1 through stage 4 chronic kidney disease, or unspecified chronic kidney disease: Secondary | ICD-10-CM | POA: Diagnosis present

## 2019-07-27 DIAGNOSIS — K5190 Ulcerative colitis, unspecified, without complications: Secondary | ICD-10-CM | POA: Diagnosis present

## 2019-07-27 DIAGNOSIS — R197 Diarrhea, unspecified: Secondary | ICD-10-CM

## 2019-07-27 DIAGNOSIS — Q453 Other congenital malformations of pancreas and pancreatic duct: Secondary | ICD-10-CM

## 2019-07-27 DIAGNOSIS — K8020 Calculus of gallbladder without cholecystitis without obstruction: Secondary | ICD-10-CM | POA: Diagnosis present

## 2019-07-27 DIAGNOSIS — Z20822 Contact with and (suspected) exposure to covid-19: Secondary | ICD-10-CM | POA: Diagnosis present

## 2019-07-27 DIAGNOSIS — E1122 Type 2 diabetes mellitus with diabetic chronic kidney disease: Secondary | ICD-10-CM | POA: Diagnosis present

## 2019-07-27 DIAGNOSIS — E119 Type 2 diabetes mellitus without complications: Secondary | ICD-10-CM

## 2019-07-27 DIAGNOSIS — K6389 Other specified diseases of intestine: Secondary | ICD-10-CM | POA: Diagnosis present

## 2019-07-27 DIAGNOSIS — K668 Other specified disorders of peritoneum: Secondary | ICD-10-CM

## 2019-07-27 DIAGNOSIS — F418 Other specified anxiety disorders: Secondary | ICD-10-CM | POA: Diagnosis present

## 2019-07-27 DIAGNOSIS — N183 Chronic kidney disease, stage 3 unspecified: Secondary | ICD-10-CM

## 2019-07-27 DIAGNOSIS — Z8249 Family history of ischemic heart disease and other diseases of the circulatory system: Secondary | ICD-10-CM

## 2019-07-27 DIAGNOSIS — E785 Hyperlipidemia, unspecified: Secondary | ICD-10-CM | POA: Diagnosis present

## 2019-07-27 DIAGNOSIS — Z885 Allergy status to narcotic agent status: Secondary | ICD-10-CM

## 2019-07-27 DIAGNOSIS — J9601 Acute respiratory failure with hypoxia: Secondary | ICD-10-CM

## 2019-07-27 DIAGNOSIS — K633 Ulcer of intestine: Secondary | ICD-10-CM | POA: Diagnosis not present

## 2019-07-27 DIAGNOSIS — Z853 Personal history of malignant neoplasm of breast: Secondary | ICD-10-CM

## 2019-07-27 DIAGNOSIS — E860 Dehydration: Secondary | ICD-10-CM | POA: Diagnosis present

## 2019-07-27 DIAGNOSIS — A419 Sepsis, unspecified organism: Principal | ICD-10-CM

## 2019-07-27 DIAGNOSIS — R152 Fecal urgency: Secondary | ICD-10-CM | POA: Diagnosis present

## 2019-07-27 DIAGNOSIS — R1909 Other intra-abdominal and pelvic swelling, mass and lump: Secondary | ICD-10-CM | POA: Diagnosis present

## 2019-07-27 DIAGNOSIS — K219 Gastro-esophageal reflux disease without esophagitis: Secondary | ICD-10-CM | POA: Diagnosis present

## 2019-07-27 DIAGNOSIS — I5033 Acute on chronic diastolic (congestive) heart failure: Secondary | ICD-10-CM | POA: Diagnosis present

## 2019-07-27 DIAGNOSIS — D72828 Other elevated white blood cell count: Secondary | ICD-10-CM | POA: Diagnosis present

## 2019-07-27 DIAGNOSIS — N1831 Chronic kidney disease, stage 3a: Secondary | ICD-10-CM | POA: Diagnosis present

## 2019-07-27 DIAGNOSIS — I4891 Unspecified atrial fibrillation: Secondary | ICD-10-CM | POA: Diagnosis not present

## 2019-07-27 DIAGNOSIS — Z9012 Acquired absence of left breast and nipple: Secondary | ICD-10-CM

## 2019-07-27 DIAGNOSIS — Z66 Do not resuscitate: Secondary | ICD-10-CM | POA: Diagnosis present

## 2019-07-27 DIAGNOSIS — N179 Acute kidney failure, unspecified: Secondary | ICD-10-CM | POA: Diagnosis present

## 2019-07-27 DIAGNOSIS — I10 Essential (primary) hypertension: Secondary | ICD-10-CM

## 2019-07-27 DIAGNOSIS — R0602 Shortness of breath: Secondary | ICD-10-CM

## 2019-07-27 DIAGNOSIS — N3281 Overactive bladder: Secondary | ICD-10-CM | POA: Diagnosis present

## 2019-07-27 DIAGNOSIS — I447 Left bundle-branch block, unspecified: Secondary | ICD-10-CM | POA: Diagnosis present

## 2019-07-27 DIAGNOSIS — I4819 Other persistent atrial fibrillation: Secondary | ICD-10-CM | POA: Diagnosis not present

## 2019-07-27 DIAGNOSIS — R0902 Hypoxemia: Secondary | ICD-10-CM

## 2019-07-27 DIAGNOSIS — I361 Nonrheumatic tricuspid (valve) insufficiency: Secondary | ICD-10-CM | POA: Diagnosis not present

## 2019-07-27 DIAGNOSIS — I480 Paroxysmal atrial fibrillation: Secondary | ICD-10-CM | POA: Diagnosis not present

## 2019-07-27 DIAGNOSIS — R1900 Intra-abdominal and pelvic swelling, mass and lump, unspecified site: Secondary | ICD-10-CM

## 2019-07-27 DIAGNOSIS — I340 Nonrheumatic mitral (valve) insufficiency: Secondary | ICD-10-CM | POA: Diagnosis not present

## 2019-07-27 DIAGNOSIS — D509 Iron deficiency anemia, unspecified: Secondary | ICD-10-CM | POA: Diagnosis present

## 2019-07-27 DIAGNOSIS — E1142 Type 2 diabetes mellitus with diabetic polyneuropathy: Secondary | ICD-10-CM | POA: Diagnosis present

## 2019-07-27 DIAGNOSIS — R339 Retention of urine, unspecified: Secondary | ICD-10-CM | POA: Diagnosis present

## 2019-07-27 DIAGNOSIS — Z7984 Long term (current) use of oral hypoglycemic drugs: Secondary | ICD-10-CM

## 2019-07-27 DIAGNOSIS — E876 Hypokalemia: Secondary | ICD-10-CM | POA: Diagnosis not present

## 2019-07-27 DIAGNOSIS — E872 Acidosis: Secondary | ICD-10-CM | POA: Diagnosis present

## 2019-07-27 DIAGNOSIS — Z7982 Long term (current) use of aspirin: Secondary | ICD-10-CM

## 2019-07-27 DIAGNOSIS — Z79899 Other long term (current) drug therapy: Secondary | ICD-10-CM

## 2019-07-27 DIAGNOSIS — Z9049 Acquired absence of other specified parts of digestive tract: Secondary | ICD-10-CM

## 2019-07-27 DIAGNOSIS — T380X5A Adverse effect of glucocorticoids and synthetic analogues, initial encounter: Secondary | ICD-10-CM | POA: Diagnosis present

## 2019-07-27 DIAGNOSIS — Z87440 Personal history of urinary (tract) infections: Secondary | ICD-10-CM

## 2019-07-27 DIAGNOSIS — I272 Pulmonary hypertension, unspecified: Secondary | ICD-10-CM | POA: Diagnosis present

## 2019-07-27 LAB — CBC WITH DIFFERENTIAL/PLATELET
Abs Immature Granulocytes: 0 10*3/uL (ref 0.00–0.07)
Band Neutrophils: 13 %
Basophils Absolute: 0 10*3/uL (ref 0.0–0.1)
Basophils Relative: 0 %
Blasts: 0 %
Eosinophils Absolute: 0 10*3/uL (ref 0.0–0.5)
Eosinophils Relative: 0 %
HCT: 36.1 % (ref 36.0–46.0)
Hemoglobin: 11.4 g/dL — ABNORMAL LOW (ref 12.0–15.0)
Lymphocytes Relative: 18 %
Lymphs Abs: 3.1 10*3/uL (ref 0.7–4.0)
MCH: 28.4 pg (ref 26.0–34.0)
MCHC: 31.6 g/dL (ref 30.0–36.0)
MCV: 89.8 fL (ref 80.0–100.0)
Metamyelocytes Relative: 0 %
Monocytes Absolute: 0.7 10*3/uL (ref 0.1–1.0)
Monocytes Relative: 4 %
Myelocytes: 0 %
Neutro Abs: 13.5 10*3/uL — ABNORMAL HIGH (ref 1.7–7.7)
Neutrophils Relative %: 65 %
Other: 0 %
Platelets: 144 10*3/uL — ABNORMAL LOW (ref 150–400)
Promyelocytes Relative: 0 %
RBC: 4.02 MIL/uL (ref 3.87–5.11)
RDW: 15 % (ref 11.5–15.5)
WBC: 17.3 10*3/uL — ABNORMAL HIGH (ref 4.0–10.5)
nRBC: 0 % (ref 0.0–0.2)
nRBC: 0 /100 WBC

## 2019-07-27 LAB — COMPREHENSIVE METABOLIC PANEL
ALT: 15 U/L (ref 0–44)
AST: 23 U/L (ref 15–41)
Albumin: 3.3 g/dL — ABNORMAL LOW (ref 3.5–5.0)
Alkaline Phosphatase: 43 U/L (ref 38–126)
Anion gap: 10 (ref 5–15)
BUN: 45 mg/dL — ABNORMAL HIGH (ref 8–23)
CO2: 17 mmol/L — ABNORMAL LOW (ref 22–32)
Calcium: 8.8 mg/dL — ABNORMAL LOW (ref 8.9–10.3)
Chloride: 112 mmol/L — ABNORMAL HIGH (ref 98–111)
Creatinine, Ser: 1.77 mg/dL — ABNORMAL HIGH (ref 0.44–1.00)
GFR calc Af Amer: 29 mL/min — ABNORMAL LOW (ref >60)
GFR calc non Af Amer: 25 mL/min — ABNORMAL LOW (ref >60)
Glucose, Bld: 129 mg/dL — ABNORMAL HIGH (ref 70–99)
Potassium: 4 mmol/L (ref 3.5–5.1)
Sodium: 139 mmol/L (ref 135–145)
Total Bilirubin: 0.7 mg/dL (ref 0.3–1.2)
Total Protein: 6.8 g/dL (ref 6.5–8.1)

## 2019-07-27 LAB — LACTIC ACID, PLASMA: Lactic Acid, Venous: 1.9 mmol/L (ref 0.5–1.9)

## 2019-07-27 LAB — PROTIME-INR
INR: 1.2 (ref 0.8–1.2)
Prothrombin Time: 14.3 seconds (ref 11.4–15.2)

## 2019-07-27 LAB — GLUCOSE, CAPILLARY: Glucose-Capillary: 88 mg/dL (ref 70–99)

## 2019-07-27 LAB — LIPASE, BLOOD: Lipase: 17 U/L (ref 11–51)

## 2019-07-27 LAB — APTT: aPTT: 35 seconds (ref 24–36)

## 2019-07-27 LAB — SARS CORONAVIRUS 2 BY RT PCR (HOSPITAL ORDER, PERFORMED IN ~~LOC~~ HOSPITAL LAB): SARS Coronavirus 2: NEGATIVE

## 2019-07-27 MED ORDER — ONDANSETRON HCL 4 MG/2ML IJ SOLN: 4.0000 mg | Freq: Four times a day (QID) | INTRAMUSCULAR | Status: DC | PRN

## 2019-07-27 MED ORDER — SODIUM CHLORIDE 0.9 % IV SOLN
2.0000 g | Freq: Once | INTRAVENOUS | Status: AC
  Administered 2019-07-27: 2 g via INTRAVENOUS
  Filled 2019-07-27: qty 2

## 2019-07-27 MED ORDER — MIRABEGRON ER 25 MG PO TB24
50.0000 mg | ORAL_TABLET | Freq: Every day | ORAL | Status: DC
  Administered 2019-07-27 – 2019-08-04 (×9): 50 mg via ORAL
  Filled 2019-07-27 (×9): qty 2

## 2019-07-27 MED ORDER — ACETAMINOPHEN 650 MG RE SUPP: 650.0000 mg | Freq: Four times a day (QID) | RECTAL | Status: DC | PRN

## 2019-07-27 MED ORDER — ONDANSETRON HCL 4 MG PO TABS: 4.0000 mg | ORAL_TABLET | Freq: Four times a day (QID) | ORAL | Status: DC | PRN

## 2019-07-27 MED ORDER — ONDANSETRON HCL 4 MG/2ML IJ SOLN
4.0000 mg | Freq: Once | INTRAMUSCULAR | Status: AC
  Administered 2019-07-27: 4 mg via INTRAVENOUS
  Filled 2019-07-27: qty 2

## 2019-07-27 MED ORDER — PANTOPRAZOLE SODIUM 40 MG PO TBEC
40.0000 mg | DELAYED_RELEASE_TABLET | Freq: Every day | ORAL | Status: DC
  Administered 2019-07-28 – 2019-08-06 (×10): 40 mg via ORAL
  Filled 2019-07-27 (×10): qty 1

## 2019-07-27 MED ORDER — PREGABALIN 75 MG PO CAPS: 150.0000 mg | ORAL_CAPSULE | Freq: Two times a day (BID) | ORAL | Status: DC

## 2019-07-27 MED ORDER — LACTATED RINGERS IV BOLUS
1000.0000 mL | Freq: Once | INTRAVENOUS | Status: AC
  Administered 2019-07-27: 1000 mL via INTRAVENOUS

## 2019-07-27 MED ORDER — FENTANYL CITRATE (PF) 100 MCG/2ML IJ SOLN
12.5000 ug | Freq: Once | INTRAMUSCULAR | Status: AC
  Administered 2019-07-27: 12.5 ug via INTRAVENOUS
  Filled 2019-07-27: qty 2

## 2019-07-27 MED ORDER — MESALAMINE 1.2 G PO TBEC
2.4000 g | DELAYED_RELEASE_TABLET | Freq: Every day | ORAL | Status: DC
  Administered 2019-07-28 – 2019-08-01 (×5): 2.4 g via ORAL
  Filled 2019-07-27 (×9): qty 2

## 2019-07-27 MED ORDER — LACTATED RINGERS IV BOLUS (SEPSIS)
1000.0000 mL | Freq: Once | INTRAVENOUS | Status: AC
  Administered 2019-07-27: 1000 mL via INTRAVENOUS

## 2019-07-27 MED ORDER — INSULIN ASPART 100 UNIT/ML ~~LOC~~ SOLN
0.0000 [IU] | Freq: Every day | SUBCUTANEOUS | Status: DC
  Administered 2019-07-29: 3 [IU] via SUBCUTANEOUS
  Administered 2019-07-30: 5 [IU] via SUBCUTANEOUS
  Administered 2019-07-31 – 2019-08-02 (×2): 3 [IU] via SUBCUTANEOUS
  Administered 2019-08-03 – 2019-08-05 (×3): 2 [IU] via SUBCUTANEOUS
  Filled 2019-07-27: qty 0.05

## 2019-07-27 MED ORDER — PIPERACILLIN-TAZOBACTAM IN DEX 2-0.25 GM/50ML IV SOLN
2.2500 g | Freq: Four times a day (QID) | INTRAVENOUS | Status: DC
  Administered 2019-07-28 (×2): 2.25 g via INTRAVENOUS
  Filled 2019-07-27 (×3): qty 50

## 2019-07-27 MED ORDER — LOPERAMIDE HCL 2 MG PO CAPS
2.0000 mg | ORAL_CAPSULE | Freq: Four times a day (QID) | ORAL | Status: DC | PRN
  Filled 2019-07-27: qty 1

## 2019-07-27 MED ORDER — LACTATED RINGERS IV BOLUS (SEPSIS)
500.0000 mL | Freq: Once | INTRAVENOUS | Status: AC
  Administered 2019-07-27: 500 mL via INTRAVENOUS

## 2019-07-27 MED ORDER — OXYCODONE HCL 5 MG PO TABS
2.5000 mg | ORAL_TABLET | ORAL | Status: DC | PRN
  Administered 2019-07-28: 2.5 mg via ORAL
  Filled 2019-07-27: qty 1

## 2019-07-27 MED ORDER — MORPHINE SULFATE (PF) 2 MG/ML IV SOLN
2.0000 mg | INTRAVENOUS | Status: DC | PRN
  Filled 2019-07-27: qty 1

## 2019-07-27 MED ORDER — ACETAMINOPHEN 325 MG PO TABS: 650.0000 mg | ORAL_TABLET | Freq: Four times a day (QID) | ORAL | Status: DC | PRN

## 2019-07-27 MED ORDER — BUPROPION HCL ER (XL) 150 MG PO TB24
150.0000 mg | ORAL_TABLET | Freq: Every day | ORAL | Status: DC
  Administered 2019-07-28 – 2019-08-06 (×10): 150 mg via ORAL
  Filled 2019-07-27 (×10): qty 1

## 2019-07-27 MED ORDER — METRONIDAZOLE IN NACL 5-0.79 MG/ML-% IV SOLN
500.0000 mg | Freq: Once | INTRAVENOUS | Status: AC
  Administered 2019-07-27: 500 mg via INTRAVENOUS
  Filled 2019-07-27: qty 100

## 2019-07-27 MED ORDER — VITAMIN B-12 1000 MCG PO TABS
1000.0000 ug | ORAL_TABLET | Freq: Every day | ORAL | Status: DC
  Administered 2019-07-28 – 2019-08-06 (×10): 1000 ug via ORAL
  Filled 2019-07-27 (×10): qty 1

## 2019-07-27 MED ORDER — HEPARIN SODIUM (PORCINE) 5000 UNIT/ML IJ SOLN
5000.0000 [IU] | Freq: Three times a day (TID) | INTRAMUSCULAR | Status: DC
  Administered 2019-07-27 – 2019-07-30 (×8): 5000 [IU] via SUBCUTANEOUS
  Filled 2019-07-27 (×8): qty 1

## 2019-07-27 MED ORDER — LACTATED RINGERS IV SOLN: INTRAVENOUS | Status: DC

## 2019-07-27 MED ORDER — PREGABALIN 75 MG PO CAPS
75.0000 mg | ORAL_CAPSULE | Freq: Two times a day (BID) | ORAL | Status: DC
  Administered 2019-07-27 – 2019-08-06 (×20): 75 mg via ORAL
  Filled 2019-07-27 (×20): qty 1

## 2019-07-27 MED ORDER — MORPHINE SULFATE (PF) 2 MG/ML IV SOLN
1.0000 mg | INTRAVENOUS | Status: DC | PRN
  Administered 2019-07-27: 1 mg via INTRAVENOUS

## 2019-07-27 MED ORDER — INSULIN ASPART 100 UNIT/ML ~~LOC~~ SOLN
0.0000 [IU] | Freq: Three times a day (TID) | SUBCUTANEOUS | Status: DC
  Administered 2019-07-29: 3 [IU] via SUBCUTANEOUS
  Administered 2019-07-29 – 2019-07-30 (×3): 2 [IU] via SUBCUTANEOUS
  Administered 2019-07-30: 3 [IU] via SUBCUTANEOUS
  Administered 2019-07-30: 2 [IU] via SUBCUTANEOUS
  Administered 2019-07-31: 5 [IU] via SUBCUTANEOUS
  Administered 2019-07-31: 9 [IU] via SUBCUTANEOUS
  Administered 2019-07-31 – 2019-08-01 (×2): 2 [IU] via SUBCUTANEOUS
  Administered 2019-08-01: 7 [IU] via SUBCUTANEOUS
  Administered 2019-08-01 – 2019-08-02 (×2): 2 [IU] via SUBCUTANEOUS
  Administered 2019-08-02: 3 [IU] via SUBCUTANEOUS
  Administered 2019-08-03: 5 [IU] via SUBCUTANEOUS
  Administered 2019-08-03: 3 [IU] via SUBCUTANEOUS
  Administered 2019-08-04: 2 [IU] via SUBCUTANEOUS
  Administered 2019-08-04: 5 [IU] via SUBCUTANEOUS
  Administered 2019-08-05 (×2): 3 [IU] via SUBCUTANEOUS
  Administered 2019-08-05: 9 [IU] via SUBCUTANEOUS
  Administered 2019-08-06 (×2): 2 [IU] via SUBCUTANEOUS

## 2019-07-27 MED ORDER — ESCITALOPRAM OXALATE 10 MG PO TABS
10.0000 mg | ORAL_TABLET | Freq: Every day | ORAL | Status: DC
  Administered 2019-07-28 – 2019-08-06 (×10): 10 mg via ORAL
  Filled 2019-07-27 (×10): qty 1

## 2019-07-27 NOTE — Progress Notes (Signed)
MEDICATION RELATED CONSULT NOTE - INITIAL   Pharmacy Consult for Pregabalin Indication: Renal adjustment  Allergies  Allergen Reactions  . Codeine Nausea Only    Patient Measurements: Height: 5\' 2"  (157.5 cm) Weight: 50.2 kg (110 lb 11.2 oz) IBW/kg (Calculated) : 50.1 Adjusted Body Weight:   Vital Signs: Temp: 98.4 F (36.9 C) (06/18 2027) Temp Source: Oral (06/18 2027) BP: 134/59 (06/18 2027) Pulse Rate: 110 (06/18 2027) Intake/Output from previous day: No intake/output data recorded. Intake/Output from this shift: No intake/output data recorded.  Labs: Recent Labs    07/27/19 1414  WBC 17.3*  HGB 11.4*  HCT 36.1  PLT 144*  APTT 35  CREATININE 1.77*  ALBUMIN 3.3*  PROT 6.8  AST 23  ALT 15  ALKPHOS 43  BILITOT 0.7   Estimated Creatinine Clearance: 17.7 mL/min (A) (by C-G formula based on SCr of 1.77 mg/dL (H)).   Microbiology: Recent Results (from the past 720 hour(s))  Culture, blood (Routine X 2) w Reflex to ID Panel     Status: None (Preliminary result)   Collection Time: 07/27/19  1:24 PM   Specimen: BLOOD RIGHT HAND  Result Value Ref Range Status   Specimen Description   Final    BLOOD RIGHT HAND Performed at [facility], [place] [address] [place], [place] [address]    Special Requests   Final    BOTTLES DRAWN AEROBIC AND ANAEROBIC Blood Culture adequate volume Performed at [facility] [address] [place], [place] [address]    Culture PENDING  Incomplete   Report Status PENDING  Incomplete  Culture, blood (Routine X 2) w Reflex to ID Panel     Status: None (Preliminary result)   Collection Time: 07/27/19  1:29 PM   Specimen: BLOOD  Result Value Ref Range Status   Specimen Description   Final    BLOOD LEFT ANTECUBITAL Performed at [facility] [address], [place], [place] [address]    Special Requests   Final    BOTTLES DRAWN AEROBIC AND ANAEROBIC Blood Culture results may not be optimal  due to an inadequate volume of blood received in culture bottles Performed at [facility] [address] [place], [place] [address]    Culture PENDING  Incomplete   Report Status PENDING  Incomplete  SARS Coronavirus 2 by RT PCR (hospital order, performed in [facility] hospital lab) Nasopharyngeal Nasopharyngeal Swab     Status: None   Collection Time: 07/27/19  4:21 PM   Specimen: Nasopharyngeal Swab  Result Value Ref Range Status   SARS Coronavirus 2 NEGATIVE NEGATIVE Final    Comment: (NOTE) SARS-CoV-2 target nucleic acids are NOT DETECTED.  The SARS-CoV-2 RNA is generally detectable in upper and lower respiratory specimens during the acute phase of infection. The lowest concentration of SARS-CoV-2 viral copies this assay can detect is 250 copies / mL. A negative result does not preclude SARS-CoV-2 infection and should not be used as the sole basis for treatment or other patient management decisions.  A negative result may occur with improper specimen collection / handling, submission of specimen other than nasopharyngeal swab, presence of viral mutation(s) within the areas targeted by this assay, and inadequate number of viral copies (<250 copies / mL). A negative result must be combined with clinical observations, patient history, and epidemiological information.  Fact Sheet for Patients:   StrictlyIdeas.no  Fact Sheet for Healthcare Providers: BankingDealers.co.za  This test is not yet approved or  cleared by the [place] and has been authorized for detection and/or diagnosis of SARS-CoV-2 by FDA under an Emergency Use Authorization (EUA).  This EUA will remain in effect (meaning this test can be used) for the duration of the COVID-19 declaration under Section 564(b)(1) of the Act, 21 U.S.C. section 360bbb-3(b)(1), unless the authorization is terminated or revoked sooner.  Performed at [facility], [place] [address], [place], [place] [address]     Medical History: Past Medical History:  Diagnosis Date  . Arthritis    all over  . Cancer ([place])    breast  . Depression   . Diabetes mellitus without complication (HCC)    diet controlled  . Hypertension     Medications:  Medications Prior to Admission  Medication Sig Dispense Refill Last Dose  . acetaminophen (TYLENOL) 325 MG tablet Take 650 mg by mouth 2 (two) times a day.    07/26/2019 at Unknown time  . amLODipine-valsartan (EXFORGE) 5-160 MG tablet Take 1 tablet by mouth daily.   07/27/2019 at Unknown time  . aspirin EC 81 MG tablet Take 81 mg by mouth at bedtime.    07/27/2019 at Unknown time  . buPROPion (WELLBUTRIN XL) 150 MG 24 hr tablet Take 150 mg by mouth daily.   07/27/2019 at Unknown time  . carvedilol (COREG) 12.5 MG tablet Take 12.5 mg by mouth 2 (two) times daily with a meal.    07/27/2019 at 1030  . Cyanocobalamin (VITAMIN B 12 PO) Take 1,000 mcg by mouth daily.   07/27/2019 at Unknown time  . cyclobenzaprine (FLEXERIL) 5 MG tablet Take 5 mg by mouth every 8 (eight) hours as needed for muscle spasms.   Past Month at Unknown time  . denosumab (PROLIA) 60 MG/ML SOLN injection Inject 60 mg into the skin every 6 (six) months. Administer in upper arm, thigh, or abdomen     . diclofenac Sodium (VOLTAREN) 1 % GEL Apply 2 g topically 4 (four) times daily as needed (painful joints).   Past Week at Unknown time  . escitalopram (LEXAPRO) 10 MG tablet Take 10 mg by mouth daily.   07/27/2019 at Unknown time  . esomeprazole (NEXIUM) 40 MG capsule Take 40 mg by mouth daily at 12 noon.   07/27/2019 at Unknown time  . hydrochlorothiazide (MICROZIDE) 12.5 MG capsule Take 12.5 mg by mouth daily.   07/27/2019 at Unknown time  . loperamide (IMODIUM A-D) 2 MG tablet Take 2 mg by mouth 4 (four) times daily as needed for diarrhea or loose stools.   07/27/2019 at Unknown time  . LYRICA 150 MG capsule Take 150 mg by mouth 2 (two) times daily.     07/27/2019 at Unknown time  . mesalamine (LIALDA) 1.2 g EC tablet Take 2.4 g by mouth daily with breakfast.   07/27/2019 at Unknown time  . metFORMIN (GLUCOPHAGE) 500 MG tablet Take 500 mg by mouth daily with breakfast.   07/27/2019 at Unknown time  . MYRBETRIQ 50 MG TB24 tablet Take 50 mg by mouth at bedtime.    07/25/2019  . Probiotic Product (PROBIOTIC-10 PO) Take 1 capsule by mouth daily.   07/27/2019 at Unknown time  . rosuvastatin (CRESTOR) 10 MG tablet Take 10 mg by mouth at bedtime.    07/25/2019  . SYMAX-SR 0.375 MG 12 hr tablet Take 0.375 mg by mouth every 12 (twelve) hours as needed for pain.   07/27/2019 at Unknown time  . traMADol (ULTRAM) 50 MG tablet Take 50  mg by mouth every 8 (eight) hours as needed for moderate pain.   Past Week at Unknown time  . VITAMIN D, CHOLECALCIFEROL, PO Take 400 Units by mouth daily.    07/27/2019 at Unknown time  . vitamin E 400 UNIT capsule Take 400 Units by mouth 2 (two) times a day.    07/27/2019 at Unknown time  . cephALEXin (KEFLEX) 500 MG capsule Take 1 capsule (500 mg total) by mouth 3 (three) times daily. (Patient not taking: Reported on 09/13/2018) 21 capsule 0   . feeding supplement, ENSURE ENLIVE, (ENSURE ENLIVE) LIQD Take 237 mLs by mouth 3 (three) times daily between meals. (Patient not taking: Reported on 07/27/2019) 237 mL 12 Not Taking at Unknown time  . predniSONE (STERAPRED UNI-PAK 21 TAB) 5 MG (21) TBPK tablet Take as instructed with food. 6,5,4,3,2,1 stop (Patient not taking: Reported on 07/27/2019) 21 tablet 0 Not Taking at Unknown time   Scheduled:  . [START ON 07/28/2019] buPROPion  150 mg Oral Daily  . [START ON 07/28/2019] escitalopram  10 mg Oral Daily  . heparin  5,000 Units Subcutaneous Q8H  . insulin aspart  0-5 Units Subcutaneous QHS  . [START ON 07/28/2019] insulin aspart  0-9 Units Subcutaneous TID WC  . [START ON 07/28/2019] mesalamine  2.4 g Oral Q breakfast  . mirabegron ER  50 mg Oral QHS  . [START ON 07/28/2019] pantoprazole  40  mg Oral Daily  . pregabalin  75 mg Oral BID  . [START ON 07/28/2019] vitamin B-12  1,000 mcg Oral Daily    Assessment: Patient taking 150mg  pregabalin PO BID per med rec and continued on admission.  Patient with very poor renal function (<65mL/min) due to age, weight and elevated SCr.  Per pregabalin dosing information: "Renal impairment (CrCl 15 to 30 mL/min) in adults: 25 to 150 mg/day given once daily or in 2 divided doses"  Max per day 150mg   Goal of Therapy:  Safe and effective use of pregabalin  Plan:  Change pregabalin to 75mg  bid per manufacturer guidance and [facility] renal adjustment policy.   Nani Skillern [place] 07/27/2019,9:50 PM

## 2019-07-27 NOTE — Progress Notes (Signed)
Pharmacy Antibiotic Note  Deanna [name] is a 86 y.o. female admitted on 07/27/2019 with IAI.  Pharmacy has been consulted for Zosyn dosing.  Plan: Zosyn 2.25g IV q6 per current renal function  Height: 5\' 2"  (157.5 cm) Weight: 48.5 kg (107 lb) IBW/kg (Calculated) : 50.1  Temp (24hrs), Avg:100.8 F (38.2 C), Min:100.8 F (38.2 C), Max:100.8 F (38.2 C)  Recent Labs  Lab 07/27/19 1414  WBC 17.3*  CREATININE 1.77*  LATICACIDVEN 1.9    Estimated Creatinine Clearance: 17.1 mL/min (A) (by C-G formula based on SCr of 1.77 mg/dL (H)).    Allergies  Allergen Reactions  . Codeine Nausea Only     Thank you for allowing pharmacy to be a part of this patient's care.  Kara Mead 07/27/2019 7:13 PM

## 2019-07-27 NOTE — ED Notes (Signed)
Purewick placed on pt. Pt aware of urine specimen collection need.

## 2019-07-27 NOTE — Progress Notes (Signed)
A consult was received from an ED physician for Cefepime per pharmacy dosing.  The patient's profile has been reviewed for ht/wt/allergies/indication/available labs.    A one time order has been placed for Cefepime 2gm & Flagyl 500mg  IV per MD.  Further antibiotics/pharmacy consults should be ordered by admitting physician if indicated.      Would consider using single abx Zosyn for gram negative and anaerobic coverage.                    Thank you,  Minda Ditto PharmD 07/27/2019  2:06 PM

## 2019-07-27 NOTE — ED Triage Notes (Signed)
86 yo female from home BIBA. Pt was on her way to her PCP today when she felt too weak to get out. PCP called 911. Pt was pale, lethargic, hypotensive at 78/40 for EMS. Pt is A/Ox4 and c/o 6 weeks of abd pain, vomiting and diarrhea. Sts neighbors have been sick with similar symptoms. Pt received 770ml NS en route. Repeat BP 89/40. Sat 90%RA, 2LNC applied en route. Sat improved to 95%.   Hx LBBB, NIDDM  CBG=194  IV #20 Right wrist.

## 2019-07-27 NOTE — H&P (Signed)
History and Physical    Deanna [name] AOZ:[id number] DOB: 1930/07/17 DOA: 07/27/2019  PCP: Marton Redwood, MD  Patient coming from: home  I have personally briefly reviewed patient's old medical records in [place]  Chief Complaint: diarrhea  HPI: Deanna [name] is Deanna [name] 86 y.o. [name] with medical history significant of T2DM, HTN, depression, breast cancer, inflammatory diarrhea who presents with worsening symptoms of diarrhea for the last 6 weeks.  She notes during this time she's had diarrhea, less than half of the days of the week.  She typically takes imodium which improves the diarrhea.  Described as watery diarrhea.  Yesterday, she started having severe lower abdominal pain, cramping, tender to touch and with movement.  Had nausea/vomiting yesterday as well.  She went to doctor, but was too weak to get out of car, BP in the 70's on EMS arrival.  She denies fevers, chills, flushing, nausea, shortness of breath.  Last week, people who lived in the same place as her had Deanna [name] stomach bug, but she wasn't around them.  No recent abx use.  No smoking.  Rare etoh.  Follows with Dr. Earlean Shawl outpatient.    ED Course: Labs, EKG, imaging, abx, IVF.  Hospitalist for admission.  Review of Systems: As per HPI otherwise all other systems reviewed and are negative.  Past Medical History:  Diagnosis Date  . Arthritis    all over  . Cancer ([place])    breast  . Depression   . Diabetes mellitus without complication (HCC)    diet controlled  . Hypertension     Past Surgical History:  Procedure Laterality Date  . APPENDECTOMY     with lower back surgery  . BACK SURGERY     x2  . BREAST SURGERY Left 1995   complete left mastectomy  . CERVICAL SPINE SURGERY    . EYE SURGERY Bilateral    cataracts  . TONSILLECTOMY     at age 63    Social History  reports that she has never smoked. She has never used smokeless tobacco. She reports that she does not drink alcohol and does not use  drugs.  Allergies  Allergen Reactions  . Codeine Nausea Only    No family history on file.   Prior to Admission medications   Medication Sig Start Date End Date Taking? Authorizing Provider  acetaminophen (TYLENOL) 325 MG tablet Take 650 mg by mouth 2 (two) times Parisa Pinela day.    Yes [provider]  amLODipine-valsartan (EXFORGE) 5-160 MG tablet Take 1 tablet by mouth daily.   Yes [provider]  aspirin EC 81 MG tablet Take 81 mg by mouth at bedtime.    Yes [provider]  buPROPion (WELLBUTRIN XL) 150 MG 24 hr tablet Take 150 mg by mouth daily.   Yes [provider]  carvedilol (COREG) 12.5 MG tablet Take 12.5 mg by mouth 2 (two) times daily with Lennyn Gange meal.  01/07/16  Yes [provider]  Cyanocobalamin (VITAMIN B 12 PO) Take 1,000 mcg by mouth daily.   Yes [provider]  cyclobenzaprine (FLEXERIL) 5 MG tablet Take 5 mg by mouth every 8 (eight) hours as needed for muscle spasms.   Yes [provider]  denosumab (PROLIA) 60 MG/ML SOLN injection Inject 60 mg into the skin every 6 (six) months. Administer in upper arm, thigh, or abdomen   Yes [provider]  diclofenac Sodium (VOLTAREN) 1 % GEL Apply 2 g topically 4 (four) times  daily as needed (painful joints).   Yes [provider]  escitalopram (LEXAPRO) 10 MG tablet Take 10 mg by mouth daily. 04/12/18  Yes [provider]  esomeprazole (NEXIUM) 40 MG capsule Take 40 mg by mouth daily at 12 noon.   Yes [provider]  hydrochlorothiazide (MICROZIDE) 12.5 MG capsule Take 12.5 mg by mouth daily.   Yes [provider]  loperamide (IMODIUM Chen Saadeh-D) 2 MG tablet Take 2 mg by mouth 4 (four) times daily as needed for diarrhea or loose stools.   Yes [provider]  LYRICA 150 MG capsule Take 150 mg by mouth 2 (two) times daily.  11/03/15  Yes [provider]  mesalamine (LIALDA) 1.2 g EC tablet Take 2.4 g by mouth daily with  breakfast.   Yes [provider]  metFORMIN (GLUCOPHAGE) 500 MG tablet Take 500 mg by mouth daily with breakfast.   Yes [provider]  MYRBETRIQ 50 MG TB24 tablet Take 50 mg by mouth at bedtime.  10/21/15  Yes [provider]  Probiotic Product (PROBIOTIC-10 PO) Take 1 capsule by mouth daily.   Yes [provider]  rosuvastatin (CRESTOR) 10 MG tablet Take 10 mg by mouth at bedtime.    Yes [provider]  SYMAX-SR 0.375 MG 12 hr tablet Take 0.375 mg by mouth every 12 (twelve) hours as needed for pain. 05/10/19  Yes [provider]  traMADol (ULTRAM) 50 MG tablet Take 50 mg by mouth every 8 (eight) hours as needed for moderate pain.   Yes [provider]  VITAMIN D, CHOLECALCIFEROL, PO Take 400 Units by mouth daily.    Yes [provider]  vitamin E 400 UNIT capsule Take 400 Units by mouth 2 (two) times Imre Vecchione day.    Yes [provider]  cephALEXin (KEFLEX) 500 MG capsule Take 1 capsule (500 mg total) by mouth 3 (three) times daily. Patient not taking: Reported on 09/13/2018 06/28/18   Julianne Rice, MD  feeding supplement, ENSURE ENLIVE, (ENSURE ENLIVE) LIQD Take 237 mLs by mouth 3 (three) times daily between meals. Patient not taking: Reported on 07/27/2019 09/28/16   Arrien, Jimmy Picket, MD  predniSONE (STERAPRED UNI-PAK 21 TAB) 5 MG (21) TBPK tablet Take as instructed with food. 6,5,4,3,2,1 stop Patient not taking: Reported on 07/27/2019 02/20/19   Marybelle Killings, MD    Physical Exam: Vitals:   07/27/19 1319 07/27/19 1552 07/27/19 1758  BP: (!) 95/40 (!) 135/57 125/60  Pulse: 71 85 82  Resp: (!) _0 Temp: (!) 100.8 F (38.2 C)    TempSrc: Rectal    SpO2: 94% 93% 95%  Weight:  48.5 kg   Height:  _1  (1.575 m)     Constitutional: NAD, calm, comfortable Vitals:   07/27/19 1319 07/27/19 1552 07/27/19 1758  BP: (!) 95/40 (!) 135/57 125/60  Pulse: 71 85 82  Resp: (!) _2 Temp: (!) 100.8 F (38.2  C)    TempSrc: Rectal    SpO2: 94% 93% 95%  Weight:  48.5 kg   Height:  _3  (1.575 m)    Eyes: PERRL, lids and conjunctivae normal ENMT: Mucous membranes are moist. Posterior pharynx clear of any exudate or lesions.Normal dentition.  Neck: normal, supple, no masses, no thyromegaly Respiratory: clear to auscultation bilaterally, no wheezing, no crackles. Cardiovascular: Regular rate and rhythm, no murmurs / rubs / gallops. No extremity edema.  Abdomen: no rebound or guarding, ttp diffusely, more on L  and lower abdomen Musculoskeletal: no clubbing / cyanosis. No joint deformity upper and lower extremities. Good ROM, no contractures. Normal muscle tone.  Skin: no rashes, lesions, ulcers. No induration Neurologic: CN 2-12 grossly intact. Sensation intact. Moving all extremities.  Psychiatric: Normal judgment and insight. Alert and oriented x 3. Normal mood.   Labs on Admission: I have personally reviewed following labs and imaging studies  CBC: Recent Labs  Lab 07/27/19 1414  WBC 17.3*  NEUTROABS 13.5*  HGB 11.4*  HCT 36.1  MCV 89.8  PLT 144*    Basic Metabolic Panel: Recent Labs  Lab 07/27/19 1414  NA 139  K 4.0  CL 112*  CO2 17*  GLUCOSE 129*  BUN 45*  CREATININE 1.77*  CALCIUM 8.8*    GFR: Estimated Creatinine Clearance: 17.1 mL/min (Deanna [name]) (by C-G formula based on SCr of 1.77 mg/dL (H)).  Liver Function Tests: Recent Labs  Lab 07/27/19 1414  AST 23  ALT 15  ALKPHOS 43  BILITOT 0.7  PROT 6.8  ALBUMIN 3.3*    Urine analysis:    Component Value Date/Time   COLORURINE YELLOW 06/27/2018 1715   APPEARANCEUR HAZY (Kaysha Parsell) 06/27/2018 1715   LABSPEC 1.010 06/27/2018 1715   PHURINE 5.0 06/27/2018 [place] 06/27/2018 [place] 06/27/2018 [place] 06/27/2018 [place] 06/27/2018 [place] 06/27/2018 1715   UROBILINOGEN 0.2 08/07/2006 1818   NITRITE NEGATIVE 06/27/2018 1715    LEUKOCYTESUR SMALL (Deanna [name]) 06/27/2018 1715    Radiological Exams on Admission: CT ABDOMEN PELVIS WO CONTRAST  Result Date: 07/27/2019 CLINICAL DATA:  Six weeks of abdominal pain with vomiting and diarrhea. Weakness and possible fever. EXAM: CT ABDOMEN AND PELVIS WITHOUT CONTRAST TECHNIQUE: Multidetector CT imaging of the abdomen and pelvis was performed following the standard protocol without IV contrast. COMPARISON:  None. FINDINGS: Lower chest: Small amount right pleural fluid with mild bibasilar atelectatic change. Posterior bibasilar bronchiectatic change. Minimal calcified plaque over the descending thoracic aorta. Possible small hiatal hernia. Hepatobiliary: Minimal cholelithiasis. Liver and biliary tree are normal. Pancreas: Normal. Spleen: Normal. Adrenals/Urinary Tract: Adrenal glands are normal. Kidneys are normal in size without hydronephrosis or nephrolithiasis. Ureters are unremarkable. Bladder is somewhat distended. Stomach/Bowel: Possible small hiatal hernia. Small bowel is unremarkable. Previous appendectomy. Fluid-filled colon with possible mild wall thickening of the distal descending and sigmoid colon which may be seen with mild acute colitis. Vascular/Lymphatic: Moderate calcified plaque over the abdominal aorta which is normal in caliber. Few small mesenteric lymph nodes as described below. Reproductive: Uterus just left of midline and otherwise unremarkable. Adnexal regions unremarkable. Other: There is Deanna [name] masslike soft tissue density with associated coarse calcification over the mid mesentery measuring approximately 3.1 x 4 cm. There are Deanna [name] few small adjacent mesenteric lymph nodes. Small amount of free fluid over the cul-de-sac. Musculoskeletal: No focal abnormality. Degenerative changes of the spine with disc disease at the L4-5 and L5-S1 levels. Significant disc disease at the T11-12 level. Possible subtle grade 1 anterolisthesis of L4 on L5 and of L5 on S1. IMPRESSION: 1. Fluid-filled  colon with mild wall thickening over the distal descending and sigmoid colon which may be due to an acute colitis of infectious or inflammatory nature. Small amount of associated free fluid in the pelvis. No obstruction. 2. Mass over the mid mesentery with associated coarse calcification measuring 3.1 x 4 cm. Few small associated mesenteric lymph nodes. Findings may be due to Deanna [name] neoplastic process  such as carcinoid tumor. 3.  Mild cholelithiasis. 4. Small right pleural effusion with bibasilar atelectasis as well as bibasilar bronchiectatic change. 5.  Aortic Atherosclerosis (ICD10-I70.0). Electronically Signed   By: Marin Olp M.D.   On: 07/27/2019 17:15   DG Chest Port 1 View  Result Date: 07/27/2019 CLINICAL DATA:  Hypotension. EXAM: PORTABLE CHEST 1 VIEW COMPARISON:  06/27/2018. FINDINGS: Mediastinum and hilar structures normal. Heart size normal. No focal infiltrate. No pleural effusion or pneumothorax. Surgical clips left axilla. Prior cervical spine fusion. Degenerative change and scoliosis thoracic spine. Surgical clips left axilla. IMPRESSION: No acute cardiopulmonary disease. Electronically Signed   By: Marcello Moores  Register   On: 07/27/2019 14:37   OCT, Retina - OU - Both Eyes  Result Date: 07/26/2019 Right Eye Quality was good. Scan locations included subfoveal. Central Foveal Thickness: 289. Progression has been stable. Findings include vitreomacular adhesion . Left Eye Quality was good. Scan locations included subfoveal. Central Foveal Thickness: 301. Progression has been stable. Findings include vitreomacular adhesion . Notes OU with minor vitreal macular adhesion.  No inner foveal distortion from the attachment sites in either eye.  However the inner foveal of the retina has micro-CME.  This is morphologically slightly different as compared to the visit December 2020.  This does suggest that this might be ongoing minor MAC-TEL.   EKG: Independently reviewed. Sinus,  LBBB  Assessment/Plan Active Problems:   Colitis  Sepsis 2/2 Acute Colitis  Diarrhea  History of Inflammatory Diarrhea: pt has followed with Dr. Earlean Shawl for several years.  Per care everywhere, positive stool lactoferrin and calprotectin and was started on treatment with mesalamine and probiotics.  I don't see imaging on care everywhere, she had positive calprotectin and lactoferrin in 2019.  Negative CRP, celiac disease, pancreatic elastase.  She presented with diarrhea over the last 6 weeks, worsening recently, with hypotension today.  Imaging with colitis and small amount of free fluid in pelvis.  Imaging concerning for infectious vs inflammatory colitis -> will treat empirically with abx. Septic with fever, tachypnea, hypotension, leukocytosis GI c/s, discussed with Dr. Collene Mares, appreciate assistance Continue antibiotics Follow fecal lactoferrin, GI path panel, and C diff Enteric precautions Continue IVF, s/p 2.5 L in boluses, normal lactic Blood cx pending Pain management  AKI on CKD IIIa  NAGMA:  Baseline creatinine 1.3-1.6 (2018-2020).  Creatinine 1.77 on presentation, likely 2/2 sepsis and hypotension.  NAGMA likely related to AKI.   Mass of mid mesentery: concerning for neoplastic process such as carcinoid.  Doubt this is related to above, carcinoid typically associated with secretory diarrhea.  No flushing, bronchospasm.   24 hr urine 5-IAA Will discuss with oncology tomorrow  Small R Pleural Effusion: follow   Type 2 Diabetes: SSI, follow A1c  Peripheral Neuropathy: continue lyrica  Depression: Wellbutrin, lexapro  Hypertension: hold home BP meds with hypotension on presentation (HCTZ, amlodipine-valsartan, coreg)  HLD: crestor  Overactive Bladder: myrbetriq  Hx Breast Cancer: follow outpatient   DVT prophylaxis: heparin Code Status:   DNR Family Communication:  Daughter at bedside  Disposition Plan:   Patient is from: home   Anticipated DC to:  home  Anticipated  DC date:  6/21  Anticipated DC barriers: Need for IV fluids and IV antibiotics  Consults called:  GI c/s, Dr. Collene Mares  Admission status:  inpatient  Severity of Illness: The appropriate patient status for this patient is INPATIENT. Inpatient status is judged to be reasonable and necessary in order to provide the required intensity of service to ensure  the patient's safety. The patient's presenting symptoms, physical exam findings, and initial radiographic and laboratory data in the context of their chronic comorbidities is felt to place them at high risk for further clinical deterioration. Furthermore, it is not anticipated that the patient will be medically stable for discharge from the hospital within 2 midnights of admission. The following factors support the patient status of inpatient.   " The patient's presenting symptoms include hypotension, diarrhea. " The worrisome physical exam findings include hypotension. " The initial radiographic and laboratory data are worrisome because of colitis. " The chronic co-morbidities include diabetes.   * I certify that at the point of admission it is my clinical judgment that the patient will require inpatient hospital care spanning beyond 2 midnights from the point of admission due to high intensity of service, high risk for further deterioration and high frequency of surveillance required.Fayrene Helper MD [facility]  How to contact the [facility] Attending or Consulting provider [place] or covering provider during after hours [place], for this patient?   1. Check the care team in [facility] and look for Javanna Patin) attending/consulting [facility] provider listed and b) the [facility] team listed 2. Log into [email or web address] and use [place]'s universal password to access. If you do not have the password, please contact the hospital operator. 3. Locate the [facility] provider you are looking for under [facility] and page to Caelan Atchley number that you can be directly  reached. 4. If you still have difficulty reaching the provider, please page the [facility] (Director on Call) for the Hospitalists listed on amion for assistance.  07/27/2019, 6:46 PM

## 2019-07-27 NOTE — ED Provider Notes (Signed)
[facility] DEPT Provider Note   CSN: [id number] Arrival date & time: 07/27/19  1248     History Chief Complaint  Patient presents with  . Hypotension  . Diarrhea    Deanna [name] is a 86 y.o. female.  Patient is an 86 year old female with a history of diabetes, hypertension, diabetes mellitus and prior breast cancer who is presenting today with complaint of generalized weakness and abdominal pain.  Patient reports over the last 6 weeks she has had intermittent episodes of watery stool that she is taking probiotic for.  She reports typically there are 4-5 episodes per day but not always every day.  Patient reports that she was doing okay until yesterday when she started having severe lower abdominal pain that has persisted all night long.  She did have some nausea and vomiting yesterday but is feeling more weak today.  She was going to her doctors to have an exam but she was too weak to get out of the car.  When EMS arrived patient's blood pressure was 70 systolic but she was still talking and able to give history.  She denies any urinary complaints and denies any shortness of breath, cough or fever.  She has no chest pain.  She does reporting taking her blood pressure medication this morning.  The history is provided by the patient and medical records.  Diarrhea Quality:  Watery Severity:  Severe Onset quality:  Gradual Associated symptoms: abdominal pain and vomiting   Associated symptoms: no fever and no myalgias   Associated symptoms comment:  Weakness      Past Medical History:  Diagnosis Date  . Arthritis    all over  . Cancer ([place])    breast  . Depression   . Diabetes mellitus without complication (HCC)    diet controlled  . Hypertension     Patient Active Problem List   Diagnosis Date Noted  . Type 2 macular telangiectasis, left 07/26/2019  . Retinal hemorrhage of left eye 07/26/2019  . Type 2 macular telangiectasis, right  07/26/2019  . Vitreomacular adhesion of right eye 07/26/2019  . Vitreomacular adhesion of left eye 07/26/2019  . Loss of appetite 09/23/2016  . AKI (acute kidney injury) ([place]) 09/23/2016  . Orthostasis 09/23/2016  . Hyponatremia 09/23/2016  . Leukocytosis 09/23/2016  . Normocytic anemia 09/23/2016  . Depression with anxiety 09/23/2016  . Diabetes mellitus type II, non insulin dependent ([place]) 09/23/2016  . Transaminasemia 09/23/2016  . Dehydration   . Elevated LFTs   . Chronic midline low back pain without sciatica 01/13/2016    Past Surgical History:  Procedure Laterality Date  . APPENDECTOMY     with lower back surgery  . BACK SURGERY     x2  . BREAST SURGERY Left 1995   complete left mastectomy  . CERVICAL SPINE SURGERY    . EYE SURGERY Bilateral    cataracts  . TONSILLECTOMY     at age 11     OB History   No obstetric history on file.     No family history on file.  Social History   Tobacco Use  . Smoking status: Never Smoker  . Smokeless tobacco: Never Used  Vaping Use  . Vaping Use: Never used  Substance Use Topics  . Alcohol use: Never  . Drug use: Never    Home Medications Prior to Admission medications   Medication Sig Start Date End Date Taking? Authorizing Provider  acetaminophen (TYLENOL) 500 MG tablet Take  500 mg by mouth 2 (two) times a day.    [provider]  ALPRAZolam Duanne Moron) 0.25 MG tablet Take 0.25 mg by mouth 2 (two) times daily as needed for anxiety.    [provider]  aspirin EC 81 MG tablet Take 81 mg by mouth at bedtime.     [provider]  carvedilol (COREG) 12.5 MG tablet Take 12.5 mg by mouth 2 (two) times daily with a meal.  01/07/16   [provider]  cephALEXin (KEFLEX) 500 MG capsule Take 1 capsule (500 mg total) by mouth 3 (three) times daily. Patient not taking: Reported on 09/13/2018 06/28/18   Julianne Rice, MD  Cyanocobalamin (VITAMIN B 12 PO) Take 1,000 mcg by mouth daily.     [provider]  cyclobenzaprine (FLEXERIL) 5 MG tablet Take 5 mg by mouth every 8 (eight) hours as needed for muscle spasms.    [provider]  denosumab (PROLIA) 60 MG/ML SOLN injection Inject 60 mg into the skin every 6 (six) months. Administer in upper arm, thigh, or abdomen    [provider]  escitalopram (LEXAPRO) 10 MG tablet Take 10 mg by mouth daily. 04/12/18   [provider]  feeding supplement, ENSURE ENLIVE, (ENSURE ENLIVE) LIQD Take 237 mLs by mouth 3 (three) times daily between meals. Patient taking differently: Take 237 mLs by mouth daily.  09/28/16   Arrien, Jimmy Picket, MD  Hyoscyamine Sulfate 0.375 MG TBCR Take 1 tablet by mouth every 12 (twelve) hours as needed.    [provider]  LYRICA 150 MG capsule Take 150 mg by mouth 2 (two) times daily.  11/03/15   [provider]  metFORMIN (GLUCOPHAGE-XR) 500 MG 24 hr tablet Take 500 mg by mouth 2 (two) times a day. 05/07/18   [provider]  Multiple Vitamins-Minerals (EYE VITAMINS PO) Take 1 tablet by mouth daily.    [provider]  MYRBETRIQ 50 MG TB24 tablet Take 50 mg by mouth at bedtime.  10/21/15   [provider]  predniSONE (STERAPRED UNI-PAK 21 TAB) 5 MG (21) TBPK tablet Take as instructed with food. 6,5,4,3,2,1 stop 02/20/19   Marybelle Killings, MD  rosuvastatin (CRESTOR) 10 MG tablet Take 10 mg by mouth at bedtime.     [provider]  VITAMIN D, CHOLECALCIFEROL, PO Take 2,000 Units by mouth 2 (two) times daily.     [provider]  vitamin E 400 UNIT capsule Take 400 Units by mouth 2 (two) times a day.     [provider]    Allergies    Codeine  Review of Systems   Review of Systems  Constitutional: Negative for fever.  Gastrointestinal: Positive for abdominal pain, diarrhea and vomiting.  Musculoskeletal: Negative for myalgias.  All other systems reviewed and are negative.   Physical Exam Updated Vital  Signs BP (!) 95/40 (BP Location: Right Arm)   Pulse 71   Temp (!) 100.8 F (38.2 C) (Rectal)   Resp (!) 30   SpO2 94%   Physical Exam Vitals and nursing note reviewed.  Constitutional:      General: She is not in acute distress.    Appearance: Normal appearance. She is well-developed and normal weight. She is ill-appearing.  HENT:     Head: Normocephalic and atraumatic.     Mouth/Throat:     Mouth: Mucous membranes are dry.  Eyes:     Pupils: Pupils are equal, round, and reactive to light.  Cardiovascular:  Rate and Rhythm: Normal rate and regular rhythm.     Pulses: Normal pulses.     Heart sounds: Normal heart sounds. No murmur heard.  No friction rub.  Pulmonary:     Effort: Pulmonary effort is normal.     Breath sounds: Normal breath sounds. No wheezing or rales.  Abdominal:     General: Bowel sounds are normal. There is distension.     Palpations: Abdomen is soft.     Tenderness: There is abdominal tenderness. There is guarding. There is no rebound.     Comments: Generalized tenderness but more localized in the left upper and left lower quadrant with guarding  Musculoskeletal:        General: No tenderness. Normal range of motion.     Cervical back: Normal range of motion and neck supple.     Right lower leg: No edema.     Left lower leg: No edema.     Comments: No edema  Skin:    General: Skin is warm and dry.     Capillary Refill: Capillary refill takes less than 2 seconds.     Coloration: Skin is pale.     Findings: No rash.  Neurological:     General: No focal deficit present.     Mental Status: She is oriented to person, place, and time. Mental status is at baseline. She is lethargic.     Cranial Nerves: No cranial nerve deficit.  Psychiatric:        Mood and Affect: Mood normal.        Behavior: Behavior normal.        Thought Content: Thought content normal.     ED Results / Procedures / Treatments   Labs (all labs ordered are listed, but only  abnormal results are displayed) Labs Reviewed  CBC WITH DIFFERENTIAL/PLATELET - Abnormal; Notable for the following components:      Result Value   WBC 17.3 (*)    Hemoglobin 11.4 (*)    Platelets 144 (*)    All other components within normal limits  COMPREHENSIVE METABOLIC PANEL - Abnormal; Notable for the following components:   Chloride 112 (*)    CO2 17 (*)    Glucose, Bld 129 (*)    BUN 45 (*)    Creatinine, Ser 1.77 (*)    Calcium 8.8 (*)    Albumin 3.3 (*)    GFR calc non Af Amer 25 (*)    GFR calc Af Amer 29 (*)    All other components within normal limits  CULTURE, BLOOD (ROUTINE X 2)  CULTURE, BLOOD (ROUTINE X 2)  URINE CULTURE  SARS CORONAVIRUS 2 BY RT PCR (HOSPITAL ORDER, [facility])  LIPASE, BLOOD  LACTIC ACID, PLASMA  APTT  PROTIME-INR  URINALYSIS, ROUTINE W REFLEX MICROSCOPIC    EKG EKG Interpretation  Date/Time:  Friday July 27 2019 13:24:46 EDT Ventricular Rate:  70 PR Interval:    QRS Duration: 130 QT Interval:  417 QTC Calculation: 450 R Axis:   18 Text Interpretation: Sinus rhythm Left bundle branch block No significant change since last tracing Confirmed by Blanchie Dessert ([id number]) on 07/27/2019 2:19:44 PM   Radiology DG Chest Port 1 View  Result Date: 07/27/2019 CLINICAL DATA:  Hypotension. EXAM: PORTABLE CHEST 1 VIEW COMPARISON:  06/27/2018. FINDINGS: Mediastinum and hilar structures normal. Heart size normal. No focal infiltrate. No pleural effusion or pneumothorax. Surgical clips left axilla. Prior cervical spine fusion. Degenerative change and scoliosis  thoracic spine. Surgical clips left axilla. IMPRESSION: No acute cardiopulmonary disease. Electronically Signed   By: Marcello Moores  Register   On: 07/27/2019 14:37   OCT, Retina - OU - Both Eyes  Result Date: 07/26/2019 Right Eye Quality was good. Scan locations included subfoveal. Central Foveal Thickness: 289. Progression has been stable. Findings include vitreomacular  adhesion . Left Eye Quality was good. Scan locations included subfoveal. Central Foveal Thickness: 301. Progression has been stable. Findings include vitreomacular adhesion . Notes OU with minor vitreal macular adhesion.  No inner foveal distortion from the attachment sites in either eye.  However the inner foveal of the retina has micro-CME.  This is morphologically slightly different as compared to the visit December 2020.  This does suggest that this might be ongoing minor MAC-TEL.   Procedures Procedures (including critical care time)  Medications Ordered in ED Medications - No data to display  ED Course  I have reviewed the triage vital signs and the nursing notes.  Pertinent labs & imaging results that were available during my care of the patient were reviewed by me and considered in my medical decision making (see chart for details).    MDM Rules/Calculators/A&P                          Elderly female presenting today with weakness, hypotension, abdominal pain and intermittent diarrhea.  The diarrhea has been more long-term over the last 6 weeks but abdominal pain started yesterday.  Patient has significant tenderness on exam with mild distention and guarding.  Blood pressure here is 97/40 750 mL of normal saline.  No reported fever or respiratory or cardiac symptoms.  Concern for perforation, diverticulitis also large mass palpated in the left concern for splenomegaly or other acute process.  Lower suspicion for obstruction, UTI, pneumonia.  Also concern for AKI as patient has not been tolerating orals.  Labs, imaging pending.  Patient given IV fluids but at this time does not feel that she needs nausea or pain medicine.  3:06 PM CBC shows leukocytosis of 17,000 with stable hemoglobin of 11, CMP with mild AKI with creatinine of 1.7 with last creatinine of 1.6 with normal sodium and potassium.  Lactate is normal and less than 2.  Patient was given 30/kg of fluid and code sepsis was  initiated due to his rectal temperature of 100.8.  Lipase and chest x-ray without acute findings.  Patient was covered for intra-abdominal pathology based on exam.  CT is pending.  Patient's blood pressure after 30/kg which is 1500 mL of fluid her blood pressure has improved to 111/50.  5:31 PM CT shows fluid filled colon with wall thickening consistent with acute colitis and fits the pt's sx but also signs of 3x4cm mass with associated nodes concering for carcinoid tumor which may be what has been causing her diarrhea.  Pt's BP has improved but now requesting pain meds.  Will admit for further care.  Will also check c.diff  MDM Number of Diagnoses or Management Options   Amount and/or Complexity of Data Reviewed Clinical lab tests: ordered and reviewed Tests in the radiology section of CPT: ordered and reviewed Tests in the medicine section of CPT: ordered and reviewed Decide to obtain previous medical records or to obtain history from someone other than the patient: yes Obtain history from someone other than the patient: no Review and summarize past medical records: yes Discuss the patient with other providers: yes Independent visualization of images,  tracings, or specimens: yes  Risk of Complications, Morbidity, and/or Mortality Presenting problems: high Diagnostic procedures: low Management options: low  Patient Progress Patient progress: stable    Final Clinical Impression(s) / ED Diagnoses Final diagnoses:  Dehydration  Sepsis without acute organ dysfunction, due to unspecified organism [facility])  Colitis    Rx / DC Orders ED Discharge Orders    None       Blanchie Dessert, MD 07/27/19 1738

## 2019-07-28 LAB — COMPREHENSIVE METABOLIC PANEL
ALT: 12 U/L (ref 0–44)
AST: 17 U/L (ref 15–41)
Albumin: 3 g/dL — ABNORMAL LOW (ref 3.5–5.0)
Alkaline Phosphatase: 47 U/L (ref 38–126)
Anion gap: 6 (ref 5–15)
BUN: 38 mg/dL — ABNORMAL HIGH (ref 8–23)
CO2: 21 mmol/L — ABNORMAL LOW (ref 22–32)
Calcium: 8.8 mg/dL — ABNORMAL LOW (ref 8.9–10.3)
Chloride: 107 mmol/L (ref 98–111)
Creatinine, Ser: 1.39 mg/dL — ABNORMAL HIGH (ref 0.44–1.00)
GFR calc Af Amer: 39 mL/min — ABNORMAL LOW (ref >60)
GFR calc non Af Amer: 34 mL/min — ABNORMAL LOW (ref >60)
Glucose, Bld: 96 mg/dL (ref 70–99)
Potassium: 3.6 mmol/L (ref 3.5–5.1)
Sodium: 134 mmol/L — ABNORMAL LOW (ref 135–145)
Total Bilirubin: 0.7 mg/dL (ref 0.3–1.2)
Total Protein: 6.1 g/dL — ABNORMAL LOW (ref 6.5–8.1)

## 2019-07-28 LAB — URINALYSIS, ROUTINE W REFLEX MICROSCOPIC
Bacteria, UA: NONE SEEN
Bilirubin Urine: NEGATIVE
Glucose, UA: NEGATIVE mg/dL
Hgb urine dipstick: NEGATIVE
Ketones, ur: NEGATIVE mg/dL
Nitrite: NEGATIVE
Protein, ur: NEGATIVE mg/dL
Specific Gravity, Urine: 1.013 (ref 1.005–1.030)
WBC, UA: >50 WBC/hpf — ABNORMAL HIGH (ref 0–5)
pH: 5 (ref 5.0–8.0)

## 2019-07-28 LAB — CBC
HCT: 31.3 % — ABNORMAL LOW (ref 36.0–46.0)
Hemoglobin: 9.9 g/dL — ABNORMAL LOW (ref 12.0–15.0)
MCH: 28.3 pg (ref 26.0–34.0)
MCHC: 31.6 g/dL (ref 30.0–36.0)
MCV: 89.4 fL (ref 80.0–100.0)
Platelets: 126 10*3/uL — ABNORMAL LOW (ref 150–400)
RBC: 3.5 MIL/uL — ABNORMAL LOW (ref 3.87–5.11)
RDW: 15 % (ref 11.5–15.5)
WBC: 14.8 10*3/uL — ABNORMAL HIGH (ref 4.0–10.5)
nRBC: 0 % (ref 0.0–0.2)

## 2019-07-28 LAB — GLUCOSE, CAPILLARY
Glucose-Capillary: 86 mg/dL (ref 70–99)
Glucose-Capillary: 91 mg/dL (ref 70–99)
Glucose-Capillary: 82 mg/dL (ref 70–99)
Glucose-Capillary: 146 mg/dL — ABNORMAL HIGH (ref 70–99)

## 2019-07-28 LAB — LACTOFERRIN, FECAL, QUALITATIVE: Lactoferrin, Fecal, Qual: POSITIVE — AB

## 2019-07-28 LAB — GASTROINTESTINAL PANEL BY PCR, STOOL (REPLACES STOOL CULTURE)

## 2019-07-28 LAB — C DIFFICILE QUICK SCREEN W PCR REFLEX
C Diff antigen: NEGATIVE
C Diff interpretation: NOT DETECTED
C Diff toxin: NEGATIVE

## 2019-07-28 MED ORDER — PIPERACILLIN-TAZOBACTAM 3.375 G IVPB
3.3750 g | Freq: Three times a day (TID) | INTRAVENOUS | Status: DC
  Administered 2019-07-28 – 2019-08-01 (×13): 3.375 g via INTRAVENOUS
  Filled 2019-07-28 (×13): qty 50

## 2019-07-28 MED ORDER — METHYLPREDNISOLONE SODIUM SUCC 40 MG IJ SOLR
40.0000 mg | Freq: Every day | INTRAMUSCULAR | Status: DC
  Administered 2019-07-28 – 2019-08-02 (×6): 40 mg via INTRAVENOUS
  Filled 2019-07-28 (×6): qty 1

## 2019-07-28 MED ORDER — CHLORHEXIDINE GLUCONATE CLOTH 2 % EX PADS
6.0000 | MEDICATED_PAD | Freq: Every day | CUTANEOUS | Status: DC
  Administered 2019-07-28 – 2019-08-06 (×9): 6 via TOPICAL

## 2019-07-28 NOTE — Plan of Care (Signed)
  Problem: Education: Goal: Knowledge of General Education information will improve Description: Including pain rating scale, medication(s)/side effects and non-pharmacologic comfort measures Outcome: Progressing   Problem: Clinical Measurements: Goal: Ability to maintain clinical measurements within normal limits will improve Outcome: Progressing Goal: Will remain free from infection Outcome: Progressing Goal: Respiratory complications will improve Outcome: Completed/Met Goal: Cardiovascular complication will be avoided Outcome: Progressing

## 2019-07-28 NOTE — Progress Notes (Addendum)
Patient with periods of urinary and bowel incontinents, noted with distended abdomen tender to touch. Post void bladder scan with >865ml. Provider on call notified. I & O cath X1 at 0230 with 1030ml out. Pt reported she was feeling a little better. Bladder scan at 0630 with bladder vol of 340ml; provider on call notified awaiting further instructions/order.    In and out cath at 0655 with 337ml out

## 2019-07-28 NOTE — Progress Notes (Signed)
Pharmacy Antibiotic Note  Deanna [name] is a 86 y.o. female admitted on 07/27/2019 with IAI.  Pharmacy has been consulted for Zosyn dosing. 07/28/2019 WBC 17.3>14.8, SCr 1.77> 1.39 AF  Plan: Change to Zosyn 3.375 gm IV q8h infuse each dose over 4 hours Pharmacy to sign off  Height: 5\' 2"  (157.5 cm) Weight: 50.2 kg (110 lb 11.2 oz) IBW/kg (Calculated) : 50.1  Temp (24hrs), Avg:98.8 F (37.1 C), Min:97.6 F (36.4 C), Max:100.8 F (38.2 C)  Recent Labs  Lab 07/27/19 1414 07/28/19 0624  WBC 17.3* 14.8*  CREATININE 1.77* 1.39*  LATICACIDVEN 1.9  --     Estimated Creatinine Clearance: 22.6 mL/min (A) (by C-G formula based on SCr of 1.39 mg/dL (H)).    Allergies  Allergen Reactions  . Codeine Nausea Only     Thank you for allowing pharmacy to be a part of this patient's care.  Eudelia Bunch, Pharm.D 07/28/2019 9:58 AM

## 2019-07-28 NOTE — Consult Note (Signed)
UNASSIGNED PATIENT Reason for Consult: Colitis. Referring Physician: Joya Gaskins hospitalist-Dr. Gennette Pac, Deanna [name] is an 86 y.o. female.  HPI: [name], is a 86 year old white female with multiple medical problems listed below, who is routinely followed by Dr. Richmond Campbell for her GI care, presented to [facility] with worsening diarrhea over the last 6 weeks.  The day prior to admission she started having abdominal cramping with nausea and vomiting.  She claims a few years ago she was diagnosed with "inflammatory colitis" and was started on mesalamine but cannot say for sure whether she has UC or Crohn's disease. She denied having any fevers or chills chest pain or shortness of breath. There is no history of recent antibiotic use. She reportedly has been compliant with taking her Mesalamine.  Patient developed some acute urinary retention since admission and had to have an I & O cath for symptomatic relief.  She claims she does not feel well this morning because of her abdominal pain caused by urinary retention but denies any melena or hematochezia at this time.  Prior to admission she is used Imodium for symptomatic relief. C. difficile antigen is negative.  CT scan on admission revealed a fluid-filled colon mild wall thickening of the distal descending and sigmoid colon question acute colitis infectious versus inflammatory.  There was some free fluid in the pelvis no evidence of obstruction there was a mass noted in the mid mesentery is associated with coarse calcifications measuring 3.1 x 4 cm along with a few small mesenteric lymph nodes; according to the radiologist this could represent a neoplastic process like carcinoid; she was also noted to have cholelithiasis with a small right pleural effusion and bibasilar atelectasis as well as bibasilar bronchiectatic changes; aortic atherosclerosis was also noted.  Her GI stool panel is negative with no evidence of any  infectious etiology to her diarrhea and her lactoferrin is positive.  After reviewing the patient's records in care everywhere there is no evidence of the patient's had colonoscopy with biopsies to confirm the diagnosis of colitis.  Past Medical History:  Diagnosis Date  . Arthritis    GERD/H pylori gastritis  .  Breast cancer ([place])    History of pancreas divisum  . Depression   . Diabetes mellitus without complication ([place])    History of hemorrhoids with 6 hemorrhoidal band bands placed in the past  . Hypertension    Past Surgical History:  Procedure Laterality Date  . APPENDECTOMY     with lower back surgery  . BACK SURGERY     x2  . BREAST SURGERY Left 1995   complete left mastectomy  . CERVICAL SPINE SURGERY    . EYE SURGERY Bilateral    cataracts  . TONSILLECTOMY     at age 86   No family history on file.  Social History:  reports that she has never smoked. She has never used smokeless tobacco. She reports that she does not drink alcohol and does not use drugs.  Allergies:  Allergies  Allergen Reactions  . Codeine Nausea Only   Medications: I have reviewed the patient's current medications.  Results for orders placed or performed during the hospital encounter of 07/27/19 (from the past 48 hour(s))  Urinalysis, Routine w reflex microscopic     Status: Abnormal   Collection Time: 07/27/19  1:45 AM  Result Value Ref [place]   Color, Urine YELLOW YELLOW   APPearance CLEAR CLEAR   Specific Gravity, Urine 1.013 1.005 -  1.030   pH 5.0 5.0 - 8.0   Glucose, UA NEGATIVE NEGATIVE mg/dL   Hgb urine dipstick NEGATIVE NEGATIVE   Bilirubin Urine NEGATIVE NEGATIVE   Ketones, ur NEGATIVE NEGATIVE mg/dL   Protein, ur NEGATIVE NEGATIVE mg/dL   Nitrite NEGATIVE NEGATIVE   Leukocytes,Ua LARGE (A) NEGATIVE   RBC / HPF 0-5 0 - 5 RBC/hpf   WBC, UA >50 (H) 0 - 5 WBC/hpf   Bacteria, UA NONE SEEN NONE SEEN   Mucus PRESENT     Comment: Performed at [facility], [place] [address], [place], [place] [address]  Culture, blood (Routine X 2) w Reflex to ID Panel     Status: None (Preliminary result)   Collection Time: 07/27/19  1:24 PM   Specimen: BLOOD RIGHT HAND  Result Value Ref [place]   Specimen Description      BLOOD RIGHT HAND Performed at [facility], [place] [address] [place], [place] [address]    Special Requests      BOTTLES DRAWN AEROBIC AND ANAEROBIC Blood Culture adequate volume Performed at [place] [address], [place], [place] [address]    Culture PENDING    Report Status PENDING   Culture, blood (Routine X 2) w Reflex to ID Panel     Status: None (Preliminary result)   Collection Time: 07/27/19  1:29 PM   Specimen: BLOOD  Result Value Ref [place]   Specimen Description      BLOOD LEFT ANTECUBITAL Performed at [facility], [place] [address], [place], [place] [address]    Special Requests      BOTTLES DRAWN AEROBIC AND ANAEROBIC Blood Culture results may not be optimal due to an inadequate volume of blood received in culture bottles Performed at [place] [address], [place], [place] [address]    Culture PENDING    Report Status PENDING   CBC with Differential/Platelet     Status: Abnormal   Collection Time: 07/27/19  2:14 PM  Result Value Ref [place]   WBC 17.3 (H) 4.0 - 10.5 K/uL   RBC 4.02 3.87 - 5.11 MIL/uL   Hemoglobin 11.4 (L) 12.0 - 15.0 g/dL   HCT 36.1 36 - 46 %   MCV 89.8 80.0 - 100.0 fL   MCH 28.4 26.0 - 34.0 pg   MCHC 31.6 30.0 - 36.0 g/dL   RDW 15.0 11.5 - 15.5 %   Platelets 144 (L) 150 - 400 K/uL   nRBC 0.0 0.0 - 0.2 %   Neutrophils Relative % 65 %   Lymphocytes Relative 18 %   Monocytes Relative 4 %   Eosinophils Relative 0 %   Basophils Relative 0 %   Band Neutrophils 13 %   Metamyelocytes Relative 0 %   Myelocytes 0 %   Promyelocytes Relative 0 %   Blasts 0 %   nRBC 0 0 /100 WBC   Other 0 %   Neutro Abs 13.5 (H) 1.7 - 7.7 K/uL   Lymphs Abs 3.1  0.7 - 4.0 K/uL   Monocytes Absolute 0.7 0 - 1 K/uL   Eosinophils Absolute 0.0 0 - 0 K/uL   Basophils Absolute 0.0 0 - 0 K/uL   Abs Immature Granulocytes 0.00 0.00 - 0.07 K/uL   WBC Morphology VACUOLATED NEUTROPHILS     Comment: ATYPICAL LYMPHOCYTES PRESENT Performed at [place] [address] [place], [place] [address]   Comprehensive metabolic panel  Status: Abnormal   Collection Time: 07/27/19  2:14 PM  Result Value Ref [place]   Sodium 139 135 - 145 mmol/L   Potassium 4.0 3.5 - 5.1 mmol/L   Chloride 112 (H) 98 - 111 mmol/L   CO2 17 (L) 22 - 32 mmol/L   Glucose, Bld 129 (H) 70 - 99 mg/dL    Comment: Glucose reference range applies only to samples taken after fasting for at least 8 hours.   BUN 45 (H) 8 - 23 mg/dL   Creatinine, Ser 1.77 (H) 0.44 - 1.00 mg/dL   Calcium 8.8 (L) 8.9 - 10.3 mg/dL   Total Protein 6.8 6.5 - 8.1 g/dL   Albumin 3.3 (L) 3.5 - 5.0 g/dL   AST 23 15 - 41 U/L   ALT 15 0 - 44 U/L   Alkaline Phosphatase 43 38 - 126 U/L   Total Bilirubin 0.7 0.3 - 1.2 mg/dL   GFR calc non Af Amer 25 (L) >60 mL/min   GFR calc Af Amer 29 (L) >60 mL/min   Anion gap 10 5 - 15    Comment: Performed at [facility], [place] [address] [place], [place] [address]  Lipase, blood     Status: None   Collection Time: 07/27/19  2:14 PM  Result Value Ref [place]   Lipase 17 11 - 51 U/L    Comment: Performed at [facility], [place] [address], [place], [place] [address]  Lactic acid, plasma     Status: None   Collection Time: 07/27/19  2:14 PM  Result Value Ref [place]   Lactic Acid, Venous 1.9 0.5 - 1.9 mmol/L    Comment: Performed at [facility], [place] [address] [place], [place] [address]  APTT     Status: None   Collection Time: 07/27/19  2:14 PM  Result Value Ref [place]   aPTT 35 24 - 36 seconds    Comment: Performed at [facility], [place] [address], [place], [place] [address]  Protime-INR      Status: None   Collection Time: 07/27/19  2:14 PM  Result Value Ref [place]   Prothrombin Time 14.3 11.4 - 15.2 seconds   INR 1.2 0.8 - 1.2    Comment: (NOTE) INR goal varies based on device and disease states. Performed at [facility], [place] [address], [place], [place] [address]   SARS Coronavirus 2 by RT PCR (hospital order, performed in [facility] hospital lab) Nasopharyngeal Nasopharyngeal Swab     Status: None   Collection Time: 07/27/19  4:21 PM   Specimen: Nasopharyngeal Swab  Result Value Ref [place]   SARS Coronavirus 2 NEGATIVE NEGATIVE    Comment: (NOTE) SARS-CoV-2 target nucleic acids are NOT DETECTED.  The SARS-CoV-2 RNA is generally detectable in upper and lower respiratory specimens during the acute phase of infection. The lowest concentration of SARS-CoV-2 viral copies this assay can detect is 250 copies / mL. A negative result does not preclude SARS-CoV-2 infection and should not be used as the sole basis for treatment or other patient management decisions.  A negative result may occur with improper specimen collection / handling, submission of specimen other than nasopharyngeal swab, presence of viral mutation(s) within the areas targeted by this assay, and inadequate number of viral copies (<250 copies / mL). A negative result must be combined with clinical observations, patient history, and epidemiological information.  Fact Sheet for Patients:   StrictlyIdeas.no  Fact Sheet for Healthcare  Providers: BankingDealers.co.za  This test is not yet approved or  cleared by the [place] and has been authorized for detection and/or diagnosis of SARS-CoV-2 by FDA under an Emergency Use Authorization (EUA).  This EUA will remain in effect (meaning this test can be used) for the duration of the COVID-19 declaration under Section 564(b)(1) of the Act, 21 U.S.C. section 360bbb-3(b)(1), unless the  authorization is terminated or revoked sooner.  Performed at [facility], [place] [address] [place], [place] [address]   C Difficile Quick Screen w PCR reflex     Status: None   Collection Time: 07/27/19  5:38 PM   Specimen: STOOL  Result Value Ref [place]   C Diff antigen NEGATIVE NEGATIVE   C Diff toxin NEGATIVE NEGATIVE   C Diff interpretation No C. difficile detected.     Comment: VALID Performed at [facility], [place] [address], [place], [place] [address]   Glucose, capillary     Status: None   Collection Time: 07/27/19  9:07 PM  Result Value Ref Range   Glucose-Capillary 88 70 - 99 mg/dL    Comment: Glucose reference range applies only to samples taken after fasting for at least 8 hours.  Comprehensive metabolic panel     Status: Abnormal   Collection Time: 07/28/19  6:24 AM  Result Value Ref [place]   Sodium 134 (L) 135 - 145 mmol/L   Potassium 3.6 3.5 - 5.1 mmol/L   Chloride 107 98 - 111 mmol/L   CO2 21 (L) 22 - 32 mmol/L   Glucose, Bld 96 70 - 99 mg/dL    Comment: Glucose reference range applies only to samples taken after fasting for at least 8 hours.   BUN 38 (H) 8 - 23 mg/dL   Creatinine, Ser 1.39 (H) 0.44 - 1.00 mg/dL   Calcium 8.8 (L) 8.9 - 10.3 mg/dL   Total Protein 6.1 (L) 6.5 - 8.1 g/dL   Albumin 3.0 (L) 3.5 - 5.0 g/dL   AST 17 15 - 41 U/L   ALT 12 0 - 44 U/L   Alkaline Phosphatase 47 38 - 126 U/L   Total Bilirubin 0.7 0.3 - 1.2 mg/dL   GFR calc non Af Amer 34 (L) >60 mL/min   GFR calc Af Amer 39 (L) >60 mL/min   Anion gap 6 5 - 15    Comment: Performed at [facility], [place] [address] [place], [place] [address]  CBC     Status: Abnormal   Collection Time: 07/28/19  6:24 AM  Result Value Ref [place]   WBC 14.8 (H) 4.0 - 10.5 K/uL   RBC 3.50 (L) 3.87 - 5.11 MIL/uL   Hemoglobin 9.9 (L) 12.0 - 15.0 g/dL   HCT 31.3 (L) 36 - 46 %   MCV 89.4 80.0 - 100.0 fL   MCH 28.3 26.0 - 34.0 pg   MCHC 31.6 30.0 - 36.0  g/dL   RDW 15.0 11.5 - 15.5 %   Platelets 126 (L) 150 - 400 K/uL   nRBC 0.0 0.0 - 0.2 %    Comment: Performed at [facility], [place] [address], [place], [place] [address]  Glucose, capillary     Status: None   Collection Time: 07/28/19  8:17 AM  Result Value Ref Range   Glucose-Capillary 86 70 - 99 mg/dL    Comment: Glucose reference range applies only to samples taken after fasting for at least 8 hours.  CT ABDOMEN PELVIS WO CONTRAST  Result Date: 07/27/2019 CLINICAL DATA:  Six weeks of abdominal pain with vomiting and diarrhea. Weakness and possible fever. EXAM: CT ABDOMEN AND PELVIS WITHOUT CONTRAST TECHNIQUE: Multidetector CT imaging of the abdomen and pelvis was performed following the standard protocol without IV contrast. COMPARISON:  None. FINDINGS: Lower chest: Small amount right pleural fluid with mild bibasilar atelectatic change. Posterior bibasilar bronchiectatic change. Minimal calcified plaque over the descending thoracic aorta. Possible small hiatal hernia. Hepatobiliary: Minimal cholelithiasis. Liver and biliary tree are normal. Pancreas: Normal. Spleen: Normal. Adrenals/Urinary Tract: Adrenal glands are normal. Kidneys are normal in size without hydronephrosis or nephrolithiasis. Ureters are unremarkable. Bladder is somewhat distended. Stomach/Bowel: Possible small hiatal hernia. Small bowel is unremarkable. Previous appendectomy. Fluid-filled colon with possible mild wall thickening of the distal descending and sigmoid colon which may be seen with mild acute colitis. Vascular/Lymphatic: Moderate calcified plaque over the abdominal aorta which is normal in caliber. Few small mesenteric lymph nodes as described below. Reproductive: Uterus just left of midline and otherwise unremarkable. Adnexal regions unremarkable. Other: There is a masslike soft tissue density with associated coarse calcification over the mid mesentery measuring approximately 3.1 x 4 cm. There are  a few small adjacent mesenteric lymph nodes. Small amount of free fluid over the cul-de-sac. Musculoskeletal: No focal abnormality. Degenerative changes of the spine with disc disease at the L4-5 and L5-S1 levels. Significant disc disease at the T11-12 level. Possible subtle grade 1 anterolisthesis of L4 on L5 and of L5 on S1. IMPRESSION: 1. Fluid-filled colon with mild wall thickening over the distal descending and sigmoid colon which may be due to an acute colitis of infectious or inflammatory nature. Small amount of associated free fluid in the pelvis. No obstruction. 2. Mass over the mid mesentery with associated coarse calcification measuring 3.1 x 4 cm. Few small associated mesenteric lymph nodes. Findings may be due to a neoplastic process such as carcinoid tumor. 3.  Mild cholelithiasis. 4. Small right pleural effusion with bibasilar atelectasis as well as bibasilar bronchiectatic change. 5.  Aortic Atherosclerosis (ICD10-I70.0). Electronically Signed   By: Marin Olp M.D.   On: 07/27/2019 17:15   DG Chest Port 1 View  Result Date: 07/27/2019 CLINICAL DATA:  Hypotension. EXAM: PORTABLE CHEST 1 VIEW COMPARISON:  06/27/2018. FINDINGS: Mediastinum and hilar structures normal. Heart size normal. No focal infiltrate. No pleural effusion or pneumothorax. Surgical clips left axilla. Prior cervical spine fusion. Degenerative change and scoliosis thoracic spine. Surgical clips left axilla. IMPRESSION: No acute cardiopulmonary disease. Electronically Signed   By: Marcello Moores  Register   On: 07/27/2019 14:37   OCT, Retina - OU - Both Eyes  Result Date: 07/26/2019 Right Eye Quality was good. Scan locations included subfoveal. Central Foveal Thickness: 289. Progression has been stable. Findings include vitreomacular adhesion . Left Eye Quality was good. Scan locations included subfoveal. Central Foveal Thickness: 301. Progression has been stable. Findings include vitreomacular adhesion . Notes OU with minor vitreal  macular adhesion.  No inner foveal distortion from the attachment sites in either eye.  However the inner foveal of the retina has micro-CME.  This is morphologically slightly different as compared to the visit December 2020.  This does suggest that this might be ongoing minor MAC-TEL.  ROS:  As stated above in the HPI otherwise negative.  Blood pressure (!) 115/48, pulse 80, temperature 98.9 F (37.2 C), temperature source Oral, resp. rate 18, height _0  (1.575 m), weight 50.2 kg, SpO2 100 %.  PE:  Elderly white female somewhat uncomfortable due to the abdominal pain. Gen: NAD, Alert and Oriented HEENT:  [place]/AT, EOMI Neck: Supple, no LAD Lungs: CTA Bilaterally CV: RRR without M/G/R ABM: Soft, tender on palpation with no guarding, rebound or rigidity +BS Ext: No cyanosis or clubbing  Assessment/Plan: 1) Acute colitis flare-my suspicion is the patient has ulcerative colitis as she has been maintained on mesalamine. Now that her C. difficile antigen is negative and and her stool panel for GI infections is negative, she would probably benefit from a steroid taper. 2) Acute kidney injury on chronic kidney disease. 3) Mass in the mesentery ?carcinoid. 4) Hypertension/hyperlipidemia. 5) Overactive bladder with recent acute urinary retention 6) Depression on Wellbutrin Lexapro. 7) Peripheral neuropathy on Lyrica. 8) AODM-SSC. 9) History of breast cancer. 10) History of pancreas divisum as per her records from Dr. Liliane Channel office. 11) History of GERD/H. pylori gastritis. 12) DNR.  Nelwyn Salisbury, MD 07/28/2019.9:08 AM

## 2019-07-28 NOTE — Progress Notes (Signed)
PROGRESS NOTE    Deanna [name]  FBP:[id number] DOB: 30-Sep-1930 DOA: 07/27/2019 PCP: Marton Redwood, MD   Chief Complaint  Patient presents with  . Hypotension  . Diarrhea    Brief Narrative:  Deanna [name] is Deanna [name] 86 y.o. female with medical history significant of T2DM, HTN, depression, breast cancer, inflammatory diarrhea who presents with worsening symptoms of diarrhea for the last 6 weeks.  She notes during this time she's had diarrhea, less than half of the days of the week.  She typically takes imodium which improves the diarrhea.  Described as watery diarrhea.  Yesterday, she started having severe lower abdominal pain, cramping, tender to touch and with movement.  Had nausea/vomiting yesterday as well.  She went to doctor, but was too weak to get out of car, BP in the 70's on EMS arrival.  She denies fevers, chills, flushing, nausea, shortness of breath.  Last week, people who lived in the same place as her had Deanna [name] stomach bug, but she wasn't around them.  No recent abx use.  No smoking.  Rare etoh.  Follows with Dr. Earlean Shawl outpatient.    ED Course: Labs, EKG, imaging, abx, IVF.  Hospitalist for admission.  Assessment & Plan:   Active Problems:   Colitis   Sepsis 2/2 Acute Colitis  Diarrhea  History of Inflammatory Diarrhea: pt has followed with Dr. Earlean Shawl for several years.  Per care everywhere, positive stool lactoferrin and calprotectin and was started on treatment with mesalamine and probiotics.  I don't see imaging on care everywhere, she had positive calprotectin and lactoferrin in 2019.  Negative CRP, celiac disease, pancreatic elastase.  She presented with diarrhea over the last 6 weeks, worsening recently, with hypotension today.  Imaging with colitis and small amount of free fluid in pelvis.  Imaging concerning for infectious vs inflammatory colitis. Being treated empirically with abx C diff negative, negative GI path panel Trial of steroids per GI, appreciate  assistance Septic with fever, tachypnea, hypotension, leukocytosis - sepsis physiology resolved GI c/s, discussed with Dr. Collene Mares, appreciate assistance Continue antibiotics fecal lactoferrin positive.  GI path panel and C diff negative. Improving leukocytosis, follow Enteric precautions Blood cx pending Pain management  AKI on CKD IIIa  NAGMA:  Baseline creatinine 1.3-1.6 (2018-2020).  Creatinine 1.77 on presentation, likely 2/2 sepsis and hypotension.  NAGMA likely related to AKI. Improved to 1.39 today with IVF Follow with IVF   Mass of mid mesentery: concerning for neoplastic process such as carcinoid.  Doubt this is related to above, carcinoid typically associated with secretory diarrhea.  No flushing, bronchospasm.   24 hr urine 5-IAA Will discuss with oncology   Anemia: follow iron, b12, folate, ferritin  Small R Pleural Effusion: follow   Type 2 Diabetes: SSI, follow A1c  Peripheral Neuropathy: continue lyrica  Depression: Wellbutrin, lexapro  Hypertension: hold home BP meds with hypotension on presentation (HCTZ, amlodipine-valsartan, coreg)  HLD: crestor  Overactive Bladder: myrbetriq  Hx Breast Cancer: follow outpatient   DVT prophylaxis: heparin Code Status: DNR Family Communication: daughter at bedside Disposition:   Status is: Inpatient  Remains inpatient appropriate because:Inpatient level of care appropriate due to severity of illness   Dispo: The patient is from: Home              Anticipated d/c is to: pending              Anticipated d/c date is: > 3 days  Patient currently is not medically stable to d/c.   Consultants:   GI  Procedures: none  Antimicrobials:  Anti-infectives (From admission, onward)   Start     Dose/Rate Route Frequency Ordered Stop   07/28/19 1200  piperacillin-tazobactam (ZOSYN) IVPB 3.375 g     Discontinue     3.375 g 12.5 mL/hr over 240 Minutes Intravenous Every 8 hours 07/28/19 1000      07/28/19 0000  piperacillin-tazobactam (ZOSYN) IVPB 2.25 g  Status:  Discontinued        2.25 g 100 mL/hr over 30 Minutes Intravenous Every 6 hours 07/27/19 1915 07/28/19 1000   07/27/19 1400  ceFEPIme (MAXIPIME) 2 g in sodium chloride 0.9 % 100 mL IVPB        2 g 200 mL/hr over 30 Minutes Intravenous  Once 07/27/19 1357 07/27/19 1622   07/27/19 1400  metroNIDAZOLE (FLAGYL) IVPB 500 mg        500 mg 100 mL/hr over 60 Minutes Intravenous  Once 07/27/19 1357 07/27/19 1629     Subjective: Continued abdominal pain, maybe Deanna [name] bit better today  Objective: Vitals:   07/28/19 0200 07/28/19 0422 07/28/19 0825 07/28/19 1522  BP:  (!) 146/88 (!) 115/48 (!) 108/47  Pulse:  96 80 73  Resp: 18   16  Temp:  99.2 F (37.3 C) 98.9 F (37.2 C) 98 F (36.7 C)  TempSrc:  Oral Oral   SpO2:  94% 100% 98%  Weight:      Height:        Intake/Output Summary (Last 24 hours) at 07/28/2019 1636 Last data filed at 07/28/2019 1400 Gross per 24 hour  Intake 1657.62 ml  Output 1325 ml  Net 332.62 ml   Filed Weights   07/27/19 1552 07/27/19 2027  Weight: 48.5 kg 50.2 kg    Examination:  General exam: Appears calm and comfortable  Respiratory system: Clear to auscultation. Respiratory effort normal. Cardiovascular system: S1 & S2 heard, RRR.  Gastrointestinal system: Abdomen is nondistended, soft and nontender Central nervous system: Alert and oriented. No focal neurological deficits. Extremities: no LEE Skin: No rashes, lesions or ulcers Psychiatry: Judgement and insight appear normal. Mood & affect appropriate.     Data Reviewed: I have personally reviewed following labs and imaging studies  CBC: Recent Labs  Lab 07/27/19 1414 07/28/19 0624  WBC 17.3* 14.8*  NEUTROABS 13.5*  --   HGB 11.4* 9.9*  HCT 36.1 31.3*  MCV 89.8 89.4  PLT 144* 126*    Basic Metabolic Panel: Recent Labs  Lab 07/27/19 1414 07/28/19 0624  NA 139 134*  K 4.0 3.6  CL 112* 107  CO2 17* 21*  GLUCOSE 129*  96  BUN 45* 38*  CREATININE 1.77* 1.39*  CALCIUM 8.8* 8.8*    GFR: Estimated Creatinine Clearance: 22.6 mL/min (Deanna [name]) (by C-G formula based on SCr of 1.39 mg/dL (H)).  Liver Function Tests: Recent Labs  Lab 07/27/19 1414 07/28/19 0624  AST 23 17  ALT 15 12  ALKPHOS 43 47  BILITOT 0.7 0.7  PROT 6.8 6.1*  ALBUMIN 3.3* 3.0*    CBG: Recent Labs  Lab 07/27/19 2107 07/28/19 0817 07/28/19 1141  GLUCAP 88 86 91     Recent Results (from the past 240 hour(s))  Culture, blood (Routine X 2) w Reflex to ID Panel     Status: None (Preliminary result)   Collection Time: 07/27/19  1:24 PM   Specimen: BLOOD RIGHT HAND  Result Value Ref Range Status  Specimen Description   Final    BLOOD RIGHT HAND Performed at [facility], [place] [address], [place], [place] [address]    Special Requests   Final    BOTTLES DRAWN AEROBIC AND ANAEROBIC Blood Culture adequate volume Performed at [facility] [address] [place], [place] [address]    Culture   Final    NO GROWTH < 24 HOURS Performed at [facility] [address], [place], [place] [address]    Report Status PENDING  Incomplete  Culture, blood (Routine X 2) w Reflex to ID Panel     Status: None (Preliminary result)   Collection Time: 07/27/19  1:29 PM   Specimen: BLOOD  Result Value Ref Range Status   Specimen Description   Final    BLOOD LEFT ANTECUBITAL Performed at [facility] [address], [place], [place] [address]    Special Requests   Final    BOTTLES DRAWN AEROBIC AND ANAEROBIC Blood Culture results may not be optimal due to an inadequate volume of blood received in culture bottles   Culture   Final    NO GROWTH < 24 HOURS Performed at [facility] [address] [place], [place] [address]    Report Status PENDING  Incomplete  SARS Coronavirus 2 by RT PCR (hospital order, performed in [facility] hospital lab) Nasopharyngeal Nasopharyngeal Swab      Status: None   Collection Time: 07/27/19  4:21 PM   Specimen: Nasopharyngeal Swab  Result Value Ref Range Status   SARS Coronavirus 2 NEGATIVE NEGATIVE Final    Comment: (NOTE) SARS-CoV-2 target nucleic acids are NOT DETECTED.  The SARS-CoV-2 RNA is generally detectable in upper and lower respiratory specimens during the acute phase of infection. The lowest concentration of SARS-CoV-2 viral copies this assay can detect is 250 copies / mL. Naiara Lombardozzi negative result does not preclude SARS-CoV-2 infection and should not be used as the sole basis for treatment or other patient management decisions.  Jamesmichael Shadd negative result may occur with improper specimen collection / handling, submission of specimen other than nasopharyngeal swab, presence of viral mutation(s) within the areas targeted by this assay, and inadequate number of viral copies (<250 copies / mL). Altheia Shafran negative result must be combined with clinical observations, patient history, and epidemiological information.  Fact Sheet for Patients:   StrictlyIdeas.no  Fact Sheet for Healthcare Providers: BankingDealers.co.za  This test is not yet approved or  cleared by the [place] FDA and has been authorized for detection and/or diagnosis of SARS-CoV-2 by FDA under an Emergency Use Authorization (EUA).  This EUA will remain in effect (meaning this test can be used) for the duration of the COVID-19 declaration under Section 564(b)(1) of the Act, 21 U.S.C. section 360bbb-3(b)(1), unless the authorization is terminated or revoked sooner.  Performed at [facility], [place] [address] [place], [place] [address]   C Difficile Quick Screen w PCR reflex     Status: None   Collection Time: 07/27/19  5:38 PM   Specimen: STOOL  Result Value Ref Range Status   C Diff antigen NEGATIVE NEGATIVE Final   C Diff toxin NEGATIVE NEGATIVE Final   C Diff interpretation No C. difficile detected.   Final    Comment: VALID Performed at [facility], [place] [address] [place],  [address]   Gastrointestinal Panel by PCR , Stool     Status: None   Collection Time: 07/27/19  5:38 PM   Specimen: STOOL  Result Value Ref Range Status   Campylobacter species NOT DETECTED NOT DETECTED Final   Plesimonas shigelloides NOT DETECTED NOT DETECTED Final   Salmonella species NOT DETECTED NOT DETECTED Final   Yersinia enterocolitica NOT DETECTED NOT DETECTED Final   Vibrio species NOT DETECTED NOT DETECTED Final   Vibrio cholerae NOT DETECTED NOT DETECTED Final   Enteroaggregative E coli (EAEC) NOT DETECTED NOT DETECTED Final   Enteropathogenic E coli (EPEC) NOT DETECTED NOT DETECTED Final   Enterotoxigenic E coli (ETEC) NOT DETECTED NOT DETECTED Final   Shiga like toxin producing E coli (STEC) NOT DETECTED NOT DETECTED Final   Shigella/Enteroinvasive E coli (EIEC) NOT DETECTED NOT DETECTED Final   Cryptosporidium NOT DETECTED NOT DETECTED Final   Cyclospora cayetanensis NOT DETECTED NOT DETECTED Final   Entamoeba histolytica NOT DETECTED NOT DETECTED Final   Giardia lamblia NOT DETECTED NOT DETECTED Final   Adenovirus F40/41 NOT DETECTED NOT DETECTED Final   Astrovirus NOT DETECTED NOT DETECTED Final   Norovirus GI/GII NOT DETECTED NOT DETECTED Final   Rotavirus Kashius Dominic NOT DETECTED NOT DETECTED Final   Sapovirus (I, II, IV, and V) NOT DETECTED NOT DETECTED Final    Comment: Performed at [facility], [address], [place], [place] [address]         Radiology Studies: CT ABDOMEN PELVIS WO CONTRAST  Result Date: 07/27/2019 CLINICAL DATA:  Six weeks of abdominal pain with vomiting and diarrhea. Weakness and possible fever. EXAM: CT ABDOMEN AND PELVIS WITHOUT CONTRAST TECHNIQUE: Multidetector CT imaging of the abdomen and pelvis was performed following the standard protocol without IV contrast. COMPARISON:  None. FINDINGS: Lower chest: Small amount right pleural  fluid with mild bibasilar atelectatic change. Posterior bibasilar bronchiectatic change. Minimal calcified plaque over the descending thoracic aorta. Possible small hiatal hernia. Hepatobiliary: Minimal cholelithiasis. Liver and biliary tree are normal. Pancreas: Normal. Spleen: Normal. Adrenals/Urinary Tract: Adrenal glands are normal. Kidneys are normal in size without hydronephrosis or nephrolithiasis. Ureters are unremarkable. Bladder is somewhat distended. Stomach/Bowel: Possible small hiatal hernia. Small bowel is unremarkable. Previous appendectomy. Fluid-filled colon with possible mild wall thickening of the distal descending and sigmoid colon which may be seen with mild acute colitis. Vascular/Lymphatic: Moderate calcified plaque over the abdominal aorta which is normal in caliber. Few small mesenteric lymph nodes as described below. Reproductive: Uterus just left of midline and otherwise unremarkable. Adnexal regions unremarkable. Other: There is Ryelan Kazee masslike soft tissue density with associated coarse calcification over the mid mesentery measuring approximately 3.1 x 4 cm. There are Carolie Mcilrath few small adjacent mesenteric lymph nodes. Small amount of free fluid over the cul-de-sac. Musculoskeletal: No focal abnormality. Degenerative changes of the spine with disc disease at the L4-5 and L5-S1 levels. Significant disc disease at the T11-12 level. Possible subtle grade 1 anterolisthesis of L4 on L5 and of L5 on S1. IMPRESSION: 1. Fluid-filled colon with mild wall thickening over the distal descending and sigmoid colon which may be due to an acute colitis of infectious or inflammatory nature. Small amount of associated free fluid in the pelvis. No obstruction. 2. Mass over the mid mesentery with associated coarse calcification measuring 3.1 x 4 cm. Few small associated mesenteric lymph nodes. Findings may be due to Mande Auvil neoplastic process such as carcinoid tumor. 3.  Mild cholelithiasis. 4. Small right pleural effusion  with bibasilar atelectasis as well as bibasilar bronchiectatic change. 5.  Aortic Atherosclerosis (ICD10-I70.0). Electronically Signed   By: Marin Olp M.D.  On: 07/27/2019 17:15   DG Chest Port 1 View  Result Date: 07/27/2019 CLINICAL DATA:  Hypotension. EXAM: PORTABLE CHEST 1 VIEW COMPARISON:  06/27/2018. FINDINGS: Mediastinum and hilar structures normal. Heart size normal. No focal infiltrate. No pleural effusion or pneumothorax. Surgical clips left axilla. Prior cervical spine fusion. Degenerative change and scoliosis thoracic spine. Surgical clips left axilla. IMPRESSION: No acute cardiopulmonary disease. Electronically Signed   By: Marcello Moores  Register   On: 07/27/2019 14:37        Scheduled Meds: . buPROPion  150 mg Oral Daily  . Chlorhexidine Gluconate Cloth  6 each Topical Daily  . escitalopram  10 mg Oral Daily  . heparin  5,000 Units Subcutaneous Q8H  . insulin aspart  0-5 Units Subcutaneous QHS  . insulin aspart  0-9 Units Subcutaneous TID WC  . mesalamine  2.4 g Oral Q breakfast  . methylPREDNISolone (SOLU-MEDROL) injection  40 mg Intravenous Daily  . mirabegron ER  50 mg Oral QHS  . pantoprazole  40 mg Oral Daily  . pregabalin  75 mg Oral BID  . vitamin B-12  1,000 mcg Oral Daily   Continuous Infusions: . lactated ringers 75 mL/hr at 07/28/19 0203  . lactated ringers 75 mL/hr at 07/27/19 2038  . piperacillin-tazobactam (ZOSYN)  IV 3.375 g (07/28/19 1247)     LOS: 1 day    Time spent: over 71 min    Fayrene Helper, MD [facility]   To contact the attending provider between 7A-7P or the covering provider during after hours 7P-7A, please log into the web site [email or web address] and access using universal [place] password for that web site. If you do not have the password, please call the hospital operator.  07/28/2019, 4:36 PM

## 2019-07-29 LAB — COMPREHENSIVE METABOLIC PANEL
ALT: 14 U/L (ref 0–44)
AST: 18 U/L (ref 15–41)
Albumin: 3.2 g/dL — ABNORMAL LOW (ref 3.5–5.0)
Alkaline Phosphatase: 63 U/L (ref 38–126)
Anion gap: 5 (ref 5–15)
BUN: 27 mg/dL — ABNORMAL HIGH (ref 8–23)
CO2: 20 mmol/L — ABNORMAL LOW (ref 22–32)
Calcium: 9.4 mg/dL (ref 8.9–10.3)
Chloride: 111 mmol/L (ref 98–111)
Creatinine, Ser: 1.28 mg/dL — ABNORMAL HIGH (ref 0.44–1.00)
GFR calc Af Amer: 44 mL/min — ABNORMAL LOW (ref >60)
GFR calc non Af Amer: 38 mL/min — ABNORMAL LOW (ref >60)
Glucose, Bld: 195 mg/dL — ABNORMAL HIGH (ref 70–99)
Potassium: 3.4 mmol/L — ABNORMAL LOW (ref 3.5–5.1)
Sodium: 136 mmol/L (ref 135–145)
Total Bilirubin: 0.7 mg/dL (ref 0.3–1.2)
Total Protein: 7.1 g/dL (ref 6.5–8.1)

## 2019-07-29 LAB — CBC WITH DIFFERENTIAL/PLATELET
Abs Immature Granulocytes: 0.06 10*3/uL (ref 0.00–0.07)
Basophils Absolute: 0 10*3/uL (ref 0.0–0.1)
Basophils Relative: 0 %
Eosinophils Absolute: 0 10*3/uL (ref 0.0–0.5)
Eosinophils Relative: 0 %
HCT: 32.9 % — ABNORMAL LOW (ref 36.0–46.0)
Hemoglobin: 10.7 g/dL — ABNORMAL LOW (ref 12.0–15.0)
Immature Granulocytes: 1 %
Lymphocytes Relative: 17 %
Lymphs Abs: 1.7 10*3/uL (ref 0.7–4.0)
MCH: 28.1 pg (ref 26.0–34.0)
MCHC: 32.5 g/dL (ref 30.0–36.0)
MCV: 86.4 fL (ref 80.0–100.0)
Monocytes Absolute: 0.3 10*3/uL (ref 0.1–1.0)
Monocytes Relative: 3 %
Neutro Abs: 7.8 10*3/uL — ABNORMAL HIGH (ref 1.7–7.7)
Neutrophils Relative %: 79 %
Platelets: 125 10*3/uL — ABNORMAL LOW (ref 150–400)
RBC: 3.81 MIL/uL — ABNORMAL LOW (ref 3.87–5.11)
RDW: 14.7 % (ref 11.5–15.5)
WBC: 9.8 10*3/uL (ref 4.0–10.5)
nRBC: 0 % (ref 0.0–0.2)

## 2019-07-29 LAB — URINE CULTURE: Culture: NO GROWTH

## 2019-07-29 LAB — IRON AND TIBC
Iron: 16 ug/dL — ABNORMAL LOW (ref 28–170)
Saturation Ratios: 6 % — ABNORMAL LOW (ref 10.4–31.8)
TIBC: 274 ug/dL (ref 250–450)
UIBC: 258 ug/dL

## 2019-07-29 LAB — GLUCOSE, CAPILLARY
Glucose-Capillary: 163 mg/dL — ABNORMAL HIGH (ref 70–99)
Glucose-Capillary: 153 mg/dL — ABNORMAL HIGH (ref 70–99)
Glucose-Capillary: 236 mg/dL — ABNORMAL HIGH (ref 70–99)
Glucose-Capillary: 275 mg/dL — ABNORMAL HIGH (ref 70–99)

## 2019-07-29 LAB — MAGNESIUM: Magnesium: 1.7 mg/dL (ref 1.7–2.4)

## 2019-07-29 LAB — PHOSPHORUS: Phosphorus: 3.2 mg/dL (ref 2.5–4.6)

## 2019-07-29 LAB — FERRITIN: Ferritin: 93 ng/mL (ref 11–307)

## 2019-07-29 LAB — FOLATE: Folate: 11.7 ng/mL (ref >5.9)

## 2019-07-29 LAB — VITAMIN B12: Vitamin B-12: 3425 pg/mL — ABNORMAL HIGH (ref 180–914)

## 2019-07-29 MED ORDER — CARVEDILOL 12.5 MG PO TABS
12.5000 mg | ORAL_TABLET | Freq: Two times a day (BID) | ORAL | Status: DC
  Administered 2019-07-29 – 2019-07-31 (×4): 12.5 mg via ORAL
  Filled 2019-07-29 (×4): qty 1

## 2019-07-29 MED ORDER — LACTATED RINGERS IV SOLN: INTRAVENOUS | Status: DC

## 2019-07-29 MED ORDER — POTASSIUM CHLORIDE CRYS ER 20 MEQ PO TBCR
40.0000 meq | EXTENDED_RELEASE_TABLET | Freq: Once | ORAL | Status: AC
  Administered 2019-07-29: 40 meq via ORAL
  Filled 2019-07-29: qty 2

## 2019-07-29 MED ORDER — LABETALOL HCL 5 MG/ML IV SOLN
10.0000 mg | INTRAVENOUS | Status: DC | PRN
  Administered 2019-07-29 (×2): 10 mg via INTRAVENOUS
  Filled 2019-07-29 (×2): qty 4

## 2019-07-29 MED ORDER — AMLODIPINE BESYLATE 5 MG PO TABS
5.0000 mg | ORAL_TABLET | Freq: Every day | ORAL | Status: DC
  Administered 2019-07-29 – 2019-07-31 (×3): 5 mg via ORAL
  Filled 2019-07-29 (×3): qty 1

## 2019-07-29 NOTE — Progress Notes (Signed)
GASTROENTEROLOGY ROUNDING NOTE CROSS COVER [facility] Subjective: [name] is a 86 year old white female who is followed by Dr. Richmond Campbell for" inflammatory colitis".  Apparently she was treated with mesalamine for a fecal urgency and diarrhea and as the symptoms resolved on the combination of medication along with probiotic VSL 3 she has been maintained on these for the last 3 to 4 years.  We were unable to find any colonoscopy report or biopsy that was done to confirm this diagnosis.  She has done well from a GI standpoint since admission and has responded to steroid she has had 1 loose BM earlier today and then more formed BM this afternoon.  She rates her abdominal discomfort at a 2 out of 10 but is had some urinary retention which is being worked up.   Objective: Vital signs in last 24 hours: Temp:  [97.4 F (36.3 C)-99.2 F (37.3 C)] 97.4 F (36.3 C) (06/20 0554) Pulse Rate:  [73-89] 86 (06/20 0641) Resp:  [16] 16 (06/20 0554) BP: (108-176)/(47-84) 176/84 (06/20 0641) SpO2:  [93 %-98 %] 96 % (06/20 0554) Weight:  [48.8 kg] 48.8 kg (06/20 0500) Last BM Date: 07/29/19 General: NAD, patient seems more alert and perked up today and is interactive. Lungs: Clear to auscultation Heart: S1-S2 regular  Abdomen: Soft nontender with normal bowel sounds  Intake/Output from previous day: 06/19 0701 - 06/20 0700 In: 2069.1 [P.O.:360; I.V.:1609.1; IV Piggyback:100] Out: 1750 [Urine:1750] Intake/Output this shift: Total I/O In: 120 [P.O.:120] Out: -   Lab Results: Recent Labs    07/27/19 1414 07/28/19 0624 07/29/19 0606  WBC 17.3* 14.8* 9.8  HGB 11.4* 9.9* 10.7*  PLT 144* 126* 125*  MCV 89.8 89.4 86.4   BMET Recent Labs    07/27/19 1414 07/28/19 0624 07/29/19 0606  NA 139 134* 136  K 4.0 3.6 3.4*  CL 112* 107 111  CO2 17* 21* 20*  GLUCOSE 129* 96 195*  BUN 45* 38* 27*  CREATININE 1.77* 1.39* 1.28*  CALCIUM 8.8* 8.8* 9.4   LFT Recent Labs    07/27/19 1414  07/28/19 0624 07/29/19 0606  PROT 6.8 6.1* 7.1  ALBUMIN 3.3* 3.0* 3.2*  AST 23 17 18   ALT 15 12 14   ALKPHOS 43 47 63  BILITOT 0.7 0.7 0.7   PT/INR Recent Labs    07/27/19 1414  INR 1.2   Imaging/Other results: CT ABDOMEN PELVIS WO CONTRAST  Result Date: 07/27/2019 CLINICAL DATA:  Six weeks of abdominal pain with vomiting and diarrhea. Weakness and possible fever. EXAM: CT ABDOMEN AND PELVIS WITHOUT CONTRAST TECHNIQUE: Multidetector CT imaging of the abdomen and pelvis was performed following the standard protocol without IV contrast. COMPARISON:  None. FINDINGS: Lower chest: Small amount right pleural fluid with mild bibasilar atelectatic change. Posterior bibasilar bronchiectatic change. Minimal calcified plaque over the descending thoracic aorta. Possible small hiatal hernia. Hepatobiliary: Minimal cholelithiasis. Liver and biliary tree are normal. Pancreas: Normal. Spleen: Normal. Adrenals/Urinary Tract: Adrenal glands are normal. Kidneys are normal in size without hydronephrosis or nephrolithiasis. Ureters are unremarkable. Bladder is somewhat distended. Stomach/Bowel: Possible small hiatal hernia. Small bowel is unremarkable. Previous appendectomy. Fluid-filled colon with possible mild wall thickening of the distal descending and sigmoid colon which may be seen with mild acute colitis. Vascular/Lymphatic: Moderate calcified plaque over the abdominal aorta which is normal in caliber. Few small mesenteric lymph nodes as described below. Reproductive: Uterus just left of midline and otherwise unremarkable. Adnexal regions unremarkable. Other: There is a masslike soft tissue density  with associated coarse calcification over the mid mesentery measuring approximately 3.1 x 4 cm. There are a few small adjacent mesenteric lymph nodes. Small amount of free fluid over the cul-de-sac. Musculoskeletal: No focal abnormality. Degenerative changes of the spine with disc disease at the L4-5 and L5-S1 levels.  Significant disc disease at the T11-12 level. Possible subtle grade 1 anterolisthesis of L4 on L5 and of L5 on S1. IMPRESSION: 1. Fluid-filled colon with mild wall thickening over the distal descending and sigmoid colon which may be due to an acute colitis of infectious or inflammatory nature. Small amount of associated free fluid in the pelvis. No obstruction. 2. Mass over the mid mesentery with associated coarse calcification measuring 3.1 x 4 cm. Few small associated mesenteric lymph nodes. Findings may be due to a neoplastic process such as carcinoid tumor. 3.  Mild cholelithiasis. 4. Small right pleural effusion with bibasilar atelectasis as well as bibasilar bronchiectatic change. 5.  Aortic Atherosclerosis (ICD10-I70.0). Electronically Signed   By: Marin Olp M.D.   On: 07/27/2019 17:15   Assessment /Plan 1) Inflammatory colitis noted on CT scan-this seems to have responded well to steroids and mesalamine. Patient stool studies were negative for GI pathogens including C. Difficile. She was also noted to have a mass in the mesentery ?carcinoid as the patient is DNR I am not sure how much more work-up the patient desires at this point. I will defer this to Dr. Earlean Shawl to make a decision in the future post discharge. Patient to be taper of the steroids over the next couple of weeks and maintained on mesalamine till further recommendations from her primary gastroenterologist Dr. Richmond Campbell.  I have also advised her daughter Denice Paradise need to speak with Dr. Earlean Shawl about the mesenteric mass and decide if further evaluation is something they would like to pursue. I will sign off for now please call of our gastroenterology if any further questions or concerns. 2) History of overactive bladder/recent urinary retention/frequent UTIs. 3) HTN. 4) Depression. 5) Type II DM. 6) Personal history of breast cancer. 7) Cholelithiasis. [name], MD  07/29/2019, 2:19 PM

## 2019-07-29 NOTE — Progress Notes (Signed)
PROGRESS NOTE    Deanna [name]  QPY:[id number] DOB: 23-May-1930 DOA: 07/27/2019 PCP: Marton Redwood, MD   Chief Complaint  Patient presents with  . Hypotension  . Diarrhea    Brief Narrative:  Deanna [name] is Deanna [name] 86 y.o. female with medical history significant of T2DM, HTN, depression, breast cancer, inflammatory diarrhea who presents with worsening symptoms of diarrhea for the last 6 weeks.  She notes during this time she's had diarrhea, less than half of the days of the week.  She typically takes imodium which improves the diarrhea.  Described as watery diarrhea.  Yesterday, she started having severe lower abdominal pain, cramping, tender to touch and with movement.  Had nausea/vomiting yesterday as well.  She went to doctor, but was too weak to get out of car, BP in the 70's on EMS arrival.  She denies fevers, chills, flushing, nausea, shortness of breath.  Last week, people who lived in the same place as her had Toryn Mcclinton stomach bug, but she wasn't around them.  No recent abx use.  No smoking.  Rare etoh.  Follows with Dr. Earlean Shawl outpatient.    ED Course: Labs, EKG, imaging, abx, IVF.  Hospitalist for admission.  Assessment & Plan:   Active Problems:   Colitis   Sepsis 2/2 Acute Colitis  Diarrhea  History of Inflammatory Diarrhea: pt has followed with Dr. Earlean Shawl for several years.  Per care everywhere, positive stool lactoferrin and calprotectin and was started on treatment with mesalamine and probiotics.  I don't see imaging on care everywhere, she had positive calprotectin and lactoferrin in 2019.  Negative CRP, celiac disease, pancreatic elastase.  She presented with diarrhea over the last 6 weeks, worsening recently, with hypotension today.  Imaging with colitis and small amount of free fluid in pelvis.  Imaging concerning for infectious vs inflammatory colitis. Being treated empirically with abx C diff negative, negative GI path panel Trial of steroids per GI, appreciate  assistance Septic with fever, tachypnea, hypotension, leukocytosis - sepsis physiology resolved GI c/s, discussed with Dr. Collene Mares, appreciate assistance Continue antibiotics fecal lactoferrin positive.  GI path panel and C diff negative. Improving leukocytosis, follow Enteric precautions Blood cx pending Pain management  AKI on CKD IIIa  NAGMA:  Baseline creatinine 1.3-1.6 (2018-2020).  Creatinine 1.77 on presentation, likely 2/2 sepsis and hypotension.  NAGMA likely related to AKI. Improved to 1.39 today with IVF Follow with IVF   Mass of mid mesentery: concerning for neoplastic process such as carcinoid.  Doubt this is related to above, carcinoid typically associated with secretory diarrhea.  No flushing, bronchospasm.   24 hr urine 5-IAA Recommending outpatient oncology follow up  Iron Def Anemia: will need iron supplementation  Small R Pleural Effusion: follow   Type 2 Diabetes: SSI, follow A1c  Peripheral Neuropathy: continue lyrica  Depression: Wellbutrin, lexapro  Hypertension: resume amlodipine and coreg - hold valsartan and HCTZ  HLD: crestor  Overactive Bladder: myrbetriq  Hx Breast Cancer: follow outpatient   Hypokalemia: replace and follow   DVT prophylaxis: heparin Code Status: DNR Family Communication: daughter at bedside Disposition:   Status is: Inpatient  Remains inpatient appropriate because:Inpatient level of care appropriate due to severity of illness   Dispo: The patient is from: Home              Anticipated d/c is to: pending              Anticipated d/c date is: > 3 days  Patient currently is not medically stable to d/c.   Consultants:   GI  Procedures: none  Antimicrobials:  Anti-infectives (From admission, onward)   Start     Dose/Rate Route Frequency Ordered Stop   07/28/19 1200  piperacillin-tazobactam (ZOSYN) IVPB 3.375 g     Discontinue     3.375 g 12.5 mL/hr over 240 Minutes Intravenous Every 8 hours  07/28/19 1000     07/28/19 0000  piperacillin-tazobactam (ZOSYN) IVPB 2.25 g  Status:  Discontinued        2.25 g 100 mL/hr over 30 Minutes Intravenous Every 6 hours 07/27/19 1915 07/28/19 1000   07/27/19 1400  ceFEPIme (MAXIPIME) 2 g in sodium chloride 0.9 % 100 mL IVPB        2 g 200 mL/hr over 30 Minutes Intravenous  Once 07/27/19 1357 07/27/19 1622   07/27/19 1400  metroNIDAZOLE (FLAGYL) IVPB 500 mg        500 mg 100 mL/hr over 60 Minutes Intravenous  Once 07/27/19 1357 07/27/19 1629     Subjective: Feeling better, continued abdominal pain, but improved  Objective: Vitals:   07/29/19 0500 07/29/19 0554 07/29/19 0641 07/29/19 1517  BP:   (!) 176/84 (!) 180/85  Pulse:  89 86 83  Resp:  16  (!) 24  Temp:  (!) 97.4 F (36.3 C)  (!) 97.3 F (36.3 C)  TempSrc:  Oral    SpO2:  96%  96%  Weight: 48.8 kg     Height:        Intake/Output Summary (Last 24 hours) at 07/29/2019 1551 Last data filed at 07/29/2019 0900 Gross per 24 hour  Intake 531.5 ml  Output 1750 ml  Net -1218.5 ml   Filed Weights   07/27/19 1552 07/27/19 2027 07/29/19 0500  Weight: 48.5 kg 50.2 kg 48.8 kg    Examination:  General: No acute distress. Cardiovascular: Heart sounds show Daiton Cowles regular rate, and rhythm Lungs: Clear to auscultation bilaterally  Abdomen: Soft, mild tenderness diffusely, distended Neurological: Alert and oriented 3. Moves all extremities 4 with equal strength. Cranial nerves II through XII grossly intact. Skin: Warm and dry. No rashes or lesions. Extremities: No clubbing or cyanosis. No edema.    Data Reviewed: I have personally reviewed following labs and imaging studies  CBC: Recent Labs  Lab 07/27/19 1414 07/28/19 0624 07/29/19 0606  WBC 17.3* 14.8* 9.8  NEUTROABS 13.5*  --  7.8*  HGB 11.4* 9.9* 10.7*  HCT 36.1 31.3* 32.9*  MCV 89.8 89.4 86.4  PLT 144* 126* 125*    Basic Metabolic Panel: Recent Labs  Lab 07/27/19 1414 07/28/19 0624 07/29/19 0606  NA 139 134*  136  K 4.0 3.6 3.4*  CL 112* 107 111  CO2 17* 21* 20*  GLUCOSE 129* 96 195*  BUN 45* 38* 27*  CREATININE 1.77* 1.39* 1.28*  CALCIUM 8.8* 8.8* 9.4  MG  --   --  1.7  PHOS  --   --  3.2    GFR: Estimated Creatinine Clearance: 23.9 mL/min (Timtohy Broski) (by C-G formula based on SCr of 1.28 mg/dL (H)).  Liver Function Tests: Recent Labs  Lab 07/27/19 1414 07/28/19 0624 07/29/19 0606  AST 23 17 18   ALT 15 12 14   ALKPHOS 43 47 63  BILITOT 0.7 0.7 0.7  PROT 6.8 6.1* 7.1  ALBUMIN 3.3* 3.0* 3.2*    CBG: Recent Labs  Lab 07/28/19 1141 07/28/19 1718 07/28/19 2140 07/29/19 0742 07/29/19 1221  GLUCAP 91 82 146* 163* 153*  Recent Results (from the past 240 hour(s))  Urine culture     Status: None   Collection Time: 07/27/19  1:45 AM   Specimen: Urine, Random  Result Value Ref Range Status   Specimen Description   Final    URINE, RANDOM Performed at [facility] [address], [place], [place] [address]    Special Requests   Final    NONE Performed at [facility], [place] [address] [place], [place] [address]    Culture   Final    NO GROWTH Performed at [facility], [place] [address] [place], [place] [address]    Report Status 07/29/2019 FINAL  Final  Culture, blood (Routine X 2) w Reflex to ID Panel     Status: None (Preliminary result)   Collection Time: 07/27/19  1:24 PM   Specimen: BLOOD RIGHT HAND  Result Value Ref Range Status   Specimen Description   Final    BLOOD RIGHT HAND Performed at [facility], [place] [address] [place], [place] [address]    Special Requests   Final    BOTTLES DRAWN AEROBIC AND ANAEROBIC Blood Culture adequate volume Performed at [facility] [address] [place], [place] [address]    Culture   Final    NO GROWTH 2 DAYS Performed at [facility] [address], [place], [place] [address]    Report Status PENDING  Incomplete  Culture, blood (Routine X 2) w  Reflex to ID Panel     Status: None (Preliminary result)   Collection Time: 07/27/19  1:29 PM   Specimen: BLOOD  Result Value Ref Range Status   Specimen Description   Final    BLOOD LEFT ANTECUBITAL Performed at [facility] [address] [place], [place] [address]    Special Requests   Final    BOTTLES DRAWN AEROBIC AND ANAEROBIC Blood Culture results may not be optimal due to an inadequate volume of blood received in culture bottles   Culture   Final    NO GROWTH 2 DAYS Performed at [facility], [place] [address] [place], [place] [address]    Report Status PENDING  Incomplete  SARS Coronavirus 2 by RT PCR (hospital order, performed in [facility] hospital lab) Nasopharyngeal Nasopharyngeal Swab     Status: None   Collection Time: 07/27/19  4:21 PM   Specimen: Nasopharyngeal Swab  Result Value Ref Range Status   SARS Coronavirus 2 NEGATIVE NEGATIVE Final    Comment: (NOTE) SARS-CoV-2 target nucleic acids are NOT DETECTED.  The SARS-CoV-2 RNA is generally detectable in upper and lower respiratory specimens during the acute phase of infection. The lowest concentration of SARS-CoV-2 viral copies this assay can detect is 250 copies / mL. Domonic Kimball negative result does not preclude SARS-CoV-2 infection and should not be used as the sole basis for treatment or other patient management decisions.  Jezel Basto negative result may occur with improper specimen collection / handling, submission of specimen other than nasopharyngeal swab, presence of viral mutation(s) within the areas targeted by this assay, and inadequate number of viral copies (<250 copies / mL). Reggie Bise negative result must be combined with clinical observations, patient history, and epidemiological information.  Fact Sheet for Patients:   StrictlyIdeas.no  Fact Sheet for Healthcare Providers: BankingDealers.co.za  This test is not yet approved or  cleared by the  [place] FDA and has been authorized for detection and/or diagnosis of  SARS-CoV-2 by FDA under an Emergency Use Authorization (EUA).  This EUA will remain in effect (meaning this test can be used) for the duration of the COVID-19 declaration under Section 564(b)(1) of the Act, 21 U.S.C. section 360bbb-3(b)(1), unless the authorization is terminated or revoked sooner.  Performed at [facility], [place] [address] [place], [place] [address]   C Difficile Quick Screen w PCR reflex     Status: None   Collection Time: 07/27/19  5:38 PM   Specimen: STOOL  Result Value Ref Range Status   C Diff antigen NEGATIVE NEGATIVE Final   C Diff toxin NEGATIVE NEGATIVE Final   C Diff interpretation No C. difficile detected.  Final    Comment: VALID Performed at [facility], [place] [address], [place], [place] [address]   Gastrointestinal Panel by PCR , Stool     Status: None   Collection Time: 07/27/19  5:38 PM   Specimen: STOOL  Result Value Ref Range Status   Campylobacter species NOT DETECTED NOT DETECTED Final   Plesimonas shigelloides NOT DETECTED NOT DETECTED Final   Salmonella species NOT DETECTED NOT DETECTED Final   Yersinia enterocolitica NOT DETECTED NOT DETECTED Final   Vibrio species NOT DETECTED NOT DETECTED Final   Vibrio cholerae NOT DETECTED NOT DETECTED Final   Enteroaggregative E coli (EAEC) NOT DETECTED NOT DETECTED Final   Enteropathogenic E coli (EPEC) NOT DETECTED NOT DETECTED Final   Enterotoxigenic E coli (ETEC) NOT DETECTED NOT DETECTED Final   Shiga like toxin producing E coli (STEC) NOT DETECTED NOT DETECTED Final   Shigella/Enteroinvasive E coli (EIEC) NOT DETECTED NOT DETECTED Final   Cryptosporidium NOT DETECTED NOT DETECTED Final   Cyclospora cayetanensis NOT DETECTED NOT DETECTED Final   Entamoeba histolytica NOT DETECTED NOT DETECTED Final   Giardia lamblia NOT DETECTED NOT DETECTED Final   Adenovirus F40/41 NOT DETECTED  NOT DETECTED Final   Astrovirus NOT DETECTED NOT DETECTED Final   Norovirus GI/GII NOT DETECTED NOT DETECTED Final   Rotavirus Steele Stracener NOT DETECTED NOT DETECTED Final   Sapovirus (I, II, IV, and V) NOT DETECTED NOT DETECTED Final    Comment: Performed at [facility], [address], [place], [place] [address]         Radiology Studies: CT ABDOMEN PELVIS WO CONTRAST  Result Date: 07/27/2019 CLINICAL DATA:  Six weeks of abdominal pain with vomiting and diarrhea. Weakness and possible fever. EXAM: CT ABDOMEN AND PELVIS WITHOUT CONTRAST TECHNIQUE: Multidetector CT imaging of the abdomen and pelvis was performed following the standard protocol without IV contrast. COMPARISON:  None. FINDINGS: Lower chest: Small amount right pleural fluid with mild bibasilar atelectatic change. Posterior bibasilar bronchiectatic change. Minimal calcified plaque over the descending thoracic aorta. Possible small hiatal hernia. Hepatobiliary: Minimal cholelithiasis. Liver and biliary tree are normal. Pancreas: Normal. Spleen: Normal. Adrenals/Urinary Tract: Adrenal glands are normal. Kidneys are normal in size without hydronephrosis or nephrolithiasis. Ureters are unremarkable. Bladder is somewhat distended. Stomach/Bowel: Possible small hiatal hernia. Small bowel is unremarkable. Previous appendectomy. Fluid-filled colon with possible mild wall thickening of the distal descending and sigmoid colon which may be seen with mild acute colitis. Vascular/Lymphatic: Moderate calcified plaque over the abdominal aorta which is normal in caliber. Few small mesenteric lymph nodes as described below. Reproductive: Uterus just left of midline and otherwise unremarkable. Adnexal regions unremarkable. Other: There is Zabria Liss masslike soft tissue density with associated coarse calcification over the mid mesentery measuring approximately 3.1 x 4 cm. There are  Florice Hindle few small adjacent mesenteric lymph nodes. Small amount of free fluid over the  cul-de-sac. Musculoskeletal: No focal abnormality. Degenerative changes of the spine with disc disease at the L4-5 and L5-S1 levels. Significant disc disease at the T11-12 level. Possible subtle grade 1 anterolisthesis of L4 on L5 and of L5 on S1. IMPRESSION: 1. Fluid-filled colon with mild wall thickening over the distal descending and sigmoid colon which may be due to an acute colitis of infectious or inflammatory nature. Small amount of associated free fluid in the pelvis. No obstruction. 2. Mass over the mid mesentery with associated coarse calcification measuring 3.1 x 4 cm. Few small associated mesenteric lymph nodes. Findings may be due to Basia Mcginty neoplastic process such as carcinoid tumor. 3.  Mild cholelithiasis. 4. Small right pleural effusion with bibasilar atelectasis as well as bibasilar bronchiectatic change. 5.  Aortic Atherosclerosis (ICD10-I70.0). Electronically Signed   By: Marin Olp M.D.   On: 07/27/2019 17:15        Scheduled Meds: . buPROPion  150 mg Oral Daily  . Chlorhexidine Gluconate Cloth  6 each Topical Daily  . escitalopram  10 mg Oral Daily  . heparin  5,000 Units Subcutaneous Q8H  . insulin aspart  0-5 Units Subcutaneous QHS  . insulin aspart  0-9 Units Subcutaneous TID WC  . mesalamine  2.4 g Oral Q breakfast  . methylPREDNISolone (SOLU-MEDROL) injection  40 mg Intravenous Daily  . mirabegron ER  50 mg Oral QHS  . pantoprazole  40 mg Oral Daily  . pregabalin  75 mg Oral BID  . vitamin B-12  1,000 mcg Oral Daily   Continuous Infusions: . lactated ringers 75 mL/hr at 07/29/19 1234  . piperacillin-tazobactam (ZOSYN)  IV 3.375 g (07/29/19 1245)     LOS: 2 days    Time spent: over 30 min    Fayrene Helper, MD [facility]   To contact the attending provider between 7A-7P or the covering provider during after hours 7P-7A, please log into the web site [email or web address] and access using universal [place] password for that web site. If you do not have  the password, please call the hospital operator.  07/29/2019, 3:51 PM

## 2019-07-29 NOTE — Progress Notes (Signed)
MD Opyd was notified about patient's BP being 176/84, no complaints of pain. No new orders at the moment.

## 2019-07-30 ENCOUNTER — Inpatient Hospital Stay (HOSPITAL_COMMUNITY): Payer: Medicare Other

## 2019-07-30 DIAGNOSIS — I480 Paroxysmal atrial fibrillation: Secondary | ICD-10-CM | POA: Diagnosis not present

## 2019-07-30 HISTORY — DX: Paroxysmal atrial fibrillation: I48.0

## 2019-07-30 LAB — CBC WITH DIFFERENTIAL/PLATELET
Abs Immature Granulocytes: 0.15 10*3/uL — ABNORMAL HIGH (ref 0.00–0.07)
Basophils Absolute: 0 10*3/uL (ref 0.0–0.1)
Basophils Relative: 0 %
Eosinophils Absolute: 0 10*3/uL (ref 0.0–0.5)
Eosinophils Relative: 0 %
HCT: 32.1 % — ABNORMAL LOW (ref 36.0–46.0)
Hemoglobin: 10.8 g/dL — ABNORMAL LOW (ref 12.0–15.0)
Immature Granulocytes: 1 %
Lymphocytes Relative: 19 %
Lymphs Abs: 3.3 10*3/uL (ref 0.7–4.0)
MCH: 28.2 pg (ref 26.0–34.0)
MCHC: 33.6 g/dL (ref 30.0–36.0)
MCV: 83.8 fL (ref 80.0–100.0)
Monocytes Absolute: 0.8 10*3/uL (ref 0.1–1.0)
Monocytes Relative: 5 %
Neutro Abs: 13.1 10*3/uL — ABNORMAL HIGH (ref 1.7–7.7)
Neutrophils Relative %: 75 %
Platelets: 160 10*3/uL (ref 150–400)
RBC: 3.83 MIL/uL — ABNORMAL LOW (ref 3.87–5.11)
RDW: 14.7 % (ref 11.5–15.5)
WBC: 17.3 10*3/uL — ABNORMAL HIGH (ref 4.0–10.5)
nRBC: 0 % (ref 0.0–0.2)

## 2019-07-30 LAB — HEMOGLOBIN A1C
Hgb A1c MFr Bld: 6.3 % — ABNORMAL HIGH (ref 4.8–5.6)
Mean Plasma Glucose: 134 mg/dL

## 2019-07-30 LAB — COMPREHENSIVE METABOLIC PANEL
ALT: 20 U/L (ref 0–44)
AST: 20 U/L (ref 15–41)
Albumin: 3.3 g/dL — ABNORMAL LOW (ref 3.5–5.0)
Alkaline Phosphatase: 66 U/L (ref 38–126)
Anion gap: 11 (ref 5–15)
BUN: 21 mg/dL (ref 8–23)
CO2: 20 mmol/L — ABNORMAL LOW (ref 22–32)
Calcium: 9.7 mg/dL (ref 8.9–10.3)
Chloride: 105 mmol/L (ref 98–111)
Creatinine, Ser: 1.04 mg/dL — ABNORMAL HIGH (ref 0.44–1.00)
GFR calc Af Amer: 56 mL/min — ABNORMAL LOW (ref >60)
GFR calc non Af Amer: 48 mL/min — ABNORMAL LOW (ref >60)
Glucose, Bld: 190 mg/dL — ABNORMAL HIGH (ref 70–99)
Potassium: 3.5 mmol/L (ref 3.5–5.1)
Sodium: 136 mmol/L (ref 135–145)
Total Bilirubin: 0.8 mg/dL (ref 0.3–1.2)
Total Protein: 7.3 g/dL (ref 6.5–8.1)

## 2019-07-30 LAB — BRAIN NATRIURETIC PEPTIDE: B Natriuretic Peptide: 1034.6 pg/mL — ABNORMAL HIGH (ref 0.0–100.0)

## 2019-07-30 LAB — TSH: TSH: 0.845 u[IU]/mL (ref 0.350–4.500)

## 2019-07-30 LAB — GLUCOSE, CAPILLARY
Glucose-Capillary: 172 mg/dL — ABNORMAL HIGH (ref 70–99)
Glucose-Capillary: 165 mg/dL — ABNORMAL HIGH (ref 70–99)
Glucose-Capillary: 226 mg/dL — ABNORMAL HIGH (ref 70–99)
Glucose-Capillary: 353 mg/dL — ABNORMAL HIGH (ref 70–99)

## 2019-07-30 LAB — PHOSPHORUS: Phosphorus: 1.7 mg/dL — ABNORMAL LOW (ref 2.5–4.6)

## 2019-07-30 LAB — HEPARIN LEVEL (UNFRACTIONATED): Heparin Unfractionated: 0.9 IU/mL — ABNORMAL HIGH (ref 0.30–0.70)

## 2019-07-30 LAB — MAGNESIUM: Magnesium: 2.4 mg/dL (ref 1.7–2.4)

## 2019-07-30 MED ORDER — FUROSEMIDE 10 MG/ML IJ SOLN
40.0000 mg | Freq: Two times a day (BID) | INTRAMUSCULAR | Status: AC
  Administered 2019-07-31 (×2): 40 mg via INTRAVENOUS
  Filled 2019-07-30 (×2): qty 4

## 2019-07-30 MED ORDER — FERROUS SULFATE 325 (65 FE) MG PO TABS
325.0000 mg | ORAL_TABLET | Freq: Every day | ORAL | Status: DC
  Administered 2019-07-31 – 2019-08-06 (×7): 325 mg via ORAL
  Filled 2019-07-30 (×7): qty 1

## 2019-07-30 MED ORDER — SODIUM CHLORIDE 0.9 % IV SOLN
INTRAVENOUS | Status: DC | PRN
  Administered 2019-07-30 – 2019-07-31 (×2): 250 mL via INTRAVENOUS

## 2019-07-30 MED ORDER — POTASSIUM CHLORIDE CRYS ER 20 MEQ PO TBCR
40.0000 meq | EXTENDED_RELEASE_TABLET | Freq: Once | ORAL | Status: AC
  Administered 2019-07-30: 40 meq via ORAL
  Filled 2019-07-30: qty 2

## 2019-07-30 MED ORDER — FUROSEMIDE 10 MG/ML IJ SOLN
40.0000 mg | Freq: Once | INTRAMUSCULAR | Status: AC
  Administered 2019-07-30: 40 mg via INTRAVENOUS
  Filled 2019-07-30: qty 4

## 2019-07-30 MED ORDER — MAGNESIUM SULFATE 2 GM/50ML IV SOLN
2.0000 g | Freq: Once | INTRAVENOUS | Status: AC
  Administered 2019-07-30: 2 g via INTRAVENOUS
  Filled 2019-07-30: qty 50

## 2019-07-30 MED ORDER — K PHOS MONO-SOD PHOS DI & MONO 155-852-130 MG PO TABS
500.0000 mg | ORAL_TABLET | Freq: Three times a day (TID) | ORAL | Status: AC
  Administered 2019-07-30 (×3): 500 mg via ORAL
  Filled 2019-07-30 (×3): qty 2

## 2019-07-30 MED ORDER — DILTIAZEM HCL-DEXTROSE 125-5 MG/125ML-% IV SOLN (PREMIX)
5.0000 mg/h | INTRAVENOUS | Status: DC
  Administered 2019-07-30: 5 mg/h via INTRAVENOUS
  Filled 2019-07-30: qty 125

## 2019-07-30 MED ORDER — HEPARIN (PORCINE) 25000 UT/250ML-% IV SOLN
700.0000 [IU]/h | INTRAVENOUS | Status: DC
  Administered 2019-08-01: 600 [IU]/h via INTRAVENOUS
  Filled 2019-07-30: qty 250

## 2019-07-30 MED ORDER — DILTIAZEM HCL 25 MG/5ML IV SOLN
15.0000 mg | Freq: Once | INTRAVENOUS | Status: AC
  Administered 2019-07-30: 15 mg via INTRAVENOUS
  Filled 2019-07-30: qty 5

## 2019-07-30 MED ORDER — HEPARIN BOLUS VIA INFUSION
2500.0000 [IU] | Freq: Once | INTRAVENOUS | Status: AC
  Administered 2019-07-30: 2500 [IU] via INTRAVENOUS
  Filled 2019-07-30: qty 2500

## 2019-07-30 MED ORDER — HEPARIN (PORCINE) 25000 UT/250ML-% IV SOLN
700.0000 [IU]/h | INTRAVENOUS | Status: DC
  Administered 2019-07-30: 700 [IU]/h via INTRAVENOUS
  Filled 2019-07-30: qty 250

## 2019-07-30 NOTE — Progress Notes (Signed)
Patient developed atrial fibrillation with RVR, denies any chest pain but notes some SOB.   CXR findings consistent with CHF. She had preserved EF with grade 1 DD in 2018.   She was started on diltiazem for rate-control, IVF held, and one dose Lasix given.

## 2019-07-30 NOTE — Care Management Important Message (Signed)
Important Message  Patient Details IM Letter given to Faylene Million RN Case Manager to present to the Patient Name: Deanna [name] MRN: [id number] Date of Birth: 12/25/1930   Medicare Important Message Given:  Yes     Kerin Salen 07/30/2019, 11:07 AM

## 2019-07-30 NOTE — Progress Notes (Signed)
Patient is c/o having difficulty breathing; sat drops to 88% at 2lpm; no c/o chest pain and nausea. MD informed with order for CXR. O2 inc to 4lpm with sat maintaining at 92%

## 2019-07-30 NOTE — Progress Notes (Addendum)
PROGRESS NOTE    LUBA MATZEN  LPF:[id number] DOB: [birth year] DOA: 07/27/2019 PCP: Marton Redwood, MD   Chief Complaint  Patient presents with  . Hypotension  . Diarrhea    Brief Narrative:  Deanna [name] is Deanna [name] 86 y.o. female with medical history significant of T2DM, HTN, depression, breast cancer, inflammatory diarrhea who presents with worsening symptoms of diarrhea for the last 6 weeks.  She notes during this time she's had diarrhea, less than half of the days of the week.  She typically takes imodium which improves the diarrhea.  Described as watery diarrhea.  Yesterday, she started having severe lower abdominal pain, cramping, tender to touch and with movement.  Had nausea/vomiting yesterday as well.  She went to doctor, but was too weak to get out of car, BP in the 70's on EMS arrival.  She denies fevers, chills, flushing, nausea, shortness of breath.  Last week, people who lived in the same place as her had Guillermina Shaft stomach bug, but she wasn't around them.  No recent abx use.  No smoking.  Rare etoh.  Follows with Dr. Earlean Shawl outpatient.    ED Course: Labs, EKG, imaging, abx, IVF.  Hospitalist for admission.  Assessment & Plan:   Active Problems:   Colitis   Atrial fibrillation with RVR (HCC)   Sepsis 2/2 Acute Colitis  Diarrhea  History of Inflammatory Diarrhea: pt has followed with Dr. Earlean Shawl for several years.  Per care everywhere, positive stool lactoferrin and calprotectin and was started on treatment with mesalamine and probiotics.  I don't see imaging on care everywhere, she had positive calprotectin and lactoferrin in 2019.  Negative CRP, celiac disease, pancreatic elastase.  She presented with diarrhea over the last 6 weeks, worsening recently, with hypotension today.  Imaging with colitis and small amount of free fluid in pelvis.  Imaging concerning for infectious vs inflammatory colitis. Being treated empirically with abx C diff negative, negative GI path panel Trial of  steroids per GI, appreciate assistance Septic with fever, tachypnea, hypotension, leukocytosis - sepsis physiology resolved GI c/s, discussed with Dr. Collene Mares, appreciate assistance - recommending discuss with Dr. Earlean Shawl regarding additional workup.  Family asking today if any procedure were recommended if it could be pursued inpatient - will discuss with GI. Continue antibiotics fecal lactoferrin positive.  GI path panel and C diff negative. Improving leukocytosis, follow Enteric precautions Blood cx pending Pain management  Atrial Fibrillation with RVR: TSH wnl.  Echo pending.  Will repeat echo today.  Chadsvasc is at least 5, 7.2% risk of stroke/year.  Daughter concerned about bleeding risk and falls.  No hx fall requiring hospitalization.  Discussed risk/benefits. Hasbled is 2 with aspirin -> will d/c aspirin if anticoagulating -> 1 (risk of bleeding 4.1 -> 3.4%).  Agreeable to starting anticoagulation with heparin while in house.  Will continue to discuss plan for outpatient anticoagulation.  Follow echo Will need cards follow up Pauses on dilt gtt, stop -> continue coreg for rate control  Volume Overload  Pulmonary Edema:  CXR with edema vs pneumonia.  She's already on abx.  She's afebrile and suspect elevated WBC is 2/2 steroids.  Suspect imaging findings c/w volume overload and pulm edema. Follow echo.  BNP. SLP eval Lasix 40 mg IV BID and follow Repeat CXR 6/22  AKI on CKD IIIa  NAGMA:  Baseline creatinine 1.3-1.6 (2018-2020).  Creatinine 1.77 on presentation, likely 2/2 sepsis and hypotension.  NAGMA likely related to AKI. Improved to 1.04 Follow with IVF  Mass of mid mesentery: concerning for neoplastic process such as carcinoid.  Doubt this is related to above, carcinoid typically associated with secretory diarrhea.  No flushing, bronchospasm.   24 hr urine 5-IAA - pending Recommending outpatient oncology follow up  Iron Def Anemia: iron supplementation  Small R Pleural  Effusion: follow with serial CXR.  Type 2 Diabetes: SSI, follow A1c 6.3.    Peripheral Neuropathy: continue lyrica  Depression: Wellbutrin, lexapro  Hypertension: resume amlodipine and coreg - hold valsartan and HCTZ  HLD: crestor  Overactive Bladder: myrbetriq  Hx Breast Cancer: follow outpatient   Hypokalemia: replace and follow   Urinary Retention: s/p foley placement.  TOV today.  DVT prophylaxis: heparin Code Status: DNR Family Communication: daughter at bedside Disposition:   Status is: Inpatient  Remains inpatient appropriate because:Inpatient level of care appropriate due to severity of illness   Dispo: The patient is from: Home              Anticipated d/c is to: pending              Anticipated d/c date is: > 3 days              Patient currently is not medically stable to d/c.   Consultants:   GI  Procedures: none  Antimicrobials:  Anti-infectives (From admission, onward)   Start     Dose/Rate Route Frequency Ordered Stop   07/28/19 1200  piperacillin-tazobactam (ZOSYN) IVPB 3.375 g     Discontinue     3.375 g 12.5 mL/hr over 240 Minutes Intravenous Every 8 hours 07/28/19 1000     07/28/19 0000  piperacillin-tazobactam (ZOSYN) IVPB 2.25 g  Status:  Discontinued        2.25 g 100 mL/hr over 30 Minutes Intravenous Every 6 hours 07/27/19 1915 07/28/19 1000   07/27/19 1400  ceFEPIme (MAXIPIME) 2 g in sodium chloride 0.9 % 100 mL IVPB        2 g 200 mL/hr over 30 Minutes Intravenous  Once 07/27/19 1357 07/27/19 1622   07/27/19 1400  metroNIDAZOLE (FLAGYL) IVPB 500 mg        500 mg 100 mL/hr over 60 Minutes Intravenous  Once 07/27/19 1357 07/27/19 1629     Subjective: Feeling Jarely Juncaj bit better today Rough night  Objective: Vitals:   07/30/19 0925 07/30/19 1000 07/30/19 1129 07/30/19 1402  BP: 122/87 134/89  115/75  Pulse: 80 91 71   Resp:  15 20   Temp:    98.6 F (37 C)  TempSrc:    Oral  SpO2:  94% 95%   Weight:      Height:         Intake/Output Summary (Last 24 hours) at 07/30/2019 1511 Last data filed at 07/30/2019 1402 Gross per 24 hour  Intake 1480.89 ml  Output 2975 ml  Net -1494.11 ml   Filed Weights   07/27/19 2027 07/29/19 0500 07/30/19 0500  Weight: 50.2 kg 48.8 kg 48 kg    Examination:  General: No acute distress. Cardiovascular: Heart sounds show Shekira Drummer regular rate, and rhythm.  Lungs: Clear to auscultation bilaterally  Abdomen: Soft, nontender, nondistended Neurological: Alert and oriented 3. Moves all extremities 4 . Cranial nerves II through XII grossly intact. Skin: Warm and dry. No rashes or lesions. Extremities: No clubbing or cyanosis. No edema.   Data Reviewed: I have personally reviewed following labs and imaging studies  CBC: Recent Labs  Lab 07/27/19 1414 07/28/19 2956 07/29/19 0606 07/30/19 0518  WBC 17.3* 14.8* 9.8 17.3*  NEUTROABS 13.5*  --  7.8* 13.1*  HGB 11.4* 9.9* 10.7* 10.8*  HCT 36.1 31.3* 32.9* 32.1*  MCV 89.8 89.4 86.4 83.8  PLT 144* 126* 125* 427    Basic Metabolic Panel: Recent Labs  Lab 07/27/19 1414 07/28/19 0624 07/29/19 0606 07/30/19 0518  NA 139 134* 136 136  K 4.0 3.6 3.4* 3.5  CL 112* 107 111 105  CO2 17* 21* 20* 20*  GLUCOSE 129* 96 195* 190*  BUN 45* 38* 27* 21  CREATININE 1.77* 1.39* 1.28* 1.04*  CALCIUM 8.8* 8.8* 9.4 9.7  MG  --   --  1.7 2.4  PHOS  --   --  3.2 1.7*    GFR: Estimated Creatinine Clearance: 28.9 mL/min (Latresa Gasser) (by C-G formula based on SCr of 1.04 mg/dL (H)).  Liver Function Tests: Recent Labs  Lab 07/27/19 1414 07/28/19 0624 07/29/19 0606 07/30/19 0518  AST 23 17 18 20   ALT 15 12 14 20   ALKPHOS 43 47 63 66  BILITOT 0.7 0.7 0.7 0.8  PROT 6.8 6.1* 7.1 7.3  ALBUMIN 3.3* 3.0* 3.2* 3.3*    CBG: Recent Labs  Lab 07/29/19 1221 07/29/19 1708 07/29/19 2116 07/30/19 0743 07/30/19 1138  GLUCAP 153* 236* 275* 172* 165*     Recent Results (from the past 240 hour(s))  Urine culture     Status: None    Collection Time: 07/27/19  1:45 AM   Specimen: Urine, Random  Result Value Ref Range Status   Specimen Description   Final    URINE, RANDOM Performed at [facility], [place] [address] [place], [place] [address]    Special Requests   Final    NONE Performed at [facility], [place] [address], [place], [place] [address]    Culture   Final    NO GROWTH Performed at [facility], [place] [address], [place], [place] [address]    Report Status 07/29/2019 FINAL  Final  Culture, blood (Routine X 2) w Reflex to ID Panel     Status: None (Preliminary result)   Collection Time: 07/27/19  1:24 PM   Specimen: BLOOD RIGHT HAND  Result Value Ref Range Status   Specimen Description   Final    BLOOD RIGHT HAND Performed at [facility], [place] [address] [place], [place] [address]    Special Requests   Final    BOTTLES DRAWN AEROBIC AND ANAEROBIC Blood Culture adequate volume Performed at [place] [address] [place], [place] [address]    Culture   Final    NO GROWTH 3 DAYS Performed at [facility], [place] [address] [place], [place] [address]    Report Status PENDING  Incomplete  Culture, blood (Routine X 2) w Reflex to ID Panel     Status: None (Preliminary result)   Collection Time: 07/27/19  1:29 PM   Specimen: BLOOD  Result Value Ref Range Status   Specimen Description   Final    BLOOD LEFT ANTECUBITAL Performed at [place] [address], [place], [place] [address]    Special Requests   Final    BOTTLES DRAWN AEROBIC AND ANAEROBIC Blood Culture results may not be optimal due to an inadequate volume of blood received in culture bottles   Culture   Final    NO GROWTH 3 DAYS Performed at [facility], [place] [address] [place], [place] [address]  Report Status PENDING  Incomplete  SARS Coronavirus 2 by RT PCR (hospital order, performed in [facility] hospital lab) Nasopharyngeal  Nasopharyngeal Swab     Status: None   Collection Time: 07/27/19  4:21 PM   Specimen: Nasopharyngeal Swab  Result Value Ref Range Status   SARS Coronavirus 2 NEGATIVE NEGATIVE Final    Comment: (NOTE) SARS-CoV-2 target nucleic acids are NOT DETECTED.  The SARS-CoV-2 RNA is generally detectable in upper and lower respiratory specimens during the acute phase of infection. The lowest concentration of SARS-CoV-2 viral copies this assay can detect is 250 copies / mL. Liah Morr negative result does not preclude SARS-CoV-2 infection and should not be used as the sole basis for treatment or other patient management decisions.  Domenique Quest negative result may occur with improper specimen collection / handling, submission of specimen other than nasopharyngeal swab, presence of viral mutation(s) within the areas targeted by this assay, and inadequate number of viral copies (<250 copies / mL). Suman Trivedi negative result must be combined with clinical observations, patient history, and epidemiological information.  Fact Sheet for Patients:   StrictlyIdeas.no  Fact Sheet for Healthcare Providers: BankingDealers.co.za  This test is not yet approved or  cleared by the [place] FDA and has been authorized for detection and/or diagnosis of SARS-CoV-2 by FDA under an Emergency Use Authorization (EUA).  This EUA will remain in effect (meaning this test can be used) for the duration of the COVID-19 declaration under Section 564(b)(1) of the Act, 21 U.S.C. section 360bbb-3(b)(1), unless the authorization is terminated or revoked sooner.  Performed at [facility], [place] [address] [place], [place] [address]   C Difficile Quick Screen w PCR reflex     Status: None   Collection Time: 07/27/19  5:38 PM   Specimen: STOOL  Result Value Ref Range Status   C Diff antigen NEGATIVE NEGATIVE Final   C Diff toxin NEGATIVE NEGATIVE Final   C Diff interpretation No  C. difficile detected.  Final    Comment: VALID Performed at [facility], [place] [address] [place], [place] [address]   Gastrointestinal Panel by PCR , Stool     Status: None   Collection Time: 07/27/19  5:38 PM   Specimen: STOOL  Result Value Ref Range Status   Campylobacter species NOT DETECTED NOT DETECTED Final   Plesimonas shigelloides NOT DETECTED NOT DETECTED Final   Salmonella species NOT DETECTED NOT DETECTED Final   Yersinia enterocolitica NOT DETECTED NOT DETECTED Final   Vibrio species NOT DETECTED NOT DETECTED Final   Vibrio cholerae NOT DETECTED NOT DETECTED Final   Enteroaggregative E coli (EAEC) NOT DETECTED NOT DETECTED Final   Enteropathogenic E coli (EPEC) NOT DETECTED NOT DETECTED Final   Enterotoxigenic E coli (ETEC) NOT DETECTED NOT DETECTED Final   Shiga like toxin producing E coli (STEC) NOT DETECTED NOT DETECTED Final   Shigella/Enteroinvasive E coli (EIEC) NOT DETECTED NOT DETECTED Final   Cryptosporidium NOT DETECTED NOT DETECTED Final   Cyclospora cayetanensis NOT DETECTED NOT DETECTED Final   Entamoeba histolytica NOT DETECTED NOT DETECTED Final   Giardia lamblia NOT DETECTED NOT DETECTED Final   Adenovirus F40/41 NOT DETECTED NOT DETECTED Final   Astrovirus NOT DETECTED NOT DETECTED Final   Norovirus GI/GII NOT DETECTED NOT DETECTED Final   Rotavirus Arionne Iams NOT DETECTED NOT DETECTED Final   Sapovirus (I, II, IV, and V) NOT DETECTED NOT DETECTED Final    Comment: Performed at [facility], [address] [place], [place] [address]         Radiology Studies: DG CHEST PORT 1 VIEW  Result Date: 07/30/2019 CLINICAL DATA:  Acute respiratory failure with hypoxia. EXAM: PORTABLE CHEST 1 VIEW COMPARISON:  Radiograph 3 days ago 07/27/2019. FINDINGS: Lower lung volumes from prior exam. There are development of bilateral infrahilar opacities since prior exam. Possible septal thickening/Kerley B-lines peripherally. Possible small right  pleural effusion. Unchanged heart size and mediastinal contours. Aortic atherosclerosis. No pneumothorax. IMPRESSION: 1. Development of bilateral infrahilar opacities which may represent pulmonary edema or pneumonia, including aspiration. Presence of septal thickening favors pulmonary edema. 2. Possible small right pleural effusion. Electronically Signed   By: Keith Rake M.D.   On: 07/30/2019 03:36        Scheduled Meds: . amLODipine  5 mg Oral Daily  . buPROPion  150 mg Oral Daily  . carvedilol  12.5 mg Oral BID WC  . Chlorhexidine Gluconate Cloth  6 each Topical Daily  . escitalopram  10 mg Oral Daily  . insulin aspart  0-5 Units Subcutaneous QHS  . insulin aspart  0-9 Units Subcutaneous TID WC  . mesalamine  2.4 g Oral Q breakfast  . methylPREDNISolone (SOLU-MEDROL) injection  40 mg Intravenous Daily  . mirabegron ER  50 mg Oral QHS  . pantoprazole  40 mg Oral Daily  . phosphorus  500 mg Oral TID  . pregabalin  75 mg Oral BID  . vitamin B-12  1,000 mcg Oral Daily   Continuous Infusions: . sodium chloride Stopped (07/30/19 0345)  . diltiazem (CARDIZEM) infusion 12.5 mg/hr (07/30/19 0600)  . heparin 700 Units/hr (07/30/19 1339)  . piperacillin-tazobactam (ZOSYN)  IV 3.375 g (07/30/19 1342)     LOS: 3 days    Time spent: over 30 min    Fayrene Helper, MD [facility]   To contact the attending provider between 7A-7P or the covering provider during after hours 7P-7A, please log into the web site [email or web address] and access using universal [place] password for that web site. If you do not have the password, please call the hospital operator.  07/30/2019, 3:11 PM

## 2019-07-30 NOTE — Progress Notes (Addendum)
Pt's heart rate went up to 130's, EKG stat shows Afib with RVR, MD informed by charged nurse thru secure chat with new orders received.

## 2019-07-30 NOTE — Progress Notes (Signed)
   07/30/19 0151  Assess: MEWS Score  Temp 97.6 F (36.4 C)  BP (!) 152/100  Pulse Rate (!) 136  Resp 18  SpO2 92 %  O2 Device Nasal Cannula  O2 Flow Rate (L/min) 2 L/min  Assess: MEWS Score  MEWS Temp 0  MEWS Systolic 0  MEWS Pulse 3  MEWS RR 0  MEWS LOC 0  MEWS Score 3  MEWS Score Color Yellow  Assess: if the MEWS score is Yellow or Red  Were vital signs taken at a resting state? Yes  Focused Assessment Documented focused assessment  Early Detection of Sepsis Score *See Row Information* Medium  MEWS guidelines implemented *See Row Information* Yes  Treat  MEWS Interventions Administered scheduled meds/treatments;Escalated (See documentation below)  Take Vital Signs  Increase Vital Sign Frequency  Yellow: Q 2hr X 2 then Q 4hr X 2, if remains yellow, continue Q 4hrs  Escalate  MEWS: Escalate Yellow: discuss with charge nurse/RN and consider discussing with provider and RRT  Notify: Charge Nurse/RN  Name of Charge Nurse/RN Notified Sharyn Lull RN  Date Charge Nurse/RN Notified 07/30/19  Time Charge Nurse/RN Notified 0151  Notify: Provider  Provider Name/Title Dr. Myna Hidalgo  Date Provider Notified 07/30/19  Notification Type  (secured chat)  Notification Reason Change in status (tachy cardic)  Response Other (Comment)  Date of Provider Response 07/30/19

## 2019-07-30 NOTE — Plan of Care (Signed)
?  Problem: Education: ?Goal: Knowledge of General Education information will improve ?Description: Including pain rating scale, medication(s)/side effects and non-pharmacologic comfort measures ?Outcome: Progressing ?  ?Problem: Health Behavior/Discharge Planning: ?Goal: Ability to manage health-related needs will improve ?Outcome: Progressing ?  ?Problem: Clinical Measurements: ?Goal: Ability to maintain clinical measurements within normal limits will improve ?Outcome: Progressing ?Goal: Will remain free from infection ?Outcome: Progressing ?Goal: Diagnostic test results will improve ?Outcome: Progressing ?Goal: Cardiovascular complication will be avoided ?Outcome: Progressing ?  ?Problem: Nutrition: ?Goal: Adequate nutrition will be maintained ?Outcome: Progressing ?  ?Problem: Coping: ?Goal: Level of anxiety will decrease ?Outcome: Progressing ?  ?Problem: Elimination: ?Goal: Will not experience complications related to bowel motility ?Outcome: Progressing ?Goal: Will not experience complications related to urinary retention ?Outcome: Progressing ?  ?Problem: Pain Managment: ?Goal: General experience of comfort will improve ?Outcome: Progressing ?  ?Problem: Safety: ?Goal: Ability to remain free from injury will improve ?Outcome: Progressing ?  ?Problem: Skin Integrity: ?Goal: Risk for impaired skin integrity will decrease ?Outcome: Progressing ?  ?

## 2019-07-30 NOTE — Progress Notes (Signed)
Pharmacist Brief Note - Anticoagulation Follow Up:  Pt is a 89yoF initiated on heparin infusion today for new onset afib.   HDW = TBW = 48 kg  Assessment:  HL = 0.90 is supratherapeutic on heparin infusion of 700 units/hr.   Confirmed with RN that heparin infusing at correct rate. No signs/symptoms of bleeding or bruising.  Goal: HL 0.3 - 0.7  Plan:  Decrease heparin infusion to 600 units/hr  Check HL in 8 hours  CBC, HL daily while on heparin infusion.   Lenis Noon, PharmD 07/30/19 10:52 PM

## 2019-07-30 NOTE — Progress Notes (Signed)
Resumed care for this patient at 0300. Pt converted into A Fib with RVR about 0145. Rate 120's-130's. Confirmed with EKG. Pt reported increased SOB and feeling unwell. MD on call, Opyd, notified. Cardizem push ordered and given. Pt continues in A Fib with rate 110's-120's. Cardizem drip started as ordered. Pt hooked up to bedside monitor.

## 2019-07-30 NOTE — Evaluation (Addendum)
Physical Therapy Evaluation Patient Details Name: Deanna [name] MRN: [id number] DOB: 1930-09-16 Today's Date: 07/30/2019   History of Present Illness  86 y.o. female with medical history significant of T2DM, HTN, depression, breast cancer, inflammatory diarrhea who presents with worsening symptoms of diarrhea for the last 6 weeks.  Pt admitted for Sepsis 2/2 Acute Colitis  Diarrhea  History of Inflammatory Diarrhea  Clinical Impression  Pt admitted with above diagnosis.  Pt currently with functional limitations due to the deficits listed below (see PT Problem List). Pt will benefit from skilled PT to increase their independence and safety with mobility to allow discharge to the venue listed below.  Pt assisted with ambulating in hallway short distance.  Daughter states pt had just "graduated" from [place]. Pt will likely need HHPT again upon d/c.  ** updated d/c recommendations to SNF on 07/31/19 as pt will not likely have assist for mobility upon d/c.  Will continue to update as therapy is able to work with pt.    Follow Up Recommendations SNF;Supervision for mobility/OOB    Equipment Recommendations  None recommended by PT    Recommendations for Other Services       Precautions / Restrictions Precautions Precautions: Fall Precaution Comments: monitor HR Restrictions Weight Bearing Restrictions: No      Mobility  Bed Mobility Overal bed mobility: Needs Assistance Bed Mobility: Supine to Sit     Supine to sit: Min guard;HOB elevated     General bed mobility comments: increased time and effort  Transfers Overall transfer level: Needs assistance Equipment used: Rolling walker (2 wheeled) Transfers: Sit to/from Stand Sit to Stand: Min assist         General transfer comment: assist to rise and steady  Ambulation/Gait Ambulation/Gait assistance: Herbalist (Feet): 40 Feet Assistive device: Rolling walker (2 wheeled) Gait Pattern/deviations:  Step-through pattern;Decreased stride length;Narrow base of support     General Gait Details: very short narrow steps, HR up to 160 briefly however mostly 80s bpm during ambulation, SPO2 89-94% on room air; reapplied 2L O2 [place] upon returning to room  Stairs            Wheelchair Mobility    Modified Rankin (Stroke Patients Only)       Balance Overall balance assessment: Needs assistance         Standing balance support: Bilateral upper extremity supported Standing balance-Leahy Scale: Poor Standing balance comment: reliant on UE support                             Pertinent Vitals/Pain Pain Assessment: No/denies pain    Home Living   Living Arrangements: Alone Available Help at Discharge: Family;Available PRN/intermittently Type of Home: Independent living facility Home Access: Level entry     Home Layout: One level Home Equipment: Walker - 2 wheels;Walker - 4 wheels      Prior Function Level of Independence: Needs assistance   Gait / Transfers Assistance Needed: ambulates with rollator  ADL's / Homemaking Assistance Needed: reports having caregiver for a few hours 2-3 times a week        Hand Dominance        Extremity/Trunk Assessment        Lower Extremity Assessment Lower Extremity Assessment: Generalized weakness       Communication   Communication: HOH  Cognition Arousal/Alertness: Awake/alert Behavior During Therapy: WFL for tasks assessed/performed Overall Cognitive Status: Within Functional Limits for tasks assessed  General Comments      Exercises     Assessment/Plan    PT Assessment Patient needs continued PT services  PT Problem List Decreased strength;Decreased range of motion;Decreased coordination;Decreased mobility;Decreased balance;Decreased knowledge of use of DME;Decreased activity tolerance;Cardiopulmonary status limiting activity       PT  Treatment Interventions DME instruction;Therapeutic exercise;Gait training;Balance training;Functional mobility training;Therapeutic activities;Patient/family education    PT Goals (Current goals can be found in the Care Plan section)  Acute Rehab PT Goals PT Goal Formulation: With patient/family Time For Goal Achievement: 08/13/19 Potential to Achieve Goals: Good    Frequency Min 3X/week   Barriers to discharge        Co-evaluation               AM-PAC PT "6 Clicks" Mobility  Outcome Measure Help needed turning from your back to your side while in a flat bed without using bedrails?: A Little Help needed moving from lying on your back to sitting on the side of a flat bed without using bedrails?: A Little Help needed moving to and from a bed to a chair (including a wheelchair)?: A Little Help needed standing up from a chair using your arms (e.g., wheelchair or bedside chair)?: A Little Help needed to walk in hospital room?: A Little Help needed climbing 3-5 steps with a railing? : A Little 6 Click Score: 18    End of Session Equipment Utilized During Treatment: Gait belt Activity Tolerance: Patient tolerated treatment well Patient left: in chair;with call bell/phone within reach;with family/visitor present Nurse Communication: Mobility status PT Visit Diagnosis: Other abnormalities of gait and mobility (R26.89)    Time: 7654-6503 PT Time Calculation (min) (ACUTE ONLY): 23 min   Charges:   PT Evaluation $PT Eval Low Complexity: 1 Low     Kati PT, DPT Acute Rehabilitation Services Pager: [phone] Office: [phone] [place] Ram E 07/30/2019, 12:21 PM

## 2019-07-30 NOTE — Progress Notes (Signed)
ANTICOAGULATION CONSULT NOTE - Initial Consult  Pharmacy Consult for heparin Indication: atrial fibrillation  Allergies  Allergen Reactions  . Codeine Nausea Only    Patient Measurements: Height: 5\' 2"  (157.5 cm) Weight: 48 kg (105 lb 14.4 oz) IBW/kg (Calculated) : 50.1 Heparin Dosing Weight:   Vital Signs: Temp: 98.4 F (36.9 C) (06/21 0334) Temp Source: Oral (06/21 0334) BP: 134/89 (06/21 1000) Pulse Rate: 71 (06/21 1129)  Labs: Recent Labs    07/27/19 1414 07/27/19 1414 07/28/19 0624 07/28/19 0624 07/29/19 0606 07/30/19 0518  HGB 11.4*   < > 9.9*   < > 10.7* 10.8*  HCT 36.1   < > 31.3*  --  32.9* 32.1*  PLT 144*   < > 126*  --  125* 160  APTT 35  --   --   --   --   --   LABPROT 14.3  --   --   --   --   --   INR 1.2  --   --   --   --   --   CREATININE 1.77*   < > 1.39*  --  1.28* 1.04*   < > = values in this interval not displayed.    Estimated Creatinine Clearance: 28.9 mL/min (A) (by C-G formula based on SCr of 1.04 mg/dL (H)).   Medical History: Past Medical History:  Diagnosis Date  . Arthritis    all over  . Cancer ([place])    breast  . Depression   . Diabetes mellitus without complication (HCC)    diet controlled  . Hypertension       Assessment: 86 y.o.femalewith medical history significant ofT2DM, HTN, depression, breast cancer, inflammatory diarrhea who presents with worsening symptoms of diarrhea.  Patient developed atrial fibrillation with RVR, denies any chest pain but notes some SOB.  Pharmacy consulted to dose heparin.  No prior AC noted.  07/30/2019 H/H low but stable Plts 160 APTT/INR WNL on 6/18    Goal of Therapy:  Heparin level 0.3-0.7 units/ml Monitor platelets by anticoagulation protocol:   Plan:  Stop SQ heparin, LD 0552 this am Give heparin bolus 2500 units x 1 then start heparin drip at 700 units/hr Daily CBC Heparin level in 8 hours  Dolly Rias RPh 07/30/2019, 12:33 PM

## 2019-07-31 ENCOUNTER — Inpatient Hospital Stay (HOSPITAL_COMMUNITY): Payer: Medicare Other

## 2019-07-31 DIAGNOSIS — I340 Nonrheumatic mitral (valve) insufficiency: Secondary | ICD-10-CM

## 2019-07-31 DIAGNOSIS — I361 Nonrheumatic tricuspid (valve) insufficiency: Secondary | ICD-10-CM

## 2019-07-31 LAB — COMPREHENSIVE METABOLIC PANEL
ALT: 16 U/L (ref 0–44)
AST: 14 U/L — ABNORMAL LOW (ref 15–41)
Albumin: 3.3 g/dL — ABNORMAL LOW (ref 3.5–5.0)
Alkaline Phosphatase: 61 U/L (ref 38–126)
Anion gap: 16 — ABNORMAL HIGH (ref 5–15)
BUN: 30 mg/dL — ABNORMAL HIGH (ref 8–23)
CO2: 23 mmol/L (ref 22–32)
Calcium: 9.5 mg/dL (ref 8.9–10.3)
Chloride: 102 mmol/L (ref 98–111)
Creatinine, Ser: 1.45 mg/dL — ABNORMAL HIGH (ref 0.44–1.00)
GFR calc Af Amer: 37 mL/min — ABNORMAL LOW (ref >60)
GFR calc non Af Amer: 32 mL/min — ABNORMAL LOW (ref >60)
Glucose, Bld: 197 mg/dL — ABNORMAL HIGH (ref 70–99)
Potassium: 3.1 mmol/L — ABNORMAL LOW (ref 3.5–5.1)
Sodium: 141 mmol/L (ref 135–145)
Total Bilirubin: 1 mg/dL (ref 0.3–1.2)
Total Protein: 7.4 g/dL (ref 6.5–8.1)

## 2019-07-31 LAB — CBC
HCT: 36.3 % (ref 36.0–46.0)
Hemoglobin: 12.1 g/dL (ref 12.0–15.0)
MCH: 27.9 pg (ref 26.0–34.0)
MCHC: 33.3 g/dL (ref 30.0–36.0)
MCV: 83.8 fL (ref 80.0–100.0)
Platelets: 194 10*3/uL (ref 150–400)
RBC: 4.33 MIL/uL (ref 3.87–5.11)
RDW: 14.5 % (ref 11.5–15.5)
WBC: 15.3 10*3/uL — ABNORMAL HIGH (ref 4.0–10.5)
nRBC: 0 % (ref 0.0–0.2)

## 2019-07-31 LAB — HEMOGLOBIN A1C
Hgb A1c MFr Bld: 6.4 % — ABNORMAL HIGH (ref 4.8–5.6)
Mean Plasma Glucose: 137 mg/dL

## 2019-07-31 LAB — GLUCOSE, CAPILLARY
Glucose-Capillary: 190 mg/dL — ABNORMAL HIGH (ref 70–99)
Glucose-Capillary: 359 mg/dL — ABNORMAL HIGH (ref 70–99)
Glucose-Capillary: 264 mg/dL — ABNORMAL HIGH (ref 70–99)
Glucose-Capillary: 280 mg/dL — ABNORMAL HIGH (ref 70–99)

## 2019-07-31 LAB — 5 HIAA, QUANTITATIVE, URINE, 24 HOUR
5-HIAA, Ur: 2.1 mg/L
5-HIAA,Quant.,24 Hr Urine: 5.3 mg/24 hr (ref 0.0–14.9)
Total Volume: 2500

## 2019-07-31 LAB — ECHOCARDIOGRAM COMPLETE
Height: 62 in
Weight: 1673.6 oz

## 2019-07-31 LAB — BRAIN NATRIURETIC PEPTIDE: B Natriuretic Peptide: 874.8 pg/mL — ABNORMAL HIGH (ref 0.0–100.0)

## 2019-07-31 LAB — PHOSPHORUS: Phosphorus: 2.8 mg/dL (ref 2.5–4.6)

## 2019-07-31 LAB — MAGNESIUM: Magnesium: 1.7 mg/dL (ref 1.7–2.4)

## 2019-07-31 LAB — HEPARIN LEVEL (UNFRACTIONATED)
Heparin Unfractionated: 0.71 IU/mL — ABNORMAL HIGH (ref 0.30–0.70)
Heparin Unfractionated: 0.13 IU/mL — ABNORMAL LOW (ref 0.30–0.70)

## 2019-07-31 MED ORDER — DILTIAZEM HCL-DEXTROSE 125-5 MG/125ML-% IV SOLN (PREMIX)
5.0000 mg/h | INTRAVENOUS | Status: DC
  Administered 2019-07-31: 5 mg/h via INTRAVENOUS
  Filled 2019-07-31: qty 125

## 2019-07-31 MED ORDER — POTASSIUM CHLORIDE CRYS ER 20 MEQ PO TBCR
40.0000 meq | EXTENDED_RELEASE_TABLET | ORAL | Status: AC
  Administered 2019-07-31 – 2019-08-01 (×2): 40 meq via ORAL
  Filled 2019-07-31 (×2): qty 2

## 2019-07-31 MED ORDER — INSULIN ASPART 100 UNIT/ML ~~LOC~~ SOLN
2.0000 [IU] | Freq: Three times a day (TID) | SUBCUTANEOUS | Status: DC
  Administered 2019-07-31 – 2019-08-06 (×11): 2 [IU] via SUBCUTANEOUS

## 2019-07-31 MED ORDER — CARVEDILOL 6.25 MG PO TABS: 6.2500 mg | ORAL_TABLET | Freq: Four times a day (QID) | ORAL | Status: DC

## 2019-07-31 MED ORDER — CARVEDILOL 25 MG PO TABS
25.0000 mg | ORAL_TABLET | Freq: Two times a day (BID) | ORAL | Status: DC
  Administered 2019-07-31 – 2019-08-06 (×11): 25 mg via ORAL
  Filled 2019-07-31 (×11): qty 1

## 2019-07-31 MED ORDER — METOPROLOL TARTRATE 5 MG/5ML IV SOLN
2.5000 mg | INTRAVENOUS | Status: DC | PRN
  Administered 2019-07-31 (×2): 2.5 mg via INTRAVENOUS
  Filled 2019-07-31: qty 5

## 2019-07-31 MED ORDER — INSULIN GLARGINE 100 UNIT/ML ~~LOC~~ SOLN
5.0000 [IU] | Freq: Every day | SUBCUTANEOUS | Status: DC
  Administered 2019-07-31 – 2019-08-06 (×6): 5 [IU] via SUBCUTANEOUS
  Filled 2019-07-31 (×7): qty 0.05

## 2019-07-31 MED ORDER — LOPERAMIDE HCL 2 MG PO CAPS
2.0000 mg | ORAL_CAPSULE | Freq: Four times a day (QID) | ORAL | Status: DC
  Administered 2019-07-31 – 2019-08-05 (×14): 2 mg via ORAL
  Filled 2019-07-31 (×16): qty 1

## 2019-07-31 NOTE — Progress Notes (Signed)
Pt c/o "hot flashes" and shortness of breath. HR on tele 120's-130's in a fib. Dr. Powell made aware. EKG complete. IV Metoprolol 2.5 mg given x2. HR now 90-100's. Dr. Powell made aware. Will continue to monitor. 

## 2019-07-31 NOTE — Progress Notes (Signed)
Pt voided on bedside commode. PVR = 624 ml's. Pt voices that she feels that she can not fully empty her bladder. New order received to place foley catheter for acute urinary retention. Daughter and patient updated and verbalized understanding. 45fr foley placed without difficulty. 600 cc's of clear, yellow urine returned.

## 2019-07-31 NOTE — Progress Notes (Addendum)
Bladder scan performed per orders. Pt with 724 mL in bladder. This RN assisted pt up to [facility] to urinate. Unable to measure d/t mixture with diarrhea. Post void residual showed 511 mL. MD on call, Hal Hope, notified and order for in and out cath given. 700 mL of urine returned from in and out. Pt verbalizes that she is scared to use purewick because she does not want to have diarrhea in the bed. Encouraged pt to call for help to get up to [facility] when she has the urge to urinate. Pt verbalizes understanding. Will bladder scan again at 0600 per order.  0630: Bladder scan showed 364 mL. Pt up to [facility] with minimal urine output. Post void residual 359 mL. MD on call ordered I&O cath. 400 mL urine returned.

## 2019-07-31 NOTE — Progress Notes (Signed)
PROGRESS NOTE    Deanna [name]  ZOX:[id number] DOB: May 26, 1930 DOA: 07/27/2019 PCP: Marton Redwood, MD   Chief Complaint  Patient presents with  . Hypotension  . Diarrhea    Brief Narrative:  Deanna [name] is Deanna [name] 86 y.o. female with medical history significant of T2DM, HTN, depression, breast cancer, inflammatory diarrhea who presents with worsening symptoms of diarrhea for the last 6 weeks.  She notes during this time she's had diarrhea, less than half of the days of the week.  She typically takes imodium which improves the diarrhea.  Described as watery diarrhea.  Yesterday, she started having severe lower abdominal pain, cramping, tender to touch and with movement.  Had nausea/vomiting yesterday as well.  She went to doctor, but was too weak to get out of car, BP in the 70's on EMS arrival.  She denies fevers, chills, flushing, nausea, shortness of breath.  Last week, people who lived in the same place as her had Adynn Caseres stomach bug, but she wasn't around them.  No recent abx use.  No smoking.  Rare etoh.  Follows with Dr. Earlean Shawl outpatient.    ED Course: Labs, EKG, imaging, abx, IVF.  Hospitalist for admission.  Assessment & Plan:   Active Problems:   Colitis   Atrial fibrillation with RVR (HCC)  Atrial Fibrillation with RVR: TSH wnl.  Echo 60-65%, no RWMA, mildly elevated PASP.  Will repeat echo today.  Chadsvasc is at least 5, 7.2% risk of stroke/year.  Daughter concerned about bleeding risk and falls.  No hx fall requiring hospitalization.  Discussed risk/benefits. Hasbled is 2 with aspirin -> will d/c aspirin if anticoagulating -> 1 (risk of bleeding 4.1 -> 3.4%).  Agreeable to starting anticoagulation after risk/benefit discussion.  Needs transition to PO after plan finalized regarding need for procedure.  Follow echo (EF 60-65%, no regional WMA, mod concentric LVH, mildly elevated PASP - see report) Will need cards follow up outpatient RVR again this afternoon.  Repeat CXR.  IV  metop.  Will start diltiazem gtt.      Sepsis 2/2 Acute Colitis  Diarrhea  History of Inflammatory Diarrhea: pt has followed with Dr. Earlean Shawl for several years.  Per care everywhere, positive stool lactoferrin and calprotectin and was started on treatment with mesalamine and probiotics.  I don't see imaging/colonoscopy on care everywhere, she had positive calprotectin and lactoferrin in 2019.  Negative CRP, celiac disease, pancreatic elastase at that time.  She presented with diarrhea over the last 6 weeks, worsening recently, with hypotension today.  Imaging with colitis and small amount of free fluid in pelvis.  Imaging concerning for infectious vs inflammatory colitis. Being treated empirically with abx C diff negative, negative GI path panel Trial of steroids per GI, appreciate assistance - will need prolonged taper (once transitioned to po, would d/c with 40 mg prednisone and decrease by 5 mg every 7 days) Septic with fever, tachypnea, hypotension, leukocytosis - sepsis physiology resolved GI c/s, discussed with Dr. Collene Mares, appreciate assistance - recommending discuss with Dr. Earlean Shawl regarding additional workup.   Family asking today if any procedure were recommended if it could be pursued inpatient - will formally consult GI regarding possibility of inpatient diagnostic procedure (will need cardiac issues to settle down first with recurrent RVR today) Continue antibiotics fecal lactoferrin positive.  GI path panel and C diff negative. Improving leukocytosis, follow Enteric precautions Blood cx pending Pain management  Volume Overload  Pulmonary Edema  Diastolic HF:  CXR with edema vs pneumonia.  She's already on abx.  She's afebrile and suspect elevated WBC is 2/2 steroids.  Suspect imaging findings c/w volume overload and pulm edema. Follow echo with preserved EF.  BNP downtrending. SLP eval Hold additional lasix after today with worsening creatinine and improved CXR   Repeat CXR 6/22  with improved interstitial edema  AKI on CKD IIIa  NAGMA:  Baseline creatinine 1.3-1.6 (2018-2020).  Creatinine 1.77 on presentation, likely 2/2 sepsis and hypotension.  NAGMA likely related to AKI. Creatinine up to 1.45 today - in setting of diuresis, hold further diuresis and follow.   Mass of mid mesentery: concerning for neoplastic process such as carcinoid.  Doubt this is related to above, carcinoid typically associated with secretory diarrhea.  No flushing, bronchospasm.   24 hr urine 5-IAA - wnl Oncology recommended outpatient oncology follow up  Iron Def Anemia: iron supplementation  Small R Pleural Effusion: follow with serial CXR.  Type 2 Diabetes: SSI, follow A1c 6.3. Start basal bolus with steroids.  Peripheral Neuropathy: continue lyrica  Depression: Wellbutrin, lexapro  Hypertension: Hold amlodipine.  Resume coreg - hold valsartan and HCTZ  HLD: crestor  Overactive Bladder: myrbetriq  Hx Breast Cancer: follow outpatient   Hypokalemia: replace and follow   Urinary Retention: s/p foley placement.  TOV today.  DVT prophylaxis: heparin Code Status: DNR Family Communication: daughter at bedside Disposition:   Status is: Inpatient  Remains inpatient appropriate because:Inpatient level of care appropriate due to severity of illness   Dispo: The patient is from: Home              Anticipated d/c is to: pending              Anticipated d/c date is: > 3 days              Patient currently is not medically stable to d/c.   Consultants:   GI  Procedures: Echo IMPRESSIONS    1. Left ventricular ejection fraction, by estimation, is 60 to 65%. The  left ventricle has normal function. The left ventricle has no regional  wall motion abnormalities. There is moderate concentric left ventricular  hypertrophy. Left ventricular  diastolic parameters are indeterminate. Elevated left ventricular  end-diastolic pressure.  2. Right ventricular systolic  function is normal. The right ventricular  size is normal. There is mildly elevated pulmonary artery systolic  pressure. The estimated right ventricular systolic pressure is 12.8 mmHg.  3. The mitral valve is normal in structure. Mild mitral valve  regurgitation. No evidence of mitral stenosis.  4. Tricuspid valve regurgitation is moderate.  5. The aortic valve is tricuspid. Aortic valve regurgitation is not  visualized. Mild to moderate aortic valve sclerosis/calcification is  present, without any evidence of aortic stenosis.  6. The inferior vena cava is normal in size with greater than 50%  respiratory variability, suggesting right atrial pressure of 3 mmHg.   Antimicrobials:  Anti-infectives (From admission, onward)   Start     Dose/Rate Route Frequency Ordered Stop   07/28/19 1200  piperacillin-tazobactam (ZOSYN) IVPB 3.375 g     Discontinue     3.375 g 12.5 mL/hr over 240 Minutes Intravenous Every 8 hours 07/28/19 1000     07/28/19 0000  piperacillin-tazobactam (ZOSYN) IVPB 2.25 g  Status:  Discontinued        2.25 g 100 mL/hr over 30 Minutes Intravenous Every 6 hours 07/27/19 1915 07/28/19 1000   07/27/19 1400  ceFEPIme (MAXIPIME) 2 g in sodium chloride 0.9 % 100 mL  IVPB        2 g 200 mL/hr over 30 Minutes Intravenous  Once 07/27/19 1357 07/27/19 1622   07/27/19 1400  metroNIDAZOLE (FLAGYL) IVPB 500 mg        500 mg 100 mL/hr over 60 Minutes Intravenous  Once 07/27/19 1357 07/27/19 1629     Subjective: Feeling progressively better  Objective: Vitals:   07/31/19 1345 07/31/19 1347 07/31/19 1352 07/31/19 1749  BP: 130/86 117/90    Pulse: 81  82 (!) 105  Resp: 10  (!) 25   Temp: 97.6 F (36.4 C) 98.6 F (37 C)    TempSrc:  Oral    SpO2: 90%  95%   Weight:      Height:        Intake/Output Summary (Last 24 hours) at 07/31/2019 1854 Last data filed at 07/31/2019 1800 Gross per 24 hour  Intake 413.6 ml  Output 2100 ml  Net -1686.4 ml   Filed Weights    07/29/19 0500 07/30/19 0500 07/31/19 0500  Weight: 48.8 kg 48 kg 47.4 kg    Examination:  General: No acute distress. Cardiovascular: irreg irregu, tachy Lungs: Clear to auscultation bilaterally Abdomen: Soft, nontender, nondistended  Neurological: Alert and oriented 3. Moves all extremities 4. Cranial nerves II through XII grossly intact. Skin: Warm and dry. No rashes or lesions. Extremities: No clubbing or cyanosis. No edema.   Data Reviewed: I have personally reviewed following labs and imaging studies  CBC: Recent Labs  Lab 07/27/19 1414 07/28/19 0624 07/29/19 0606 07/30/19 0518 07/31/19 0746  WBC 17.3* 14.8* 9.8 17.3* 15.3*  NEUTROABS 13.5*  --  7.8* 13.1*  --   HGB 11.4* 9.9* 10.7* 10.8* 12.1  HCT 36.1 31.3* 32.9* 32.1* 36.3  MCV 89.8 89.4 86.4 83.8 83.8  PLT 144* 126* 125* 160 240    Basic Metabolic Panel: Recent Labs  Lab 07/27/19 1414 07/28/19 0624 07/29/19 0606 07/30/19 0518 07/31/19 0746  NA 139 134* 136 136 141  K 4.0 3.6 3.4* 3.5 3.1*  CL 112* 107 111 105 102  CO2 17* 21* 20* 20* 23  GLUCOSE 129* 96 195* 190* 197*  BUN 45* 38* 27* 21 30*  CREATININE 1.77* 1.39* 1.28* 1.04* 1.45*  CALCIUM 8.8* 8.8* 9.4 9.7 9.5  MG  --   --  1.7 2.4 1.7  PHOS  --   --  3.2 1.7* 2.8    GFR: Estimated Creatinine Clearance: 20.5 mL/min (Nereyda Bowler) (by C-G formula based on SCr of 1.45 mg/dL (H)).  Liver Function Tests: Recent Labs  Lab 07/27/19 1414 07/28/19 0624 07/29/19 0606 07/30/19 0518 07/31/19 0746  AST 23 17 18 20  14*  ALT 15 12 14 20 16   ALKPHOS 43 47 63 66 61  BILITOT 0.7 0.7 0.7 0.8 1.0  PROT 6.8 6.1* 7.1 7.3 7.4  ALBUMIN 3.3* 3.0* 3.2* 3.3* 3.3*    CBG: Recent Labs  Lab 07/30/19 1538 07/30/19 2140 07/31/19 0754 07/31/19 1156 07/31/19 1632  GLUCAP 226* 353* 190* 359* 264*     Recent Results (from the past 240 hour(s))  Urine culture     Status: None   Collection Time: 07/27/19  1:45 AM   Specimen: Urine, Random  Result Value Ref Range  Status   Specimen Description   Final    URINE, RANDOM Performed at [facility], [place] [address], [place], [place] [address]    Special Requests   Final    NONE Performed at [facility], [address] [place], [place] [address]    Culture   Final    NO GROWTH Performed at [facility], [place] [address], [place], [place] [address]    Report Status 07/29/2019 FINAL  Final  Culture, blood (Routine X 2) w Reflex to ID Panel     Status: None (Preliminary result)   Collection Time: 07/27/19  1:24 PM   Specimen: BLOOD RIGHT HAND  Result Value Ref Range Status   Specimen Description   Final    BLOOD RIGHT HAND Performed at [facility], [place] [address], [place], [place] [address]    Special Requests   Final    BOTTLES DRAWN AEROBIC AND ANAEROBIC Blood Culture adequate volume Performed at [place] [address] [place], [place] [address]    Culture   Final    NO GROWTH 4 DAYS Performed at [facility], [place] [address] [place], [place] [address]    Report Status PENDING  Incomplete  Culture, blood (Routine X 2) w Reflex to ID Panel     Status: None (Preliminary result)   Collection Time: 07/27/19  1:29 PM   Specimen: BLOOD  Result Value Ref Range Status   Specimen Description   Final    BLOOD LEFT ANTECUBITAL Performed at [place] [address] [place], [place] [address]    Special Requests   Final    BOTTLES DRAWN AEROBIC AND ANAEROBIC Blood Culture results may not be optimal due to an inadequate volume of blood received in culture bottles   Culture   Final    NO GROWTH 4 DAYS Performed at [facility], [place] [address], [place],  [address]    Report Status PENDING  Incomplete  SARS Coronavirus 2 by RT PCR (hospital order, performed in [facility] hospital lab) Nasopharyngeal Nasopharyngeal Swab     Status: None   Collection Time: 07/27/19  4:21 PM   Specimen:  Nasopharyngeal Swab  Result Value Ref Range Status   SARS Coronavirus 2 NEGATIVE NEGATIVE Final    Comment: (NOTE) SARS-CoV-2 target nucleic acids are NOT DETECTED.  The SARS-CoV-2 RNA is generally detectable in upper and lower respiratory specimens during the acute phase of infection. The lowest concentration of SARS-CoV-2 viral copies this assay can detect is 250 copies / mL. Kue Fox negative result does not preclude SARS-CoV-2 infection and should not be used as the sole basis for treatment or other patient management decisions.  Mearl Harewood negative result may occur with improper specimen collection / handling, submission of specimen other than nasopharyngeal swab, presence of viral mutation(s) within the areas targeted by this assay, and inadequate number of viral copies (<250 copies / mL). Hazim Treadway negative result must be combined with clinical observations, patient history, and epidemiological information.  Fact Sheet for Patients:   StrictlyIdeas.no  Fact Sheet for Healthcare Providers: BankingDealers.co.za  This test is not yet approved or  cleared by the [place] FDA and has been authorized for detection and/or diagnosis of SARS-CoV-2 by FDA under an Emergency Use Authorization (EUA).  This EUA will remain in effect (meaning this test can be used) for the duration of the COVID-19 declaration under Section 564(b)(1) of the Act, 21 U.S.C. section 360bbb-3(b)(1), unless the authorization is terminated or revoked sooner.  Performed at [facility], [place] [address], [place], [place] [address]   C Difficile Quick Screen w PCR reflex     Status: None   Collection Time:  07/27/19  5:38 PM   Specimen: STOOL  Result Value Ref Range Status   C Diff antigen NEGATIVE NEGATIVE Final   C Diff toxin NEGATIVE NEGATIVE Final   C Diff interpretation No C. difficile detected.  Final    Comment: VALID Performed at [facility], [place] [address] [place], [place] [address]   Gastrointestinal Panel by PCR , Stool     Status: None   Collection Time: 07/27/19  5:38 PM   Specimen: STOOL  Result Value Ref Range Status   Campylobacter species NOT DETECTED NOT DETECTED Final   Plesimonas shigelloides NOT DETECTED NOT DETECTED Final   Salmonella species NOT DETECTED NOT DETECTED Final   Yersinia enterocolitica NOT DETECTED NOT DETECTED Final   Vibrio species NOT DETECTED NOT DETECTED Final   Vibrio cholerae NOT DETECTED NOT DETECTED Final   Enteroaggregative E coli (EAEC) NOT DETECTED NOT DETECTED Final   Enteropathogenic E coli (EPEC) NOT DETECTED NOT DETECTED Final   Enterotoxigenic E coli (ETEC) NOT DETECTED NOT DETECTED Final   Shiga like toxin producing E coli (STEC) NOT DETECTED NOT DETECTED Final   Shigella/Enteroinvasive E coli (EIEC) NOT DETECTED NOT DETECTED Final   Cryptosporidium NOT DETECTED NOT DETECTED Final   Cyclospora cayetanensis NOT DETECTED NOT DETECTED Final   Entamoeba histolytica NOT DETECTED NOT DETECTED Final   Giardia lamblia NOT DETECTED NOT DETECTED Final   Adenovirus F40/41 NOT DETECTED NOT DETECTED Final   Astrovirus NOT DETECTED NOT DETECTED Final   Norovirus GI/GII NOT DETECTED NOT DETECTED Final   Rotavirus Loris Seelye NOT DETECTED NOT DETECTED Final   Sapovirus (I, II, IV, and V) NOT DETECTED NOT DETECTED Final    Comment: Performed at [facility], [address] [place], [place] [address]         Radiology Studies: DG CHEST PORT 1 VIEW  Result Date: 07/31/2019 CLINICAL DATA:  Hypoxia. EXAM: PORTABLE CHEST 1 VIEW COMPARISON:  07/30/2019 FINDINGS: Normal heart size. No pleural effusion. Interval improvement in interstitial edema. No airspace consolidation identified. Previous ACDF within the lower cervical spine. IMPRESSION: Interval improvement in interstitial edema. Electronically Signed   By: Kerby Moors M.D.   On: 07/31/2019 09:15   DG CHEST PORT 1  VIEW  Result Date: 07/30/2019 CLINICAL DATA:  Acute respiratory failure with hypoxia. EXAM: PORTABLE CHEST 1 VIEW COMPARISON:  Radiograph 3 days ago 07/27/2019. FINDINGS: Lower lung volumes from prior exam. There are development of bilateral infrahilar opacities since prior exam. Possible septal thickening/Kerley B-lines peripherally. Possible small right pleural effusion. Unchanged heart size and mediastinal contours. Aortic atherosclerosis. No pneumothorax. IMPRESSION: 1. Development of bilateral infrahilar opacities which may represent pulmonary edema or pneumonia, including aspiration. Presence of septal thickening favors pulmonary edema. 2. Possible small right pleural effusion. Electronically Signed   By: Keith Rake M.D.   On: 07/30/2019 03:36   ECHOCARDIOGRAM COMPLETE  Result Date: 07/31/2019    ECHOCARDIOGRAM REPORT   Patient Name:   ARIEONNA MEDINE Date of Exam: 07/31/2019 Medical Rec #:  [id number]         Height:       62.0 in Accession #:    [id number]        Weight:       104.6 lb Date of Birth:  [birth year]         BSA:          1.451 m Patient Age:    51 years  BP:           128/78 mmHg Patient Gender: F                 HR:           103 bpm. Exam Location:  Inpatient Procedure: 2D Echo, Cardiac Doppler and Color Doppler Indications:    Atrial fibrillation  History:        Patient has prior history of Echocardiogram examinations, most                 recent 09/29/2016. Arrythmias:Atrial Fibrillation; Risk                 Factors:Diabetes and Hypertension. Sepsis, Breast cancer.  Sonographer:    Dustin Flock Referring Phys: [phone] Remell Giaimo CALDWELL [place]  1. Left ventricular ejection fraction, by estimation, is 60 to 65%. The left ventricle has normal function. The left ventricle has no regional wall motion abnormalities. There is moderate concentric left ventricular hypertrophy. Left ventricular diastolic parameters are indeterminate. Elevated left ventricular  end-diastolic pressure.  2. Right ventricular systolic function is normal. The right ventricular size is normal. There is mildly elevated pulmonary artery systolic pressure. The estimated right ventricular systolic pressure is 01.7 mmHg.  3. The mitral valve is normal in structure. Mild mitral valve regurgitation. No evidence of mitral stenosis.  4. Tricuspid valve regurgitation is moderate.  5. The aortic valve is tricuspid. Aortic valve regurgitation is not visualized. Mild to moderate aortic valve sclerosis/calcification is present, without any evidence of aortic stenosis.  6. The inferior vena cava is normal in size with greater than 50% respiratory variability, suggesting right atrial pressure of 3 mmHg. FINDINGS  Left Ventricle: Left ventricular ejection fraction, by estimation, is 60 to 65%. The left ventricle has normal function. The left ventricle has no regional wall motion abnormalities. The left ventricular internal cavity size was normal in size. There is  moderate concentric left ventricular hypertrophy. Left ventricular diastolic parameters are indeterminate. Elevated left ventricular end-diastolic pressure. Right Ventricle: The right ventricular size is normal. No increase in right ventricular wall thickness. Right ventricular systolic function is normal. There is mildly elevated pulmonary artery systolic pressure. The tricuspid regurgitant velocity is 3.08  m/s, and with an assumed right atrial pressure of 3 mmHg, the estimated right ventricular systolic pressure is 51.0 mmHg. Left Atrium: Left atrial size was normal in size. Right Atrium: Right atrial size was normal in size. Pericardium: There is no evidence of pericardial effusion. Mitral Valve: The mitral valve is normal in structure. There is mild calcification of the mitral valve leaflet(s). Normal mobility of the mitral valve leaflets. Mild to moderate mitral annular calcification. Mild mitral valve regurgitation. No evidence of mitral valve  stenosis. Tricuspid Valve: The tricuspid valve is normal in structure. Tricuspid valve regurgitation is moderate . No evidence of tricuspid stenosis. Aortic Valve: The aortic valve is tricuspid. Aortic valve regurgitation is not visualized. Mild to moderate aortic valve sclerosis/calcification is present, without any evidence of aortic stenosis. Pulmonic Valve: The pulmonic valve was normal in structure. Pulmonic valve regurgitation is not visualized. No evidence of pulmonic stenosis. Aorta: The aortic root is normal in size and structure. Venous: The inferior vena cava is normal in size with greater than 50% respiratory variability, suggesting right atrial pressure of 3 mmHg. IAS/Shunts: No atrial level shunt detected by color flow Doppler.  LEFT VENTRICLE PLAX 2D LVIDd:         2.70 cm  Diastology LVIDs:  1.90 cm  LV e' lateral:   4.64 cm/s LV PW:         1.30 cm  LV E/e' lateral: 18.2 LV IVS:        1.30 cm  LV e' medial:    3.77 cm/s LVOT diam:     1.70 cm  LV E/e' medial:  22.4 LV SV:         41 LV SV Index:   28 LVOT Area:     2.27 cm  RIGHT VENTRICLE RV Basal diam:  2.90 cm RV S prime:     10.60 cm/s TAPSE (M-mode): 1.9 cm LEFT ATRIUM             Index       RIGHT ATRIUM           Index LA diam:        4.10 cm 2.82 cm/m  RA Area:     12.40 cm LA Vol (A2C):   34.1 ml 23.49 ml/m RA Volume:   29.40 ml  20.26 ml/m LA Vol (A4C):   60.4 ml 41.62 ml/m LA Biplane Vol: 49.6 ml 34.17 ml/m  AORTIC VALVE LVOT Vmax:   95.30 cm/s LVOT Vmean:  71.600 cm/s LVOT VTI:    0.180 m  AORTA Ao Root diam: 2.50 cm MITRAL VALVE               TRICUSPID VALVE MV Area (PHT): 5.54 cm    TR Peak grad:   37.9 mmHg MV Decel Time: 137 msec    TR Vmax:        308.00 cm/s MV E velocity: 84.40 cm/s                            SHUNTS                            Systemic VTI:  0.18 m                            Systemic Diam: 1.70 cm Fransico Him MD Electronically signed by Fransico Him MD Signature Date/Time: 07/31/2019/1:36:53 PM     Final         Scheduled Meds: . buPROPion  150 mg Oral Daily  . carvedilol  25 mg Oral BID WC  . Chlorhexidine Gluconate Cloth  6 each Topical Daily  . escitalopram  10 mg Oral Daily  . ferrous sulfate  325 mg Oral Q breakfast  . insulin aspart  0-5 Units Subcutaneous QHS  . insulin aspart  0-9 Units Subcutaneous TID WC  . insulin aspart  2 Units Subcutaneous TID WC  . insulin glargine  5 Units Subcutaneous Daily  . loperamide  2 mg Oral QID  . mesalamine  2.4 g Oral Q breakfast  . methylPREDNISolone (SOLU-MEDROL) injection  40 mg Intravenous Daily  . mirabegron ER  50 mg Oral QHS  . pantoprazole  40 mg Oral Daily  . pregabalin  75 mg Oral BID  . vitamin B-12  1,000 mcg Oral Daily   Continuous Infusions: . sodium chloride Stopped (07/30/19 0345)  . diltiazem (CARDIZEM) infusion    . heparin 550 Units/hr (07/31/19 0942)  . piperacillin-tazobactam (ZOSYN)  IV 3.375 g (07/31/19 1254)     LOS: 4 days    Time spent: over 28 min    Fayrene Helper, MD  [facility]   To contact the attending provider between 7A-7P or the covering provider during after hours 7P-7A, please log into the web site [email or web address] and access using universal [place] password for that web site. If you do not have the password, please call the hospital operator.  07/31/2019, 6:54 PM

## 2019-07-31 NOTE — Progress Notes (Addendum)
Pharmacy Brief Note  86 y/o F on heparin drip for atrial fibrillation.   O: HL 0.13  A: Heparin now subtherapeutic after decreasing rate slightly due to slightly subtherapeutic heparin level with the last collection. Patient is a do not stick on left arm with heparin infusing in right forearm. Discussed with RN prior to collection of this most recent level that lab would need to collect the level "upstream" from IV site if possible. Not sure if previous levels were drawn incorrectly resulting in erroneous levels. No bleeding and infusion not paused other than briefly for lab collection per RN.   P: Will work with the assumption that previous levels were not drawn correctly and increase infusion back to 600 units/hr. Next level in 8 hours.   Ulice Dash, PharmD, BCPS

## 2019-07-31 NOTE — Progress Notes (Signed)
[place] for heparin Indication: atrial fibrillation  Allergies  Allergen Reactions  . Codeine Nausea Only    Patient Measurements: Height: 5\' 2"  (157.5 cm) Weight: 47.4 kg (104 lb 9.6 oz) IBW/kg (Calculated) : 50.1 Heparin Dosing Weight:   Vital Signs: Temp: 97.5 F (36.4 C) (06/22 0532) Temp Source: Oral (06/22 0532) BP: 128/78 (06/22 0532) Pulse Rate: 103 (06/22 0532)  Labs: Recent Labs    07/29/19 0606 07/29/19 0606 07/30/19 0518 07/30/19 2216 07/31/19 0746  HGB 10.7*   < > 10.8*  --  12.1  HCT 32.9*  --  32.1*  --  36.3  PLT 125*  --  160  --  194  HEPARINUNFRC  --   --   --  0.90* 0.71*  CREATININE 1.28*  --  1.04*  --  1.45*   < > = values in this interval not displayed.    Estimated Creatinine Clearance: 20.5 mL/min (A) (by C-G formula based on SCr of 1.45 mg/dL (H)).   Medical History: Past Medical History:  Diagnosis Date  . Arthritis    all over  . Cancer ([place])    breast  . Depression   . Diabetes mellitus without complication (HCC)    diet controlled  . Hypertension       Assessment: 86 y.o.femalewith medical history significant ofT2DM, HTN, depression, breast cancer, inflammatory diarrhea who presents with worsening symptoms of diarrhea.  Patient developed atrial fibrillation with RVR, denies any chest pain but notes some SOB.  Pharmacy consulted to dose heparin.  No prior AC noted.  07/31/2019  H/H WNL Plts WNL HL 0.71, slightly supra-therapeutic on 600 units/hr No bleeding per RN    Goal of Therapy:  Heparin level 0.3-0.7 units/ml Monitor platelets by anticoagulation protocol:   Plan:  Decrease heparin drip to 550 units/hr Heparin level in 8 hours Daily CBC  Dolly Rias RPh 07/31/2019, 8:53 AM

## 2019-07-31 NOTE — Evaluation (Signed)
Clinical/Bedside Swallow Evaluation Patient Details  Name: Deanna [name] MRN: [id number] Date of Birth: 10-31-1930  Today's Date: 07/31/2019 Time: SLP Start Time (ACUTE ONLY): 84 SLP Stop Time (ACUTE ONLY): 1632 SLP Time Calculation (min) (ACUTE ONLY): 31 min  Past Medical History:  Past Medical History:  Diagnosis Date  . Arthritis    all over  . Cancer ([place])    breast  . Depression   . Diabetes mellitus without complication (HCC)    diet controlled  . Hypertension    Past Surgical History:  Past Surgical History:  Procedure Laterality Date  . APPENDECTOMY     with lower back surgery  . BACK SURGERY     x2  . BREAST SURGERY Left 1995   complete left mastectomy  . CERVICAL SPINE SURGERY    . EYE SURGERY Bilateral    cataracts  . TONSILLECTOMY     at age 32   HPI:  86 yo female adm to [facility] with respiratory failure, pt has mesenteric mass - pulmonary edema and chf.  Swallow eval ordered.  Pt denies dysphagia.   Assessment / Plan / Recommendation Clinical Impression  Pt with functional oropharyngeal swallow based on clinical swallow evaluation. No clinical indications of airway compromise or dysphagia with po and CN exam unremarkable. Pt does report occasional issues swallowing large pills - with them lodging in the throat - pointing proximal. She also states she has to expectorate them on occasion. Advised pt take medications with puree (start and follow with liquids).  Daughter arrived during session and reviewed recommendations briefly with her. All education completed, no follow up.  Thanks. SLP Visit Diagnosis: Dysphagia, unspecified (R13.10)    Aspiration Risk  Mild aspiration risk    Diet Recommendation Regular;Thin liquid   Liquid Administration via: Cup;Straw Medication Administration:  (with puree if problematic) Supervision: Patient able to self feed Compensations: Slow rate;Small sips/bites (start intake with liquids d/t xerotomia) Postural Changes:  Seated upright at 90 degrees;Remain upright for at least 30 minutes after po intake    Other  Recommendations Oral Care Recommendations: Oral care BID   Follow up Recommendations None      Frequency and Duration     n/a       Prognosis   n/a    Swallow Study   General Date of Onset: 07/31/19 HPI: 86 yo female adm to [facility] with respiratory failure, pt has mesenteric mass - pulmonary edema and chf.  Swallow eval ordered.  Pt denies dysphagia. Type of Study: Bedside Swallow Evaluation Diet Prior to this Study: Dysphagia 3 (soft);Thin liquids Temperature Spikes Noted: No Respiratory Status: Room air History of Recent Intubation: No Behavior/Cognition: Alert;Cooperative;Pleasant mood Oral Cavity Assessment: Within Functional Limits Oral Care Completed by SLP: No Oral Cavity - Dentition: Adequate natural dentition Vision: Functional for self-feeding Self-Feeding Abilities: Able to feed self Patient Positioning: Upright in bed Baseline Vocal Quality: Normal Volitional Cough: Strong Volitional Swallow: Able to elicit    Oral/Motor/Sensory Function Overall Oral Motor/Sensory Function: Within functional limits   Ice Chips Ice chips: Not tested   Thin Liquid Thin Liquid: Within functional limits Presentation: Cup;Self Fed;Straw    Nectar Thick Nectar Thick Liquid: Not tested   Honey Thick Honey Thick Liquid: Not tested   Puree Puree: Not tested   Solid     Solid: Within functional limits Presentation: Self Fed      Deanna [name] 07/31/2019,5:51 PM   Deanna Lime, MS [place] Office [phone]

## 2019-07-31 NOTE — TOC Initial Note (Signed)
Transition of Care [facility]) - Initial/Assessment Note    Patient Details  Name: Deanna [name] MRN: [id number] Date of Birth: February 20, 1930  [facility]) CM/SW Contact:    Dessa Phi, RN Phone Number: 07/31/2019, 12:57 PM  Clinical Narrative:From [employer]. PT recc HHPT-facility has own HHPT spoke to [facility] of returning back.Patient/dtr in agreement to d/c back. Will have own transport.                   Expected Discharge Plan: [place] Barriers to Discharge: Continued Medical Work up   Patient Goals and CMS Choice Patient states their goals for this hospitalization and ongoing recovery are:: go home CMS Medicare.gov Compare Post Acute Care list provided to:: Patient Choice offered to / list presented to : Patient  Expected Discharge Plan and Services Expected Discharge Plan: [place]   Discharge Planning Services: CM Consult   Living arrangements for the past 2 months: [place]                           HH Arranged: PT [facility] Agency: Other - See comment ([facility] has own HHPT) Date HH Agency Contacted: 07/31/19 Time [place]: [id number] Representative spoke with at [place]: [place] Arrangements/Services Living arrangements for the past 2 months: Materials engineer Lives with:: Self Patient language and need for interpreter reviewed:: Yes Do you feel safe going back to the place where you live?: Yes      Need for Family Participation in Patient Care: No (Comment) Care giver support system in place?: Yes (comment) Current home services: DME (cane, rw) Criminal Activity/Legal Involvement Pertinent to Current Situation/Hospitalization: No - Comment as needed  Activities of Daily Living Home Assistive Devices/Equipment: Cane (specify quad or straight), Walker (specify type) ADL Screening (condition at  time of admission) Patient's cognitive ability adequate to safely complete daily activities?: Yes Is the patient deaf or have difficulty hearing?: No Does the patient have difficulty seeing, even when wearing glasses/contacts?: Yes Does the patient have difficulty concentrating, remembering, or making decisions?: No Patient able to express need for assistance with ADLs?: Yes Does the patient have difficulty dressing or bathing?: No Independently performs ADLs?: Yes (appropriate for developmental age) Does the patient have difficulty walking or climbing stairs?: Yes Weakness of Legs: Both Weakness of Arms/Hands: None  Permission Sought/Granted Permission sought to share information with : Case Manager Permission granted to share information with : Yes, Verbal Permission Granted  Share Information with NAME: Case Manager  Permission granted to share info w AGENCY: [employer] granted to share info w Relationship: Bella Kennedy dtr [phone]     Emotional Assessment Appearance:: Appears stated age Attitude/Demeanor/Rapport: Gracious Affect (typically observed): Accepting Orientation: : Oriented to Self, Oriented to Place, Oriented to  Time, Oriented to Situation Alcohol / Substance Use: Not Applicable Psych Involvement: No (comment)  Admission diagnosis:  Dehydration [E86.0] Colitis [K52.9] Sepsis without acute organ dysfunction, due to unspecified organism [facility]) [A41.9] Patient Active Problem List   Diagnosis Date Noted  . Atrial fibrillation with RVR ([place]) 07/30/2019  . Colitis 07/27/2019  . Type 2 macular telangiectasis, left 07/26/2019  . Retinal hemorrhage of left eye 07/26/2019  . Type 2 macular telangiectasis, right 07/26/2019  . Vitreomacular adhesion of right eye 07/26/2019  . Vitreomacular adhesion of left eye 07/26/2019  .  Loss of appetite 09/23/2016  . AKI (acute kidney injury) ([place]) 09/23/2016  . Orthostasis 09/23/2016  . Hyponatremia  09/23/2016  . Leukocytosis 09/23/2016  . Normocytic anemia 09/23/2016  . Depression with anxiety 09/23/2016  . Diabetes mellitus type II, non insulin dependent ([place]) 09/23/2016  . Transaminasemia 09/23/2016  . Dehydration   . Elevated LFTs   . Chronic midline low back pain without sciatica 01/13/2016   PCP:  Marton Redwood, MD Pharmacy:   [facility] - [address] [place] [address] Phone: [phone] Fax: [phone]  [place], [place] [address] [place] [address] Phone: [phone] Fax: [phone]     Social Determinants of Health (SDOH) Interventions    Readmission Risk Interventions No flowsheet data found.

## 2019-07-31 NOTE — Progress Notes (Signed)
*  PRELIMINARY RESULTS* Echocardiogram 2D Echocardiogram has been performed.  Deanna [name] 07/31/2019, 11:07 AM

## 2019-08-01 ENCOUNTER — Encounter (HOSPITAL_COMMUNITY): Payer: Self-pay | Admitting: Family Medicine

## 2019-08-01 DIAGNOSIS — A419 Sepsis, unspecified organism: Secondary | ICD-10-CM

## 2019-08-01 DIAGNOSIS — I4891 Unspecified atrial fibrillation: Secondary | ICD-10-CM

## 2019-08-01 DIAGNOSIS — K529 Noninfective gastroenteritis and colitis, unspecified: Secondary | ICD-10-CM

## 2019-08-01 DIAGNOSIS — J9601 Acute respiratory failure with hypoxia: Secondary | ICD-10-CM

## 2019-08-01 LAB — CBC
HCT: 36.6 % (ref 36.0–46.0)
Hemoglobin: 12.1 g/dL (ref 12.0–15.0)
MCH: 27.8 pg (ref 26.0–34.0)
MCHC: 33.1 g/dL (ref 30.0–36.0)
MCV: 84.1 fL (ref 80.0–100.0)
Platelets: 215 10*3/uL (ref 150–400)
RBC: 4.35 MIL/uL (ref 3.87–5.11)
RDW: 14.6 % (ref 11.5–15.5)
WBC: 18.9 10*3/uL — ABNORMAL HIGH (ref 4.0–10.5)
nRBC: 0 % (ref 0.0–0.2)

## 2019-08-01 LAB — HEPARIN LEVEL (UNFRACTIONATED)
Heparin Unfractionated: 0.33 IU/mL (ref 0.30–0.70)
Heparin Unfractionated: 0.2 IU/mL — ABNORMAL LOW (ref 0.30–0.70)
Heparin Unfractionated: 0.44 IU/mL (ref 0.30–0.70)

## 2019-08-01 LAB — COMPREHENSIVE METABOLIC PANEL
ALT: 16 U/L (ref 0–44)
AST: 13 U/L — ABNORMAL LOW (ref 15–41)
Albumin: 3.3 g/dL — ABNORMAL LOW (ref 3.5–5.0)
Alkaline Phosphatase: 55 U/L (ref 38–126)
Anion gap: 12 (ref 5–15)
BUN: 41 mg/dL — ABNORMAL HIGH (ref 8–23)
CO2: 25 mmol/L (ref 22–32)
Calcium: 9.2 mg/dL (ref 8.9–10.3)
Chloride: 103 mmol/L (ref 98–111)
Creatinine, Ser: 1.7 mg/dL — ABNORMAL HIGH (ref 0.44–1.00)
GFR calc Af Amer: 31 mL/min — ABNORMAL LOW (ref >60)
GFR calc non Af Amer: 27 mL/min — ABNORMAL LOW (ref >60)
Glucose, Bld: 197 mg/dL — ABNORMAL HIGH (ref 70–99)
Potassium: 3.1 mmol/L — ABNORMAL LOW (ref 3.5–5.1)
Sodium: 140 mmol/L (ref 135–145)
Total Bilirubin: 0.7 mg/dL (ref 0.3–1.2)
Total Protein: 6.8 g/dL (ref 6.5–8.1)

## 2019-08-01 LAB — GLUCOSE, CAPILLARY
Glucose-Capillary: 160 mg/dL — ABNORMAL HIGH (ref 70–99)
Glucose-Capillary: 200 mg/dL — ABNORMAL HIGH (ref 70–99)
Glucose-Capillary: 347 mg/dL — ABNORMAL HIGH (ref 70–99)
Glucose-Capillary: 162 mg/dL — ABNORMAL HIGH (ref 70–99)

## 2019-08-01 LAB — CULTURE, BLOOD (ROUTINE X 2)
Culture: NO GROWTH
Culture: NO GROWTH
Special Requests: ADEQUATE

## 2019-08-01 LAB — MAGNESIUM: Magnesium: 1.5 mg/dL — ABNORMAL LOW (ref 1.7–2.4)

## 2019-08-01 LAB — PHOSPHORUS: Phosphorus: 2.9 mg/dL (ref 2.5–4.6)

## 2019-08-01 MED ORDER — PEG-KCL-NACL-NASULF-NA ASC-C 100 G PO SOLR
0.5000 | Freq: Once | ORAL | Status: AC
  Administered 2019-08-02: 100 g via ORAL

## 2019-08-01 MED ORDER — PEG-KCL-NACL-NASULF-NA ASC-C 100 G PO SOLR
0.5000 | Freq: Once | ORAL | Status: AC
  Administered 2019-08-01: 100 g via ORAL
  Filled 2019-08-01: qty 1

## 2019-08-01 MED ORDER — PEG-KCL-NACL-NASULF-NA ASC-C 100 G PO SOLR: 1.0000 | Freq: Once | ORAL | Status: DC

## 2019-08-01 MED ORDER — DILTIAZEM HCL ER COATED BEADS 120 MG PO CP24
120.0000 mg | ORAL_CAPSULE | Freq: Every day | ORAL | Status: DC
  Administered 2019-08-01 – 2019-08-05 (×5): 120 mg via ORAL
  Filled 2019-08-01 (×5): qty 1

## 2019-08-01 MED ORDER — PIPERACILLIN-TAZOBACTAM IN DEX 2-0.25 GM/50ML IV SOLN
2.2500 g | Freq: Four times a day (QID) | INTRAVENOUS | Status: DC
  Administered 2019-08-01 – 2019-08-02 (×3): 2.25 g via INTRAVENOUS
  Filled 2019-08-01 (×4): qty 50

## 2019-08-01 MED ORDER — VITAMIN E 180 MG (400 UNIT) PO CAPS
400.0000 [IU] | ORAL_CAPSULE | Freq: Two times a day (BID) | ORAL | Status: DC
  Administered 2019-08-02 – 2019-08-06 (×9): 400 [IU] via ORAL
  Filled 2019-08-01 (×10): qty 1

## 2019-08-01 NOTE — Care Management (Signed)
Transition of Care (TOC) -30 day Note       Patient Details  Name:Deanna [name] MVV:[id number] Date of Birth:12/13/1930   Transition of Care [facility]) CM/SW Contact  Name:Taneasha Fuqua Moody  Phone Number:[phone] Date:08/01/2019 Time:   MUST ID: [id number]   To Whom it May Concern:   Please be advised that the above patient will require a short-term nursing home stay, anticipated 30 days or less rehabilitation and strengthening. The plan is for return home

## 2019-08-01 NOTE — H&P (View-Only) (Signed)
Cardiology evaluated patient earlier today. They are okay with proceeding with colonoscopy as long as heart rate controlled. Heart rate has been controlled to day. I tried to call daughter to discuss proceeding with colonoscopy tomorrow but unable to reach her. Will prep for colonoscopy to be done tomorrow. Will need to to hold IV heparin 4 hours prior to procedure. Endo can do procedure at 11am, hold heparin at 7 am.

## 2019-08-01 NOTE — Consult Note (Addendum)
Cardiology Consultation:   Patient ID: LILAS DIEFENDORF MRN: [id number]; DOB: 1930/03/24  Admit date: 07/27/2019 Date of Consult: 08/01/2019  Primary Care Provider: Marton Redwood, MD [facility] Cardiologist: No primary care provider on file. New Dr. Oval Linsey [facility] Electrophysiologist:  None    Patient Profile:   BRIENNA BASS is a 86 y.o. female with a hx of chronic LBBB, cardiac cath 2006 with no CAD, normal LV function, DM-2 + pulmonary HTN, on echo 2018 with admit for HF, sepsis, AKI and UTI and now admitted 07/27/19 with Sepsis/acute colitis/AKI who is being seen today for the evaluation of atrial fib at the request of Dr. Lonny Prude.  History of Present Illness:   Ms. Kriegel last saw cardiology in 2006 with cath for chest pain and LBBB. She had normal coronary arteries on cath and normal EF.  She has had admits for sepsis with AKI in 2018.  Now admitted on 07/27/19 after being too weak to go to MD appointment.  Found to have acute colitis/diarrhea, AKI, mass of mid mesentery, small Rt pleural effusion, DM-2, peripheral neuropathy, HTN and HLD.  She was in SR on admit with LBBB.    GI has seen and steroid taper added.  The mesenteric mass will be followed by primary GI Dr. Earlean Shawl.    Her po meds had been held on admit and resumed amlodipine and coreg on the 20th.    On the 21st early AM  pt went into a fib RVR.  Also SOB,  no chest pain.  + CHF IV dilt started and and IV lasix given.  She did better until last evening she developed rapid a fib and IV BB given.    She is on IV heparin -CHA2DS2VASc of 5 at least,  Daughter concerned about anticoagulation with bleeding risk and falls, though has not had fall requiring hospitalization. She has had 2-3 sec pauses in her a fib.        EKG:  The EKG was personally reviewed and demonstrates:  07/27/19 SR with LBBB and no acute changes with ST 07/30/19 a fib with RVR and LBBB only change from prior is a fib and rate 08/01/19 a fib wit  rate control in 80s and LBBB another with HR in 60s  Telemetry:  Telemetry was personally reviewed and demonstrates:  Atrial fib mostly rate controlled   Na 140, K+ 3.1 BUN 41, Cr 1.70 Mg+ 1.5 GFR 4  Cr on arrival 1.77   Did decrease to 1.04 , now 1.70   ( in 2020 1.63 and 2018 was 1.34 to 1.55)     WBC 15.3 and Hgb 12.1 plts 194  BNP on the 21st 1034 and today 874 TSH 0.845 hgb A1C of 6.4  CXR -portable today  IMPRESSION: Interval improvement in interstitial edema.  Echo 07/31/19 with EF 60-65% with LVH   She is neg 3692 today and wt down from 50 Kg on admit to 46.8 Kg after 4 doses of lasix 40 mg.   Currently BP 111/58 and p 74 - she does have bradycardia at times but for 1-2 minutes.  Pt's daughter is in room as well.  Normally pt has episodes of weakness and will fall into wallor fall to floor and no serious injury.  They tell me it is associated with balance issues she has and orthostatic hypotension.  She lives in independent living with her cat.    Past Medical History:  Diagnosis Date  . Arthritis    all  over  . Cancer [facility])    breast  . Depression   . Diabetes mellitus without complication (HCC)    diet controlled  . Hypertension     Past Surgical History:  Procedure Laterality Date  . APPENDECTOMY     with lower back surgery  . BACK SURGERY     x2  . BREAST SURGERY Left 1995   complete left mastectomy  . CERVICAL SPINE SURGERY    . EYE SURGERY Bilateral    cataracts  . TONSILLECTOMY     at age 45     Home Medications:  Prior to Admission medications   Medication Sig Start Date End Date Taking? Authorizing Provider  acetaminophen (TYLENOL) 325 MG tablet Take 650 mg by mouth 2 (two) times a day.    Yes [provider]  amLODipine-valsartan (EXFORGE) 5-160 MG tablet Take 1 tablet by mouth daily.   Yes [provider]  aspirin EC 81 MG tablet Take 81 mg by mouth at bedtime.    Yes [provider]  buPROPion (WELLBUTRIN XL) 150 MG  24 hr tablet Take 150 mg by mouth daily.   Yes [provider]  carvedilol (COREG) 12.5 MG tablet Take 12.5 mg by mouth 2 (two) times daily with a meal.  01/07/16  Yes [provider]  Cyanocobalamin (VITAMIN B 12 PO) Take 1,000 mcg by mouth daily.   Yes [provider]  cyclobenzaprine (FLEXERIL) 5 MG tablet Take 5 mg by mouth every 8 (eight) hours as needed for muscle spasms.   Yes [provider]  denosumab (PROLIA) 60 MG/ML SOLN injection Inject 60 mg into the skin every 6 (six) months. Administer in upper arm, thigh, or abdomen   Yes [provider]  diclofenac Sodium (VOLTAREN) 1 % GEL Apply 2 g topically 4 (four) times daily as needed (painful joints).   Yes [provider]  escitalopram (LEXAPRO) 10 MG tablet Take 10 mg by mouth daily. 04/12/18  Yes [provider]  esomeprazole (NEXIUM) 40 MG capsule Take 40 mg by mouth daily at 12 noon.   Yes [provider]  hydrochlorothiazide (MICROZIDE) 12.5 MG capsule Take 12.5 mg by mouth daily.   Yes [provider]  loperamide (IMODIUM A-D) 2 MG tablet Take 2 mg by mouth 4 (four) times daily as needed for diarrhea or loose stools.   Yes [provider]  LYRICA 150 MG capsule Take 150 mg by mouth 2 (two) times daily.  11/03/15  Yes [provider]  mesalamine (LIALDA) 1.2 g EC tablet Take 2.4 g by mouth daily with breakfast.   Yes [provider]  metFORMIN (GLUCOPHAGE) 500 MG tablet Take 500 mg by mouth daily with breakfast.   Yes [provider]  MYRBETRIQ 50 MG TB24 tablet Take 50 mg by mouth at bedtime.  10/21/15  Yes [provider]  Probiotic Product (PROBIOTIC-10 PO) Take 1 capsule by mouth daily.   Yes [provider]  rosuvastatin (CRESTOR) 10 MG tablet Take 10 mg by mouth at bedtime.    Yes [provider]  SYMAX-SR 0.375 MG 12 hr tablet Take 0.375 mg by mouth every 12 (twelve) hours as needed for pain.  05/10/19  Yes [provider]  traMADol (ULTRAM) 50 MG tablet Take 50 mg by mouth every 8 (eight) hours as needed for moderate pain.   Yes [provider]  VITAMIN D, CHOLECALCIFEROL, PO Take 400 Units by mouth daily.    Yes [provider]  vitamin E 400 UNIT capsule Take 400 Units by mouth 2 (two) times a day.    Yes [provider]  cephALEXin (KEFLEX) 500 MG capsule Take 1 capsule (500 mg total) by mouth 3 (three) times daily. Patient not taking: Reported on 09/13/2018 06/28/18   Julianne Rice, MD  feeding supplement, ENSURE ENLIVE, (ENSURE ENLIVE) LIQD Take 237 mLs by mouth 3 (three) times daily between meals. Patient not taking: Reported on 07/27/2019 09/28/16   Arrien, Jimmy Picket, MD  predniSONE (STERAPRED UNI-PAK 21 TAB) 5 MG (21) TBPK tablet Take as instructed with food. 6,5,4,3,2,1 stop Patient not taking: Reported on 07/27/2019 02/20/19   Marybelle Killings, MD    Inpatient Medications: Scheduled Meds: . buPROPion  150 mg Oral Daily  . carvedilol  25 mg Oral BID WC  . Chlorhexidine Gluconate Cloth  6 each Topical Daily  . escitalopram  10 mg Oral Daily  . ferrous sulfate  325 mg Oral Q breakfast  . insulin aspart  0-5 Units Subcutaneous QHS  . insulin aspart  0-9 Units Subcutaneous TID WC  . insulin aspart  2 Units Subcutaneous TID WC  . insulin glargine  5 Units Subcutaneous Daily  . loperamide  2 mg Oral QID  . mesalamine  2.4 g Oral Q breakfast  . methylPREDNISolone (SOLU-MEDROL) injection  40 mg Intravenous Daily  . mirabegron ER  50 mg Oral QHS  . pantoprazole  40 mg Oral Daily  . pregabalin  75 mg Oral BID  . vitamin B-12  1,000 mcg Oral Daily   Continuous Infusions: . sodium chloride 10 mL/hr at 08/01/19 0600  . diltiazem (CARDIZEM) infusion 5 mg/hr (08/01/19 0600)  . heparin 600 Units/hr (08/01/19 0600)  . piperacillin-tazobactam (ZOSYN)  IV 3.375 g (08/01/19 0319)   PRN Meds: sodium chloride, acetaminophen **OR** acetaminophen,  labetalol, metoprolol tartrate, morphine injection, ondansetron **OR** ondansetron (ZOFRAN) IV, oxyCODONE  Allergies:    Allergies  Allergen Reactions  . Codeine Nausea Only    Social History:   Social History   Socioeconomic History  . Marital status: Married    Spouse name: Not on file  . Number of children: Not on file  . Years of education: Not on file  . Highest education level: Not on file  Occupational History  . Not on file  Tobacco Use  . Smoking status: Never Smoker  . Smokeless tobacco: Never Used  Vaping Use  . Vaping Use: Never used  Substance and Sexual Activity  . Alcohol use: Never  . Drug use: Never  . Sexual activity: Not on file  Other Topics Concern  . Not on file  Social History Narrative  . Not on file   Social Determinants of Health   Financial Resource Strain:   . Difficulty of Paying Living Expenses:   Food Insecurity:   . Worried About Charity fundraiser in the Last Year:   . Arboriculturist in the Last Year:   Transportation Needs:   . Film/video editor (Medical):   Marland Kitchen Lack of Transportation (Non-Medical):   Physical Activity:   . Days of Exercise per Week:   . Minutes of Exercise per Session:   Stress:   . Feeling of Stress :   Social Connections:   . Frequency of Communication with Friends and Family:   . Frequency of Social Gatherings with Friends and Family:   . Attends Religious Services:   . Active Member of Clubs or Organizations:   .  Attends Archivist Meetings:   Marland Kitchen Marital Status:   Intimate Partner Violence:   . Fear of Current or Ex-Partner:   . Emotionally Abused:   Marland Kitchen Physically Abused:   . Sexually Abused:     Family History:    Family History  Problem Relation Age of Onset  . Hypertension Mother   . Heart disease Mother   . Hypertension Father   . Heart disease Father      ROS:  Please see the history of present illness.  General:no colds or fevers, no weight changes Skin:no rashes or  ulcers HEENT:no blurred vision, no congestion CV:see HPI PUL:see HPI GI:+ diarrhea no constipation or melena, no indigestion, admitted with abd pain. GU:no hematuria, no dysuria MS:+ back pain, no claudication Neuro:no syncope, no lightheadedness, falls due to balance issues and orthostatic hypotension. Endo:+ diabetes, no thyroid disease  All other ROS reviewed and negative.     Physical Exam/Data:   Vitals:   07/31/19 2104 08/01/19 0030 08/01/19 0430 08/01/19 0452  BP:  107/73 108/63   Pulse:  78 84   Resp:  14 (!) 23   Temp: 98 F (36.7 C)  98.4 F (36.9 C)   TempSrc: Oral  Oral   SpO2:  95% 97%   Weight:    46.8 kg  Height:        Intake/Output Summary (Last 24 hours) at 08/01/2019 1148 Last data filed at 08/01/2019 1000 Gross per 24 hour  Intake 719.71 ml  Output 1775 ml  Net -1055.29 ml   Last 3 Weights 08/01/2019 07/31/2019 07/30/2019  Weight (lbs) 103 lb 3.2 oz 104 lb 9.6 oz 105 lb 14.4 oz  Weight (kg) 46.811 kg 47.446 kg 48.036 kg     Body mass index is 18.88 kg/m.  General:  Frail female, in no acute distress had recently walked with cardiac rehab HEENT: normal Lymph: no adenopathy Neck: no JVD Endocrine:  No thryomegaly Vascular: No carotid bruits; pedal pulses 2+ bilaterally  Cardiac:  irreg irreg; no murmur gallup rub or click Lungs:  clear to auscultation bilaterally, no wheezing, rhonchi or rales  Abd: soft, nontender, no hepatomegaly  Ext: no edema Musculoskeletal:  No deformities, BUE and BLE strength normal and equal Skin: warm and dry  Neuro:  Alert and oriented X 3 MAE follows commands, no focal abnormalities noted Psych:  Normal affect   Relevant CV Studies: Echo 07/31/19 IMPRESSIONS    1. Left ventricular ejection fraction, by estimation, is 60 to 65%. The  left ventricle has normal function. The left ventricle has no regional  wall motion abnormalities. There is moderate concentric left ventricular  hypertrophy. Left ventricular    diastolic parameters are indeterminate. Elevated left ventricular  end-diastolic pressure.  2. Right ventricular systolic function is normal. The right ventricular  size is normal. There is mildly elevated pulmonary artery systolic  pressure. The estimated right ventricular systolic pressure is 10.9 mmHg.  3. The mitral valve is normal in structure. Mild mitral valve  regurgitation. No evidence of mitral stenosis.  4. Tricuspid valve regurgitation is moderate.  5. The aortic valve is tricuspid. Aortic valve regurgitation is not  visualized. Mild to moderate aortic valve sclerosis/calcification is  present, without any evidence of aortic stenosis.  6. The inferior vena cava is normal in size with greater than 50%  respiratory variability, suggesting right atrial pressure of 3 mmHg.   FINDINGS  Left Ventricle: Left ventricular ejection fraction, by estimation, is 60  to 65%. The  left ventricle has normal function. The left ventricle has no  regional wall motion abnormalities. The left ventricular internal cavity  size was normal in size. There is  moderate concentric left ventricular hypertrophy. Left ventricular  diastolic parameters are indeterminate. Elevated left ventricular  end-diastolic pressure.   Right Ventricle: The right ventricular size is normal. No increase in  right ventricular wall thickness. Right ventricular systolic function is  normal. There is mildly elevated pulmonary artery systolic pressure. The  tricuspid regurgitant velocity is 3.08  m/s, and with an assumed right atrial pressure of 3 mmHg, the estimated  right ventricular systolic pressure is 29.9 mmHg.   Left Atrium: Left atrial size was normal in size.   Right Atrium: Right atrial size was normal in size.   Pericardium: There is no evidence of pericardial effusion.   Mitral Valve: The mitral valve is normal in structure. There is mild  calcification of the mitral valve leaflet(s). Normal  mobility of the  mitral valve leaflets. Mild to moderate mitral annular calcification. Mild  mitral valve regurgitation. No evidence  of mitral valve stenosis.   Tricuspid Valve: The tricuspid valve is normal in structure. Tricuspid  valve regurgitation is moderate . No evidence of tricuspid stenosis.   Aortic Valve: The aortic valve is tricuspid. Aortic valve regurgitation is  not visualized. Mild to moderate aortic valve sclerosis/calcification is  present, without any evidence of aortic stenosis.   Pulmonic Valve: The pulmonic valve was normal in structure. Pulmonic valve  regurgitation is not visualized. No evidence of pulmonic stenosis.   Aorta: The aortic root is normal in size and structure.   Venous: The inferior vena cava is normal in size with greater than 50%  respiratory variability, suggesting right atrial pressure of 3 mmHg.   Laboratory Data:  High Sensitivity Troponin:  No results for input(s): TROPONINIHS in the last 720 hours.   Chemistry Recent Labs  Lab 07/30/19 0518 07/31/19 0746 08/01/19 0425  NA 136 141 140  K 3.5 3.1* 3.1*  CL 105 102 103  CO2 20* 23 25  GLUCOSE 190* 197* 197*  BUN 21 30* 41*  CREATININE 1.04* 1.45* 1.70*  CALCIUM 9.7 9.5 9.2  GFRNONAA 48* 32* 27*  GFRAA 56* 37* 31*  ANIONGAP 11 16* 12    Recent Labs  Lab 07/30/19 0518 07/31/19 0746 08/01/19 0425  PROT 7.3 7.4 6.8  ALBUMIN 3.3* 3.3* 3.3*  AST 20 14* 13*  ALT 20 16 16   ALKPHOS 66 61 55  BILITOT 0.8 1.0 0.7   Hematology Recent Labs  Lab 07/30/19 0518 07/31/19 0746 08/01/19 0425  WBC 17.3* 15.3* 18.9*  RBC 3.83* 4.33 4.35  HGB 10.8* 12.1 12.1  HCT 32.1* 36.3 36.6  MCV 83.8 83.8 84.1  MCH 28.2 27.9 27.8  MCHC 33.6 33.3 33.1  RDW 14.7 14.5 14.6  PLT 160 194 215   BNP Recent Labs  Lab 07/30/19 1604 07/31/19 0746  BNP 1,034.6* 874.8*    DDimer No results for input(s): DDIMER in the last 168 hours.   Radiology/Studies:  DG CHEST PORT 1 VIEW  Result  Date: 07/31/2019 CLINICAL DATA:  Shortness of breath.  History of breast cancer. EXAM: PORTABLE CHEST 1 VIEW COMPARISON:  07/31/2019 FINDINGS: Lower cervical plate and screw fixator. Left axillary clips noted along with left mastectomy. Atherosclerotic calcification of the aortic arch. The lungs appear clear. Levoconvex thoracolumbar scoliosis which may be positional. Heart size within normal limits. The patient is rotated to the right on today's radiograph,  reducing diagnostic sensitivity and specificity. IMPRESSION: 1. No acute findings. 2.  Aortic Atherosclerosis (ICD10-I70.0). Electronically Signed   By: Van Clines M.D.   On: 07/31/2019 19:01   DG CHEST PORT 1 VIEW  Result Date: 07/31/2019 CLINICAL DATA:  Hypoxia. EXAM: PORTABLE CHEST 1 VIEW COMPARISON:  07/30/2019 FINDINGS: Normal heart size. No pleural effusion. Interval improvement in interstitial edema. No airspace consolidation identified. Previous ACDF within the lower cervical spine. IMPRESSION: Interval improvement in interstitial edema. Electronically Signed   By: Kerby Moors M.D.   On: 07/31/2019 09:15   DG CHEST PORT 1 VIEW  Result Date: 07/30/2019 CLINICAL DATA:  Acute respiratory failure with hypoxia. EXAM: PORTABLE CHEST 1 VIEW COMPARISON:  Radiograph 3 days ago 07/27/2019. FINDINGS: Lower lung volumes from prior exam. There are development of bilateral infrahilar opacities since prior exam. Possible septal thickening/Kerley B-lines peripherally. Possible small right pleural effusion. Unchanged heart size and mediastinal contours. Aortic atherosclerosis. No pneumothorax. IMPRESSION: 1. Development of bilateral infrahilar opacities which may represent pulmonary edema or pneumonia, including aspiration. Presence of septal thickening favors pulmonary edema. 2. Possible small right pleural effusion. Electronically Signed   By: Keith Rake M.D.   On: 07/30/2019 03:36   ECHOCARDIOGRAM COMPLETE  Result Date: 07/31/2019     ECHOCARDIOGRAM REPORT   Patient Name:   RASHAWNA SCOLES Date of Exam: 07/31/2019 Medical Rec #:  [id number]         Height:       62.0 in Accession #:    [id number]        Weight:       104.6 lb Date of Birth:  1930-09-17         BSA:          1.451 m Patient Age:    50 years          BP:           128/78 mmHg Patient Gender: F                 HR:           103 bpm. Exam Location:  Inpatient Procedure: 2D Echo, Cardiac Doppler and Color Doppler Indications:    Atrial fibrillation  History:        Patient has prior history of Echocardiogram examinations, most                 recent 09/29/2016. Arrythmias:Atrial Fibrillation; Risk                 Factors:Diabetes and Hypertension. Sepsis, Breast cancer.  Sonographer:    Dustin Flock Referring Phys: [phone] A CALDWELL Yettem  1. Left ventricular ejection fraction, by estimation, is 60 to 65%. The left ventricle has normal function. The left ventricle has no regional wall motion abnormalities. There is moderate concentric left ventricular hypertrophy. Left ventricular diastolic parameters are indeterminate. Elevated left ventricular end-diastolic pressure.  2. Right ventricular systolic function is normal. The right ventricular size is normal. There is mildly elevated pulmonary artery systolic pressure. The estimated right ventricular systolic pressure is 54.6 mmHg.  3. The mitral valve is normal in structure. Mild mitral valve regurgitation. No evidence of mitral stenosis.  4. Tricuspid valve regurgitation is moderate.  5. The aortic valve is tricuspid. Aortic valve regurgitation is not visualized. Mild to moderate aortic valve sclerosis/calcification is present, without any evidence of aortic stenosis.  6. The inferior vena cava is normal in size with greater than 50% respiratory variability,  suggesting right atrial pressure of 3 mmHg. FINDINGS  Left Ventricle: Left ventricular ejection fraction, by estimation, is 60 to 65%. The left ventricle has  normal function. The left ventricle has no regional wall motion abnormalities. The left ventricular internal cavity size was normal in size. There is  moderate concentric left ventricular hypertrophy. Left ventricular diastolic parameters are indeterminate. Elevated left ventricular end-diastolic pressure. Right Ventricle: The right ventricular size is normal. No increase in right ventricular wall thickness. Right ventricular systolic function is normal. There is mildly elevated pulmonary artery systolic pressure. The tricuspid regurgitant velocity is 3.08  m/s, and with an assumed right atrial pressure of 3 mmHg, the estimated right ventricular systolic pressure is 78.6 mmHg. Left Atrium: Left atrial size was normal in size. Right Atrium: Right atrial size was normal in size. Pericardium: There is no evidence of pericardial effusion. Mitral Valve: The mitral valve is normal in structure. There is mild calcification of the mitral valve leaflet(s). Normal mobility of the mitral valve leaflets. Mild to moderate mitral annular calcification. Mild mitral valve regurgitation. No evidence of mitral valve stenosis. Tricuspid Valve: The tricuspid valve is normal in structure. Tricuspid valve regurgitation is moderate . No evidence of tricuspid stenosis. Aortic Valve: The aortic valve is tricuspid. Aortic valve regurgitation is not visualized. Mild to moderate aortic valve sclerosis/calcification is present, without any evidence of aortic stenosis. Pulmonic Valve: The pulmonic valve was normal in structure. Pulmonic valve regurgitation is not visualized. No evidence of pulmonic stenosis. Aorta: The aortic root is normal in size and structure. Venous: The inferior vena cava is normal in size with greater than 50% respiratory variability, suggesting right atrial pressure of 3 mmHg. IAS/Shunts: No atrial level shunt detected by color flow Doppler.  LEFT VENTRICLE PLAX 2D LVIDd:         2.70 cm  Diastology LVIDs:         1.90 cm   LV e' lateral:   4.64 cm/s LV PW:         1.30 cm  LV E/e' lateral: 18.2 LV IVS:        1.30 cm  LV e' medial:    3.77 cm/s LVOT diam:     1.70 cm  LV E/e' medial:  22.4 LV SV:         41 LV SV Index:   28 LVOT Area:     2.27 cm  RIGHT VENTRICLE RV Basal diam:  2.90 cm RV S prime:     10.60 cm/s TAPSE (M-mode): 1.9 cm LEFT ATRIUM             Index       RIGHT ATRIUM           Index LA diam:        4.10 cm 2.82 cm/m  RA Area:     12.40 cm LA Vol (A2C):   34.1 ml 23.49 ml/m RA Volume:   29.40 ml  20.26 ml/m LA Vol (A4C):   60.4 ml 41.62 ml/m LA Biplane Vol: 49.6 ml 34.17 ml/m  AORTIC VALVE LVOT Vmax:   95.30 cm/s LVOT Vmean:  71.600 cm/s LVOT VTI:    0.180 m  AORTA Ao Root diam: 2.50 cm MITRAL VALVE               TRICUSPID VALVE MV Area (PHT): 5.54 cm    TR Peak grad:   37.9 mmHg MV Decel Time: 137 msec    TR Vmax:  308.00 cm/s MV E velocity: 84.40 cm/s                            SHUNTS                            Systemic VTI:  0.18 m                            Systemic Diam: 1.70 cm Fransico Him MD Electronically signed by Fransico Him MD Signature Date/Time: 07/31/2019/1:36:53 PM    Final          NO CHEST PAIN   Assessment and Plan:   1. Atrial fib with RVR at times.  No chest pain, no prior hx of atrial fib. No acute echo changes.  Was in SR on admit. May be related to acute illness and HF/steroids.  TSH is WNL. On coreg 25 mg BID,  (was on 12.5 BID at home)  CHA2DS2VASc of 5 on IV heparin currently, daughter is concerned about anticoagulation as outpt. Possible continue anticoag and bring back 3-4 weeks for DCCV then stop anticoag in 4 weeks post DCCV.  Though pt is not aware of her atrial fib.  Initially was SOB on the 21st but even then did not know HR was up.  Currently on IV dilt.   2. Acute diastolic HF combined with atrial fib. She is neg 3L after diuresing with lasix.  CXR improved. Wt is down.  HF may also be due to steroids and Sepsis in addition to tachycardia 3. Hx of patent  Coronary arteries in 2006 .  No chest pain with this admit or prior to admit 4. Sepsis/acute colitis/hx of inflammatory diarrhea 5. AKI up and down - but baseline 1.3 to 1.6 and I suspect closer to 1.6.  Today 1.7 but lasix stopped for now -appears euvolemic currently 6. Mass of mid mesentery concerning for neoplastic process.  Will follow with Dr Earlean Shawl and possible oncology  7. Hypokalemia and hypo-magnesium has order for 40 meq KDUR twice today would recommend replacing Mg+ as well  8. HTN was on amlodipine-Valsartan 5-160 mg at home -on hold her coreg dose is increased.  9. Per pt and daughter orthostatic hypotension at times with falls - monitor BP this admit and check orthostatic BP prior to discharge. 10. DM-2/iron def anemia on iron with stable HGB/HTN/HLD/urinary retention and overactive bladder per IM        For questions or updates, please contact [place] Please consult [email or web address] for contact info under    Signed, Cecilie Kicks, NP  08/01/2019 11:48 AM

## 2019-08-01 NOTE — Consult Note (Signed)
Referring Provider:  [facility]         Primary Care Physician:  Marton Redwood, MD Primary Gastroenterologist:   Earlie Raveling, MD          We were asked to see this patient for:   Diarrhea               ASSESSMENT /  PLAN    Deanna [name] is a 86 y.o. female PMH significant for, but not necessarily limited to, diabetes, breast cancer, hypertension, depression, appendectomy  # Subacute diarrhea, non-bloody --Stools studies negative for infection but lactoferrin + suggesting inflammatory process.  --Started several weeks ago.  Just now getting better. Steroids started Saturday.   --I noticed that Mesalamine is on patient's home list but patient nor daughter know anything about the medication.Reviewed records in [place]. Office notes in 2019 by Dr. Earlean Shawl mention inflammatory diarrhea with elevated lactoferrin and calprotectin which at that time was controlled on probiotics and lialda. -May need inpatient colonoscopy for evaluation of diarrhea. CT scan suggesting mass over mid mesentery with carcinoid neoplasm on list of DDx. Mass doesn't appear intraluminal. Diarrhea can be associated with carcinoid tumors. However recent diarrhea probably a flare of the same process as in 2019, especially if she hasn't been compliant with mesalamine. I will downgrade diet to clears in case of colonoscopy tomorrow though this may not be possible due to Afib with RVR. Cardiology is seeing her.  --I have requested last colonoscopy report from [facility]. However, daughter just arrived to hospital and tells me last colonoscopy was way back in 2012 so not likely to be helpful.  --After reading [facility] notes this case was discussed with Dr. Collene Mares a few days ago. She recommended discussing care with patient's primary GI Dr. Jacques Navy who apparently recommended trial of steroids and so she was started on Solumedrol Saturday which may be why she is reported improvement in diarrhea now.    # Chronic normocytic  anemia --No overt GI bleeding.   --Ferritin normal at 93, TIBC normal at 274 but percent sat low at 6 --Last colonoscopy report not in Care Everywhere.   # Abdominal mass --Over mid mesentery, doesn't appear intraluminal. .  --May need biopsy at some point.   # Atrial Fib with RVR.  --Cardiology consulting --On IV heparin.   HPI:    Chief Complaint: Diarrhea  Deanna [name] is a 86 y.o. female with PMH as listed above.  Patient admitted to the ED 07/27/2019 for diarrhea and lower abdominal pain.  Her white count was elevated at 17,  she was hypotensive when EMS arrived for pick up.  CT scan of the abdomen and pelvis with contrast remarkable for fluid-filled colon with mild wall thickening of the distal descending and sigmoid colon.  Mass over the mid mesentery with associated coarse calcification measuring 3.1 x 4 cm neoplastic process such as carcinoid tumor is not excluded.  Mild cholelithiasis.  Inpatient stool studies remarkable for negative C. difficile, negative gastrointestinal panel, positive lactoferrin.  SARS negative.  Her white count has remained elevated, up to 19 K today.  Blood and urine cultures negative  Patient says her diarrhea started weeks ago. It was random, not necessarily related to meals and would occur during the day and / or during the night. However, she never had more than a maximum of two BMs / day. Took Imodium as needed. Stool was non-bloody. She did have associated abdominal cramping. No fevers. Her appetite remained unaffected and  denies associated weight loss. Denies recent antibiotics. Denies contact with others with similar symptoms. No recent medication changes. Patient called Dr. Earlean Shawl about diarrhea but apparently they never made contact. Lialda on home med list. Patient doesn't know if she has been taking it.   PREVIOUS ENDOSCOPIC EVALUATIONS / GI STUDIES :   07/27/19 Non-contrast CT scan CT scan of the abdomen and pelvis with contrast remarkable  for fluid-filled colon with mild wall thickening of the distal descending and sigmoid colon.  Mass over the mid mesentery with associated coarse calcification measuring 3.1 x 4 cm neoplastic process such as carcinoid tumor is not excluded.  Mild cholelithiasis.    Past Medical History:  Diagnosis Date  . Arthritis    all over  . Cancer ([place])    breast  . Depression   . Diabetes mellitus without complication (HCC)    diet controlled  . Hypertension     Past Surgical History:  Procedure Laterality Date  . APPENDECTOMY     with lower back surgery  . BACK SURGERY     x2  . BREAST SURGERY Left 1995   complete left mastectomy  . CERVICAL SPINE SURGERY    . EYE SURGERY Bilateral    cataracts  . TONSILLECTOMY     at age 77    Prior to Admission medications   Medication Sig Start Date End Date Taking? Authorizing Provider  acetaminophen (TYLENOL) 325 MG tablet Take 650 mg by mouth 2 (two) times a day.    Yes [provider]  amLODipine-valsartan (EXFORGE) 5-160 MG tablet Take 1 tablet by mouth daily.   Yes [provider]  aspirin EC 81 MG tablet Take 81 mg by mouth at bedtime.    Yes [provider]  buPROPion (WELLBUTRIN XL) 150 MG 24 hr tablet Take 150 mg by mouth daily.   Yes [provider]  carvedilol (COREG) 12.5 MG tablet Take 12.5 mg by mouth 2 (two) times daily with a meal.  01/07/16  Yes [provider]  Cyanocobalamin (VITAMIN B 12 PO) Take 1,000 mcg by mouth daily.   Yes [provider]  cyclobenzaprine (FLEXERIL) 5 MG tablet Take 5 mg by mouth every 8 (eight) hours as needed for muscle spasms.   Yes [provider]  denosumab (PROLIA) 60 MG/ML SOLN injection Inject 60 mg into the skin every 6 (six) months. Administer in upper arm, thigh, or abdomen   Yes [provider]  diclofenac Sodium (VOLTAREN) 1 % GEL Apply 2 g topically 4 (four) times daily as needed (painful joints).   Yes [provider]  escitalopram (LEXAPRO) 10 MG tablet Take 10 mg by mouth daily. 04/12/18  Yes [provider]  esomeprazole (NEXIUM) 40 MG capsule Take 40 mg by mouth daily at 12 noon.   Yes [provider]  hydrochlorothiazide (MICROZIDE) 12.5 MG capsule Take 12.5 mg by mouth daily.   Yes [provider]  loperamide (IMODIUM A-D) 2 MG tablet Take 2 mg by mouth 4 (four) times daily as needed for diarrhea or loose stools.   Yes [provider]  LYRICA 150 MG capsule Take 150 mg by mouth 2 (two) times daily.  11/03/15  Yes [provider]  mesalamine (LIALDA) 1.2 g EC tablet Take 2.4 g by mouth daily with breakfast.   Yes [provider]  metFORMIN (GLUCOPHAGE) 500 MG tablet Take 500 mg by mouth daily with breakfast.   Yes [provider]  MYRBETRIQ 50 MG  TB24 tablet Take 50 mg by mouth at bedtime.  10/21/15  Yes [provider]  Probiotic Product (PROBIOTIC-10 PO) Take 1 capsule by mouth daily.   Yes [provider]  rosuvastatin (CRESTOR) 10 MG tablet Take 10 mg by mouth at bedtime.    Yes [provider]  SYMAX-SR 0.375 MG 12 hr tablet Take 0.375 mg by mouth every 12 (twelve) hours as needed for pain. 05/10/19  Yes [provider]  traMADol (ULTRAM) 50 MG tablet Take 50 mg by mouth every 8 (eight) hours as needed for moderate pain.   Yes [provider]  VITAMIN D, CHOLECALCIFEROL, PO Take 400 Units by mouth daily.    Yes [provider]  vitamin E 400 UNIT capsule Take 400 Units by mouth 2 (two) times a day.    Yes [provider]  cephALEXin (KEFLEX) 500 MG capsule Take 1 capsule (500 mg total) by mouth 3 (three) times daily. Patient not taking: Reported on 09/13/2018 06/28/18   Julianne Rice, MD  feeding supplement, ENSURE ENLIVE, (ENSURE ENLIVE) LIQD Take 237 mLs by mouth 3 (three) times daily between meals. Patient not taking: Reported on 07/27/2019 09/28/16   Arrien, Jimmy Picket, MD  predniSONE (STERAPRED UNI-PAK 21 TAB) 5 MG (21) TBPK tablet Take as instructed with food. 6,5,4,3,2,1 stop Patient not taking: Reported on 07/27/2019 02/20/19   Marybelle Killings, MD    Current Facility-Administered Medications  Medication Dose Route Frequency Provider Last Rate Last Admin  . 0.9 %  sodium chloride infusion   Intravenous PRN Elodia Florence., MD 10 mL/hr at 08/01/19 0600 Rate Verify at 08/01/19 0600  . acetaminophen (TYLENOL) tablet 650 mg  650 mg Oral Q6H PRN Elodia Florence., MD       Or  . acetaminophen (TYLENOL) suppository 650 mg  650 mg Rectal Q6H PRN Elodia Florence., MD      . buPROPion (WELLBUTRIN XL) 24 hr tablet 150 mg  150 mg Oral Daily Elodia Florence., MD   150 mg at 08/01/19 0956  . carvedilol (COREG) tablet 25 mg  25 mg Oral BID WC Elodia Florence., MD   25 mg at 08/01/19 0802  . Chlorhexidine Gluconate Cloth 2 % PADS 6 each  6 each Topical Daily Elodia Florence., MD   6 each at 07/31/19 1348  . diltiazem (CARDIZEM) 125 mg in dextrose 5% 125 mL (1 mg/mL) infusion  5-15 mg/hr Intravenous Titrated Elodia Florence., MD 5 mL/hr at 08/01/19 0600 5 mg/hr at 08/01/19 0600  . escitalopram (LEXAPRO) tablet 10 mg  10 mg Oral Daily Elodia Florence., MD   10 mg at 08/01/19 0954  . ferrous sulfate tablet 325 mg  325 mg Oral Q breakfast Elodia Florence., MD   325 mg at 08/01/19 0801  . heparin ADULT infusion 100 units/mL (25000 units/261mL sodium chloride 0.45%)  600 Units/hr Intravenous Continuous Elodia Florence., MD 6 mL/hr at 08/01/19 0600 600 Units/hr at 08/01/19 0600  . insulin aspart (novoLOG) injection 0-5 Units  0-5 Units Subcutaneous QHS Elodia Florence., MD   3 Units at 07/31/19 2131  . insulin aspart (novoLOG) injection 0-9 Units  0-9 Units Subcutaneous TID WC Elodia Florence., MD   2 Units at 08/01/19 [id number]  . insulin aspart (novoLOG) injection 2 Units  2 Units Subcutaneous TID WC Elodia Florence., MD   2 Units at  07/31/19 1750  . insulin glargine (LANTUS) injection 5 Units  5 Units Subcutaneous Daily Elodia Florence., MD   5 Units at 08/01/19 1004  . labetalol (NORMODYNE) injection 10 mg  10 mg Intravenous Q2H PRN Opyd, Ilene Qua, MD   10 mg at 07/29/19 1938  . loperamide (IMODIUM) capsule 2 mg  2 mg Oral QID Elodia Florence., MD   2 mg at 08/01/19 0955  . mesalamine (LIALDA) EC tablet 2.4 g  2.4 g Oral Q breakfast Elodia Florence., MD   2.4 g at 08/01/19 0801  . methylPREDNISolone sodium succinate (SOLU-MEDROL) 40 mg/mL injection 40 mg  40 mg Intravenous Daily Elodia Florence., MD   40 mg at 08/01/19 0950  . metoprolol tartrate (LOPRESSOR) injection 2.5 mg  2.5 mg Intravenous Q5 min PRN Elodia Florence., MD   2.5 mg at 07/31/19 1813  . mirabegron ER (MYRBETRIQ) tablet 50 mg  50 mg Oral QHS Elodia Florence., MD   50 mg at 07/31/19 2131  . morphine 2 MG/ML injection 1 mg  1 mg Intravenous Q4H PRN Elodia Florence., MD   1 mg at 07/27/19 1937  . ondansetron (ZOFRAN) tablet 4 mg  4 mg Oral Q6H PRN Elodia Florence., MD       Or  . ondansetron [facility]) injection 4 mg  4 mg Intravenous Q6H PRN Elodia Florence., MD      . oxyCODONE (Oxy IR/ROXICODONE) immediate release tablet 2.5 mg  2.5 mg Oral Q4H PRN Elodia Florence., MD   2.5 mg at 07/28/19 0940  . pantoprazole (PROTONIX) EC tablet 40 mg  40 mg Oral Daily Elodia Florence., MD   40 mg at 08/01/19 0957  . piperacillin-tazobactam (ZOSYN) IVPB 3.375 g  3.375 g Intravenous Q8H BellSharyn Lull T, RPH 12.5 mL/hr at 08/01/19 0319 3.375 g at 08/01/19 0319  . pregabalin (LYRICA) capsule 75 mg  75 mg Oral BID Elodia Florence., MD   75 mg at 08/01/19 0953  . vitamin B-12 (CYANOCOBALAMIN) tablet 1,000 mcg  1,000 mcg Oral Daily Elodia Florence., MD   1,000 mcg at 08/01/19 8341    Allergies as of 07/27/2019 - Review Complete 07/27/2019  Allergen Reaction Noted  .  Codeine Nausea Only 10/08/2014    No family history on file.  Social History   Socioeconomic History  . Marital status: Married    Spouse name: Not on file  . Number of children: Not on file  . Years of education: Not on file  . Highest education level: Not on file  Occupational History  . Not on file  Tobacco Use  . Smoking status: Never Smoker  . Smokeless tobacco: Never Used  Vaping Use  . Vaping Use: Never used  Substance and Sexual Activity  . Alcohol use: Never  . Drug use: Never  . Sexual activity: Not on file  Other Topics Concern  . Not on file  Social History Narrative  . Not on file   Social Determinants of Health   Financial Resource Strain:   . Difficulty of Paying Living Expenses:   Food Insecurity:   . Worried About Charity fundraiser in the Last Year:   . Arboriculturist in the Last Year:   Transportation Needs:   . Film/video editor (Medical):   Marland Kitchen Lack of Transportation (Non-Medical):   Physical Activity:   .  Days of Exercise per Week:   . Minutes of Exercise per Session:   Stress:   . Feeling of Stress :   Social Connections:   . Frequency of Communication with Friends and Family:   . Frequency of Social Gatherings with Friends and Family:   . Attends Religious Services:   . Active Member of Clubs or Organizations:   . Attends Archivist Meetings:   Marland Kitchen Marital Status:   Intimate Partner Violence:   . Fear of Current or Ex-Partner:   . Emotionally Abused:   Marland Kitchen Physically Abused:   . Sexually Abused:     Review of Systems: All systems reviewed and negative except where noted in HPI.  Physical Exam: Vital signs in last 24 hours: Temp:  [97.6 F (36.4 C)-98.6 F (37 C)] 98.4 F (36.9 C) (06/23 0430) Pulse Rate:  [55-118] 84 (06/23 0430) Resp:  [10-25] 23 (06/23 0430) BP: (92-130)/(56-90) 108/63 (06/23 0430) SpO2:  [90 %-98 %] 97 % (06/23 0430) Weight:  [46.8 kg] 46.8 kg (06/23 0452) Last BM Date: 08/01/19 General:    Alert, well-developed,  female in NAD Psych:  Pleasant, cooperative. Normal mood and affect. Eyes:  Pupils equal, sclera clear, no icterus.   Conjunctiva pink. Ears:  Normal auditory acuity. Nose:  No deformity, discharge,  or lesions. Neck:  Supple; no masses Lungs:  Clear throughout to auscultation.   No wheezes, crackles, or rhonchi.  Heart:  Regular rate ,no lower extremity edema Abdomen:  Soft, non-distended, nontender, BS active, no palp mass   Rectal:  Deferred  Msk:  Symmetrical without gross deformities. . Neurologic:  Alert and  oriented x4;  grossly normal neurologically. Skin:  Intact without significant lesions or rashes.   Intake/Output from previous day: 06/22 0701 - 06/23 0700 In: 541.2 [P.O.:240; I.V.:301.2] Out: 1775 [EPPIR:5188] Intake/Output this shift: No intake/output data recorded.  Lab Results: Recent Labs    07/30/19 0518 07/31/19 0746 08/01/19 0425  WBC 17.3* 15.3* 18.9*  HGB 10.8* 12.1 12.1  HCT 32.1* 36.3 36.6  PLT 160 194 215   BMET Recent Labs    07/30/19 0518 07/31/19 0746 08/01/19 0425  NA 136 141 140  K 3.5 3.1* 3.1*  CL 105 102 103  CO2 20* 23 25  GLUCOSE 190* 197* 197*  BUN 21 30* 41*  CREATININE 1.04* 1.45* 1.70*  CALCIUM 9.7 9.5 9.2   LFT Recent Labs    08/01/19 0425  PROT 6.8  ALBUMIN 3.3*  AST 13*  ALT 16  ALKPHOS 55  BILITOT 0.7   PT/INR No results for input(s): LABPROT, INR in the last 72 hours. Hepatitis Panel No results for input(s): HEPBSAG, HCVAB, HEPAIGM, HEPBIGM in the last 72 hours.   . CBC Latest Ref Rng & Units 08/01/2019 07/31/2019 07/30/2019  WBC 4.0 - 10.5 K/uL 18.9(H) 15.3(H) 17.3(H)  Hemoglobin 12.0 - 15.0 g/dL 12.1 12.1 10.8(L)  Hematocrit 36 - 46 % 36.6 36.3 32.1(L)  Platelets 150 - 400 K/uL 215 194 160    . CMP Latest Ref Rng & Units 08/01/2019 07/31/2019 07/30/2019  Glucose 70 - 99 mg/dL 197(H) 197(H) 190(H)  BUN 8 - 23 mg/dL 41(H) 30(H) 21  Creatinine 0.44 - 1.00 mg/dL 1.70(H) 1.45(H)  1.04(H)  Sodium 135 - 145 mmol/L 140 141 136  Potassium 3.5 - 5.1 mmol/L 3.1(L) 3.1(L) 3.5  Chloride 98 - 111 mmol/L 103 102 105  CO2 22 - 32 mmol/L 25 23 20(L)  Calcium 8.9 - 10.3 mg/dL 9.2 9.5 9.7  Total Protein 6.5 - 8.1 g/dL 6.8 7.4 7.3  Total Bilirubin 0.3 - 1.2 mg/dL 0.7 1.0 0.8  Alkaline Phos 38 - 126 U/L 55 61 66  AST 15 - 41 U/L 13(L) 14(L) 20  ALT 0 - 44 U/L 16 16 20    Studies/Results: DG CHEST PORT 1 VIEW  Result Date: 07/31/2019 CLINICAL DATA:  Shortness of breath.  History of breast cancer. EXAM: PORTABLE CHEST 1 VIEW COMPARISON:  07/31/2019 FINDINGS: Lower cervical plate and screw fixator. Left axillary clips noted along with left mastectomy. Atherosclerotic calcification of the aortic arch. The lungs appear clear. Levoconvex thoracolumbar scoliosis which may be positional. Heart size within normal limits. The patient is rotated to the right on today's radiograph, reducing diagnostic sensitivity and specificity. IMPRESSION: 1. No acute findings. 2.  Aortic Atherosclerosis (ICD10-I70.0). Electronically Signed   By: Van Clines M.D.   On: 07/31/2019 19:01   DG CHEST PORT 1 VIEW  Result Date: 07/31/2019 CLINICAL DATA:  Hypoxia. EXAM: PORTABLE CHEST 1 VIEW COMPARISON:  07/30/2019 FINDINGS: Normal heart size. No pleural effusion. Interval improvement in interstitial edema. No airspace consolidation identified. Previous ACDF within the lower cervical spine. IMPRESSION: Interval improvement in interstitial edema. Electronically Signed   By: Kerby Moors M.D.   On: 07/31/2019 09:15    Active Problems:   Colitis   Atrial fibrillation with RVR ([place])    Tye Savoy, NP-C @  08/01/2019, 10:29 AM

## 2019-08-01 NOTE — Progress Notes (Signed)
Pharmacy Brief Note  86 y/o F on heparin drip for atrial fibrillation.   O: HL 0.44  A: Heparin level is at goal. No issues per RN.   P: Continue heparin infusion at 700 units/hr and f/u AM labs.   Ulice Dash, PharmD, BCPS

## 2019-08-01 NOTE — Progress Notes (Signed)
[facility] for heparin Indication: atrial fibrillation  Allergies  Allergen Reactions  . Codeine Nausea Only    Patient Measurements: Height: 5\' 2"  (157.5 cm) Weight: 46.8 kg (103 lb 3.2 oz) IBW/kg (Calculated) : 50.1 Heparin Dosing Weight: n/a. Use TBW = 47 kg  Vital Signs: Temp: 98.4 F (36.9 C) (06/23 0430) Temp Source: Oral (06/23 0430) BP: 108/63 (06/23 0430) Pulse Rate: 84 (06/23 0430)  Labs: Recent Labs    07/30/19 0518 07/30/19 2216 07/31/19 0746 07/31/19 1837 08/01/19 0425  HGB 10.8*  --  12.1  --  12.1  HCT 32.1*  --  36.3  --  36.6  PLT 160  --  194  --  215  HEPARINUNFRC  --    < > 0.71* 0.13* 0.33  CREATININE 1.04*  --  1.45*  --  1.70*   < > = values in this interval not displayed.    Estimated Creatinine Clearance: 17.2 mL/min (A) (by C-G formula based on SCr of 1.7 mg/dL (H)).   Medical History: Past Medical History:  Diagnosis Date  . Arthritis    all over  . Cancer ([place])    breast  . Depression   . Diabetes mellitus without complication (HCC)    diet controlled  . Hypertension       Assessment: 86 y.o.femalewith medical history significant ofT2DM, HTN, depression, breast cancer, inflammatory diarrhea who presents with worsening symptoms of diarrhea.  Patient developed atrial fibrillation with RVR, denies any chest pain but notes some SOB.  Pharmacy consulted to dose heparin.  No prior AC noted.  Today, 08/01/19  CBC: Hgb & Plt both stable, WNL  HL = 0.33 is therapeutic on heparin infusion of 600 units/hr  Confirmed with RN that heparin infusing at correct rate with no interruptions. No signs/symptoms of bleeding.   Confirmed that HL drawn from right hand appropriately   Goal of Therapy:  Heparin level 0.3-0.7 units/ml Monitor platelets by anticoagulation protocol:   Plan:   Continue heparin infusion at current rate of 600 units/hr  Check confirmatory HL in 8 hours  Daily HL, CBC    Lenis Noon, PharmD 08/01/19 5:14 AM

## 2019-08-01 NOTE — Plan of Care (Signed)

## 2019-08-01 NOTE — H&P (View-Only) (Signed)
Referring Provider:  [facility]         Primary Care Physician:  Marton Redwood, MD Primary Gastroenterologist:   Earlie Raveling, MD          We were asked to see this patient for:   Diarrhea               ASSESSMENT /  PLAN    OPHA MCGHEE is a 86 y.o. female PMH significant for, but not necessarily limited to, diabetes, breast cancer, hypertension, depression, appendectomy  # Subacute diarrhea, non-bloody --Stools studies negative for infection but lactoferrin + suggesting inflammatory process.  --Started several weeks ago.  Just now getting better. Steroids started Saturday.   --I noticed that Mesalamine is on patient's home list but patient nor daughter know anything about the medication.Reviewed records in [facility]. Office notes in 2019 by Dr. Earlean Shawl mention inflammatory diarrhea with elevated lactoferrin and calprotectin which at that time was controlled on probiotics and lialda. -May need inpatient colonoscopy for evaluation of diarrhea. CT scan suggesting mass over mid mesentery with carcinoid neoplasm on list of DDx. Mass doesn't appear intraluminal. Diarrhea can be associated with carcinoid tumors. However recent diarrhea probably a flare of the same process as in 2019, especially if she hasn't been compliant with mesalamine. I will downgrade diet to clears in case of colonoscopy tomorrow though this may not be possible due to Afib with RVR. Cardiology is seeing her.  --I have requested last colonoscopy report from [facility]. However, daughter just arrived to hospital and tells me last colonoscopy was way back in 2012 so not likely to be helpful.  --After reading [facility] notes this case was discussed with Dr. Collene Mares a few days ago. She recommended discussing care with patient's primary GI Dr. Jacques Navy who apparently recommended trial of steroids and so she was started on Solumedrol Saturday which may be why she is reported improvement in diarrhea now.    # Chronic normocytic  anemia --No overt GI bleeding.   --Ferritin normal at 93, TIBC normal at 274 but percent sat low at 6 --Last colonoscopy report not in Care Everywhere.   # Abdominal mass --Over mid mesentery, doesn't appear intraluminal. .  --May need biopsy at some point.   # Atrial Fib with RVR.  --Cardiology consulting --On IV heparin.   HPI:    Chief Complaint: Diarrhea  DEJUANA WEIST is a 86 y.o. female with PMH as listed above.  Patient admitted to the ED 07/27/2019 for diarrhea and lower abdominal pain.  Her white count was elevated at 17,  she was hypotensive when EMS arrived for pick up.  CT scan of the abdomen and pelvis with contrast remarkable for fluid-filled colon with mild wall thickening of the distal descending and sigmoid colon.  Mass over the mid mesentery with associated coarse calcification measuring 3.1 x 4 cm neoplastic process such as carcinoid tumor is not excluded.  Mild cholelithiasis.  Inpatient stool studies remarkable for negative C. difficile, negative gastrointestinal panel, positive lactoferrin.  SARS negative.  Her white count has remained elevated, up to 19 K today.  Blood and urine cultures negative  Patient says her diarrhea started weeks ago. It was random, not necessarily related to meals and would occur during the day and / or during the night. However, she never had more than a maximum of two BMs / day. Took Imodium as needed. Stool was non-bloody. She did have associated abdominal cramping. No fevers. Her appetite remained unaffected and  denies associated weight loss. Denies recent antibiotics. Denies contact with others with similar symptoms. No recent medication changes. Patient called Dr. Earlean Shawl about diarrhea but apparently they never made contact. Lialda on home med list. Patient doesn't know if she has been taking it.   PREVIOUS ENDOSCOPIC EVALUATIONS / GI STUDIES :   07/27/19 Non-contrast CT scan CT scan of the abdomen and pelvis with contrast remarkable  for fluid-filled colon with mild wall thickening of the distal descending and sigmoid colon.  Mass over the mid mesentery with associated coarse calcification measuring 3.1 x 4 cm neoplastic process such as carcinoid tumor is not excluded.  Mild cholelithiasis.    Past Medical History:  Diagnosis Date  . Arthritis    all over  . Cancer ([place])    breast  . Depression   . Diabetes mellitus without complication (HCC)    diet controlled  . Hypertension     Past Surgical History:  Procedure Laterality Date  . APPENDECTOMY     with lower back surgery  . BACK SURGERY     x2  . BREAST SURGERY Left 1995   complete left mastectomy  . CERVICAL SPINE SURGERY    . EYE SURGERY Bilateral    cataracts  . TONSILLECTOMY     at age 37    Prior to Admission medications   Medication Sig Start Date End Date Taking? Authorizing Provider  acetaminophen (TYLENOL) 325 MG tablet Take 650 mg by mouth 2 (two) times a day.    Yes [provider]  amLODipine-valsartan (EXFORGE) 5-160 MG tablet Take 1 tablet by mouth daily.   Yes [provider]  aspirin EC 81 MG tablet Take 81 mg by mouth at bedtime.    Yes [provider]  buPROPion (WELLBUTRIN XL) 150 MG 24 hr tablet Take 150 mg by mouth daily.   Yes [provider]  carvedilol (COREG) 12.5 MG tablet Take 12.5 mg by mouth 2 (two) times daily with a meal.  01/07/16  Yes [provider]  Cyanocobalamin (VITAMIN B 12 PO) Take 1,000 mcg by mouth daily.   Yes [provider]  cyclobenzaprine (FLEXERIL) 5 MG tablet Take 5 mg by mouth every 8 (eight) hours as needed for muscle spasms.   Yes [provider]  denosumab (PROLIA) 60 MG/ML SOLN injection Inject 60 mg into the skin every 6 (six) months. Administer in upper arm, thigh, or abdomen   Yes [provider]  diclofenac Sodium (VOLTAREN) 1 % GEL Apply 2 g topically 4 (four) times daily as needed (painful joints).   Yes [provider]  escitalopram (LEXAPRO) 10 MG tablet Take 10 mg by mouth daily. 04/12/18  Yes [provider]  esomeprazole (NEXIUM) 40 MG capsule Take 40 mg by mouth daily at 12 noon.   Yes [provider]  hydrochlorothiazide (MICROZIDE) 12.5 MG capsule Take 12.5 mg by mouth daily.   Yes [provider]  loperamide (IMODIUM A-D) 2 MG tablet Take 2 mg by mouth 4 (four) times daily as needed for diarrhea or loose stools.   Yes [provider]  LYRICA 150 MG capsule Take 150 mg by mouth 2 (two) times daily.  11/03/15  Yes [provider]  mesalamine (LIALDA) 1.2 g EC tablet Take 2.4 g by mouth daily with breakfast.   Yes [provider]  metFORMIN (GLUCOPHAGE) 500 MG tablet Take 500 mg by mouth daily with breakfast.   Yes [provider]  MYRBETRIQ 50 MG  TB24 tablet Take 50 mg by mouth at bedtime.  10/21/15  Yes [provider]  Probiotic Product (PROBIOTIC-10 PO) Take 1 capsule by mouth daily.   Yes [provider]  rosuvastatin (CRESTOR) 10 MG tablet Take 10 mg by mouth at bedtime.    Yes [provider]  SYMAX-SR 0.375 MG 12 hr tablet Take 0.375 mg by mouth every 12 (twelve) hours as needed for pain. 05/10/19  Yes [provider]  traMADol (ULTRAM) 50 MG tablet Take 50 mg by mouth every 8 (eight) hours as needed for moderate pain.   Yes [provider]  VITAMIN D, CHOLECALCIFEROL, PO Take 400 Units by mouth daily.    Yes [provider]  vitamin E 400 UNIT capsule Take 400 Units by mouth 2 (two) times a day.    Yes [provider]  cephALEXin (KEFLEX) 500 MG capsule Take 1 capsule (500 mg total) by mouth 3 (three) times daily. Patient not taking: Reported on 09/13/2018 06/28/18   Julianne Rice, MD  feeding supplement, ENSURE ENLIVE, (ENSURE ENLIVE) LIQD Take 237 mLs by mouth 3 (three) times daily between meals. Patient not taking: Reported on 07/27/2019 09/28/16   Arrien, Jimmy Picket, MD  predniSONE (STERAPRED UNI-PAK 21 TAB) 5 MG (21) TBPK tablet Take as instructed with food. 6,5,4,3,2,1 stop Patient not taking: Reported on 07/27/2019 02/20/19   Marybelle Killings, MD    Current Facility-Administered Medications  Medication Dose Route Frequency Provider Last Rate Last Admin  . 0.9 %  sodium chloride infusion   Intravenous PRN Elodia Florence., MD 10 mL/hr at 08/01/19 0600 Rate Verify at 08/01/19 0600  . acetaminophen (TYLENOL) tablet 650 mg  650 mg Oral Q6H PRN Elodia Florence., MD       Or  . acetaminophen (TYLENOL) suppository 650 mg  650 mg Rectal Q6H PRN Elodia Florence., MD      . buPROPion (WELLBUTRIN XL) 24 hr tablet 150 mg  150 mg Oral Daily Elodia Florence., MD   150 mg at 08/01/19 0956  . carvedilol (COREG) tablet 25 mg  25 mg Oral BID WC Elodia Florence., MD   25 mg at 08/01/19 0802  . Chlorhexidine Gluconate Cloth 2 % PADS 6 each  6 each Topical Daily Elodia Florence., MD   6 each at 07/31/19 1348  . diltiazem (CARDIZEM) 125 mg in dextrose 5% 125 mL (1 mg/mL) infusion  5-15 mg/hr Intravenous Titrated Elodia Florence., MD 5 mL/hr at 08/01/19 0600 5 mg/hr at 08/01/19 0600  . escitalopram (LEXAPRO) tablet 10 mg  10 mg Oral Daily Elodia Florence., MD   10 mg at 08/01/19 0954  . ferrous sulfate tablet 325 mg  325 mg Oral Q breakfast Elodia Florence., MD   325 mg at 08/01/19 0801  . heparin ADULT infusion 100 units/mL (25000 units/213mL sodium chloride 0.45%)  600 Units/hr Intravenous Continuous Elodia Florence., MD 6 mL/hr at 08/01/19 0600 600 Units/hr at 08/01/19 0600  . insulin aspart (novoLOG) injection 0-5 Units  0-5 Units Subcutaneous QHS Elodia Florence., MD   3 Units at 07/31/19 2131  . insulin aspart (novoLOG) injection 0-9 Units  0-9 Units Subcutaneous TID WC Elodia Florence., MD   2 Units at 08/01/19 [phone]  . insulin aspart (novoLOG) injection 2 Units  2 Units Subcutaneous TID WC Elodia Florence., MD   2 Units at  07/31/19 1750  . insulin glargine (LANTUS) injection 5 Units  5 Units Subcutaneous Daily Elodia Florence., MD   5 Units at 08/01/19 1004  . labetalol (NORMODYNE) injection 10 mg  10 mg Intravenous Q2H PRN Opyd, Ilene Qua, MD   10 mg at 07/29/19 1938  . loperamide (IMODIUM) capsule 2 mg  2 mg Oral QID Elodia Florence., MD   2 mg at 08/01/19 0955  . mesalamine (LIALDA) EC tablet 2.4 g  2.4 g Oral Q breakfast Elodia Florence., MD   2.4 g at 08/01/19 0801  . methylPREDNISolone sodium succinate (SOLU-MEDROL) 40 mg/mL injection 40 mg  40 mg Intravenous Daily Elodia Florence., MD   40 mg at 08/01/19 0950  . metoprolol tartrate (LOPRESSOR) injection 2.5 mg  2.5 mg Intravenous Q5 min PRN Elodia Florence., MD   2.5 mg at 07/31/19 1813  . mirabegron ER (MYRBETRIQ) tablet 50 mg  50 mg Oral QHS Elodia Florence., MD   50 mg at 07/31/19 2131  . morphine 2 MG/ML injection 1 mg  1 mg Intravenous Q4H PRN Elodia Florence., MD   1 mg at 07/27/19 1937  . ondansetron (ZOFRAN) tablet 4 mg  4 mg Oral Q6H PRN Elodia Florence., MD       Or  . ondansetron [facility]) injection 4 mg  4 mg Intravenous Q6H PRN Elodia Florence., MD      . oxyCODONE (Oxy IR/ROXICODONE) immediate release tablet 2.5 mg  2.5 mg Oral Q4H PRN Elodia Florence., MD   2.5 mg at 07/28/19 0940  . pantoprazole (PROTONIX) EC tablet 40 mg  40 mg Oral Daily Elodia Florence., MD   40 mg at 08/01/19 0957  . piperacillin-tazobactam (ZOSYN) IVPB 3.375 g  3.375 g Intravenous Q8H BellSharyn Lull T, RPH 12.5 mL/hr at 08/01/19 0319 3.375 g at 08/01/19 0319  . pregabalin (LYRICA) capsule 75 mg  75 mg Oral BID Elodia Florence., MD   75 mg at 08/01/19 0953  . vitamin B-12 (CYANOCOBALAMIN) tablet 1,000 mcg  1,000 mcg Oral Daily Elodia Florence., MD   1,000 mcg at 08/01/19 6384    Allergies as of 07/27/2019 - Review Complete 07/27/2019  Allergen Reaction Noted  .  Codeine Nausea Only 10/08/2014    No family history on file.  Social History   Socioeconomic History  . Marital status: Married    Spouse name: Not on file  . Number of children: Not on file  . Years of education: Not on file  . Highest education level: Not on file  Occupational History  . Not on file  Tobacco Use  . Smoking status: Never Smoker  . Smokeless tobacco: Never Used  Vaping Use  . Vaping Use: Never used  Substance and Sexual Activity  . Alcohol use: Never  . Drug use: Never  . Sexual activity: Not on file  Other Topics Concern  . Not on file  Social History Narrative  . Not on file   Social Determinants of Health   Financial Resource Strain:   . Difficulty of Paying Living Expenses:   Food Insecurity:   . Worried About Charity fundraiser in the Last Year:   . Arboriculturist in the Last Year:   Transportation Needs:   . Film/video editor (Medical):   Marland Kitchen Lack of Transportation (Non-Medical):   Physical Activity:   .  Days of Exercise per Week:   . Minutes of Exercise per Session:   Stress:   . Feeling of Stress :   Social Connections:   . Frequency of Communication with Friends and Family:   . Frequency of Social Gatherings with Friends and Family:   . Attends Religious Services:   . Active Member of Clubs or Organizations:   . Attends Archivist Meetings:   Marland Kitchen Marital Status:   Intimate Partner Violence:   . Fear of Current or Ex-Partner:   . Emotionally Abused:   Marland Kitchen Physically Abused:   . Sexually Abused:     Review of Systems: All systems reviewed and negative except where noted in HPI.  Physical Exam: Vital signs in last 24 hours: Temp:  [97.6 F (36.4 C)-98.6 F (37 C)] 98.4 F (36.9 C) (06/23 0430) Pulse Rate:  [55-118] 84 (06/23 0430) Resp:  [10-25] 23 (06/23 0430) BP: (92-130)/(56-90) 108/63 (06/23 0430) SpO2:  [90 %-98 %] 97 % (06/23 0430) Weight:  [46.8 kg] 46.8 kg (06/23 0452) Last BM Date: 08/01/19 General:    Alert, well-developed,  female in NAD Psych:  Pleasant, cooperative. Normal mood and affect. Eyes:  Pupils equal, sclera clear, no icterus.   Conjunctiva pink. Ears:  Normal auditory acuity. Nose:  No deformity, discharge,  or lesions. Neck:  Supple; no masses Lungs:  Clear throughout to auscultation.   No wheezes, crackles, or rhonchi.  Heart:  Regular rate ,no lower extremity edema Abdomen:  Soft, non-distended, nontender, BS active, no palp mass   Rectal:  Deferred  Msk:  Symmetrical without gross deformities. . Neurologic:  Alert and  oriented x4;  grossly normal neurologically. Skin:  Intact without significant lesions or rashes.   Intake/Output from previous day: 06/22 0701 - 06/23 0700 In: 541.2 [P.O.:240; I.V.:301.2] Out: 1775 [VEHMC:9470] Intake/Output this shift: No intake/output data recorded.  Lab Results: Recent Labs    07/30/19 0518 07/31/19 0746 08/01/19 0425  WBC 17.3* 15.3* 18.9*  HGB 10.8* 12.1 12.1  HCT 32.1* 36.3 36.6  PLT 160 194 215   BMET Recent Labs    07/30/19 0518 07/31/19 0746 08/01/19 0425  NA 136 141 140  K 3.5 3.1* 3.1*  CL 105 102 103  CO2 20* 23 25  GLUCOSE 190* 197* 197*  BUN 21 30* 41*  CREATININE 1.04* 1.45* 1.70*  CALCIUM 9.7 9.5 9.2   LFT Recent Labs    08/01/19 0425  PROT 6.8  ALBUMIN 3.3*  AST 13*  ALT 16  ALKPHOS 55  BILITOT 0.7   PT/INR No results for input(s): LABPROT, INR in the last 72 hours. Hepatitis Panel No results for input(s): HEPBSAG, HCVAB, HEPAIGM, HEPBIGM in the last 72 hours.   . CBC Latest Ref Rng & Units 08/01/2019 07/31/2019 07/30/2019  WBC 4.0 - 10.5 K/uL 18.9(H) 15.3(H) 17.3(H)  Hemoglobin 12.0 - 15.0 g/dL 12.1 12.1 10.8(L)  Hematocrit 36 - 46 % 36.6 36.3 32.1(L)  Platelets 150 - 400 K/uL 215 194 160    . CMP Latest Ref Rng & Units 08/01/2019 07/31/2019 07/30/2019  Glucose 70 - 99 mg/dL 197(H) 197(H) 190(H)  BUN 8 - 23 mg/dL 41(H) 30(H) 21  Creatinine 0.44 - 1.00 mg/dL 1.70(H) 1.45(H)  1.04(H)  Sodium 135 - 145 mmol/L 140 141 136  Potassium 3.5 - 5.1 mmol/L 3.1(L) 3.1(L) 3.5  Chloride 98 - 111 mmol/L 103 102 105  CO2 22 - 32 mmol/L 25 23 20(L)  Calcium 8.9 - 10.3 mg/dL 9.2 9.5 9.7  Total Protein 6.5 - 8.1 g/dL 6.8 7.4 7.3  Total Bilirubin 0.3 - 1.2 mg/dL 0.7 1.0 0.8  Alkaline Phos 38 - 126 U/L 55 61 66  AST 15 - 41 U/L 13(L) 14(L) 20  ALT 0 - 44 U/L 16 16 20    Studies/Results: DG CHEST PORT 1 VIEW  Result Date: 07/31/2019 CLINICAL DATA:  Shortness of breath.  History of breast cancer. EXAM: PORTABLE CHEST 1 VIEW COMPARISON:  07/31/2019 FINDINGS: Lower cervical plate and screw fixator. Left axillary clips noted along with left mastectomy. Atherosclerotic calcification of the aortic arch. The lungs appear clear. Levoconvex thoracolumbar scoliosis which may be positional. Heart size within normal limits. The patient is rotated to the right on today's radiograph, reducing diagnostic sensitivity and specificity. IMPRESSION: 1. No acute findings. 2.  Aortic Atherosclerosis (ICD10-I70.0). Electronically Signed   By: Van Clines M.D.   On: 07/31/2019 19:01   DG CHEST PORT 1 VIEW  Result Date: 07/31/2019 CLINICAL DATA:  Hypoxia. EXAM: PORTABLE CHEST 1 VIEW COMPARISON:  07/30/2019 FINDINGS: Normal heart size. No pleural effusion. Interval improvement in interstitial edema. No airspace consolidation identified. Previous ACDF within the lower cervical spine. IMPRESSION: Interval improvement in interstitial edema. Electronically Signed   By: Kerby Moors M.D.   On: 07/31/2019 09:15    Active Problems:   Colitis   Atrial fibrillation with RVR ([place])    Tye Savoy, NP-C @  08/01/2019, 10:29 AM

## 2019-08-01 NOTE — TOC Progression Note (Signed)
Transition of Care [facility]) - Progression Note    Patient Details  Name: Deanna [name] MRN: [id number] Date of Birth: [birth year]  Transition of Care [facility]) CM/SW Contact  Maxcine Strong, [place] Pulse, RN Phone Number: 08/01/2019, 3:54 PM  Clinical Narrative:  PT recc SNF-dtr Debbie/Joani agree to SNF @ [place]-rep Mia aware-fl2,H&P, [facility] faxed.     Expected Discharge Plan: Skilled Nursing Facility Barriers to Discharge: Continued Medical Work up  Expected Discharge Plan and Services Expected Discharge Plan: [place]   Discharge Planning Services: CM Consult   Living arrangements for the past 2 months: [place]                           HH Arranged: PT [facility] Agency: Other - See comment ([facility] has own HHPT) Date HH Agency Contacted: 07/31/19 Time [place]: 1256 Representative spoke with at [place]: [place] ([place]) Interventions    Readmission Risk Interventions No flowsheet data found.

## 2019-08-01 NOTE — NC FL2 (Addendum)
[place] MEDICAID FL2 LEVEL OF CARE SCREENING TOOL     IDENTIFICATION  Patient Name: Deanna [name] Birthdate: 1930/05/30 Sex: female Admission Date (Current Location): 07/27/2019  [facility] and [place] Number:  Herbalist and Address:  [facility],  [place], [place]      Provider Number: [id number]  Attending Physician Name and Address:  Mariel Aloe, MD  Relative Name and Phone Number:  Mitchel Honour [phone]    Current Level of Care: Hospital Recommended Level of Care: [place] Prior Approval Number:    Date Approved/Denied:   PASRR Number:   [id number] Discharge Plan: SNF    Current Diagnoses: Patient Active Problem List   Diagnosis Date Noted  . Atrial fibrillation with RVR ([place]) 07/30/2019  . Colitis 07/27/2019  . Type 2 macular telangiectasis, left 07/26/2019  . Retinal hemorrhage of left eye 07/26/2019  . Type 2 macular telangiectasis, right 07/26/2019  . Vitreomacular adhesion of right eye 07/26/2019  . Vitreomacular adhesion of left eye 07/26/2019  . Loss of appetite 09/23/2016  . AKI (acute kidney injury) ([place]) 09/23/2016  . Orthostasis 09/23/2016  . Hyponatremia 09/23/2016  . Leukocytosis 09/23/2016  . Normocytic anemia 09/23/2016  . Depression with anxiety 09/23/2016  . Diabetes mellitus type II, non insulin dependent ([place]) 09/23/2016  . Transaminasemia 09/23/2016  . Dehydration   . Elevated LFTs   . Chronic midline low back pain without sciatica 01/13/2016    Orientation RESPIRATION BLADDER Height & Weight     Time, Self, Situation, Place  Normal Continent Weight: 46.8 kg Height:  5\' 2"  (157.5 cm)  BEHAVIORAL SYMPTOMS/MOOD NEUROLOGICAL BOWEL NUTRITION STATUS      Continent Diet (Regular)  AMBULATORY STATUS COMMUNICATION OF NEEDS Skin   Limited Assist Verbally Normal                       Personal Care Assistance Level of Assistance  Bathing, Feeding, Dressing  Bathing Assistance: Limited assistance Feeding assistance: Limited assistance Dressing Assistance: Limited assistance     Functional Limitations Info  Hearing, Speech, Sight Sight Info: Adequate Hearing Info: Adequate Speech Info: Adequate    SPECIAL CARE FACTORS FREQUENCY  PT (By licensed PT), OT (By licensed OT)     PT Frequency: 5x week OT Frequency: 5x week            Contractures Contractures Info: Not present    Additional Factors Info  Code Status, Allergies Code Status Info: DNR Allergies Info: Codeine           Current Medications (08/01/2019):  This is the current hospital active medication list Current Facility-Administered Medications  Medication Dose Route Frequency Provider Last Rate Last Admin  . 0.9 %  sodium chloride infusion   Intravenous PRN Elodia Florence., MD 10 mL/hr at 08/01/19 0600 Rate Verify at 08/01/19 0600  . acetaminophen (TYLENOL) tablet 650 mg  650 mg Oral Q6H PRN Elodia Florence., MD       Or  . acetaminophen (TYLENOL) suppository 650 mg  650 mg Rectal Q6H PRN Elodia Florence., MD      . buPROPion (WELLBUTRIN XL) 24 hr tablet 150 mg  150 mg Oral Daily Elodia Florence., MD   150 mg at 08/01/19 0956  . carvedilol (COREG) tablet 25 mg  25 mg Oral BID WC Elodia Florence., MD   25 mg at 08/01/19 0802  .  Chlorhexidine Gluconate Cloth 2 % PADS 6 each  6 each Topical Daily Elodia Florence., MD   6 each at 08/01/19 1113  . diltiazem (CARDIZEM) 125 mg in dextrose 5% 125 mL (1 mg/mL) infusion  5-15 mg/hr Intravenous Titrated Elodia Florence., MD 5 mL/hr at 08/01/19 0600 5 mg/hr at 08/01/19 0600  . escitalopram (LEXAPRO) tablet 10 mg  10 mg Oral Daily Elodia Florence., MD   10 mg at 08/01/19 0954  . ferrous sulfate tablet 325 mg  325 mg Oral Q breakfast Elodia Florence., MD   325 mg at 08/01/19 0801  . heparin ADULT infusion 100 units/mL (25000 units/248mL sodium chloride 0.45%)  700 Units/hr  Intravenous Continuous Angela Adam, RPH 7 mL/hr at 08/01/19 1345 700 Units/hr at 08/01/19 1345  . insulin aspart (novoLOG) injection 0-5 Units  0-5 Units Subcutaneous QHS Elodia Florence., MD   3 Units at 07/31/19 2131  . insulin aspart (novoLOG) injection 0-9 Units  0-9 Units Subcutaneous TID WC Elodia Florence., MD   2 Units at 08/01/19 1234  . insulin aspart (novoLOG) injection 2 Units  2 Units Subcutaneous TID WC Elodia Florence., MD   2 Units at 07/31/19 1750  . insulin glargine (LANTUS) injection 5 Units  5 Units Subcutaneous Daily Elodia Florence., MD   5 Units at 08/01/19 1004  . labetalol (NORMODYNE) injection 10 mg  10 mg Intravenous Q2H PRN Opyd, Ilene Qua, MD   10 mg at 07/29/19 1938  . loperamide (IMODIUM) capsule 2 mg  2 mg Oral QID Elodia Florence., MD   2 mg at 08/01/19 1353  . mesalamine (LIALDA) EC tablet 2.4 g  2.4 g Oral Q breakfast Elodia Florence., MD   2.4 g at 08/01/19 0801  . methylPREDNISolone sodium succinate (SOLU-MEDROL) 40 mg/mL injection 40 mg  40 mg Intravenous Daily Elodia Florence., MD   40 mg at 08/01/19 0950  . metoprolol tartrate (LOPRESSOR) injection 2.5 mg  2.5 mg Intravenous Q5 min PRN Elodia Florence., MD   2.5 mg at 07/31/19 1813  . mirabegron ER (MYRBETRIQ) tablet 50 mg  50 mg Oral QHS Elodia Florence., MD   50 mg at 07/31/19 2131  . morphine 2 MG/ML injection 1 mg  1 mg Intravenous Q4H PRN Elodia Florence., MD   1 mg at 07/27/19 1937  . ondansetron (ZOFRAN) tablet 4 mg  4 mg Oral Q6H PRN Elodia Florence., MD       Or  . ondansetron [facility]) injection 4 mg  4 mg Intravenous Q6H PRN Elodia Florence., MD      . oxyCODONE (Oxy IR/ROXICODONE) immediate release tablet 2.5 mg  2.5 mg Oral Q4H PRN Elodia Florence., MD   2.5 mg at 07/28/19 0940  . pantoprazole (PROTONIX) EC tablet 40 mg  40 mg Oral Daily Elodia Florence., MD   40 mg at 08/01/19 0957  . piperacillin-tazobactam  (ZOSYN) IVPB 2.25 g  2.25 g Intravenous Q6H Mariel Aloe, MD      . pregabalin (LYRICA) capsule 75 mg  75 mg Oral BID Elodia Florence., MD   75 mg at 08/01/19 0953  . vitamin B-12 (CYANOCOBALAMIN) tablet 1,000 mcg  1,000 mcg Oral Daily Elodia Florence., MD   1,000 mcg at 08/01/19 5631     Discharge Medications: Please see  discharge summary for a list of discharge medications.  Relevant Imaging Results:  Relevant Lab Results:   Additional Information [id number] Juliann Pulse, [place]

## 2019-08-01 NOTE — Progress Notes (Signed)
[place] for heparin Indication: atrial fibrillation  Allergies  Allergen Reactions  . Codeine Nausea Only    Patient Measurements: Height: 5\' 2"  (157.5 cm) Weight: 46.8 kg (103 lb 3.2 oz) IBW/kg (Calculated) : 50.1 Heparin Dosing Weight: n/a. Use TBW = 47 kg  Vital Signs: Temp: 98.7 F (37.1 C) (06/23 1227) Temp Source: Oral (06/23 0430) BP: 108/63 (06/23 0430) Pulse Rate: 84 (06/23 0430)  Labs: Recent Labs    07/30/19 0518 07/30/19 2216 07/31/19 0746 07/31/19 0746 07/31/19 1837 08/01/19 0425 08/01/19 1207  HGB 10.8*  --  12.1  --   --  12.1  --   HCT 32.1*  --  36.3  --   --  36.6  --   PLT 160  --  194  --   --  215  --   HEPARINUNFRC  --    < > 0.71*   < > 0.13* 0.33 0.20*  CREATININE 1.04*  --  1.45*  --   --  1.70*  --    < > = values in this interval not displayed.    Estimated Creatinine Clearance: 17.2 mL/min (A) (by C-G formula based on SCr of 1.7 mg/dL (H)).   Medical History: Past Medical History:  Diagnosis Date  . Arthritis    all over  . Cancer ([place])    breast  . Depression   . Diabetes mellitus without complication (HCC)    diet controlled  . Hypertension       Assessment: 86 y.o.femalewith medical history significant ofT2DM, HTN, depression, breast cancer, inflammatory diarrhea who presents with worsening symptoms of diarrhea.  Patient developed atrial fibrillation with RVR, denies any chest pain but notes some SOB.  Pharmacy consulted to dose heparin.  No prior AC noted.  Today, 08/01/19  CBC: Hgb & Plt both stable, WNL  HL = 0.20 subtherapeutic on heparin infusion of 600 units/hr  Confirmed with RN that heparin infusing at correct rate with no interruptions. No signs/symptoms of bleeding.   Confirmed that HL drawn from right hand appropriately   Goal of Therapy:  Heparin level 0.3-0.7 units/ml Monitor platelets by anticoagulation protocol:   Plan:   Increase heparin to 700  units/hr  Check  HL in 8 hours  Daily HL, CBC   Dolly Rias RPh 08/01/2019, 1:07 PM

## 2019-08-01 NOTE — Progress Notes (Signed)
PROGRESS NOTE    Deanna [name]  XHB:[id number] DOB: 08-Sep-1930 DOA: 07/27/2019 PCP: Marton Redwood, MD   Brief Narrative: Deanna [name] is a 86 y.o. female with a history of type 2 diabetes, hypertension, depression, breast cancer, inflammatory diarrhea.  She presented secondary to worsening diarrhea over the last 6 weeks.  Initial work-up for infectious etiology was negative.  Concern that this is a recurrent inflammatory process.  Patient has improved during her hospital stay.  GI consulted for further evaluation.   Assessment & Plan:   Active Problems:   Colitis   Atrial fibrillation with RVR (HCC)   Acute respiratory failure with hypoxia (HCC)   Sepsis without acute organ dysfunction (HCC)   Acute colitis Initial concern for infection versus inflammatory process.  Patient with a history of inflammatory disease.  GI pathogen panel and C. difficile testing have been negative.  Patient started on Solu-Medrol IV with improvement in symptoms.  Gastroenterology was consulted for evaluation and possible intervention. -Continue steroids -Gastroenterology recommendations: Plan for colonoscopy on 6/24  Sepsis Present on admission.  Secondary to acute colitis.  Physiology resolved.  Blood cultures with no growth.  Atrial fibrillation with RVR New onset.  Echo obtained on 6/22 which was significant for normal EF and atria.  Patient started on diltiazem drip in addition to heparin drip.  Cardiology consulted on 6/23. -Continue diltiazem drip in addition to heparin drip -Cardiology consult and will follow recommendations  Pulmonary edema Acute on chronic diastolic heart failure Patient was treated with IV Lasix with prompt improvement on repeat chest imaging.  CKD stage IIIa Baseline creatinine of 1.6.  Creatinine of 1.77 on presentation with improvement down to 1.04. Now back up towards baseline possibly secondary to overdiuresis. -Lasix stopped.  Mass of the mid  mesentery Seen on imaging.  Concern for possible neoplastic process.  Per chart review, discussed with oncology who recommends outpatient oncology work-up.  Iron deficiency anemia -Continue iron supplementation  Diabetes mellitus, type II Hemoglobin A1c of 6.3%.  Lantus started secondary to steroid initiation. -Continue Lantus and SSI  Peripheral neuropathy Patient is on Lyrica as an outpatient.  She is on on a 150 mg twice daily. -Continue Lyrica 75 mg twice daily (renally dosed)  Depression -Continue Wellbutrin and Lexapro  Essential hypertension Antihypertensives initially held secondary to sepsis.  Blood pressure is currently controlled. -Continue Coreg 25 mg twice daily -Diltiazem as mentioned above  Hypokalemia -Replete  Urinary retention Patient failed voiding trial.  Foley replaced on 6/22.  Unsure of etiology.  Patient will need to follow-up with urology as an outpatient.   DVT prophylaxis: Heparin drip Code Status:   Code Status: DNR Family Communication: None at bedside Disposition Plan: Discharge to SNF likely in several days pending completion of work-up by cardiology in gastroenterology   Consultants:   Gastroenterology  Cardiology  Procedures:   TRANSTHORACIC ECHOCARDIOGRAM (08/01/2019) IMPRESSIONS    1. Left ventricular ejection fraction, by estimation, is 60 to 65%. The  left ventricle has normal function. The left ventricle has no regional  wall motion abnormalities. There is moderate concentric left ventricular  hypertrophy. Left ventricular  diastolic parameters are indeterminate. Elevated left ventricular  end-diastolic pressure.  2. Right ventricular systolic function is normal. The right ventricular  size is normal. There is mildly elevated pulmonary artery systolic  pressure. The estimated right ventricular systolic pressure is 81.0 mmHg.  3. The mitral valve is normal in structure. Mild mitral valve  regurgitation. No evidence of  mitral stenosis.  4. Tricuspid valve regurgitation is moderate.  5. The aortic valve is tricuspid. Aortic valve regurgitation is not  visualized. Mild to moderate aortic valve sclerosis/calcification is  present, without any evidence of aortic stenosis.  6. The inferior vena cava is normal in size with greater than 50%  respiratory variability, suggesting right atrial pressure of 3 mmHg.   Antimicrobials:  None    Subjective: Improvement in diarrhea. No abdominal pain.   Objective: Vitals:   07/31/19 2104 08/01/19 0030 08/01/19 0430 08/01/19 0452  BP:  107/73 108/63   Pulse:  78 84   Resp:  14 (!) 23   Temp: 98 F (36.7 C)  98.4 F (36.9 C)   TempSrc: Oral  Oral   SpO2:  95% 97%   Weight:    46.8 kg  Height:        Intake/Output Summary (Last 24 hours) at 08/01/2019 0951 Last data filed at 08/01/2019 0600 Gross per 24 hour  Intake 301.16 ml  Output 1775 ml  Net -1473.84 ml   Filed Weights   07/30/19 0500 07/31/19 0500 08/01/19 0452  Weight: 48 kg 47.4 kg 46.8 kg    Examination:  General exam: Appears calm and comfortable Respiratory system: Clear to auscultation. Respiratory effort normal. Cardiovascular system: S1 & S2 heard, Irregular rhythm, normal rate. No murmurs, rubs, gallops or clicks. Gastrointestinal system: Abdomen is nondistended, soft and nontender. No organomegaly or masses felt. Normal bowel sounds heard. Central nervous system: Alert and oriented. No focal neurological deficits. Musculoskeletal: No edema. No calf tenderness Skin: No cyanosis. No rashes Psychiatry: Judgement and insight appear normal. Mood & affect appropriate.     Data Reviewed: I have personally reviewed following labs and imaging studies  CBC Lab Results  Component Value Date   WBC 18.9 (H) 08/01/2019   RBC 4.35 08/01/2019   HGB 12.1 08/01/2019   HCT 36.6 08/01/2019   MCV 84.1 08/01/2019   MCH 27.8 08/01/2019   PLT 215 08/01/2019   MCHC 33.1 08/01/2019   RDW 14.6  08/01/2019   LYMPHSABS 3.3 07/30/2019   MONOABS 0.8 07/30/2019   EOSABS 0.0 07/30/2019   BASOSABS 0.0 43/32/9518     Last metabolic panel Lab Results  Component Value Date   NA 140 08/01/2019   K 3.1 (L) 08/01/2019   CL 103 08/01/2019   CO2 25 08/01/2019   BUN 41 (H) 08/01/2019   CREATININE 1.70 (H) 08/01/2019   GLUCOSE 197 (H) 08/01/2019   GFRNONAA 27 (L) 08/01/2019   GFRAA 31 (L) 08/01/2019   CALCIUM 9.2 08/01/2019   PHOS 2.9 08/01/2019   PROT 6.8 08/01/2019   ALBUMIN 3.3 (L) 08/01/2019   BILITOT 0.7 08/01/2019   ALKPHOS 55 08/01/2019   AST 13 (L) 08/01/2019   ALT 16 08/01/2019   ANIONGAP 12 08/01/2019    CBG (last 3)  Recent Labs    07/31/19 1632 07/31/19 2101 08/01/19 0722  GLUCAP 264* 280* 160*     GFR: Estimated Creatinine Clearance: 17.2 mL/min (A) (by C-G formula based on SCr of 1.7 mg/dL (H)).  Coagulation Profile: Recent Labs  Lab 07/27/19 1414  INR 1.2    Recent Results (from the past 240 hour(s))  Urine culture     Status: None   Collection Time: 07/27/19  1:45 AM   Specimen: Urine, Random  Result Value Ref Range Status   Specimen Description   Final    URINE, RANDOM Performed at [facility] [address] [place], [place] [address]  Special Requests   Final    NONE Performed at [facility], [place] [address], [place], [place] [address]    Culture   Final    NO GROWTH Performed at [facility], [place] [address] [place], [place] [address]    Report Status 07/29/2019 FINAL  Final  Culture, blood (Routine X 2) w Reflex to ID Panel     Status: None   Collection Time: 07/27/19  1:24 PM   Specimen: BLOOD RIGHT HAND  Result Value Ref Range Status   Specimen Description   Final    BLOOD RIGHT HAND Performed at [facility], [place] [address], [place], [place] [address]    Special Requests   Final    BOTTLES DRAWN AEROBIC AND ANAEROBIC Blood Culture adequate volume Performed at [facility] [address], [place], [place] [address]    Culture   Final    NO GROWTH 5 DAYS Performed at [facility], [place] [address] [place], [place] [address]    Report Status 08/01/2019 FINAL  Final  Culture, blood (Routine X 2) w Reflex to ID Panel     Status: None   Collection Time: 07/27/19  1:29 PM   Specimen: BLOOD  Result Value Ref Range Status   Specimen Description   Final    BLOOD LEFT ANTECUBITAL Performed at [place] [address] [place], [place] [address]    Special Requests   Final    BOTTLES DRAWN AEROBIC AND ANAEROBIC Blood Culture results may not be optimal due to an inadequate volume of blood received in culture bottles   Culture   Final    NO GROWTH 5 DAYS Performed at [facility], [place] [address], [place], [place] [address]    Report Status 08/01/2019 FINAL  Final  SARS Coronavirus 2 by RT PCR (hospital order, performed in [facility] hospital lab) Nasopharyngeal Nasopharyngeal Swab     Status: None   Collection Time: 07/27/19  4:21 PM   Specimen: Nasopharyngeal Swab  Result Value Ref Range Status   SARS Coronavirus 2 NEGATIVE NEGATIVE Final    Comment: (NOTE) SARS-CoV-2 target nucleic acids are NOT DETECTED.  The SARS-CoV-2 RNA is generally detectable in upper and lower respiratory specimens during the acute phase of infection. The lowest concentration of SARS-CoV-2 viral copies this assay can detect is 250 copies / mL. A negative result does not preclude SARS-CoV-2 infection and should not be used as the sole basis for treatment or other patient management decisions.  A negative result may occur with improper specimen collection / handling, submission of specimen other than nasopharyngeal swab, presence of viral mutation(s) within the areas targeted by this assay, and inadequate number of viral copies (<250 copies / mL). A negative result must be combined with clinical observations, patient  history, and epidemiological information.  Fact Sheet for Patients:   StrictlyIdeas.no  Fact Sheet for Healthcare Providers: BankingDealers.co.za  This test is not yet approved or  cleared by the [place] FDA and has been authorized for detection and/or diagnosis of SARS-CoV-2 by FDA under an Emergency Use Authorization (EUA).  This EUA will remain in effect (meaning this test can be used) for the duration of the COVID-19 declaration under Section 564(b)(1) of the Act, 21 U.S.C. section 360bbb-3(b)(1), unless the authorization is terminated or revoked sooner.  Performed at [facility], [place] [address], [place],  [address]   C Difficile  Quick Screen w PCR reflex     Status: None   Collection Time: 07/27/19  5:38 PM   Specimen: STOOL  Result Value Ref Range Status   C Diff antigen NEGATIVE NEGATIVE Final   C Diff toxin NEGATIVE NEGATIVE Final   C Diff interpretation No C. difficile detected.  Final    Comment: VALID Performed at [facility], [place] [address] [place], [place] [address]   Gastrointestinal Panel by PCR , Stool     Status: None   Collection Time: 07/27/19  5:38 PM   Specimen: STOOL  Result Value Ref Range Status   Campylobacter species NOT DETECTED NOT DETECTED Final   Plesimonas shigelloides NOT DETECTED NOT DETECTED Final   Salmonella species NOT DETECTED NOT DETECTED Final   Yersinia enterocolitica NOT DETECTED NOT DETECTED Final   Vibrio species NOT DETECTED NOT DETECTED Final   Vibrio cholerae NOT DETECTED NOT DETECTED Final   Enteroaggregative E coli (EAEC) NOT DETECTED NOT DETECTED Final   Enteropathogenic E coli (EPEC) NOT DETECTED NOT DETECTED Final   Enterotoxigenic E coli (ETEC) NOT DETECTED NOT DETECTED Final   Shiga like toxin producing E coli (STEC) NOT DETECTED NOT DETECTED Final   Shigella/Enteroinvasive E coli (EIEC) NOT DETECTED NOT DETECTED Final    Cryptosporidium NOT DETECTED NOT DETECTED Final   Cyclospora cayetanensis NOT DETECTED NOT DETECTED Final   Entamoeba histolytica NOT DETECTED NOT DETECTED Final   Giardia lamblia NOT DETECTED NOT DETECTED Final   Adenovirus F40/41 NOT DETECTED NOT DETECTED Final   Astrovirus NOT DETECTED NOT DETECTED Final   Norovirus GI/GII NOT DETECTED NOT DETECTED Final   Rotavirus A NOT DETECTED NOT DETECTED Final   Sapovirus (I, II, IV, and V) NOT DETECTED NOT DETECTED Final    Comment: Performed at [facility], [address], [place], [place] [address]        Radiology Studies: DG CHEST PORT 1 VIEW  Result Date: 07/31/2019 CLINICAL DATA:  Shortness of breath.  History of breast cancer. EXAM: PORTABLE CHEST 1 VIEW COMPARISON:  07/31/2019 FINDINGS: Lower cervical plate and screw fixator. Left axillary clips noted along with left mastectomy. Atherosclerotic calcification of the aortic arch. The lungs appear clear. Levoconvex thoracolumbar scoliosis which may be positional. Heart size within normal limits. The patient is rotated to the right on today's radiograph, reducing diagnostic sensitivity and specificity. IMPRESSION: 1. No acute findings. 2.  Aortic Atherosclerosis (ICD10-I70.0). Electronically Signed   By: Van Clines M.D.   On: 07/31/2019 19:01   DG CHEST PORT 1 VIEW  Result Date: 07/31/2019 CLINICAL DATA:  Hypoxia. EXAM: PORTABLE CHEST 1 VIEW COMPARISON:  07/30/2019 FINDINGS: Normal heart size. No pleural effusion. Interval improvement in interstitial edema. No airspace consolidation identified. Previous ACDF within the lower cervical spine. IMPRESSION: Interval improvement in interstitial edema. Electronically Signed   By: Kerby Moors M.D.   On: 07/31/2019 09:15   ECHOCARDIOGRAM COMPLETE  Result Date: 07/31/2019    ECHOCARDIOGRAM REPORT   Patient Name:   Deanna [name] Date of Exam: 07/31/2019 Medical Rec #:  [id number]         Height:       62.0 in Accession #:     [id number]        Weight:       104.6 lb Date of Birth:  1930-05-18         BSA:          1.451 m Patient Age:    26 years  BP:           128/78 mmHg Patient Gender: F                 HR:           103 bpm. Exam Location:  Inpatient Procedure: 2D Echo, Cardiac Doppler and Color Doppler Indications:    Atrial fibrillation  History:        Patient has prior history of Echocardiogram examinations, most                 recent 09/29/2016. Arrythmias:Atrial Fibrillation; Risk                 Factors:Diabetes and Hypertension. Sepsis, Breast cancer.  Sonographer:    Dustin Flock Referring Phys: [phone] A CALDWELL [place]  1. Left ventricular ejection fraction, by estimation, is 60 to 65%. The left ventricle has normal function. The left ventricle has no regional wall motion abnormalities. There is moderate concentric left ventricular hypertrophy. Left ventricular diastolic parameters are indeterminate. Elevated left ventricular end-diastolic pressure.  2. Right ventricular systolic function is normal. The right ventricular size is normal. There is mildly elevated pulmonary artery systolic pressure. The estimated right ventricular systolic pressure is 28.4 mmHg.  3. The mitral valve is normal in structure. Mild mitral valve regurgitation. No evidence of mitral stenosis.  4. Tricuspid valve regurgitation is moderate.  5. The aortic valve is tricuspid. Aortic valve regurgitation is not visualized. Mild to moderate aortic valve sclerosis/calcification is present, without any evidence of aortic stenosis.  6. The inferior vena cava is normal in size with greater than 50% respiratory variability, suggesting right atrial pressure of 3 mmHg. FINDINGS  Left Ventricle: Left ventricular ejection fraction, by estimation, is 60 to 65%. The left ventricle has normal function. The left ventricle has no regional wall motion abnormalities. The left ventricular internal cavity size was normal in size. There is   moderate concentric left ventricular hypertrophy. Left ventricular diastolic parameters are indeterminate. Elevated left ventricular end-diastolic pressure. Right Ventricle: The right ventricular size is normal. No increase in right ventricular wall thickness. Right ventricular systolic function is normal. There is mildly elevated pulmonary artery systolic pressure. The tricuspid regurgitant velocity is 3.08  m/s, and with an assumed right atrial pressure of 3 mmHg, the estimated right ventricular systolic pressure is 13.2 mmHg. Left Atrium: Left atrial size was normal in size. Right Atrium: Right atrial size was normal in size. Pericardium: There is no evidence of pericardial effusion. Mitral Valve: The mitral valve is normal in structure. There is mild calcification of the mitral valve leaflet(s). Normal mobility of the mitral valve leaflets. Mild to moderate mitral annular calcification. Mild mitral valve regurgitation. No evidence of mitral valve stenosis. Tricuspid Valve: The tricuspid valve is normal in structure. Tricuspid valve regurgitation is moderate . No evidence of tricuspid stenosis. Aortic Valve: The aortic valve is tricuspid. Aortic valve regurgitation is not visualized. Mild to moderate aortic valve sclerosis/calcification is present, without any evidence of aortic stenosis. Pulmonic Valve: The pulmonic valve was normal in structure. Pulmonic valve regurgitation is not visualized. No evidence of pulmonic stenosis. Aorta: The aortic root is normal in size and structure. Venous: The inferior vena cava is normal in size with greater than 50% respiratory variability, suggesting right atrial pressure of 3 mmHg. IAS/Shunts: No atrial level shunt detected by color flow Doppler.  LEFT VENTRICLE PLAX 2D LVIDd:         2.70 cm  Diastology LVIDs:  1.90 cm  LV e' lateral:   4.64 cm/s LV PW:         1.30 cm  LV E/e' lateral: 18.2 LV IVS:        1.30 cm  LV e' medial:    3.77 cm/s LVOT diam:     1.70 cm   LV E/e' medial:  22.4 LV SV:         41 LV SV Index:   28 LVOT Area:     2.27 cm  RIGHT VENTRICLE RV Basal diam:  2.90 cm RV S prime:     10.60 cm/s TAPSE (M-mode): 1.9 cm LEFT ATRIUM             Index       RIGHT ATRIUM           Index LA diam:        4.10 cm 2.82 cm/m  RA Area:     12.40 cm LA Vol (A2C):   34.1 ml 23.49 ml/m RA Volume:   29.40 ml  20.26 ml/m LA Vol (A4C):   60.4 ml 41.62 ml/m LA Biplane Vol: 49.6 ml 34.17 ml/m  AORTIC VALVE LVOT Vmax:   95.30 cm/s LVOT Vmean:  71.600 cm/s LVOT VTI:    0.180 m  AORTA Ao Root diam: 2.50 cm MITRAL VALVE               TRICUSPID VALVE MV Area (PHT): 5.54 cm    TR Peak grad:   37.9 mmHg MV Decel Time: 137 msec    TR Vmax:        308.00 cm/s MV E velocity: 84.40 cm/s                            SHUNTS                            Systemic VTI:  0.18 m                            Systemic Diam: 1.70 cm Fransico Him MD Electronically signed by Fransico Him MD Signature Date/Time: 07/31/2019/1:36:53 PM    Final         Scheduled Meds: . buPROPion  150 mg Oral Daily  . carvedilol  25 mg Oral BID WC  . Chlorhexidine Gluconate Cloth  6 each Topical Daily  . escitalopram  10 mg Oral Daily  . ferrous sulfate  325 mg Oral Q breakfast  . insulin aspart  0-5 Units Subcutaneous QHS  . insulin aspart  0-9 Units Subcutaneous TID WC  . insulin aspart  2 Units Subcutaneous TID WC  . insulin glargine  5 Units Subcutaneous Daily  . loperamide  2 mg Oral QID  . mesalamine  2.4 g Oral Q breakfast  . methylPREDNISolone (SOLU-MEDROL) injection  40 mg Intravenous Daily  . mirabegron ER  50 mg Oral QHS  . pantoprazole  40 mg Oral Daily  . pregabalin  75 mg Oral BID  . vitamin B-12  1,000 mcg Oral Daily   Continuous Infusions: . sodium chloride 10 mL/hr at 08/01/19 0600  . diltiazem (CARDIZEM) infusion 5 mg/hr (08/01/19 0600)  . heparin 600 Units/hr (08/01/19 0600)  . piperacillin-tazobactam (ZOSYN)  IV 3.375 g (08/01/19 0319)     LOS: 5 days     Cordelia Poche, MD [facility]  08/01/2019, 9:51 AM  If 7PM-7AM, please contact night-coverage [email or web address]

## 2019-08-01 NOTE — Progress Notes (Signed)
PHARMACY NOTE:  ANTIMICROBIAL RENAL DOSAGE ADJUSTMENT  Current antimicrobial regimen includes a mismatch between antimicrobial dosage and estimated renal function.  As per policy approved by the Pharmacy & Therapeutics and Medical Executive Committees, the antimicrobial dosage will be adjusted accordingly.  Current antimicrobial dosage:  Zosyn 3.375gm IV q8h extended enterval  Indication: intra-abdominal infection  Renal Function:  Estimated Creatinine Clearance: 17.2 mL/min (A) (by C-G formula based on SCr of 1.7 mg/dL (H)). []      On intermittent HD, scheduled: []      On CRRT    Antimicrobial dosage has been changed to:  Zosyn 2.25gm IV q6h  Additional comments:   Thank you for allowing pharmacy to be a part of this patient's care.  Dolly Rias RPh 08/01/2019, 1:33 PM

## 2019-08-01 NOTE — Progress Notes (Signed)
Cardiology evaluated patient earlier today. They are okay with proceeding with colonoscopy as long as heart rate controlled. Heart rate has been controlled to day. I tried to call daughter to discuss proceeding with colonoscopy tomorrow but unable to reach her. Will prep for colonoscopy to be done tomorrow. Will need to to hold IV heparin 4 hours prior to procedure. Endo can do procedure at 11am, hold heparin at 7 am.

## 2019-08-01 NOTE — Progress Notes (Addendum)
Physical Therapy Treatment Patient Details Name: Deanna [name] MRN: [id number] DOB: [birth year] Today's Date: 08/01/2019    History of Present Illness 86 y.o. female with medical history significant of T2DM, HTN, depression, breast cancer, inflammatory diarrhea who presents with worsening symptoms of diarrhea for the last 6 weeks.  Pt admitted for Sepsis 2/2 Acute Colitis  Diarrhea  History of Inflammatory Diarrhea    PT Comments    Per RN, pt with controlled afib and wanting to wean supplemental O2, OK for therapy. Pt tolerates coming to sitting at EOB without physical assist. Pt tolerates sitting EOB with lab drawing blood, cues to shift weight anterior to avoid posterior pelvic tilt and posterior lean with good improvement. Pt able to rise with cues to push with BLE and upright trunk. Pt ambulates short distance with RW, demonstrating slow, short, narrow steps using RW to maintain steadiness and min A from therapist. Pt denies pain, dizziness, SOB or chest pain with amb; reports SOB at end of ambulation but able to recover to baseline within 2 minutes of seated rest. Pt's HR 70-80s and SpO2 >94% with mobility on RA. Pt's daughter in room during session encouraging pt; agrees SNF would be best to regain strength and confidence with mobility prior to return to ILF due to only receiving ~2hrs assist 1day/wk. RN and nurse tech in room at [place]. Patient will benefit from continued physical therapy in hospital and recommendations below to increase strength, balance, endurance for safe ADLs and gait.   Follow Up Recommendations  SNF;Supervision for mobility/OOB     Equipment Recommendations  None recommended by PT    Recommendations for Other Services       Precautions / Restrictions Precautions Precautions: Fall Precaution Comments: monitor HR Restrictions Weight Bearing Restrictions: No    Mobility  Bed Mobility Overal bed mobility: Needs Assistance Bed Mobility: Supine to Sit;Sit  to Supine  Supine to sit: Min guard;HOB elevated Sit to supine: Min assist  General bed mobility comments: slow, labored movement to come to sitting at EOB with use of bedrail; min A to return to supine for BLE lifting back into bed  Transfers Overall transfer level: Needs assistance Equipment used: Rolling walker (2 wheeled) Transfers: Sit to/from Stand Sit to Stand: Min guard  General transfer comment: slow, steady rising with BUE assisting to power up  Ambulation/Gait Ambulation/Gait assistance: Min assist Gait Distance (Feet): 40 Feet Assistive device: Rolling walker (2 wheeled) Gait Pattern/deviations: Step-through pattern;Decreased stride length;Narrow base of support Gait velocity: decreased   General Gait Details: short, slow, narrow steps with RW steadying, slight unsteadiness with turns but able to recover independently, SpO2 94-98% on RA and HR 70-80s   Stairs             Wheelchair Mobility    Modified Rankin (Stroke Patients Only)       Balance Overall balance assessment: Needs assistance Sitting-balance support: Feet supported;Bilateral upper extremity supported Sitting balance-Leahy Scale: Good Sitting balance - Comments: seated EOB with posterior pelvic tilt and "slouched posture"   Standing balance support: During functional activity;Bilateral upper extremity supported Standing balance-Leahy Scale: Poor Standing balance comment: reliant on RW for support     Cognition Arousal/Alertness: Awake/alert Behavior During Therapy: WFL for tasks assessed/performed Overall Cognitive Status: Within Functional Limits for tasks assessed         Exercises      General Comments        Pertinent Vitals/Pain Pain Assessment: No/denies pain    Home Living  Prior Function            PT Goals (current goals can now be found in the care plan section) Acute Rehab PT Goals PT Goal Formulation: With patient Time For Goal  Achievement: 08/13/19 Potential to Achieve Goals: Good Progress towards PT goals: Progressing toward goals    Frequency    Min 3X/week      PT Plan Current plan remains appropriate    Co-evaluation              AM-PAC PT "6 Clicks" Mobility   Outcome Measure  Help needed turning from your back to your side while in a flat bed without using bedrails?: A Little Help needed moving from lying on your back to sitting on the side of a flat bed without using bedrails?: A Little Help needed moving to and from a bed to a chair (including a wheelchair)?: A Little Help needed standing up from a chair using your arms (e.g., wheelchair or bedside chair)?: A Little Help needed to walk in hospital room?: A Little Help needed climbing 3-5 steps with a railing? : A Little 6 Click Score: 18    End of Session Equipment Utilized During Treatment: Gait belt Activity Tolerance: Patient tolerated treatment well Patient left: in bed;with call bell/phone within reach;with nursing/sitter in room;with family/visitor present Nurse Communication: Mobility status;Other (comment) (HR and O2) PT Visit Diagnosis: Other abnormalities of gait and mobility (R26.89)     Time: 7353-2992 (-10 minutes lab in room drawing blood) PT Time Calculation (min) (ACUTE ONLY): 36 min  Charges:  $Gait Training: 8-22 mins $Therapeutic Activity: 8-22 mins                      Tori Keigan Girten PT, DPT 08/01/19, 12:41 PM

## 2019-08-02 ENCOUNTER — Encounter (HOSPITAL_COMMUNITY)
Admission: EM | Disposition: A | Discharge status: Skilled Nursing Facility | Payer: Self-pay | Source: Home / Self Care | Attending: Family Medicine

## 2019-08-02 ENCOUNTER — Inpatient Hospital Stay (HOSPITAL_COMMUNITY): Payer: Medicare Other | Admitting: Certified Registered"

## 2019-08-02 DIAGNOSIS — R197 Diarrhea, unspecified: Secondary | ICD-10-CM

## 2019-08-02 DIAGNOSIS — K633 Ulcer of intestine: Secondary | ICD-10-CM

## 2019-08-02 HISTORY — PX: COLONOSCOPY WITH PROPOFOL: SHX5780

## 2019-08-02 HISTORY — PX: BIOPSY: SHX5522

## 2019-08-02 LAB — BASIC METABOLIC PANEL
Anion gap: 13 (ref 5–15)
BUN: 27 mg/dL — ABNORMAL HIGH (ref 8–23)
CO2: 19 mmol/L — ABNORMAL LOW (ref 22–32)
Calcium: 9.3 mg/dL (ref 8.9–10.3)
Chloride: 109 mmol/L (ref 98–111)
Creatinine, Ser: 1.39 mg/dL — ABNORMAL HIGH (ref 0.44–1.00)
GFR calc Af Amer: 39 mL/min — ABNORMAL LOW (ref >60)
GFR calc non Af Amer: 34 mL/min — ABNORMAL LOW (ref >60)
Glucose, Bld: 166 mg/dL — ABNORMAL HIGH (ref 70–99)
Potassium: 2.9 mmol/L — ABNORMAL LOW (ref 3.5–5.1)
Sodium: 141 mmol/L (ref 135–145)

## 2019-08-02 LAB — CBC
HCT: 39.2 % (ref 36.0–46.0)
Hemoglobin: 12.7 g/dL (ref 12.0–15.0)
MCH: 27.9 pg (ref 26.0–34.0)
MCHC: 32.4 g/dL (ref 30.0–36.0)
MCV: 86.2 fL (ref 80.0–100.0)
Platelets: 247 10*3/uL (ref 150–400)
RBC: 4.55 MIL/uL (ref 3.87–5.11)
RDW: 14.9 % (ref 11.5–15.5)
WBC: 19.2 10*3/uL — ABNORMAL HIGH (ref 4.0–10.5)
nRBC: 0 % (ref 0.0–0.2)

## 2019-08-02 LAB — GLUCOSE, CAPILLARY
Glucose-Capillary: 174 mg/dL — ABNORMAL HIGH (ref 70–99)
Glucose-Capillary: 129 mg/dL — ABNORMAL HIGH (ref 70–99)
Glucose-Capillary: 207 mg/dL — ABNORMAL HIGH (ref 70–99)
Glucose-Capillary: 280 mg/dL — ABNORMAL HIGH (ref 70–99)

## 2019-08-02 LAB — HEPARIN LEVEL (UNFRACTIONATED): Heparin Unfractionated: 0.62 IU/mL (ref 0.30–0.70)

## 2019-08-02 LAB — MAGNESIUM: Magnesium: 1.7 mg/dL (ref 1.7–2.4)

## 2019-08-02 SURGERY — COLONOSCOPY WITH PROPOFOL
Anesthesia: Monitor Anesthesia Care

## 2019-08-02 MED ORDER — PROPOFOL 500 MG/50ML IV EMUL
INTRAVENOUS | Status: DC | PRN
  Administered 2019-08-02 (×2): 25 mg via INTRAVENOUS

## 2019-08-02 MED ORDER — PROPOFOL 500 MG/50ML IV EMUL
INTRAVENOUS | Status: DC | PRN
  Administered 2019-08-02: 125 ug/kg/min via INTRAVENOUS

## 2019-08-02 MED ORDER — LIDOCAINE HCL (CARDIAC) PF 100 MG/5ML IV SOSY
PREFILLED_SYRINGE | INTRAVENOUS | Status: DC | PRN
  Administered 2019-08-02: 50 mg via INTRATRACHEAL

## 2019-08-02 MED ORDER — LACTATED RINGERS IV SOLN
INTRAVENOUS | Status: AC | PRN
  Administered 2019-08-02: 1000 mL via INTRAVENOUS

## 2019-08-02 MED ORDER — POTASSIUM CHLORIDE CRYS ER 20 MEQ PO TBCR
40.0000 meq | EXTENDED_RELEASE_TABLET | ORAL | Status: AC
  Administered 2019-08-02 (×2): 40 meq via ORAL
  Filled 2019-08-02 (×2): qty 2

## 2019-08-02 MED ORDER — HEPARIN (PORCINE) 25000 UT/250ML-% IV SOLN
700.0000 [IU]/h | INTRAVENOUS | Status: DC
  Administered 2019-08-02: 700 [IU]/h via INTRAVENOUS
  Filled 2019-08-02: qty 250

## 2019-08-02 MED ORDER — PHENYLEPHRINE 40 MCG/ML (10ML) SYRINGE FOR IV PUSH (FOR BLOOD PRESSURE SUPPORT)
PREFILLED_SYRINGE | INTRAVENOUS | Status: DC | PRN
  Administered 2019-08-02: 160 ug via INTRAVENOUS
  Administered 2019-08-02: 120 ug via INTRAVENOUS
  Administered 2019-08-02: 60 ug via INTRAVENOUS
  Administered 2019-08-02: 120 ug via INTRAVENOUS
  Administered 2019-08-02: 160 ug via INTRAVENOUS
  Administered 2019-08-02: 120 ug via INTRAVENOUS
  Administered 2019-08-02: 160 ug via INTRAVENOUS

## 2019-08-02 MED ORDER — SPOT INK MARKER SYRINGE KIT
PACK | SUBMUCOSAL | Status: AC
  Filled 2019-08-02: qty 5

## 2019-08-02 SURGICAL SUPPLY — 22 items

## 2019-08-02 NOTE — Op Note (Signed)
[facility] Patient Name: Deanna [name] Procedure Date: 08/02/2019 MRN: [id number] Attending MD: Carlota Raspberry. [name] , MD Date of Birth: [birth year] CSN: [id number] Age: 86 Admit Type: Outpatient Procedure:                Colonoscopy Indications:              Chronic diarrhea, Abnormal CT of the GI tract -                            stool positive for lactoferrin, negative for                            infection, empirically treated per Dr. Earlean Shawl with                            Lialda, flare of symptoms, now on prednisone with                            symptomatic improved. Colonoscopy to rule out IBD /                            microscopic colitis. Providers:                Carlota Raspberry. Havery Moros, MD, Cleda Daub, RN, Corie Chiquito, Technician, Dayle Points, CRNA Referring MD:              Medicines:                Monitored Anesthesia Care Complications:            No immediate complications. Estimated blood loss:                            Minimal. Estimated Blood Loss:     Estimated blood loss was minimal. Procedure:                Pre-Anesthesia Assessment:                           - Prior to the procedure, a History and Physical                            was performed, and patient medications and                            allergies were reviewed. The patient's tolerance of                            previous anesthesia was also reviewed. The risks                            and benefits of the procedure and the sedation  options and risks were discussed with the patient.                            All questions were answered, and informed consent                            was obtained. Prior Anticoagulants: The patient has                            taken heparin, last dose was day of procedure. ASA                            Grade Assessment: III - A patient with severe                             systemic disease. After reviewing the risks and                            benefits, the patient was deemed in satisfactory                            condition to undergo the procedure.                           After obtaining informed consent, the colonoscope                            was passed under direct vision. Throughout the                            procedure, the patient's blood pressure, pulse, and                            oxygen saturations were monitored continuously. The                            [id number]-[id number] ([id number] Olympus pediatric colonscope                            was introduced through the anus and advanced to the                            the terminal ileum, with identification of the                            appendiceal orifice and IC valve. The colonoscopy                            was performed without difficulty. The patient                            tolerated the procedure well. The quality of the  bowel preparation was good. The terminal ileum,                            ileocecal valve, appendiceal orifice, and rectum                            were photographed. Scope In: 1:11:12 PM Scope Out: 1:42:57 PM Scope Withdrawal Time: 0 hours 23 minutes 23 seconds  Total Procedure Duration: 0 hours 31 minutes 45 seconds  Findings:      The perianal and digital rectal examinations were normal.      The terminal ileum appeared normal.      The mucosa at the appendiceal orifice was abnormal - could be cecal       patch of inflammation vs. sessile serrated polyp involving / invading       the AO. Biopsies were taken with a cold forceps for histology.      One small subepithelial nodule was found in the cecum (<1cm in size),       hard to palpation with forceps. Biopsies were taken with a cold forceps       for histology but suspect will be benign / nondiagnostic.      Multiple deep ulcers were found in the sigmoid colon with  adehrent       mucous. Multiple biopsies were taken with a cold forceps for histology.       The intervening mucosa was normal.      An area of scarred mucosa was found in the rectum.      The colon was extremely tortous. The rectal vault was small, no       retroflexed views obtained. The exam was otherwise without abnormality.      Biopsies for histology were taken with a cold forceps from the right       colon and transverse colon for evaluation of microscopic colitis. Impression:               - The examined portion of the ileum was normal.                           - Abnormal mucosa at the appendiceal orifice -                            suspect cecal patch of inflammation vs. sessile                            serrated polyp. Biopsied.                           - Small subepithelial nodule in the cecum. Biopsied.                           - Multiple large ulcers in the sigmoid colon.                            Biopsied.                           - Scarred mucosa in the rectum.                           -  The examination was otherwise normal.                           - Biopsies were taken with a cold forceps from the                            right colon and transverse colon for evaluation of                            microscopic colitis.                           Findings in the sigmoid colon suspicious for                            Crohn's colitis, especially given significant                            improvement with steroids and negative infectious                            workup with chronicity of her symptoms. If this                            represents Crohn's disease, biologic therapy would                            be warranted for treatment, given her age would                            consider Entyvio in light of favorable safety                            profile. Will await biopsies and discuss with                            patient. Moderate Sedation:       No moderate sedation, case performed with MAC Recommendation:           - Return patient to hospital ward for ongoing care.                           - Advance diet as tolerated.                           - Continue present medications.                           - Await pathology results.                           - Follow up with Dr. Earlean Shawl primary GI following                            discharge regarding long term management plan                           -  Can resume Heparin drip today Procedure Code(s):        --- Professional ---                           [phone], Colonoscopy, flexible; with biopsy, single                            or multiple Diagnosis Code(s):        --- Professional ---                           K63.89, Other specified diseases of intestine                           K62.89, Other specified diseases of anus and rectum                           K63.3, Ulcer of intestine                           K52.9, Noninfective gastroenteritis and colitis,                            unspecified                           R93.3, Abnormal findings on diagnostic imaging of                            other parts of digestive tract CPT copyright 2019 [employer]. All rights reserved. The codes documented in this report are preliminary and upon coder review may  be revised to meet current compliance requirements. Remo Lipps P. Havery Moros, MD 08/02/2019 1:55:48 PM This report has been signed electronically. Number of Addenda: 0

## 2019-08-02 NOTE — Progress Notes (Signed)
Received report from [facility].  No change in assessment. Continue plan of care. Stacey Drain

## 2019-08-02 NOTE — Plan of Care (Signed)
  Problem: Education: Goal: Knowledge of General Education information will improve Description Including pain rating scale, medication(s)/side effects and non-pharmacologic comfort measures Outcome: Progressing   

## 2019-08-02 NOTE — Care Management Important Message (Signed)
Important Message  Patient Details IM Letter given to Dessa Phi RN Case Manager to present to the Patient Name: Deanna [name] MRN: [id number] Date of Birth: 01/05/1931   Medicare Important Message Given:  Yes     Kerin Salen 08/02/2019, 12:00 PM

## 2019-08-02 NOTE — Plan of Care (Signed)
  Problem: Education: Goal: Knowledge of General Education information will improve Description: Including pain rating scale, medication(s)/side effects and non-pharmacologic comfort measures 08/02/2019 2052 by Nelia Shi, RN Outcome: Progressing 08/02/2019 2052 by Nelia Shi, RN Outcome: Progressing

## 2019-08-02 NOTE — Progress Notes (Signed)
PROGRESS NOTE    TAYJA MANZER  JME:[id number] DOB: 1930/11/16 DOA: 07/27/2019 PCP: Marton Redwood, MD   Brief Narrative: Deanna [name] is a 86 y.o. female with a history of type 2 diabetes, hypertension, depression, breast cancer, inflammatory diarrhea.  She presented secondary to worsening diarrhea over the last 6 weeks.  Initial work-up for infectious etiology was negative.  Concern that this is a recurrent inflammatory process.  Patient has improved during her hospital stay.  GI consulted for further evaluation.   Assessment & Plan:   Active Problems:   Colitis   Atrial fibrillation with RVR (HCC)   Acute respiratory failure with hypoxia (HCC)   Sepsis without acute organ dysfunction (HCC)   Acute colitis Initial concern for infection versus inflammatory process.  Patient with a history of inflammatory disease.  GI pathogen panel and C. difficile testing have been negative.  Patient started on Solu-Medrol IV with improvement in symptoms.  Gastroenterology was consulted for evaluation and possible intervention. -Continue Solu-medrol; discontinue Zosyn -Gastroenterology recommendations: Plan for colonoscopy today (6/24)  Sepsis Present on admission.  Secondary to acute colitis.  Physiology resolved.  Blood cultures with no growth.  Atrial fibrillation with RVR New onset.  Echo obtained on 6/22 which was significant for normal EF and atria.  Patient started on diltiazem drip in addition to heparin drip.  Cardiology consulted on 6/23. Transitioned to Cardizem CD 120 mg -Cardiology recommendations: Cardizem CD 120 mg daily, Coreg 25 mg BID, Heparin drip (will decide on long term anticoagulation)  Pulmonary edema Acute on chronic diastolic heart failure Patient was treated with IV Lasix with prompt improvement on repeat chest imaging.  CKD stage IIIa Baseline creatinine of 1.6.  Creatinine of 1.77 on presentation with improvement down to 1.04. Slight worsening secondary to  overdiuresis which is now resolving. -Hold lasix  Mass of the mid mesentery Seen on imaging.  Concern for possible neoplastic process.  Per chart review, discussed with oncology who recommends outpatient oncology work-up.  Iron deficiency anemia -Continue iron supplementation  Diabetes mellitus, type II Hemoglobin A1c of 6.3%.  Lantus started secondary to steroid initiation. -Continue Lantus and SSI  Peripheral neuropathy Patient is on Lyrica as an outpatient.  She is on on a 150 mg twice daily. -Continue Lyrica 75 mg twice daily (renally dosed)  Depression -Continue Wellbutrin and Lexapro  Essential hypertension Antihypertensives initially held secondary to sepsis.  Blood pressure is currently controlled. -Continue Coreg 25 mg twice daily -Diltiazem as mentioned above  Hypokalemia -Replete as needed  Urinary retention Patient failed voiding trial.  Foley replaced on 6/22.  Unsure of etiology.  Patient will need to follow-up with urology as an outpatient.  Leukocytosis Significant jump correlating with initiation of IV solu-medrol. No evidence of infection otherwise.   DVT prophylaxis: Heparin drip Code Status:   Code Status: DNR Family Communication: None at bedside Disposition Plan: Discharge to SNF likely in several days pending completion of work-up by cardiology and gastroenterology   Consultants:   Gastroenterology  Cardiology  Procedures:   TRANSTHORACIC ECHOCARDIOGRAM (08/01/2019) IMPRESSIONS    1. Left ventricular ejection fraction, by estimation, is 60 to 65%. The  left ventricle has normal function. The left ventricle has no regional  wall motion abnormalities. There is moderate concentric left ventricular  hypertrophy. Left ventricular  diastolic parameters are indeterminate. Elevated left ventricular  end-diastolic pressure.  2. Right ventricular systolic function is normal. The right ventricular  size is normal. There is mildly elevated  pulmonary artery systolic  pressure. The estimated right ventricular systolic pressure is 76.8 mmHg.  3. The mitral valve is normal in structure. Mild mitral valve  regurgitation. No evidence of mitral stenosis.  4. Tricuspid valve regurgitation is moderate.  5. The aortic valve is tricuspid. Aortic valve regurgitation is not  visualized. Mild to moderate aortic valve sclerosis/calcification is  present, without any evidence of aortic stenosis.  6. The inferior vena cava is normal in size with greater than 50%  respiratory variability, suggesting right atrial pressure of 3 mmHg.   Antimicrobials:  Zosyn IV   Subjective: No abdominal pain. Multiple bowel movements overnight.  Objective: Vitals:   08/01/19 2223 08/02/19 0136 08/02/19 0500 08/02/19 0511  BP: 134/89 134/76  (!) 144/95  Pulse: 67 69    Resp: 20 16  18   Temp: (!) 97.4 F (36.3 C)   98.1 F (36.7 C)  TempSrc: Oral   Axillary  SpO2: 97% 96%  98%  Weight:   47.4 kg   Height:        Intake/Output Summary (Last 24 hours) at 08/02/2019 1006 Last data filed at 08/02/2019 0519 Gross per 24 hour  Intake 768.11 ml  Output 700 ml  Net 68.11 ml   Filed Weights   07/31/19 0500 08/01/19 0452 08/02/19 0500  Weight: 47.4 kg 46.8 kg 47.4 kg    Examination:  General exam: Appears calm and comfortable Respiratory system: Clear to auscultation. Respiratory effort normal. Cardiovascular system: S1 & S2 heard, Irregular rhythm with normal rate. No murmurs, rubs, gallops or clicks. Gastrointestinal system: Abdomen is distended, soft and nontender. No organomegaly or masses felt. Normal bowel sounds heard. Central nervous system: Alert and oriented. No focal neurological deficits. Musculoskeletal: No edema. No calf tenderness Skin: No cyanosis. No rashes Psychiatry: Judgement and insight appear normal. Mood & affect appropriate.      Data Reviewed: I have personally reviewed following labs and imaging  studies  CBC Lab Results  Component Value Date   WBC 19.2 (H) 08/02/2019   RBC 4.55 08/02/2019   HGB 12.7 08/02/2019   HCT 39.2 08/02/2019   MCV 86.2 08/02/2019   MCH 27.9 08/02/2019   PLT 247 08/02/2019   MCHC 32.4 08/02/2019   RDW 14.9 08/02/2019   LYMPHSABS 3.3 07/30/2019   MONOABS 0.8 07/30/2019   EOSABS 0.0 07/30/2019   BASOSABS 0.0 11/57/2620     Last metabolic panel Lab Results  Component Value Date   NA 141 08/02/2019   K 2.9 (L) 08/02/2019   CL 109 08/02/2019   CO2 19 (L) 08/02/2019   BUN 27 (H) 08/02/2019   CREATININE 1.39 (H) 08/02/2019   GLUCOSE 166 (H) 08/02/2019   GFRNONAA 34 (L) 08/02/2019   GFRAA 39 (L) 08/02/2019   CALCIUM 9.3 08/02/2019   PHOS 2.9 08/01/2019   PROT 6.8 08/01/2019   ALBUMIN 3.3 (L) 08/01/2019   BILITOT 0.7 08/01/2019   ALKPHOS 55 08/01/2019   AST 13 (L) 08/01/2019   ALT 16 08/01/2019   ANIONGAP 13 08/02/2019    CBG (last 3)  Recent Labs    08/01/19 1618 08/01/19 2226 08/02/19 0742  GLUCAP 347* 162* 174*     GFR: Estimated Creatinine Clearance: 21.3 mL/min (A) (by C-G formula based on SCr of 1.39 mg/dL (H)).  Coagulation Profile: Recent Labs  Lab 07/27/19 1414  INR 1.2    Recent Results (from the past 240 hour(s))  Urine culture     Status: None   Collection Time: 07/27/19  1:45 AM  Specimen: Urine, Random  Result Value Ref Range Status   Specimen Description   Final    URINE, RANDOM Performed at [facility] [address] [place], [place] [address]    Special Requests   Final    NONE Performed at [facility], [place] [address] [place], [place] [address]    Culture   Final    NO GROWTH Performed at [facility], [name] [address], [place], [place] [address]    Report Status 07/29/2019 FINAL  Final  Culture, blood (Routine X 2) w Reflex to ID Panel     Status: None   Collection Time: 07/27/19  1:24 PM   Specimen: BLOOD RIGHT HAND  Result Value Ref Range  Status   Specimen Description   Final    BLOOD RIGHT HAND Performed at [facility], [place] [address] [place], [place] [address]    Special Requests   Final    BOTTLES DRAWN AEROBIC AND ANAEROBIC Blood Culture adequate volume Performed at [facility] [address], [place], [place] [address]    Culture   Final    NO GROWTH 5 DAYS Performed at [facility], [place] [address] [place], [place] [address]    Report Status 08/01/2019 FINAL  Final  Culture, blood (Routine X 2) w Reflex to ID Panel     Status: None   Collection Time: 07/27/19  1:29 PM   Specimen: BLOOD  Result Value Ref Range Status   Specimen Description   Final    BLOOD LEFT ANTECUBITAL Performed at [place] [address], [place], [place] [address]    Special Requests   Final    BOTTLES DRAWN AEROBIC AND ANAEROBIC Blood Culture results may not be optimal due to an inadequate volume of blood received in culture bottles   Culture   Final    NO GROWTH 5 DAYS Performed at [facility], [place] [address], [place],  [address]    Report Status 08/01/2019 FINAL  Final  SARS Coronavirus 2 by RT PCR (hospital order, performed in [facility] hospital lab) Nasopharyngeal Nasopharyngeal Swab     Status: None   Collection Time: 07/27/19  4:21 PM   Specimen: Nasopharyngeal Swab  Result Value Ref Range Status   SARS Coronavirus 2 NEGATIVE NEGATIVE Final    Comment: (NOTE) SARS-CoV-2 target nucleic acids are NOT DETECTED.  The SARS-CoV-2 RNA is generally detectable in upper and lower respiratory specimens during the acute phase of infection. The lowest concentration of SARS-CoV-2 viral copies this assay can detect is 250 copies / mL. A negative result does not preclude SARS-CoV-2 infection and should not be used as the sole basis for treatment or other patient management decisions.  A negative result may occur with improper specimen collection / handling,  submission of specimen other than nasopharyngeal swab, presence of viral mutation(s) within the areas targeted by this assay, and inadequate number of viral copies (<250 copies / mL). A negative result must be combined with clinical observations, patient history, and epidemiological information.  Fact Sheet for Patients:   StrictlyIdeas.no  Fact Sheet for Healthcare Providers: BankingDealers.co.za  This test is not yet approved or  cleared by the [place] FDA and has been authorized for detection and/or diagnosis of SARS-CoV-2 by FDA under an Emergency Use Authorization (EUA).  This EUA will remain in effect (meaning this test can be used) for the duration of the COVID-19  declaration under Section 564(b)(1) of the Act, 21 U.S.C. section 360bbb-3(b)(1), unless the authorization is terminated or revoked sooner.  Performed at [facility], [place] [address] [place], [place] [address]   C Difficile Quick Screen w PCR reflex     Status: None   Collection Time: 07/27/19  5:38 PM   Specimen: STOOL  Result Value Ref Range Status   C Diff antigen NEGATIVE NEGATIVE Final   C Diff toxin NEGATIVE NEGATIVE Final   C Diff interpretation No C. difficile detected.  Final    Comment: VALID Performed at [facility], [place] [address], [place], [place] [address]   Gastrointestinal Panel by PCR , Stool     Status: None   Collection Time: 07/27/19  5:38 PM   Specimen: STOOL  Result Value Ref Range Status   Campylobacter species NOT DETECTED NOT DETECTED Final   Plesimonas shigelloides NOT DETECTED NOT DETECTED Final   Salmonella species NOT DETECTED NOT DETECTED Final   Yersinia enterocolitica NOT DETECTED NOT DETECTED Final   Vibrio species NOT DETECTED NOT DETECTED Final   Vibrio cholerae NOT DETECTED NOT DETECTED Final   Enteroaggregative E coli (EAEC) NOT DETECTED NOT DETECTED Final   Enteropathogenic E  coli (EPEC) NOT DETECTED NOT DETECTED Final   Enterotoxigenic E coli (ETEC) NOT DETECTED NOT DETECTED Final   Shiga like toxin producing E coli (STEC) NOT DETECTED NOT DETECTED Final   Shigella/Enteroinvasive E coli (EIEC) NOT DETECTED NOT DETECTED Final   Cryptosporidium NOT DETECTED NOT DETECTED Final   Cyclospora cayetanensis NOT DETECTED NOT DETECTED Final   Entamoeba histolytica NOT DETECTED NOT DETECTED Final   Giardia lamblia NOT DETECTED NOT DETECTED Final   Adenovirus F40/41 NOT DETECTED NOT DETECTED Final   Astrovirus NOT DETECTED NOT DETECTED Final   Norovirus GI/GII NOT DETECTED NOT DETECTED Final   Rotavirus A NOT DETECTED NOT DETECTED Final   Sapovirus (I, II, IV, and V) NOT DETECTED NOT DETECTED Final    Comment: Performed at [facility], [address], [place],  [address]        Radiology Studies: DG CHEST PORT 1 VIEW  Result Date: 07/31/2019 CLINICAL DATA:  Shortness of breath.  History of breast cancer. EXAM: PORTABLE CHEST 1 VIEW COMPARISON:  07/31/2019 FINDINGS: Lower cervical plate and screw fixator. Left axillary clips noted along with left mastectomy. Atherosclerotic calcification of the aortic arch. The lungs appear clear. Levoconvex thoracolumbar scoliosis which may be positional. Heart size within normal limits. The patient is rotated to the right on today's radiograph, reducing diagnostic sensitivity and specificity. IMPRESSION: 1. No acute findings. 2.  Aortic Atherosclerosis (ICD10-I70.0). Electronically Signed   By: Van Clines M.D.   On: 07/31/2019 19:01   ECHOCARDIOGRAM COMPLETE  Result Date: 07/31/2019    ECHOCARDIOGRAM REPORT   Patient Name:   Deanna [name] Date of Exam: 07/31/2019 Medical Rec #:  [id number]         Height:       62.0 in Accession #:    [id number]        Weight:       104.6 lb Date of Birth:  11-Jul-1930         BSA:          1.451 m Patient Age:    54 years          BP:           128/78 mmHg Patient Gender: F  HR:           103 bpm. Exam Location:  Inpatient Procedure: 2D Echo, Cardiac Doppler and Color Doppler Indications:    Atrial fibrillation  History:        Patient has prior history of Echocardiogram examinations, most                 recent 09/29/2016. Arrythmias:Atrial Fibrillation; Risk                 Factors:Diabetes and Hypertension. Sepsis, Breast cancer.  Sonographer:    Dustin Flock Referring Phys: [phone] A CALDWELL [place]  1. Left ventricular ejection fraction, by estimation, is 60 to 65%. The left ventricle has normal function. The left ventricle has no regional wall motion abnormalities. There is moderate concentric left ventricular hypertrophy. Left ventricular diastolic parameters are indeterminate. Elevated left ventricular end-diastolic pressure.  2. Right ventricular systolic function is normal. The right ventricular size is normal. There is mildly elevated pulmonary artery systolic pressure. The estimated right ventricular systolic pressure is 61.4 mmHg.  3. The mitral valve is normal in structure. Mild mitral valve regurgitation. No evidence of mitral stenosis.  4. Tricuspid valve regurgitation is moderate.  5. The aortic valve is tricuspid. Aortic valve regurgitation is not visualized. Mild to moderate aortic valve sclerosis/calcification is present, without any evidence of aortic stenosis.  6. The inferior vena cava is normal in size with greater than 50% respiratory variability, suggesting right atrial pressure of 3 mmHg. FINDINGS  Left Ventricle: Left ventricular ejection fraction, by estimation, is 60 to 65%. The left ventricle has normal function. The left ventricle has no regional wall motion abnormalities. The left ventricular internal cavity size was normal in size. There is  moderate concentric left ventricular hypertrophy. Left ventricular diastolic parameters are indeterminate. Elevated left ventricular end-diastolic pressure. Right Ventricle: The right  ventricular size is normal. No increase in right ventricular wall thickness. Right ventricular systolic function is normal. There is mildly elevated pulmonary artery systolic pressure. The tricuspid regurgitant velocity is 3.08  m/s, and with an assumed right atrial pressure of 3 mmHg, the estimated right ventricular systolic pressure is 43.1 mmHg. Left Atrium: Left atrial size was normal in size. Right Atrium: Right atrial size was normal in size. Pericardium: There is no evidence of pericardial effusion. Mitral Valve: The mitral valve is normal in structure. There is mild calcification of the mitral valve leaflet(s). Normal mobility of the mitral valve leaflets. Mild to moderate mitral annular calcification. Mild mitral valve regurgitation. No evidence of mitral valve stenosis. Tricuspid Valve: The tricuspid valve is normal in structure. Tricuspid valve regurgitation is moderate . No evidence of tricuspid stenosis. Aortic Valve: The aortic valve is tricuspid. Aortic valve regurgitation is not visualized. Mild to moderate aortic valve sclerosis/calcification is present, without any evidence of aortic stenosis. Pulmonic Valve: The pulmonic valve was normal in structure. Pulmonic valve regurgitation is not visualized. No evidence of pulmonic stenosis. Aorta: The aortic root is normal in size and structure. Venous: The inferior vena cava is normal in size with greater than 50% respiratory variability, suggesting right atrial pressure of 3 mmHg. IAS/Shunts: No atrial level shunt detected by color flow Doppler.  LEFT VENTRICLE PLAX 2D LVIDd:         2.70 cm  Diastology LVIDs:         1.90 cm  LV e' lateral:   4.64 cm/s LV PW:         1.30 cm  LV E/e'  lateral: 18.2 LV IVS:        1.30 cm  LV e' medial:    3.77 cm/s LVOT diam:     1.70 cm  LV E/e' medial:  22.4 LV SV:         41 LV SV Index:   28 LVOT Area:     2.27 cm  RIGHT VENTRICLE RV Basal diam:  2.90 cm RV S prime:     10.60 cm/s TAPSE (M-mode): 1.9 cm LEFT ATRIUM              Index       RIGHT ATRIUM           Index LA diam:        4.10 cm 2.82 cm/m  RA Area:     12.40 cm LA Vol (A2C):   34.1 ml 23.49 ml/m RA Volume:   29.40 ml  20.26 ml/m LA Vol (A4C):   60.4 ml 41.62 ml/m LA Biplane Vol: 49.6 ml 34.17 ml/m  AORTIC VALVE LVOT Vmax:   95.30 cm/s LVOT Vmean:  71.600 cm/s LVOT VTI:    0.180 m  AORTA Ao Root diam: 2.50 cm MITRAL VALVE               TRICUSPID VALVE MV Area (PHT): 5.54 cm    TR Peak grad:   37.9 mmHg MV Decel Time: 137 msec    TR Vmax:        308.00 cm/s MV E velocity: 84.40 cm/s                            SHUNTS                            Systemic VTI:  0.18 m                            Systemic Diam: 1.70 cm Fransico Him MD Electronically signed by Fransico Him MD Signature Date/Time: 07/31/2019/1:36:53 PM    Final         Scheduled Meds: . buPROPion  150 mg Oral Daily  . carvedilol  25 mg Oral BID WC  . Chlorhexidine Gluconate Cloth  6 each Topical Daily  . diltiazem  120 mg Oral Daily  . escitalopram  10 mg Oral Daily  . ferrous sulfate  325 mg Oral Q breakfast  . insulin aspart  0-5 Units Subcutaneous QHS  . insulin aspart  0-9 Units Subcutaneous TID WC  . insulin aspart  2 Units Subcutaneous TID WC  . insulin glargine  5 Units Subcutaneous Daily  . loperamide  2 mg Oral QID  . mesalamine  2.4 g Oral Q breakfast  . methylPREDNISolone (SOLU-MEDROL) injection  40 mg Intravenous Daily  . mirabegron ER  50 mg Oral QHS  . pantoprazole  40 mg Oral Daily  . pregabalin  75 mg Oral BID  . vitamin B-12  1,000 mcg Oral Daily  . vitamin E  400 Units Oral BID   Continuous Infusions: . sodium chloride 10 mL/hr at 08/01/19 0600     LOS: 6 days     Cordelia Poche, MD [facility] 08/02/2019, 10:06 AM  If 7PM-7AM, please contact night-coverage [email or web address]

## 2019-08-02 NOTE — Interval H&P Note (Signed)
History and Physical Interval Note:  08/02/2019 12:51 PM  Deanna [name]  has presented today for surgery, with the diagnosis of diarrhea.  The various methods of treatment have been discussed with the patient and family. After consideration of risks, benefits and other options for treatment, the patient has consented to  Procedure(s): COLONOSCOPY WITH PROPOFOL (N/A) as a surgical intervention.  The patient's history has been reviewed, patient examined, no change in status, stable for surgery.  I have reviewed the patient's chart and labs.  Questions were answered to the patient's satisfaction.     [place]

## 2019-08-02 NOTE — Progress Notes (Signed)
Progress Note  Patient Name: Deanna [name] Date of Encounter: 08/02/2019  [facility] Cardiologist: [name], MD.  Subjective   Feeling tired.  Denies palpitations, chest pain, or shortness of breath.  Inpatient Medications    Scheduled Meds: . [MAR Hold] buPROPion  150 mg Oral Daily  . [MAR Hold] carvedilol  25 mg Oral BID WC  . [MAR Hold] Chlorhexidine Gluconate Cloth  6 each Topical Daily  . [MAR Hold] diltiazem  120 mg Oral Daily  . [MAR Hold] escitalopram  10 mg Oral Daily  . [MAR Hold] ferrous sulfate  325 mg Oral Q breakfast  . [MAR Hold] insulin aspart  0-5 Units Subcutaneous QHS  . [MAR Hold] insulin aspart  0-9 Units Subcutaneous TID WC  . [MAR Hold] insulin aspart  2 Units Subcutaneous TID WC  . [MAR Hold] insulin glargine  5 Units Subcutaneous Daily  . [MAR Hold] loperamide  2 mg Oral QID  . [MAR Hold] mesalamine  2.4 g Oral Q breakfast  . [MAR Hold] methylPREDNISolone (SOLU-MEDROL) injection  40 mg Intravenous Daily  . [MAR Hold] mirabegron ER  50 mg Oral QHS  . [MAR Hold] pantoprazole  40 mg Oral Daily  . [MAR Hold] potassium chloride  40 mEq Oral Q4H  . [MAR Hold] pregabalin  75 mg Oral BID  . [MAR Hold] vitamin B-12  1,000 mcg Oral Daily  . [MAR Hold] vitamin E  400 Units Oral BID   Continuous Infusions: . [MAR Hold] sodium chloride 10 mL/hr at 08/01/19 0600   PRN Meds: [MAR Hold] sodium chloride, [MAR Hold] acetaminophen **OR** [MAR Hold] acetaminophen, [MAR Hold] labetalol, [MAR Hold] metoprolol tartrate, [MAR Hold]  morphine injection, [MAR Hold] ondansetron **OR** [MAR Hold] ondansetron (ZOFRAN) IV, [MAR Hold] oxyCODONE   Vital Signs    Vitals:   08/01/19 2223 08/02/19 0136 08/02/19 0500 08/02/19 0511  BP: 134/89 134/76  (!) 144/95  Pulse: 67 69    Resp: 20 16  18   Temp: (!) 97.4 F (36.3 C)   98.1 F (36.7 C)  TempSrc: Oral   Axillary  SpO2: 97% 96%  98%  Weight:   47.4 kg   Height:        Intake/Output Summary (Last 24  hours) at 08/02/2019 1239 Last data filed at 08/02/2019 0519 Gross per 24 hour  Intake 768.11 ml  Output 700 ml  Net 68.11 ml   Last 3 Weights 08/02/2019 08/01/2019 07/31/2019  Weight (lbs) 104 lb 9.6 oz 103 lb 3.2 oz 104 lb 9.6 oz  Weight (kg) 47.446 kg 46.811 kg 47.446 kg      Telemetry    Atrial fibrillation.  Rates in the 70s- Personally Reviewed  ECG   n/a - Personally Reviewed  Physical Exam   VS:  BP (!) 144/95 (BP Location: Right Arm)   Pulse 69   Temp 98.1 F (36.7 C) (Axillary)   Resp 18   Ht 5\' 2"  (1.575 m)   Wt 47.4 kg   SpO2 98%   BMI 19.13 kg/m  , BMI Body mass index is 19.13 kg/m. GENERAL: Chronically ill-appearing frail, elderly woman.  No acute distress.  Appears tired. HEENT: Pupils equal round and reactive, fundi not visualized, oral mucosa unremarkable NECK:  No jugular venous distention, waveform within normal limits, carotid upstroke brisk and symmetric, no bruits LUNGS:  Clear to auscultation bilaterally HEART: Irregularly irregular.  PMI not displaced or sustained,S1 and S2 within normal limits, no S3, no S4, no clicks, no rubs,  no murmurs ABD:  Flat, positive bowel sounds normal in frequency in pitch, no bruits, no rebound, no guarding, no midline pulsatile mass, no hepatomegaly, no splenomegaly EXT:  2 plus pulses throughout, no edema, no cyanosis no clubbing SKIN:  No rashes no nodules NEURO:  Cranial nerves II through XII grossly intact, motor grossly intact throughout PSYCH:  Cognitively intact, oriented to person place and time   Labs    High Sensitivity Troponin:  No results for input(s): TROPONINIHS in the last 720 hours.    Chemistry Recent Labs  Lab 07/30/19 0518 07/30/19 0518 07/31/19 0746 08/01/19 0425 08/02/19 0549  NA 136   < > 141 140 141  K 3.5   < > 3.1* 3.1* 2.9*  CL 105   < > 102 103 109  CO2 20*   < > 23 25 19*  GLUCOSE 190*   < > 197* 197* 166*  BUN 21   < > 30* 41* 27*  CREATININE 1.04*   < > 1.45* 1.70* 1.39*    CALCIUM 9.7   < > 9.5 9.2 9.3  PROT 7.3  --  7.4 6.8  --   ALBUMIN 3.3*  --  3.3* 3.3*  --   AST 20  --  14* 13*  --   ALT 20  --  16 16  --   ALKPHOS 66  --  61 55  --   BILITOT 0.8  --  1.0 0.7  --   GFRNONAA 48*   < > 32* 27* 34*  GFRAA 56*   < > 37* 31* 39*  ANIONGAP 11   < > 16* 12 13   < > = values in this interval not displayed.     Hematology Recent Labs  Lab 07/31/19 0746 08/01/19 0425 08/02/19 0549  WBC 15.3* 18.9* 19.2*  RBC 4.33 4.35 4.55  HGB 12.1 12.1 12.7  HCT 36.3 36.6 39.2  MCV 83.8 84.1 86.2  MCH 27.9 27.8 27.9  MCHC 33.3 33.1 32.4  RDW 14.5 14.6 14.9  PLT 194 215 247    BNP Recent Labs  Lab 07/30/19 1604 07/31/19 0746  BNP 1,034.6* 874.8*     DDimer No results for input(s): DDIMER in the last 168 hours.   Radiology    DG CHEST PORT 1 VIEW  Result Date: 07/31/2019 CLINICAL DATA:  Shortness of breath.  History of breast cancer. EXAM: PORTABLE CHEST 1 VIEW COMPARISON:  07/31/2019 FINDINGS: Lower cervical plate and screw fixator. Left axillary clips noted along with left mastectomy. Atherosclerotic calcification of the aortic arch. The lungs appear clear. Levoconvex thoracolumbar scoliosis which may be positional. Heart size within normal limits. The patient is rotated to the right on today's radiograph, reducing diagnostic sensitivity and specificity. IMPRESSION: 1. No acute findings. 2.  Aortic Atherosclerosis (ICD10-I70.0). Electronically Signed   By: Van Clines M.D.   On: 07/31/2019 19:01    Cardiac Studies  Echo 07/31/2019: 1. Left ventricular ejection fraction, by estimation, is 60 to 65%. The  left ventricle has normal function. The left ventricle has no regional  wall motion abnormalities. There is moderate concentric left ventricular  hypertrophy. Left ventricular  diastolic parameters are indeterminate. Elevated left ventricular  end-diastolic pressure.  2. Right ventricular systolic function is normal. The right ventricular   size is normal. There is mildly elevated pulmonary artery systolic  pressure. The estimated right ventricular systolic pressure is 51.0 mmHg.  3. The mitral valve is normal in structure. Mild mitral valve  regurgitation. No evidence of mitral stenosis.  4. Tricuspid valve regurgitation is moderate.  5. The aortic valve is tricuspid. Aortic valve regurgitation is not  visualized. Mild to moderate aortic valve sclerosis/calcification is  present, without any evidence of aortic stenosis.  6. The inferior vena cava is normal in size with greater than 50%  respiratory variability, suggesting right atrial pressure of 3 mmHg.   Patient Profile     Ms. Summerson is an 67F with chronic left bundle branch block, diabetes, mild pulmonary hypertension admitted with sepsis, colitis, and UTI.   Assessment & Plan    # Paroxysmal atrial fibrillation: Newly diagnosed this admission in the setting of colitis and sepsis.  She is currently well rate-controlled on diltiazem and carvedilol.  She is off IV nodal agents.  IV heparin currently on hold for her colonoscopy today.  Had a discussion with her and her daughter at the bedside regarding long-term anticoagulation.  It sounds as though she may be undergoing a CT-guided biopsy.  Of course, we will hold any oral agents at this time.  She is unsteady on her feet and sometimes loses her balance, but has not had any falls.  We discussed the fact that overall her risk of stroke outweighs her risk of a significant fall with intracranial bleeding.  Therefore, once the need for any further interventions has passed, would strongly consider starting Eliquis.  For now continue with rate control and IV heparin.  Overall her echo revealed normal systolic function and no significant valvular disease.  Thyroid function was normal this hospitalization.  I suspect this is mostly attributable to her acute illness.  Would consider cardioversion after 3 weeks of anticoagulation.  Her  left atrial size was normal, suggesting high likelihood of being able to obtain sinus rhythm.      For questions or updates, please contact [place] Please consult [email or web address] for contact info under        Signed, [name], MD  08/02/2019, 12:39 PM

## 2019-08-02 NOTE — TOC Progression Note (Signed)
Transition of Care [facility]) - Progression Note    Patient Details  Name: Deanna [name] MRN: [id number] Date of Birth: 19-Jan-1931  Transition of Care [facility] CM/SW Contact  Macklyn Glandon, Juliann Pulse, RN Phone Number: 08/02/2019, 10:19 AM  Clinical Narrative:  Additional info sent to [facility] for pasrr.Awaiting for [employer] to eval for acceptance for SNF.     Expected Discharge Plan: Skilled Nursing Facility Barriers to Discharge: Continued Medical Work up  Expected Discharge Plan and Services Expected Discharge Plan: [place]   Discharge Planning Services: CM Consult   Living arrangements for the past 2 months: [place]                           HH Arranged: PT [facility] Agency: Other - See comment ([place] has own HHPT) Date HH Agency Contacted: 07/31/19 Time [place]: 1256 Representative spoke with at [place]: [place] ([place]) Interventions    Readmission Risk Interventions No flowsheet data found.

## 2019-08-02 NOTE — Anesthesia Preprocedure Evaluation (Addendum)
Anesthesia Evaluation  Patient identified by MRN, date of birth, ID band Patient awake    Reviewed: Allergy & Precautions, NPO status , Patient's Chart, lab work & pertinent test results  History of Anesthesia Complications Negative for: history of anesthetic complications  Airway Mallampati: II  TM Distance: >3 FB Neck ROM: Full    Dental  (+) Dental Advisory Given   Pulmonary neg pulmonary ROS,    Pulmonary exam normal        Cardiovascular hypertension, Pt. on home beta blockers and Pt. on medications Normal cardiovascular exam+ Valvular Problems/Murmurs    '21 TTE - EF 60 to 65%. Moderate concentric LVH. Mildly elevated pulmonary artery systolic pressure. The estimated right ventricular systolic pressure is 53.6 mmHg. Mild MR. Moderate TR. Mild to moderate aortic valve sclerosis/calcification is present, without any evidence of aortic stenosis.    Neuro/Psych PSYCHIATRIC DISORDERS Anxiety Depression negative neurological ROS     GI/Hepatic negative GI ROS, Neg liver ROS,   Endo/Other  diabetes, Type 2, Oral Hypoglycemic Agents  Renal/GU CRFRenal disease     Musculoskeletal  (+) Arthritis ,   Abdominal   Peds  Hematology  Leukocytosis (WBC 19.2)    Anesthesia Other Findings Covid test negative   Reproductive/Obstetrics                            Anesthesia Physical Anesthesia Plan  ASA: III  Anesthesia Plan: MAC   Post-op Pain Management:    Induction: Intravenous  PONV Risk Score and Plan: 2 and Propofol infusion and Treatment may vary due to age or medical condition  Airway Management Planned: Nasal Cannula and Natural Airway  Additional Equipment: None  Intra-op Plan:   Post-operative Plan:   Informed Consent: I have reviewed the patients History and Physical, chart, labs and discussed the procedure including the risks, benefits and alternatives for the proposed  anesthesia with the patient or authorized representative who has indicated his/her understanding and acceptance.   Patient has DNR.  Discussed DNR with patient and Suspend DNR.     Plan Discussed with: CRNA and Anesthesiologist  Anesthesia Plan Comments:        Anesthesia Quick Evaluation

## 2019-08-02 NOTE — Progress Notes (Addendum)
[place] for heparin Indication: atrial fibrillation  Allergies  Allergen Reactions  . Codeine Nausea Only    Patient Measurements: Height: 5\' 2"  (157.5 cm) Weight: 47.4 kg (104 lb 9.6 oz) IBW/kg (Calculated) : 50.1 Heparin Dosing Weight: n/a. Use TBW = 47 kg  Vital Signs: Temp: 98.1 F (36.7 C) (06/24 0511) Temp Source: Axillary (06/24 0511) BP: 144/95 (06/24 0511) Pulse Rate: 69 (06/24 0136)  Labs: Recent Labs    07/31/19 0746 07/31/19 1837 08/01/19 0425 08/01/19 0425 08/01/19 1207 08/01/19 2050 08/02/19 0549  HGB 12.1  --  12.1  --   --   --  12.7  HCT 36.3  --  36.6  --   --   --  39.2  PLT 194  --  215  --   --   --  247  HEPARINUNFRC 0.71*   < > 0.33   < > 0.20* 0.44 0.62  CREATININE 1.45*  --  1.70*  --   --   --  1.39*   < > = values in this interval not displayed.    Estimated Creatinine Clearance: 21.3 mL/min (A) (by C-G formula based on SCr of 1.39 mg/dL (H)).   Assessment: 86 y.o.femalewith medical history significant ofT2DM, HTN, depression, breast cancer, inflammatory diarrhea who presents with worsening symptoms of diarrhea.  Patient developed atrial fibrillation with RVR, denies any chest pain but notes some SOB.  Pharmacy consulted to dose heparin.  No prior AC noted.  Today, 08/02/19  CBC: Hgb & Plt both stable, WNL  HL = 0.62 therapeutic on heparin infusion at 700 units/hr  No bleeding reported  Scr 1.7> 1.39  Heparin off at 07 am for 11 am colonoscopy   Goal of Therapy:  Heparin level 0.3-0.7 units/ml Monitor platelets by anticoagulation protocol:   Plan:   Heparin drip stopped at 07 am for 11 am colonoscopy  F/u plans to resume heparin post procedure  Daily HL, CBC while on heparin  Long-term anticoagulation to be determined after all invasive procedures completed  Eudelia Bunch, Pharm.D 08/02/2019 7:18 AM  Addendum: S/p colonoscopy. D/w Dr Havery Moros  Plan: resume heparin  with no bolus 6 hrs after end of colonoscopy Will resume at prior rate of 700 units/hr at 8 pm tonight and check heparin level & CBC w/ am labs  Eudelia Bunch, Pharm.D 08/02/2019 2:37 PM

## 2019-08-02 NOTE — Anesthesia Postprocedure Evaluation (Signed)
Anesthesia Post Note  Patient: Deanna [name]  Procedure(s) Performed: COLONOSCOPY WITH PROPOFOL (N/A ) BIOPSY     Patient location during evaluation: PACU Anesthesia Type: MAC Level of consciousness: awake and alert Pain management: pain level controlled Vital Signs Assessment: post-procedure vital signs reviewed and stable Respiratory status: spontaneous breathing, nonlabored ventilation and respiratory function stable Cardiovascular status: stable and blood pressure returned to baseline Anesthetic complications: no   No complications documented.  Last Vitals:  Vitals:   08/02/19 1410 08/02/19 1429  BP: 106/61 (!) 145/80  Pulse: 64 73  Resp: 17 20  Temp:  36.4 C  SpO2: 96% 97%    Last Pain:  Vitals:   08/02/19 1429  TempSrc: Oral  PainSc:                  [name]

## 2019-08-02 NOTE — Transfer of Care (Signed)
Immediate Anesthesia Transfer of Care Note  Patient: Deanna [name]  Procedure(s) Performed: COLONOSCOPY WITH PROPOFOL (N/A )  Patient Location: Endoscopy Unit  Anesthesia Type:MAC  Level of Consciousness: awake, drowsy, patient cooperative and responds to stimulation  Airway & Oxygen Therapy: Patient Spontanous Breathing and Patient connected to face mask oxygen  Post-op Assessment: Report given to RN and Post -op Vital signs reviewed and stable  Post vital signs: Reviewed and stable  Last Vitals:  Vitals Value Taken Time  BP 124/63 08/02/19 1351  Temp    Pulse 73 08/02/19 1356  Resp 14 08/02/19 1356  SpO2 100 % 08/02/19 1356  Vitals shown include unvalidated device data.  Last Pain:  Vitals:   08/02/19 1350  TempSrc: (P) Axillary  PainSc:       Patients Stated Pain Goal: 2 (49/82/64 1583)  Complications: No complications documented.

## 2019-08-02 NOTE — Interval H&P Note (Signed)
History and Physical Interval Note:  08/02/2019 12:51 PM  Deanna [name]  has presented today for surgery, with the diagnosis of diarrhea.  The various methods of treatment have been discussed with the patient and family. After consideration of risks, benefits and other options for treatment, the patient has consented to  Procedure(s): COLONOSCOPY WITH PROPOFOL (N/A) as a surgical intervention.  The patient's history has been reviewed, patient examined, no change in status, stable for surgery.  I have reviewed the patient's chart and labs.  Questions were answered to the patient's satisfaction.     [place]

## 2019-08-03 ENCOUNTER — Encounter (HOSPITAL_COMMUNITY): Payer: Self-pay | Admitting: Gastroenterology

## 2019-08-03 DIAGNOSIS — R197 Diarrhea, unspecified: Secondary | ICD-10-CM

## 2019-08-03 LAB — BASIC METABOLIC PANEL
Anion gap: 9 (ref 5–15)
BUN: 30 mg/dL — ABNORMAL HIGH (ref 8–23)
CO2: 19 mmol/L — ABNORMAL LOW (ref 22–32)
Calcium: 9.4 mg/dL (ref 8.9–10.3)
Chloride: 111 mmol/L (ref 98–111)
Creatinine, Ser: 1.21 mg/dL — ABNORMAL HIGH (ref 0.44–1.00)
GFR calc Af Amer: 47 mL/min — ABNORMAL LOW (ref >60)
GFR calc non Af Amer: 40 mL/min — ABNORMAL LOW (ref >60)
Glucose, Bld: 153 mg/dL — ABNORMAL HIGH (ref 70–99)
Potassium: 4.3 mmol/L (ref 3.5–5.1)
Sodium: 139 mmol/L (ref 135–145)

## 2019-08-03 LAB — HEPARIN LEVEL (UNFRACTIONATED)
Heparin Unfractionated: 1.46 IU/mL — ABNORMAL HIGH (ref 0.30–0.70)
Heparin Unfractionated: 0.53 IU/mL (ref 0.30–0.70)

## 2019-08-03 LAB — CBC
HCT: 34.8 % — ABNORMAL LOW (ref 36.0–46.0)
Hemoglobin: 11.6 g/dL — ABNORMAL LOW (ref 12.0–15.0)
MCH: 28.7 pg (ref 26.0–34.0)
MCHC: 33.3 g/dL (ref 30.0–36.0)
MCV: 86.1 fL (ref 80.0–100.0)
Platelets: 240 10*3/uL (ref 150–400)
RBC: 4.04 MIL/uL (ref 3.87–5.11)
RDW: 15.1 % (ref 11.5–15.5)
WBC: 16 10*3/uL — ABNORMAL HIGH (ref 4.0–10.5)
nRBC: 0 % (ref 0.0–0.2)

## 2019-08-03 LAB — GLUCOSE, CAPILLARY
Glucose-Capillary: 112 mg/dL — ABNORMAL HIGH (ref 70–99)
Glucose-Capillary: 209 mg/dL — ABNORMAL HIGH (ref 70–99)
Glucose-Capillary: 282 mg/dL — ABNORMAL HIGH (ref 70–99)
Glucose-Capillary: 244 mg/dL — ABNORMAL HIGH (ref 70–99)

## 2019-08-03 MED ORDER — PREDNISONE 20 MG PO TABS
40.0000 mg | ORAL_TABLET | Freq: Every day | ORAL | Status: DC
  Administered 2019-08-03 – 2019-08-06 (×4): 40 mg via ORAL
  Filled 2019-08-03 (×4): qty 2

## 2019-08-03 MED ORDER — APIXABAN 2.5 MG PO TABS
2.5000 mg | ORAL_TABLET | Freq: Two times a day (BID) | ORAL | Status: DC
  Administered 2019-08-03 – 2019-08-06 (×7): 2.5 mg via ORAL
  Filled 2019-08-03 (×7): qty 1

## 2019-08-03 MED ORDER — DILTIAZEM HCL ER COATED BEADS 180 MG PO CP24
180.0000 mg | ORAL_CAPSULE | Freq: Every day | ORAL | Status: DC
  Administered 2019-08-04 – 2019-08-06 (×3): 180 mg via ORAL
  Filled 2019-08-03 (×3): qty 1

## 2019-08-03 MED ORDER — DILTIAZEM HCL 30 MG PO TABS
30.0000 mg | ORAL_TABLET | Freq: Two times a day (BID) | ORAL | Status: AC
  Administered 2019-08-03 (×2): 30 mg via ORAL
  Filled 2019-08-03 (×2): qty 1

## 2019-08-03 NOTE — Progress Notes (Signed)
Report received from K.Gurley, RN.  Assessment unchanged. Elani Delph B  

## 2019-08-03 NOTE — Progress Notes (Signed)
PROGRESS NOTE    Deanna [name]  WEX:[id number] DOB: 05-Jul-1930 DOA: 07/27/2019 PCP: Marton Redwood, MD   Brief Narrative: Deanna [name] is a 86 y.o. female with a history of type 2 diabetes, hypertension, depression, breast cancer, inflammatory diarrhea.  She presented secondary to worsening diarrhea over the last 6 weeks.  Initial work-up for infectious etiology was negative.  Concern that this is a recurrent inflammatory process.  Patient has improved during her hospital stay.  GI consulted for further evaluation.   Assessment & Plan:   Active Problems:   Colitis   Atrial fibrillation with RVR (HCC)   Acute respiratory failure with hypoxia (HCC)   Sepsis without acute organ dysfunction (HCC)   Diarrhea   Acute colitis Initial concern for infection versus inflammatory process.  Patient with a history of inflammatory disease.  GI pathogen panel and C. difficile testing have been negative.  Patient started on Solu-Medrol IV with improvement in symptoms.  Gastroenterology was consulted and performed colonoscopy on 6/24 which was concerning for Crohn's disease; biopsies obtained and are pending. -Gastroenterology recommendations: Prednisone 40 mg daily x1 week, then taper down 5 mg per week until completed. Signed off. Patient will need follow-up with her primary gastroenterologist, Dr. Earlean Shawl.  Sepsis Present on admission.  Secondary to acute colitis.  Physiology resolved.  Blood cultures with no growth.  Atrial fibrillation with RVR New onset.  Echo obtained on 6/22 which was significant for normal EF and atria.  Patient started on diltiazem drip in addition to heparin drip.  Cardiology consulted on 6/23. Transitioned to Cardizem PO -Cardiology recommendations: Cardizem CD 180 mg daily, Coreg 25 mg BID, started on Eliquis 2.5 mg BID dosing  Pulmonary edema Acute on chronic diastolic heart failure Patient was treated with IV Lasix with prompt improvement on repeat chest  imaging.  CKD stage IIIa Baseline creatinine of 1.6.  Creatinine of 1.77 on presentation with improvement down to 1.04. Slight worsening secondary to overdiuresis which is now resolving. -Hold lasix  Mass of the mid mesentery Seen on imaging.  Concern for possible neoplastic process.  Per chart review, discussed with oncology who recommends outpatient oncology work-up. Discussed with interventional radiology for consideration of biopsy who recommend DOTATATE PET as an outpatient.  Iron deficiency anemia -Continue iron supplementation  Diabetes mellitus, type II Hemoglobin A1c of 6.3%.  Lantus started secondary to steroid initiation. -Continue Lantus and SSI  Peripheral neuropathy Patient is on Lyrica as an outpatient.  She is on on a 150 mg twice daily. -Continue Lyrica 75 mg twice daily (renally dosed)  Depression -Continue Wellbutrin and Lexapro  Essential hypertension Antihypertensives initially held secondary to sepsis.  Blood pressure is currently controlled. -Continue Coreg 25 mg twice daily -Diltiazem as mentioned above  Hypokalemia -Replete as needed  Urinary retention Patient failed voiding trial.  Foley replaced on 6/22.  Unsure of etiology.  Patient will need to follow-up with urology as an outpatient.  Leukocytosis Significant jump correlating with initiation of IV solu-medrol. No evidence of infection otherwise. Trending down.   DVT prophylaxis: Heparin drip Code Status:   Code Status: DNR Family Communication: None at bedside Disposition Plan: Discharge to SNF in three days pending cardiology titration of diltiazem in addition to SNF having ability to admit patient (unable over the weekend)   Consultants:   Gastroenterology  Cardiology  Procedures:   TRANSTHORACIC ECHOCARDIOGRAM (08/01/2019) IMPRESSIONS    1. Left ventricular ejection fraction, by estimation, is 60 to 65%. The  left ventricle has normal  function. The left ventricle has no  regional  wall motion abnormalities. There is moderate concentric left ventricular  hypertrophy. Left ventricular  diastolic parameters are indeterminate. Elevated left ventricular  end-diastolic pressure.  2. Right ventricular systolic function is normal. The right ventricular  size is normal. There is mildly elevated pulmonary artery systolic  pressure. The estimated right ventricular systolic pressure is 84.6 mmHg.  3. The mitral valve is normal in structure. Mild mitral valve  regurgitation. No evidence of mitral stenosis.  4. Tricuspid valve regurgitation is moderate.  5. The aortic valve is tricuspid. Aortic valve regurgitation is not  visualized. Mild to moderate aortic valve sclerosis/calcification is  present, without any evidence of aortic stenosis.  6. The inferior vena cava is normal in size with greater than 50%  respiratory variability, suggesting right atrial pressure of 3 mmHg.   Antimicrobials:  Zosyn IV   Subjective: Diarrhea is significantly improved. No concerns overnight. No issues today.  Objective: Vitals:   08/03/19 0633 08/03/19 0737 08/03/19 0747 08/03/19 1315  BP: (!) 139/92   108/75  Pulse: 88   80  Resp: 16   20  Temp: 98.3 F (36.8 C)   98.1 F (36.7 C)  TempSrc: Oral   Oral  SpO2: 99%   97%  Weight:  45.8 kg 46 kg   Height:        Intake/Output Summary (Last 24 hours) at 08/03/2019 1542 Last data filed at 08/03/2019 1200 Gross per 24 hour  Intake 451.6 ml  Output 500 ml  Net -48.4 ml   Filed Weights   08/02/19 1248 08/03/19 0737 08/03/19 0747  Weight: 47.4 kg 45.8 kg 46 kg    Examination:  General exam: Appears calm and comfortable Respiratory system: Clear to auscultation. Respiratory effort normal. Cardiovascular system: S1 & S2 heard, irregular rhythm with normal rate. No murmurs, rubs, gallops or clicks. Gastrointestinal system: Abdomen is slightly distended, soft and nontender. No organomegaly or masses felt. Normal bowel  sounds heard. Central nervous system: Alert and oriented. No focal neurological deficits. Musculoskeletal: No calf tenderness Skin: No cyanosis. No rashes Psychiatry: Judgement and insight appear normal. Mood & affect appropriate.      Data Reviewed: I have personally reviewed following labs and imaging studies  CBC Lab Results  Component Value Date   WBC 16.0 (H) 08/03/2019   RBC 4.04 08/03/2019   HGB 11.6 (L) 08/03/2019   HCT 34.8 (L) 08/03/2019   MCV 86.1 08/03/2019   MCH 28.7 08/03/2019   PLT 240 08/03/2019   MCHC 33.3 08/03/2019   RDW 15.1 08/03/2019   LYMPHSABS 3.3 07/30/2019   MONOABS 0.8 07/30/2019   EOSABS 0.0 07/30/2019   BASOSABS 0.0 96/29/5284     Last metabolic panel Lab Results  Component Value Date   NA 139 08/03/2019   K 4.3 08/03/2019   CL 111 08/03/2019   CO2 19 (L) 08/03/2019   BUN 30 (H) 08/03/2019   CREATININE 1.21 (H) 08/03/2019   GLUCOSE 153 (H) 08/03/2019   GFRNONAA 40 (L) 08/03/2019   GFRAA 47 (L) 08/03/2019   CALCIUM 9.4 08/03/2019   PHOS 2.9 08/01/2019   PROT 6.8 08/01/2019   ALBUMIN 3.3 (L) 08/01/2019   BILITOT 0.7 08/01/2019   ALKPHOS 55 08/01/2019   AST 13 (L) 08/01/2019   ALT 16 08/01/2019   ANIONGAP 9 08/03/2019    CBG (last 3)  Recent Labs    08/02/19 2044 08/03/19 0733 08/03/19 1144  GLUCAP 280* 112* 209*  GFR: Estimated Creatinine Clearance: 23.8 mL/min (A) (by C-G formula based on SCr of 1.21 mg/dL (H)).  Coagulation Profile: No results for input(s): INR, PROTIME in the last 168 hours.  Recent Results (from the past 240 hour(s))  Urine culture     Status: None   Collection Time: 07/27/19  1:45 AM   Specimen: Urine, Random  Result Value Ref Range Status   Specimen Description   Final    URINE, RANDOM Performed at [facility] [address], [place], [place] [address]    Special Requests   Final    NONE Performed at [facility], [place] [address], [place],  [place] [address]    Culture   Final    NO GROWTH Performed at [facility], [place] [address] [place], [place] [address]    Report Status 07/29/2019 FINAL  Final  Culture, blood (Routine X 2) w Reflex to ID Panel     Status: None   Collection Time: 07/27/19  1:24 PM   Specimen: BLOOD RIGHT HAND  Result Value Ref Range Status   Specimen Description   Final    BLOOD RIGHT HAND Performed at [facility], [place] [address], [place], [place] [address]    Special Requests   Final    BOTTLES DRAWN AEROBIC AND ANAEROBIC Blood Culture adequate volume Performed at [facility] [address], [place], [place] [address]    Culture   Final    NO GROWTH 5 DAYS Performed at [facility], [place] [address], [place], [place] [address]    Report Status 08/01/2019 FINAL  Final  Culture, blood (Routine X 2) w Reflex to ID Panel     Status: None   Collection Time: 07/27/19  1:29 PM   Specimen: BLOOD  Result Value Ref Range Status   Specimen Description   Final    BLOOD LEFT ANTECUBITAL Performed at [facility] [address], [place], [place] [address]    Special Requests   Final    BOTTLES DRAWN AEROBIC AND ANAEROBIC Blood Culture results may not be optimal due to an inadequate volume of blood received in culture bottles   Culture   Final    NO GROWTH 5 DAYS Performed at [facility], [place] [address], [place],  [address]    Report Status 08/01/2019 FINAL  Final  SARS Coronavirus 2 by RT PCR (hospital order, performed in [facility] hospital lab) Nasopharyngeal Nasopharyngeal Swab     Status: None   Collection Time: 07/27/19  4:21 PM   Specimen: Nasopharyngeal Swab  Result Value Ref Range Status   SARS Coronavirus 2 NEGATIVE NEGATIVE Final    Comment: (NOTE) SARS-CoV-2 target nucleic acids are NOT DETECTED.  The SARS-CoV-2 RNA is generally detectable in upper and lower respiratory specimens during the acute phase of infection. The  lowest concentration of SARS-CoV-2 viral copies this assay can detect is 250 copies / mL. A negative result does not preclude SARS-CoV-2 infection and should not be used as the sole basis for treatment or other patient management decisions.  A negative result may occur with improper specimen collection / handling, submission of specimen other than nasopharyngeal swab, presence of viral mutation(s) within the areas targeted by this assay, and inadequate number of viral copies (<250 copies / mL). A negative result must be combined with clinical observations, patient history, and epidemiological information.  Fact Sheet for Patients:   StrictlyIdeas.no  Fact Sheet for Healthcare Providers: BankingDealers.co.za  This test is not yet approved or  cleared by the [place] FDA and has been authorized for detection and/or diagnosis of SARS-CoV-2 by FDA under an Emergency Use Authorization (EUA).  This EUA will remain in effect (meaning this test can be used) for the duration of the COVID-19 declaration under Section 564(b)(1) of the Act, 21 U.S.C. section 360bbb-3(b)(1), unless the authorization is terminated or revoked sooner.  Performed at [facility], [place] [address] [place], [place] [address]   C Difficile Quick Screen w PCR reflex     Status: None   Collection Time: 07/27/19  5:38 PM   Specimen: STOOL  Result Value Ref Range Status   C Diff antigen NEGATIVE NEGATIVE Final   C Diff toxin NEGATIVE NEGATIVE Final   C Diff interpretation No C. difficile detected.  Final    Comment: VALID Performed at [facility], [place] [address], [place], [place] [address]   Gastrointestinal Panel by PCR , Stool     Status: None   Collection Time: 07/27/19  5:38 PM   Specimen: STOOL  Result Value Ref Range Status   Campylobacter species NOT DETECTED NOT DETECTED Final   Plesimonas shigelloides NOT DETECTED NOT  DETECTED Final   Salmonella species NOT DETECTED NOT DETECTED Final   Yersinia enterocolitica NOT DETECTED NOT DETECTED Final   Vibrio species NOT DETECTED NOT DETECTED Final   Vibrio cholerae NOT DETECTED NOT DETECTED Final   Enteroaggregative E coli (EAEC) NOT DETECTED NOT DETECTED Final   Enteropathogenic E coli (EPEC) NOT DETECTED NOT DETECTED Final   Enterotoxigenic E coli (ETEC) NOT DETECTED NOT DETECTED Final   Shiga like toxin producing E coli (STEC) NOT DETECTED NOT DETECTED Final   Shigella/Enteroinvasive E coli (EIEC) NOT DETECTED NOT DETECTED Final   Cryptosporidium NOT DETECTED NOT DETECTED Final   Cyclospora cayetanensis NOT DETECTED NOT DETECTED Final   Entamoeba histolytica NOT DETECTED NOT DETECTED Final   Giardia lamblia NOT DETECTED NOT DETECTED Final   Adenovirus F40/41 NOT DETECTED NOT DETECTED Final   Astrovirus NOT DETECTED NOT DETECTED Final   Norovirus GI/GII NOT DETECTED NOT DETECTED Final   Rotavirus A NOT DETECTED NOT DETECTED Final   Sapovirus (I, II, IV, and V) NOT DETECTED NOT DETECTED Final    Comment: Performed at [facility], [address] [place],  [address]        Radiology Studies: No results found.      Scheduled Meds: . apixaban  2.5 mg Oral BID  . buPROPion  150 mg Oral Daily  . carvedilol  25 mg Oral BID WC  . Chlorhexidine Gluconate Cloth  6 each Topical Daily  . diltiazem  120 mg Oral Daily  . [START ON 08/04/2019] diltiazem  180 mg Oral Daily  . diltiazem  30 mg Oral Q12H  . escitalopram  10 mg Oral Daily  . ferrous sulfate  325 mg Oral Q breakfast  . insulin aspart  0-5 Units Subcutaneous QHS  . insulin aspart  0-9 Units Subcutaneous TID WC  . insulin aspart  2 Units Subcutaneous TID WC  . insulin glargine  5 Units Subcutaneous Daily  . loperamide  2 mg Oral QID  . mirabegron ER  50 mg Oral QHS  . pantoprazole  40 mg Oral Daily  . predniSONE  40 mg Oral Daily  . pregabalin  75 mg Oral BID  . vitamin  B-12  1,000 mcg Oral Daily  .  vitamin E  400 Units Oral BID   Continuous Infusions: . sodium chloride 10 mL/hr at 08/01/19 0600     LOS: 7 days     Cordelia Poche, MD [facility] 08/03/2019, 3:42 PM  If 7PM-7AM, please contact night-coverage [email or web address]

## 2019-08-03 NOTE — Progress Notes (Signed)
Progress Note  Patient Name: Deanna [name] Date of Encounter: 08/03/2019  [facility] Cardiologist: [name], MD.  Subjective   Feeling well.  No chest pain or palpitations.    Inpatient Medications    Scheduled Meds: . buPROPion  150 mg Oral Daily  . carvedilol  25 mg Oral BID WC  . Chlorhexidine Gluconate Cloth  6 each Topical Daily  . diltiazem  120 mg Oral Daily  . escitalopram  10 mg Oral Daily  . ferrous sulfate  325 mg Oral Q breakfast  . insulin aspart  0-5 Units Subcutaneous QHS  . insulin aspart  0-9 Units Subcutaneous TID WC  . insulin aspart  2 Units Subcutaneous TID WC  . insulin glargine  5 Units Subcutaneous Daily  . loperamide  2 mg Oral QID  . mirabegron ER  50 mg Oral QHS  . pantoprazole  40 mg Oral Daily  . predniSONE  40 mg Oral Daily  . pregabalin  75 mg Oral BID  . vitamin B-12  1,000 mcg Oral Daily  . vitamin E  400 Units Oral BID   Continuous Infusions: . sodium chloride 10 mL/hr at 08/01/19 0600  . heparin 700 Units/hr (08/02/19 2007)   PRN Meds: sodium chloride, acetaminophen **OR** acetaminophen, labetalol, metoprolol tartrate, morphine injection, ondansetron **OR** ondansetron (ZOFRAN) IV, oxyCODONE   Vital Signs    Vitals:   08/02/19 2047 08/03/19 0633 08/03/19 0737 08/03/19 0747  BP: 110/68 (!) 139/92    Pulse: 76 88    Resp: 19 16    Temp: 97.7 F (36.5 C) 98.3 F (36.8 C)    TempSrc: Oral Oral    SpO2: 97% 99%    Weight:   45.8 kg 46 kg  Height:        Intake/Output Summary (Last 24 hours) at 08/03/2019 0957 Last data filed at 08/03/2019 0900 Gross per 24 hour  Intake 488.21 ml  Output --  Net 488.21 ml   Last 3 Weights 08/03/2019 08/03/2019 08/02/2019  Weight (lbs) 101 lb 6.4 oz 101 lb 104 lb 9.6 oz  Weight (kg) 45.995 kg 45.813 kg 47.446 kg      Telemetry    Atrial fibrillation.  Rates in the 70s- Personally Reviewed  ECG   n/a - Personally Reviewed  Physical Exam   VS:  BP (!) 139/92 (BP  Location: Right Arm)   Pulse 88   Temp 98.3 F (36.8 C) (Oral)   Resp 16   Ht 5\' 2"  (1.575 m)   Wt 46 kg   SpO2 99%   BMI 18.55 kg/m  , BMI Body mass index is 18.55 kg/m. GENERAL: Chronically ill-appearing frail, elderly woman.  No acute distress.  Appears tired. HEENT: Pupils equal round and reactive, fundi not visualized, oral mucosa unremarkable NECK:  No jugular venous distention, waveform within normal limits, carotid upstroke brisk and symmetric, no bruits LUNGS:  Clear to auscultation bilaterally HEART: Irregularly irregular.  Tachycardic.  PMI not displaced or sustained,S1 and S2 within normal limits, no S3, no S4, no clicks, no rubs, no murmurs ABD:  Flat, positive bowel sounds normal in frequency in pitch, no bruits, no rebound, no guarding, no midline pulsatile mass, no hepatomegaly, no splenomegaly EXT:  2 plus pulses throughout, no edema, no cyanosis no clubbing SKIN:  No rashes no nodules NEURO:  Cranial nerves II through XII grossly intact, motor grossly intact throughout PSYCH:  Cognitively intact, oriented to person place and time   Labs  High Sensitivity Troponin:  No results for input(s): TROPONINIHS in the last 720 hours.    Chemistry Recent Labs  Lab 07/30/19 0518 07/30/19 0518 07/31/19 0746 07/31/19 0746 08/01/19 0425 08/02/19 0549 08/03/19 0530  NA 136   < > 141   < > 140 141 139  K 3.5   < > 3.1*   < > 3.1* 2.9* 4.3  CL 105   < > 102   < > 103 109 111  CO2 20*   < > 23   < > 25 19* 19*  GLUCOSE 190*   < > 197*   < > 197* 166* 153*  BUN 21   < > 30*   < > 41* 27* 30*  CREATININE 1.04*   < > 1.45*   < > 1.70* 1.39* 1.21*  CALCIUM 9.7   < > 9.5   < > 9.2 9.3 9.4  PROT 7.3  --  7.4  --  6.8  --   --   ALBUMIN 3.3*  --  3.3*  --  3.3*  --   --   AST 20  --  14*  --  13*  --   --   ALT 20  --  16  --  16  --   --   ALKPHOS 66  --  61  --  55  --   --   BILITOT 0.8  --  1.0  --  0.7  --   --   GFRNONAA 48*   < > 32*   < > 27* 34* 40*  GFRAA 56*    < > 37*   < > 31* 39* 47*  ANIONGAP 11   < > 16*   < > 12 13 9    < > = values in this interval not displayed.     Hematology Recent Labs  Lab 08/01/19 0425 08/02/19 0549 08/03/19 0530  WBC 18.9* 19.2* 16.0*  RBC 4.35 4.55 4.04  HGB 12.1 12.7 11.6*  HCT 36.6 39.2 34.8*  MCV 84.1 86.2 86.1  MCH 27.8 27.9 28.7  MCHC 33.1 32.4 33.3  RDW 14.6 14.9 15.1  PLT 215 247 240    BNP Recent Labs  Lab 07/30/19 1604 07/31/19 0746  BNP 1,034.6* 874.8*     DDimer No results for input(s): DDIMER in the last 168 hours.   Radiology    No results found.  Cardiac Studies  Echo 07/31/2019: 1. Left ventricular ejection fraction, by estimation, is 60 to 65%. The  left ventricle has normal function. The left ventricle has no regional  wall motion abnormalities. There is moderate concentric left ventricular  hypertrophy. Left ventricular  diastolic parameters are indeterminate. Elevated left ventricular  end-diastolic pressure.  2. Right ventricular systolic function is normal. The right ventricular  size is normal. There is mildly elevated pulmonary artery systolic  pressure. The estimated right ventricular systolic pressure is 97.0 mmHg.  3. The mitral valve is normal in structure. Mild mitral valve  regurgitation. No evidence of mitral stenosis.  4. Tricuspid valve regurgitation is moderate.  5. The aortic valve is tricuspid. Aortic valve regurgitation is not  visualized. Mild to moderate aortic valve sclerosis/calcification is  present, without any evidence of aortic stenosis.  6. The inferior vena cava is normal in size with greater than 50%  respiratory variability, suggesting right atrial pressure of 3 mmHg.   Patient Profile     Ms. Azpeitia is an 20F with chronic left bundle branch  block, diabetes, mild pulmonary hypertension admitted with sepsis, colitis, and UTI.   Assessment & Plan    # Paroxysmal atrial fibrillation: Newly diagnosed this admission in the setting of  colitis and sepsis.  Rate was better controlled yesterday but she is tachycardic today.  We will increased diltiazem to 180mg  daily.  She received 120mg  already.  We will give an additional 30mg  bid today and increase her daily dose tomorrow.  She underwent cololonoscopy and was found to have ulcers consistent with Crohn's disease.  I have spoken with her gastroenterologist who feels it is okay to start her on Eliquis.  We will stop heparin and start Eliquis today.  She is going to be seeing oncology as an outpatient.  She may need CT-guided biopsy.  It would be okay to hold Eliquis for 3 days prior to this.  Down the line, we can consider cardioversion when she can be on 3 weeks of anticoagulation without interruption.  She would need to be able to be on anticoagulation for at least 1 month following cardioversion.  Continue rate control for now, as she is asymptomatic.  Given that her left atrium is small it is quite possible that she may be able to maintain sinus rhythm and that this was due to her acute illness.      For questions or updates, please contact [place] Please consult [email or web address] for contact info under        Signed, [name], MD  08/03/2019, 9:57 AM

## 2019-08-03 NOTE — TOC Progression Note (Signed)
Transition of Care [facility]) - Progression Note    Patient Details  Name: Deanna [name] MRN: [id number] Date of Birth: 12/31/1930  [facility] CM/SW Contact  Tamieka Rancourt, Juliann Pulse, RN Phone Number: 08/03/2019, 1:47 PM  Clinical Narrative:Per [place] SNF rep Mia unable to accept back over weekend d/t staffing.Will accept patient if stable Monday.Will start auth.       Expected Discharge Plan: Skilled Nursing Facility Barriers to Discharge: Continued Medical Work up  Expected Discharge Plan and Services Expected Discharge Plan: [place]   Discharge Planning Services: CM Consult   Living arrangements for the past 2 months: [place]                           HH Arranged: PT [facility] Agency: Other - See comment ([place] has own HHPT) Date HH Agency Contacted: 07/31/19 Time [place]: 1256 Representative spoke with at [place]: [place] ([place]) Interventions    Readmission Risk Interventions No flowsheet data found.

## 2019-08-03 NOTE — Discharge Instructions (Addendum)
Deanna [name],  You were in the hospital because of diarrhea secondary to inflammatory disease. The biopsy was not conclusive. You will be discharged on a steroid taper. You also developed atrial fibrillation and will be on medications, diltiazem and Eliquis. You will need to follow-up with the cardiologist. You were found to have a mass in your abdomen. I discussed this with Dr. Marin Olp who will follow-up with you in the office.    Information on my medicine - ELIQUIS (apixaban)   Why was Eliquis prescribed for you? Eliquis was prescribed for you to reduce the risk of a blood clot forming that can cause a stroke if you have a medical condition called atrial fibrillation (a type of irregular heartbeat).  What do You need to know about Eliquis ? Take your Eliquis TWICE DAILY - one tablet in the morning and one tablet in the evening with or without food. If you have difficulty swallowing the tablet whole please discuss with your pharmacist how to take the medication safely.  Take Eliquis exactly as prescribed by your doctor and DO NOT stop taking Eliquis without talking to the doctor who prescribed the medication.  Stopping may increase your risk of developing a stroke.  Refill your prescription before you run out.  After discharge, you should have regular check-up appointments with your healthcare provider that is prescribing your Eliquis.  In the future your dose may need to be changed if your kidney function or weight changes by a significant amount or as you get older.  What do you do if you miss a dose? If you miss a dose, take it as soon as you remember on the same day and resume taking twice daily.  Do not take more than one dose of ELIQUIS at the same time to make up a missed dose.  Important Safety Information A possible side effect of Eliquis is bleeding. You should call your healthcare provider right away if you experience any of the following: ? Bleeding from an injury  or your nose that does not stop. ? Unusual colored urine (red or dark brown) or unusual colored stools (red or black). ? Unusual bruising for unknown reasons. ? A serious fall or if you hit your head (even if there is no bleeding).  Some medicines may interact with Eliquis and might increase your risk of bleeding or clotting while on Eliquis. To help avoid this, consult your healthcare provider or pharmacist prior to using any new prescription or non-prescription medications, including herbals, vitamins, non-steroidal anti-inflammatory drugs (NSAIDs) and supplements.  This website has more information on Eliquis (apixaban): http://www.eliquis.com/eliquis/home

## 2019-08-03 NOTE — Progress Notes (Addendum)
[facility] for heparin Indication: atrial fibrillation  Allergies  Allergen Reactions  . Codeine Nausea Only    Patient Measurements: Height: 5\' 2"  (157.5 cm) Weight: 46 kg (101 lb 6.4 oz) IBW/kg (Calculated) : 50.1 Heparin Dosing Weight: n/a. Use TBW = 47 kg  Vital Signs: Temp: 98.3 F (36.8 C) (06/25 0633) Temp Source: Oral (06/25 7116) BP: 139/92 (06/25 5790) Pulse Rate: 88 (06/25 0633)  Labs: Recent Labs    08/01/19 0425 08/01/19 1207 08/02/19 0549 08/03/19 0530 08/03/19 0733  HGB 12.1  --  12.7 11.6*  --   HCT 36.6  --  39.2 34.8*  --   PLT 215  --  247 240  --   HEPARINUNFRC 0.33   < > 0.62 1.46* 0.53  CREATININE 1.70*  --  1.39* 1.21*  --    < > = values in this interval not displayed.    Estimated Creatinine Clearance: 23.8 mL/min (A) (by C-G formula based on SCr of 1.21 mg/dL (H)).   Assessment: 86 y.o.femalewith medical history significant ofT2DM, HTN, depression, breast cancer, inflammatory diarrhea who presents with worsening symptoms of diarrhea.  Patient developed atrial fibrillation with RVR, denies any chest pain but notes some SOB.  Pharmacy consulted to dose heparin.  No prior AC noted.  Today, 08/03/19  CBC: Hgb & Plt both stable,  HL = 0.53  therapeutic on heparin infusion at 700 units/hr (HL drawn at 0530 was drawn upstream from where heparin infusing therefore had level redrawn)  No bleeding reported  Scr 1.2    Goal of Therapy:  Heparin level 0.3-0.7 units/ml Monitor platelets by anticoagulation protocol:   Plan:   Continue heparin drip at 700 units/hr  Daily HL, CBC while on heparin  Long-term anticoagulation to be determined after all invasive procedures completed  Dolly Rias RPh 08/03/2019, 9:10 AM  Pharmacy consulted to dose eliquis for AFib  Pt >80yo and wt < 60kg  Stop heparin drip and start eliquis 2.5mg  po twice daily Pharmacy to provide education  Dolly Rias  RPh 08/03/2019, 10:29 AM

## 2019-08-03 NOTE — Progress Notes (Signed)
Physical Therapy Treatment Patient Details Name: Deanna [name] MRN: [id number] DOB: 05-Feb-1931 Today's Date: 08/03/2019    History of Present Illness 86 y.o. female with medical history significant of T2DM, HTN, depression, breast cancer, inflammatory diarrhea who presents with worsening symptoms of diarrhea for the last 6 weeks.  Pt admitted for Sepsis 2/2 Acute Colitis  Diarrhea  History of Inflammatory Diarrhea    PT Comments    Pt tolerates therapeutic exercises and short distance amb with RW without pain, dizziness or SOB. Pt limited in gait distance 2* possible fear of falling, reports "I didn't want to get too far from the room". Pt with improved steadiness with gait, though shorter distance. HR varied heavily 90s-15max noted during session and SpO2% 96-99%; RN checking in during therapy session. Patient will benefit from continued physical therapy in hospital and recommendations below to increase strength, balance, endurance for safe ADLs and gait.   Follow Up Recommendations  SNF;Supervision for mobility/OOB     Equipment Recommendations  None recommended by PT    Recommendations for Other Services       Precautions / Restrictions Precautions Precautions: Fall Precaution Comments: monitor HR Restrictions Weight Bearing Restrictions: No    Mobility  Bed Mobility  General bed mobility comments: in recliner upon arrival  Transfers Overall transfer level: Needs assistance Equipment used: Rolling walker (2 wheeled) Transfers: Sit to/from Stand Sit to Stand: Min guard  General transfer comment: slow and steady uses BUE to assist in powering up  Ambulation/Gait Ambulation/Gait assistance: Min guard Gait Distance (Feet): 20 Feet Assistive device: Rolling walker (2 wheeled) Gait Pattern/deviations: Step-through pattern;Decreased stride length;Narrow base of support Gait velocity: decreased   General Gait Details: short, slow, narrow steps using RW with good  steadiness, limited 2* fear of falling, HR up to 142 max with amb; denies pain, dizziness, SOB   Stairs             Wheelchair Mobility    Modified Rankin (Stroke Patients Only)       Balance Overall balance assessment: Needs assistance         Standing balance support: During functional activity;Bilateral upper extremity supported Standing balance-Leahy Scale: Poor Standing balance comment: reliant on RW             Cognition Arousal/Alertness: Awake/alert Behavior During Therapy: WFL for tasks assessed/performed Overall Cognitive Status: Within Functional Limits for tasks assessed           Exercises General Exercises - Lower Extremity Ankle Circles/Pumps: Seated;Both;15 reps (LE extended in recliner) Quad Sets: Seated;Both;15 reps (LE extended in recliner) Short Arc Quad: Seated;Both;15 reps (LE extended in recliner) Mini-Sqauts: 15 reps (chair behind)    General Comments General comments (skin integrity, edema, etc.): HR 90-140 with seated therapeutic exercise, HR up to 142 max with amb but sustained 120s for majority of ambulation      Pertinent Vitals/Pain Pain Assessment: No/denies pain    Home Living                      Prior Function            PT Goals (current goals can now be found in the care plan section) Acute Rehab PT Goals PT Goal Formulation: With patient Time For Goal Achievement: 08/13/19 Potential to Achieve Goals: Good Progress towards PT goals: Progressing toward goals    Frequency    Min 3X/week      PT Plan Current plan remains appropriate  Co-evaluation              AM-PAC PT "6 Clicks" Mobility   Outcome Measure  Help needed turning from your back to your side while in a flat bed without using bedrails?: A Little Help needed moving from lying on your back to sitting on the side of a flat bed without using bedrails?: A Little Help needed moving to and from a bed to a chair (including a  wheelchair)?: A Little Help needed standing up from a chair using your arms (e.g., wheelchair or bedside chair)?: A Little Help needed to walk in hospital room?: A Little Help needed climbing 3-5 steps with a railing? : A Little 6 Click Score: 18    End of Session Equipment Utilized During Treatment: Gait belt Activity Tolerance: Patient tolerated treatment well Patient left: in chair;with call bell/phone within reach;with chair alarm set;with family/visitor present Nurse Communication: Mobility status;Other (comment) (HR) PT Visit Diagnosis: Other abnormalities of gait and mobility (R26.89)     Time: 8563-1497 PT Time Calculation (min) (ACUTE ONLY): 32 min  Charges:  $Gait Training: 8-22 mins $Therapeutic Exercise: 8-22 mins                      Tori Annaka Cleaver PT, DPT 08/03/19, 10:57 AM

## 2019-08-03 NOTE — Progress Notes (Signed)
      Progress Note   Subjective  Patient doing well, diarrhea much improved. Tolerating diet. No pains. No bleeding symptoms back on heparin drip.    Objective   Vital signs in last 24 hours: Temp:  [97.6 F (36.4 C)-98.6 F (37 C)] 98.3 F (36.8 C) (06/25 1308) Pulse Rate:  [64-88] 88 (06/25 0633) Resp:  [14-20] 16 (06/25 6578) BP: (106-150)/(49-92) 139/92 (06/25 0633) SpO2:  [96 %-100 %] 99 % (06/25 4696) Weight:  [45.8 kg-47.4 kg] 46 kg (06/25 0747) Last BM Date: 08/02/19 General:    white female in NAD Heart:  Irregularly irregular Lungs: Respirations even and unlabored Abdomen:  Soft, nontender and nondistended.  Extremities:  Without edema. Neurologic:  Alert and oriented,  grossly normal neurologically. Psych:  Cooperative. Normal mood and affect.  Intake/Output from previous day: 06/24 0701 - 06/25 0700 In: 368.2 [P.O.:120; I.V.:248.2] Out: -  Intake/Output this shift: No intake/output data recorded.  Lab Results: Recent Labs    08/01/19 0425 08/02/19 0549 08/03/19 0530  WBC 18.9* 19.2* 16.0*  HGB 12.1 12.7 11.6*  HCT 36.6 39.2 34.8*  PLT 215 247 240   BMET Recent Labs    08/01/19 0425 08/02/19 0549 08/03/19 0530  NA 140 141 139  K 3.1* 2.9* 4.3  CL 103 109 111  CO2 25 19* 19*  GLUCOSE 197* 166* 153*  BUN 41* 27* 30*  CREATININE 1.70* 1.39* 1.21*  CALCIUM 9.2 9.3 9.4   LFT Recent Labs    08/01/19 0425  PROT 6.8  ALBUMIN 3.3*  AST 13*  ALT 16  ALKPHOS 55  BILITOT 0.7   PT/INR No results for input(s): LABPROT, INR in the last 72 hours.  Studies/Results: No results found.     Assessment / Plan:    86 y/o female with chronic diarrhea, admitted with worsening symptoms, noted to be in AF with RVR. Followed by Dr. Earlie Raveling as outpatient, diagnosed with "inflammatory colitis" based on stool study workup, had held off on colonoscopy up to this point and treated empirically with Lialda with some improvement but came in with flare of  symptoms.   Colonoscopy done yesterday showing multiple deep ulcerations in the left colon, with possible cecal patch vs. sessile serrated polyp of the AO. Infectious workup negative, and improvement with IV steroids. I suspect she more than likely has Crohn's colitis, which is severe on endoscopic evaluation. I think ultimately she will warrant biologic therapy to treat this, defer specific regimen to her primary GI MD. I don't think Lialda is providing any benefit at this point and would stop it. For management of this now, would transition from IV solumedrol to 40mg  / day for one week and then taper by 5mg  / week until done. Pathology from colonoscopy should be back next week sometime and will relay that to the patient and Dr. Earlean Shawl.   Defer timing of discharge to primary team / cardiology. She has mesenteric mass noted and may have biopsy prior to discharge. 5HIAA normal.   Recommend: - transition from IV solumedrol to prednisone 40mg  / day for one week, and then taper by 5mg  / week until done - await pathology results, should be back next week - can stop Lialda as outpatient - IR guided biopsy of mesenteric mass  We will sign off for now, call with questions moving forward.  Bradley Cellar, MD [facility]

## 2019-08-03 NOTE — Progress Notes (Signed)
Interventional Radiology Brief Note:  Patient with history of inflammatory diarrhea presented to [facility] ED with worsening symptoms, diarrhea for the past 6 weeks.  A CT Abdomen Pelvis 07/27/19 showed: 1. Fluid-filled colon with mild wall thickening over the distal descending and sigmoid colon which may be due to an acute colitis of infectious or inflammatory nature. Small amount of associated free fluid in the pelvis. No obstruction. 2. Mass over the mid mesentery with associated coarse calcification measuring 3.1 x 4 cm. Few small associated mesenteric lymph nodes. Findings may be due to a neoplastic process such as carcinoid tumor.  She was diagnosed acute colitis with sepsis as well as new paroxysmal atrial fibrillation.  She has been treated with IV abx for the past week and is nearing discharge.  She has been evaluated by cardiology who recommends Eliquis for her PAF.   IR consulted for biopsy of the mesenteric mass prior to discharge.   Case reviewed by Dr. Pascal Lux who has further collaborated with Dr. Leonia Reeves.  Recommendation made for DOATATE PET in lue of biopsy as DOATATE PET is specific for carcinoid tumor.  Discussed with Dr. Lonny Prude.   Brynda Greathouse, MS RD PA-C

## 2019-08-04 LAB — GLUCOSE, CAPILLARY
Glucose-Capillary: 174 mg/dL — ABNORMAL HIGH (ref 70–99)
Glucose-Capillary: 119 mg/dL — ABNORMAL HIGH (ref 70–99)
Glucose-Capillary: 258 mg/dL — ABNORMAL HIGH (ref 70–99)
Glucose-Capillary: 230 mg/dL — ABNORMAL HIGH (ref 70–99)

## 2019-08-04 NOTE — Progress Notes (Signed)
Foley cath removed 6/26 at 1400, patient had until 2200 to urinate per voiding trial guidelines. Patient ambulated to restroom at 2000 and 2200 and was unable to urinate. Bladder scan amount >350 both times, patient expressed she had no urge to void. Per orders, foley catheter replaced. Will continue to monitor.

## 2019-08-04 NOTE — Progress Notes (Signed)
Progress Note   Subjective   Doing well today, the patient denies CP or SOB.  No new concerns  Inpatient Medications    Scheduled Meds: . apixaban  2.5 mg Oral BID  . buPROPion  150 mg Oral Daily  . carvedilol  25 mg Oral BID WC  . Chlorhexidine Gluconate Cloth  6 each Topical Daily  . diltiazem  120 mg Oral Daily  . diltiazem  180 mg Oral Daily  . escitalopram  10 mg Oral Daily  . ferrous sulfate  325 mg Oral Q breakfast  . insulin aspart  0-5 Units Subcutaneous QHS  . insulin aspart  0-9 Units Subcutaneous TID WC  . insulin aspart  2 Units Subcutaneous TID WC  . insulin glargine  5 Units Subcutaneous Daily  . loperamide  2 mg Oral QID  . mirabegron ER  50 mg Oral QHS  . pantoprazole  40 mg Oral Daily  . predniSONE  40 mg Oral Daily  . pregabalin  75 mg Oral BID  . vitamin B-12  1,000 mcg Oral Daily  . vitamin E  400 Units Oral BID   Continuous Infusions: . sodium chloride 10 mL/hr at 08/01/19 0600   PRN Meds: sodium chloride, acetaminophen **OR** acetaminophen, labetalol, metoprolol tartrate, morphine injection, ondansetron **OR** ondansetron (ZOFRAN) IV, oxyCODONE   Vital Signs    Vitals:   08/03/19 0747 08/03/19 1315 08/03/19 2103 08/04/19 0520  BP:  108/75 102/64 119/64  Pulse:  80 73 70  Resp:  20 18 17   Temp:  98.1 F (36.7 C) 97.7 F (36.5 C) 98.7 F (37.1 C)  TempSrc:  Oral Oral   SpO2:  97% 99% 100%  Weight: 46 kg   45.7 kg  Height:        Intake/Output Summary (Last 24 hours) at 08/04/2019 0815 Last data filed at 08/04/2019 0500 Gross per 24 hour  Intake 120 ml  Output 1200 ml  Net -1080 ml   Filed Weights   08/03/19 0737 08/03/19 0747 08/04/19 0520  Weight: 45.8 kg 46 kg 45.7 kg    Telemetry    Rate controlled afib - Personally Reviewed  Physical Exam   GEN- The patient is elderly appearing, sleeping but rouses, NAD   Head- normocephalic, atraumatic Eyes-  Sclera clear, conjunctiva pink Ears- hearing intact Oropharynx-  clear Neck- supple, Lungs-  normal work of breathing Heart- irregular rate and rhythm  GI- soft  Extremities- no clubbing, cyanosis, or edema  MS- diffuse atrophy Skin- no rash or lesion Psych- euthymic mood, full affect Neuro- strength and sensation are intact   Labs    Chemistry Recent Labs  Lab 07/30/19 0518 07/30/19 0518 07/31/19 0746 07/31/19 0746 08/01/19 0425 08/02/19 0549 08/03/19 0530  NA 136   < > 141   < > 140 141 139  K 3.5   < > 3.1*   < > 3.1* 2.9* 4.3  CL 105   < > 102   < > 103 109 111  CO2 20*   < > 23   < > 25 19* 19*  GLUCOSE 190*   < > 197*   < > 197* 166* 153*  BUN 21   < > 30*   < > 41* 27* 30*  CREATININE 1.04*   < > 1.45*   < > 1.70* 1.39* 1.21*  CALCIUM 9.7   < > 9.5   < > 9.2 9.3 9.4  PROT 7.3  --  7.4  --  6.8  --   --  ALBUMIN 3.3*  --  3.3*  --  3.3*  --   --   AST 20  --  14*  --  13*  --   --   ALT 20  --  16  --  16  --   --   ALKPHOS 66  --  61  --  55  --   --   BILITOT 0.8  --  1.0  --  0.7  --   --   GFRNONAA 48*   < > 32*   < > 27* 34* 40*  GFRAA 56*   < > 37*   < > 31* 39* 47*  ANIONGAP 11   < > 16*   < > 12 13 9    < > = values in this interval not displayed.     Hematology Recent Labs  Lab 08/01/19 0425 08/02/19 0549 08/03/19 0530  WBC 18.9* 19.2* 16.0*  RBC 4.35 4.55 4.04  HGB 12.1 12.7 11.6*  HCT 36.6 39.2 34.8*  MCV 84.1 86.2 86.1  MCH 27.8 27.9 28.7  MCHC 33.1 32.4 33.3  RDW 14.6 14.9 15.1  PLT 215 247 240   Echo 07/31/19- EF 60%   Patient ID     Deanna [name] is an 6F with chronic left bundle branch block, diabetes, mild pulmonary hypertension admitted with sepsis, colitis, and UTI.  Cardiology consulted for afib.  Assessment & Plan    1.  Persistent afib The patient is in afib in the setting of colitis and septis.  She is currently rate controlled.  Continue current strategy.  Her V rates should continue to improve with treatment of coliits.  She has been started on eliquis.    No further inpatient  management of her afib is required. I will arrange follow-up in the AF clinic  Cardiology to see as needed this weekend.  Thompson Grayer MD, [facility] 08/04/2019 8:15 AM

## 2019-08-04 NOTE — Progress Notes (Signed)
PROGRESS NOTE    Deanna [name]  DQQ:[id number] DOB: 19-Sep-1930 DOA: 07/27/2019 PCP: Marton Redwood, MD   Brief Narrative: Deanna [name] is a 86 y.o. female with a history of type 2 diabetes, hypertension, depression, breast cancer, inflammatory diarrhea.  She presented secondary to worsening diarrhea over the last 6 weeks.  Initial work-up for infectious etiology was negative.  Concern that this is a recurrent inflammatory process.  Patient has improved during her hospital stay.  GI consulted for further evaluation.   Assessment & Plan:   Active Problems:   Colitis   Atrial fibrillation with RVR (HCC)   Acute respiratory failure with hypoxia (HCC)   Sepsis without acute organ dysfunction (HCC)   Diarrhea   Acute colitis Initial concern for infection versus inflammatory process.  Patient with a history of inflammatory disease.  GI pathogen panel and C. difficile testing have been negative.  Patient started on Solu-Medrol IV with improvement in symptoms.  Gastroenterology was consulted and performed colonoscopy on 6/24 which was concerning for Crohn's disease; biopsies obtained and are pending. -Gastroenterology recommendations: Prednisone 40 mg daily x1 week, then taper down 5 mg per week until completed. Signed off. Patient will need follow-up with her primary gastroenterologist, Dr. Earlean Shawl.  Sepsis Present on admission.  Secondary to acute colitis.  Physiology resolved.  Blood cultures with no growth.  Atrial fibrillation with RVR New onset.  Echo obtained on 6/22 which was significant for normal EF and atria.  Patient started on diltiazem drip in addition to heparin drip.  Cardiology consulted on 6/23. Transitioned to Cardizem PO -Cardiology recommendations: Cardizem CD 180 mg daily, Coreg 25 mg BID, started on Eliquis 2.5 mg BID dosing  Pulmonary edema Acute on chronic diastolic heart failure Patient was treated with IV Lasix with prompt improvement on repeat chest  imaging.  CKD stage IIIa Baseline creatinine of 1.6.  Creatinine of 1.77 on presentation with improvement down to 1.04. Slight worsening secondary to overdiuresis which is now resolving. -Hold lasix  Mass of the mid mesentery Seen on imaging.  Concern for possible neoplastic process.  Per chart review, discussed with oncology who recommends outpatient oncology work-up. Discussed with interventional radiology for consideration of biopsy who recommend DOTATATE PET as an outpatient.  Iron deficiency anemia -Continue iron supplementation  Diabetes mellitus, type II Hemoglobin A1c of 6.3%.  Lantus started secondary to steroid initiation. -Continue Lantus and SSI  Peripheral neuropathy Patient is on Lyrica as an outpatient.  She is on on a 150 mg twice daily. -Continue Lyrica 75 mg twice daily (renally dosed)  Depression -Continue Wellbutrin and Lexapro  Essential hypertension Antihypertensives initially held secondary to sepsis.  Blood pressure is currently controlled. -Continue Coreg 25 mg twice daily -Diltiazem as mentioned above  Hypokalemia -Replete as needed  Urinary retention Patient failed voiding trial.  Foley replaced on 6/22.  Unsure of etiology. Patient is wanting to have another voiding trial to possibly be discharged without a foley -Will perform a second voiding trial  Leukocytosis Significant jump correlating with initiation of IV solu-medrol. No evidence of infection otherwise. Trending down.   DVT prophylaxis: Heparin drip Code Status:   Code Status: DNR Family Communication: None at bedside Disposition Plan: Discharge to SNF in three days pending cardiology titration of diltiazem in addition to SNF having ability to admit patient (unable over the weekend)   Consultants:   Gastroenterology  Cardiology  Procedures:   TRANSTHORACIC ECHOCARDIOGRAM (08/01/2019) IMPRESSIONS    1. Left ventricular ejection fraction, by estimation,  is 60 to 65%. The   left ventricle has normal function. The left ventricle has no regional  wall motion abnormalities. There is moderate concentric left ventricular  hypertrophy. Left ventricular  diastolic parameters are indeterminate. Elevated left ventricular  end-diastolic pressure.  2. Right ventricular systolic function is normal. The right ventricular  size is normal. There is mildly elevated pulmonary artery systolic  pressure. The estimated right ventricular systolic pressure is 93.2 mmHg.  3. The mitral valve is normal in structure. Mild mitral valve  regurgitation. No evidence of mitral stenosis.  4. Tricuspid valve regurgitation is moderate.  5. The aortic valve is tricuspid. Aortic valve regurgitation is not  visualized. Mild to moderate aortic valve sclerosis/calcification is  present, without any evidence of aortic stenosis.  6. The inferior vena cava is normal in size with greater than 50%  respiratory variability, suggesting right atrial pressure of 3 mmHg.   Antimicrobials:  Zosyn IV   Subjective: No diarrhea. No regular bowel movement yet since bowel prep.  Objective: Vitals:   08/03/19 0747 08/03/19 1315 08/03/19 2103 08/04/19 0520  BP:  108/75 102/64 119/64  Pulse:  80 73 70  Resp:  20 18 17   Temp:  98.1 F (36.7 C) 97.7 F (36.5 C) 98.7 F (37.1 C)  TempSrc:  Oral Oral   SpO2:  97% 99% 100%  Weight: 46 kg   45.7 kg  Height:        Intake/Output Summary (Last 24 hours) at 08/04/2019 1216 Last data filed at 08/04/2019 1100 Gross per 24 hour  Intake 240 ml  Output 700 ml  Net -460 ml   Filed Weights   08/03/19 0737 08/03/19 0747 08/04/19 0520  Weight: 45.8 kg 46 kg 45.7 kg    Examination:  General exam: Appears calm and comfortable Respiratory system: Clear to auscultation. Respiratory effort normal. Cardiovascular system: S1 & S2 heard, Irregular rhythm, normal rate. No murmurs, rubs, gallops or clicks. Gastrointestinal system: Abdomen is slightly  distended, soft and nontender. No organomegaly or masses felt. Normal bowel sounds heard. Central nervous system: Alert and oriented. No focal neurological deficits. Musculoskeletal: No edema. No calf tenderness Skin: No cyanosis. No rashes Psychiatry: Judgement and insight appear normal. Mood & affect appropriate.     Data Reviewed: I have personally reviewed following labs and imaging studies  CBC Lab Results  Component Value Date   WBC 16.0 (H) 08/03/2019   RBC 4.04 08/03/2019   HGB 11.6 (L) 08/03/2019   HCT 34.8 (L) 08/03/2019   MCV 86.1 08/03/2019   MCH 28.7 08/03/2019   PLT 240 08/03/2019   MCHC 33.3 08/03/2019   RDW 15.1 08/03/2019   LYMPHSABS 3.3 07/30/2019   MONOABS 0.8 07/30/2019   EOSABS 0.0 07/30/2019   BASOSABS 0.0 35/57/3220     Last metabolic panel Lab Results  Component Value Date   NA 139 08/03/2019   K 4.3 08/03/2019   CL 111 08/03/2019   CO2 19 (L) 08/03/2019   BUN 30 (H) 08/03/2019   CREATININE 1.21 (H) 08/03/2019   GLUCOSE 153 (H) 08/03/2019   GFRNONAA 40 (L) 08/03/2019   GFRAA 47 (L) 08/03/2019   CALCIUM 9.4 08/03/2019   PHOS 2.9 08/01/2019   PROT 6.8 08/01/2019   ALBUMIN 3.3 (L) 08/01/2019   BILITOT 0.7 08/01/2019   ALKPHOS 55 08/01/2019   AST 13 (L) 08/01/2019   ALT 16 08/01/2019   ANIONGAP 9 08/03/2019    CBG (last 3)  Recent Labs    08/03/19 2101  08/04/19 0738 08/04/19 1157  GLUCAP 244* 174* 119*     GFR: Estimated Creatinine Clearance: 23.6 mL/min (A) (by C-G formula based on SCr of 1.21 mg/dL (H)).  Coagulation Profile: No results for input(s): INR, PROTIME in the last 168 hours.  Recent Results (from the past 240 hour(s))  Urine culture     Status: None   Collection Time: 07/27/19  1:45 AM   Specimen: Urine, Random  Result Value Ref Range Status   Specimen Description   Final    URINE, RANDOM Performed at [facility] [address] [place], [place] [address]    Special Requests   Final     NONE Performed at [facility], [place] [address], [place], [place] [address]    Culture   Final    NO GROWTH Performed at [facility], [place] [address], [place], [place] [address]    Report Status 07/29/2019 FINAL  Final  Culture, blood (Routine X 2) w Reflex to ID Panel     Status: None   Collection Time: 07/27/19  1:24 PM   Specimen: BLOOD RIGHT HAND  Result Value Ref Range Status   Specimen Description   Final    BLOOD RIGHT HAND Performed at [facility], [place] [address] [place], [place] [address]    Special Requests   Final    BOTTLES DRAWN AEROBIC AND ANAEROBIC Blood Culture adequate volume Performed at [facility] [address] [place], [place] [address]    Culture   Final    NO GROWTH 5 DAYS Performed at [facility], [place] [address], [place], [place] [address]    Report Status 08/01/2019 FINAL  Final  Culture, blood (Routine X 2) w Reflex to ID Panel     Status: None   Collection Time: 07/27/19  1:29 PM   Specimen: BLOOD  Result Value Ref Range Status   Specimen Description   Final    BLOOD LEFT ANTECUBITAL Performed at [facility] [address], [place], [place] [address]    Special Requests   Final    BOTTLES DRAWN AEROBIC AND ANAEROBIC Blood Culture results may not be optimal due to an inadequate volume of blood received in culture bottles   Culture   Final    NO GROWTH 5 DAYS Performed at [facility], [place] [address] [place], [place] [address]    Report Status 08/01/2019 FINAL  Final  SARS Coronavirus 2 by RT PCR (hospital order, performed in [facility] hospital lab) Nasopharyngeal Nasopharyngeal Swab     Status: None   Collection Time: 07/27/19  4:21 PM   Specimen: Nasopharyngeal Swab  Result Value Ref Range Status   SARS Coronavirus 2 NEGATIVE NEGATIVE Final    Comment: (NOTE) SARS-CoV-2 target nucleic acids are NOT DETECTED.  The SARS-CoV-2 RNA is generally detectable  in upper and lower respiratory specimens during the acute phase of infection. The lowest concentration of SARS-CoV-2 viral copies this assay can detect is 250 copies / mL. A negative result does not preclude SARS-CoV-2 infection and should not be used as the sole basis for treatment or other patient management decisions.  A negative result may occur with improper specimen collection / handling, submission of specimen other than nasopharyngeal swab, presence of viral mutation(s) within the areas targeted by this assay, and inadequate number of viral copies (<250 copies / mL). A negative result must be combined with clinical observations, patient  history, and epidemiological information.  Fact Sheet for Patients:   StrictlyIdeas.no  Fact Sheet for Healthcare Providers: BankingDealers.co.za  This test is not yet approved or  cleared by the [place] FDA and has been authorized for detection and/or diagnosis of SARS-CoV-2 by FDA under an Emergency Use Authorization (EUA).  This EUA will remain in effect (meaning this test can be used) for the duration of the COVID-19 declaration under Section 564(b)(1) of the Act, 21 U.S.C. section 360bbb-3(b)(1), unless the authorization is terminated or revoked sooner.  Performed at [facility], [place] [address] [place], [place] [address]   C Difficile Quick Screen w PCR reflex     Status: None   Collection Time: 07/27/19  5:38 PM   Specimen: STOOL  Result Value Ref Range Status   C Diff antigen NEGATIVE NEGATIVE Final   C Diff toxin NEGATIVE NEGATIVE Final   C Diff interpretation No C. difficile detected.  Final    Comment: VALID Performed at [facility], [place] [address] [place], [place] [address]   Gastrointestinal Panel by PCR , Stool     Status: None   Collection Time: 07/27/19  5:38 PM   Specimen: STOOL  Result Value Ref Range Status   Campylobacter  species NOT DETECTED NOT DETECTED Final   Plesimonas shigelloides NOT DETECTED NOT DETECTED Final   Salmonella species NOT DETECTED NOT DETECTED Final   Yersinia enterocolitica NOT DETECTED NOT DETECTED Final   Vibrio species NOT DETECTED NOT DETECTED Final   Vibrio cholerae NOT DETECTED NOT DETECTED Final   Enteroaggregative E coli (EAEC) NOT DETECTED NOT DETECTED Final   Enteropathogenic E coli (EPEC) NOT DETECTED NOT DETECTED Final   Enterotoxigenic E coli (ETEC) NOT DETECTED NOT DETECTED Final   Shiga like toxin producing E coli (STEC) NOT DETECTED NOT DETECTED Final   Shigella/Enteroinvasive E coli (EIEC) NOT DETECTED NOT DETECTED Final   Cryptosporidium NOT DETECTED NOT DETECTED Final   Cyclospora cayetanensis NOT DETECTED NOT DETECTED Final   Entamoeba histolytica NOT DETECTED NOT DETECTED Final   Giardia lamblia NOT DETECTED NOT DETECTED Final   Adenovirus F40/41 NOT DETECTED NOT DETECTED Final   Astrovirus NOT DETECTED NOT DETECTED Final   Norovirus GI/GII NOT DETECTED NOT DETECTED Final   Rotavirus A NOT DETECTED NOT DETECTED Final   Sapovirus (I, II, IV, and V) NOT DETECTED NOT DETECTED Final    Comment: Performed at [facility], [address], [place], [place] [address]        Radiology Studies: No results found.      Scheduled Meds: . apixaban  2.5 mg Oral BID  . buPROPion  150 mg Oral Daily  . carvedilol  25 mg Oral BID WC  . Chlorhexidine Gluconate Cloth  6 each Topical Daily  . diltiazem  120 mg Oral Daily  . diltiazem  180 mg Oral Daily  . escitalopram  10 mg Oral Daily  . ferrous sulfate  325 mg Oral Q breakfast  . insulin aspart  0-5 Units Subcutaneous QHS  . insulin aspart  0-9 Units Subcutaneous TID WC  . insulin aspart  2 Units Subcutaneous TID WC  . insulin glargine  5 Units Subcutaneous Daily  . loperamide  2 mg Oral QID  . mirabegron ER  50 mg Oral QHS  . pantoprazole  40 mg Oral Daily  . predniSONE  40 mg Oral Daily  . pregabalin   75 mg Oral BID  . vitamin B-12  1,000 mcg  Oral Daily  . vitamin E  400 Units Oral BID   Continuous Infusions: . sodium chloride 10 mL/hr at 08/01/19 0600     LOS: 8 days     Cordelia Poche, MD [facility] 08/04/2019, 12:16 PM  If 7PM-7AM, please contact night-coverage [email or web address]

## 2019-08-05 LAB — GLUCOSE, CAPILLARY
Glucose-Capillary: 232 mg/dL — ABNORMAL HIGH (ref 70–99)
Glucose-Capillary: 245 mg/dL — ABNORMAL HIGH (ref 70–99)
Glucose-Capillary: 359 mg/dL — ABNORMAL HIGH (ref 70–99)
Glucose-Capillary: 210 mg/dL — ABNORMAL HIGH (ref 70–99)

## 2019-08-05 MED ORDER — LOPERAMIDE HCL 2 MG PO CAPS: 2.0000 mg | ORAL_CAPSULE | ORAL | Status: DC | PRN

## 2019-08-05 NOTE — Progress Notes (Signed)
Received report from nurse, agree with previous RN assessment, will continue POC

## 2019-08-05 NOTE — Progress Notes (Signed)
PROGRESS NOTE    [name]  PYP:[id number] DOB: [birth year] DOA: 07/27/2019 PCP: Marton Redwood, MD   Brief Narrative: Deanna [name] is a 86 y.o. female with a history of type 2 diabetes, hypertension, depression, breast cancer, inflammatory diarrhea.  She presented secondary to worsening diarrhea over the last 6 weeks.  Initial work-up for infectious etiology was negative.  Concern that this is a recurrent inflammatory process.  Patient has improved during her hospital stay.  GI consulted for further evaluation.   Assessment & Plan:   Active Problems:   Colitis   Atrial fibrillation with RVR (HCC)   Acute respiratory failure with hypoxia (HCC)   Sepsis without acute organ dysfunction (HCC)   Diarrhea   Acute colitis Initial concern for infection versus inflammatory process.  Patient with a history of inflammatory disease.  GI pathogen panel and C. difficile testing have been negative.  Patient started on Solu-Medrol IV with improvement in symptoms.  Gastroenterology was consulted and performed colonoscopy on 6/24 which was concerning for Crohn's disease; biopsies obtained and are pending. -Gastroenterology recommendations: Prednisone 40 mg daily x1 week, then taper down 5 mg per week until completed. Signed off. Patient will need follow-up with her primary gastroenterologist, Dr. Earlean Shawl.  Sepsis Present on admission.  Secondary to acute colitis.  Physiology resolved.  Blood cultures with no growth.  Atrial fibrillation with RVR New onset.  Echo obtained on 6/22 which was significant for normal EF and atria.  Patient started on diltiazem drip in addition to heparin drip.  Cardiology consulted on 6/23. Transitioned to Cardizem PO. Patient getting a total of Cardizem CD 360 mg daily -Cardiology recommendations: Cardizem CD 180 mg daily, Coreg 25 mg BID, started on Eliquis 2.5 mg BID dosing; will need to clarify Cardizem dosing  Pulmonary edema Acute on chronic diastolic heart  failure Patient was treated with IV Lasix with prompt improvement on repeat chest imaging.  CKD stage IIIa Baseline creatinine of 1.6.  Creatinine of 1.77 on presentation with improvement down to 1.04. Slight worsening secondary to overdiuresis which is now resolving. -Hold lasix  Mass of the mid mesentery Seen on imaging.  Concern for possible neoplastic process.  Per chart review, discussed with oncology who recommends outpatient oncology work-up. Discussed with interventional radiology for consideration of biopsy who recommend DOTATATE PET as an outpatient.  Iron deficiency anemia -Continue iron supplementation  Diabetes mellitus, type II Hemoglobin A1c of 6.3%.  Lantus started secondary to steroid initiation. -Continue Lantus and SSI  Peripheral neuropathy Patient is on Lyrica as an outpatient.  She is on on a 150 mg twice daily. -Continue Lyrica 75 mg twice daily (renally dosed)  Depression -Continue Wellbutrin and Lexapro  Essential hypertension Antihypertensives initially held secondary to sepsis.  Blood pressure is currently controlled. -Continue Coreg 25 mg twice daily -Diltiazem as mentioned above  Hypokalemia -Replete as needed  Urinary retention Patient failed voiding trial x2.  Foley replaced on 6/22.  Unsure of etiology but patient is on Myrbetriq.  -Discontinue Myrbetriq -Outpatient Urology follow-up  Leukocytosis Significant jump correlating with initiation of IV solu-medrol. No evidence of infection otherwise. Trending down.   DVT prophylaxis: Eliquis Code Status:   Code Status: DNR Family Communication: None at bedside Disposition Plan: Discharge to SNF tomorrow pending bed availability   Consultants:   Gastroenterology  Cardiology  Procedures:   TRANSTHORACIC ECHOCARDIOGRAM (08/01/2019) IMPRESSIONS    1. Left ventricular ejection fraction, by estimation, is 60 to 65%. The  left ventricle has normal function.  The left ventricle has no  regional  wall motion abnormalities. There is moderate concentric left ventricular  hypertrophy. Left ventricular  diastolic parameters are indeterminate. Elevated left ventricular  end-diastolic pressure.  2. Right ventricular systolic function is normal. The right ventricular  size is normal. There is mildly elevated pulmonary artery systolic  pressure. The estimated right ventricular systolic pressure is 35.3 mmHg.  3. The mitral valve is normal in structure. Mild mitral valve  regurgitation. No evidence of mitral stenosis.  4. Tricuspid valve regurgitation is moderate.  5. The aortic valve is tricuspid. Aortic valve regurgitation is not  visualized. Mild to moderate aortic valve sclerosis/calcification is  present, without any evidence of aortic stenosis.  6. The inferior vena cava is normal in size with greater than 50%  respiratory variability, suggesting right atrial pressure of 3 mmHg.   Antimicrobials:  Zosyn IV   Subjective: No diarrhea. Failed voiding trial  Objective: Vitals:   08/04/19 1401 08/04/19 2047 08/05/19 0624 08/05/19 0954  BP: 110/74 130/62 (!) 101/56 115/68  Pulse: 76 (!) 58 65   Resp: 14 20 16    Temp: 98.1 F (36.7 C) 98 F (36.7 C) 98 F (36.7 C)   TempSrc: Oral Oral Oral   SpO2: 97% 96% 98%   Weight:   47.8 kg   Height:        Intake/Output Summary (Last 24 hours) at 08/05/2019 1226 Last data filed at 08/05/2019 6144 Gross per 24 hour  Intake 240 ml  Output 1200 ml  Net -960 ml   Filed Weights   08/03/19 0747 08/04/19 0520 08/05/19 0624  Weight: 46 kg 45.7 kg 47.8 kg    Examination:  General exam: Appears calm and comfortable Respiratory system: Clear to auscultation. Respiratory effort normal. Cardiovascular system: S1 & S2 heard, Irregular rhythm, normal rate. Gastrointestinal system: Abdomen is nondistended, soft and nontender. No organomegaly or masses felt. Normal bowel sounds heard. Central nervous system: Alert and  oriented. No focal neurological deficits. Musculoskeletal: No edema. No calf tenderness Skin: No cyanosis. No rashes Psychiatry: Judgement and insight appear normal. Mood & affect appropriate    Data Reviewed: I have personally reviewed following labs and imaging studies  CBC Lab Results  Component Value Date   WBC 16.0 (H) 08/03/2019   RBC 4.04 08/03/2019   HGB 11.6 (L) 08/03/2019   HCT 34.8 (L) 08/03/2019   MCV 86.1 08/03/2019   MCH 28.7 08/03/2019   PLT 240 08/03/2019   MCHC 33.3 08/03/2019   RDW 15.1 08/03/2019   LYMPHSABS 3.3 07/30/2019   MONOABS 0.8 07/30/2019   EOSABS 0.0 07/30/2019   BASOSABS 0.0 31/54/0086     Last metabolic panel Lab Results  Component Value Date   NA 139 08/03/2019   K 4.3 08/03/2019   CL 111 08/03/2019   CO2 19 (L) 08/03/2019   BUN 30 (H) 08/03/2019   CREATININE 1.21 (H) 08/03/2019   GLUCOSE 153 (H) 08/03/2019   GFRNONAA 40 (L) 08/03/2019   GFRAA 47 (L) 08/03/2019   CALCIUM 9.4 08/03/2019   PHOS 2.9 08/01/2019   PROT 6.8 08/01/2019   ALBUMIN 3.3 (L) 08/01/2019   BILITOT 0.7 08/01/2019   ALKPHOS 55 08/01/2019   AST 13 (L) 08/01/2019   ALT 16 08/01/2019   ANIONGAP 9 08/03/2019    CBG (last 3)  Recent Labs    08/04/19 2109 08/05/19 0727 08/05/19 1133  GLUCAP 230* 232* 245*     GFR: Estimated Creatinine Clearance: 24.7 mL/min (A) (by C-G formula  based on SCr of 1.21 mg/dL (H)).  Coagulation Profile: No results for input(s): INR, PROTIME in the last 168 hours.  Recent Results (from the past 240 hour(s))  Urine culture     Status: None   Collection Time: 07/27/19  1:45 AM   Specimen: Urine, Random  Result Value Ref Range Status   Specimen Description   Final    URINE, RANDOM Performed at [facility] [address] [place], [place] [address]    Special Requests   Final    NONE Performed at [facility], [place] [address], [place], [place] [address]    Culture   Final    NO  GROWTH Performed at [facility], [place] [address] [place], [place] [address]    Report Status 07/29/2019 FINAL  Final  Culture, blood (Routine X 2) w Reflex to ID Panel     Status: None   Collection Time: 07/27/19  1:24 PM   Specimen: BLOOD RIGHT HAND  Result Value Ref Range Status   Specimen Description   Final    BLOOD RIGHT HAND Performed at [facility], [place] [address], [place], [place] [address]    Special Requests   Final    BOTTLES DRAWN AEROBIC AND ANAEROBIC Blood Culture adequate volume Performed at [facility] [address] [place], [place] [address]    Culture   Final    NO GROWTH 5 DAYS Performed at [facility], [place] [address], [place], [place] [address]    Report Status 08/01/2019 FINAL  Final  Culture, blood (Routine X 2) w Reflex to ID Panel     Status: None   Collection Time: 07/27/19  1:29 PM   Specimen: BLOOD  Result Value Ref Range Status   Specimen Description   Final    BLOOD LEFT ANTECUBITAL Performed at [facility] [address] [place], [place] [address]    Special Requests   Final    BOTTLES DRAWN AEROBIC AND ANAEROBIC Blood Culture results may not be optimal due to an inadequate volume of blood received in culture bottles   Culture   Final    NO GROWTH 5 DAYS Performed at [facility], [place] [address], [place], [place] [address]    Report Status 08/01/2019 FINAL  Final  SARS Coronavirus 2 by RT PCR (hospital order, performed in [facility] hospital lab) Nasopharyngeal Nasopharyngeal Swab     Status: None   Collection Time: 07/27/19  4:21 PM   Specimen: Nasopharyngeal Swab  Result Value Ref Range Status   SARS Coronavirus 2 NEGATIVE NEGATIVE Final    Comment: (NOTE) SARS-CoV-2 target nucleic acids are NOT DETECTED.  The SARS-CoV-2 RNA is generally detectable in upper and lower respiratory specimens during the acute phase of infection. The lowest concentration of SARS-CoV-2  viral copies this assay can detect is 250 copies / mL. A negative result does not preclude SARS-CoV-2 infection and should not be used as the sole basis for treatment or other patient management decisions.  A negative result may occur with improper specimen collection / handling, submission of specimen other than nasopharyngeal swab, presence of viral mutation(s) within the areas targeted by this assay, and inadequate number of viral copies (<250 copies / mL). A negative result must be combined with clinical observations, patient history, and epidemiological information.  Fact Sheet for Patients:   StrictlyIdeas.no  Fact Sheet for Healthcare Providers: BankingDealers.co.za  This test is  not yet approved or  cleared by the [place] and has been authorized for detection and/or diagnosis of SARS-CoV-2 by FDA under an Emergency Use Authorization (EUA).  This EUA will remain in effect (meaning this test can be used) for the duration of the COVID-19 declaration under Section 564(b)(1) of the Act, 21 U.S.C. section 360bbb-3(b)(1), unless the authorization is terminated or revoked sooner.  Performed at [facility], [place] [address], [place], [place] [address]   C Difficile Quick Screen w PCR reflex     Status: None   Collection Time: 07/27/19  5:38 PM   Specimen: STOOL  Result Value Ref Range Status   C Diff antigen NEGATIVE NEGATIVE Final   C Diff toxin NEGATIVE NEGATIVE Final   C Diff interpretation No C. difficile detected.  Final    Comment: VALID Performed at [facility], [place] [address], [place], [place] [address]   Gastrointestinal Panel by PCR , Stool     Status: None   Collection Time: 07/27/19  5:38 PM   Specimen: STOOL  Result Value Ref Range Status   Campylobacter species NOT DETECTED NOT DETECTED Final   Plesimonas shigelloides NOT DETECTED NOT DETECTED Final   Salmonella  species NOT DETECTED NOT DETECTED Final   Yersinia enterocolitica NOT DETECTED NOT DETECTED Final   Vibrio species NOT DETECTED NOT DETECTED Final   Vibrio cholerae NOT DETECTED NOT DETECTED Final   Enteroaggregative E coli (EAEC) NOT DETECTED NOT DETECTED Final   Enteropathogenic E coli (EPEC) NOT DETECTED NOT DETECTED Final   Enterotoxigenic E coli (ETEC) NOT DETECTED NOT DETECTED Final   Shiga like toxin producing E coli (STEC) NOT DETECTED NOT DETECTED Final   Shigella/Enteroinvasive E coli (EIEC) NOT DETECTED NOT DETECTED Final   Cryptosporidium NOT DETECTED NOT DETECTED Final   Cyclospora cayetanensis NOT DETECTED NOT DETECTED Final   Entamoeba histolytica NOT DETECTED NOT DETECTED Final   Giardia lamblia NOT DETECTED NOT DETECTED Final   Adenovirus F40/41 NOT DETECTED NOT DETECTED Final   Astrovirus NOT DETECTED NOT DETECTED Final   Norovirus GI/GII NOT DETECTED NOT DETECTED Final   Rotavirus A NOT DETECTED NOT DETECTED Final   Sapovirus (I, II, IV, and V) NOT DETECTED NOT DETECTED Final    Comment: Performed at [facility], [address] [place], [place] [address]        Radiology Studies: No results found.      Scheduled Meds: . apixaban  2.5 mg Oral BID  . buPROPion  150 mg Oral Daily  . carvedilol  25 mg Oral BID WC  . Chlorhexidine Gluconate Cloth  6 each Topical Daily  . diltiazem  120 mg Oral Daily  . diltiazem  180 mg Oral Daily  . escitalopram  10 mg Oral Daily  . ferrous sulfate  325 mg Oral Q breakfast  . insulin aspart  0-5 Units Subcutaneous QHS  . insulin aspart  0-9 Units Subcutaneous TID WC  . insulin aspart  2 Units Subcutaneous TID WC  . insulin glargine  5 Units Subcutaneous Daily  . pantoprazole  40 mg Oral Daily  . predniSONE  40 mg Oral Daily  . pregabalin  75 mg Oral BID  . vitamin B-12  1,000 mcg Oral Daily  . vitamin E  400 Units Oral BID   Continuous Infusions: . sodium chloride 10 mL/hr at 08/01/19 0600     LOS: 9  days     Cordelia Poche, MD [facility] 08/05/2019,  12:26 PM  If 7PM-7AM, please contact night-coverage [email or web address]

## 2019-08-06 DIAGNOSIS — E119 Type 2 diabetes mellitus without complications: Secondary | ICD-10-CM

## 2019-08-06 DIAGNOSIS — I10 Essential (primary) hypertension: Secondary | ICD-10-CM

## 2019-08-06 DIAGNOSIS — N183 Chronic kidney disease, stage 3 unspecified: Secondary | ICD-10-CM

## 2019-08-06 DIAGNOSIS — K668 Other specified disorders of peritoneum: Secondary | ICD-10-CM

## 2019-08-06 LAB — GLUCOSE, CAPILLARY
Glucose-Capillary: 178 mg/dL — ABNORMAL HIGH (ref 70–99)
Glucose-Capillary: 200 mg/dL — ABNORMAL HIGH (ref 70–99)

## 2019-08-06 LAB — SURGICAL PATHOLOGY

## 2019-08-06 LAB — SARS CORONAVIRUS 2 BY RT PCR (HOSPITAL ORDER, PERFORMED IN ~~LOC~~ HOSPITAL LAB): SARS Coronavirus 2: NEGATIVE

## 2019-08-06 MED ORDER — DILTIAZEM HCL ER COATED BEADS 180 MG PO CP24: 180.0000 mg | ORAL_CAPSULE | Freq: Every day | ORAL | Status: DC

## 2019-08-06 MED ORDER — APIXABAN 2.5 MG PO TABS: 2.5000 mg | ORAL_TABLET | Freq: Two times a day (BID) | ORAL | Status: DC

## 2019-08-06 MED ORDER — PREGABALIN 75 MG PO CAPS: 75.0000 mg | ORAL_CAPSULE | Freq: Two times a day (BID) | ORAL | 0 refills | Status: AC

## 2019-08-06 MED ORDER — INSULIN GLARGINE 100 UNIT/ML ~~LOC~~ SOLN: 5.0000 [IU] | Freq: Every day | SUBCUTANEOUS | Status: AC

## 2019-08-06 MED ORDER — CARVEDILOL 25 MG PO TABS: 25.0000 mg | ORAL_TABLET | Freq: Two times a day (BID) | ORAL | Status: AC

## 2019-08-06 MED ORDER — FERROUS SULFATE 325 (65 FE) MG PO TABS: 325.0000 mg | ORAL_TABLET | Freq: Every day | ORAL | Status: DC

## 2019-08-06 MED ORDER — PREDNISONE 10 MG PO TABS: ORAL_TABLET | ORAL | Status: AC

## 2019-08-06 NOTE — Progress Notes (Signed)
Report given to Levada Dy, Therapist, sports at [employer].

## 2019-08-06 NOTE — Discharge Summary (Signed)
Physician Discharge Summary  Deanna [name] UKG:[id number] DOB: 07/27/1930 DOA: 07/27/2019  PCP: Marton Redwood, MD  Admit date: 07/27/2019 Discharge date: 08/06/2019  Admitted From: [facility] Disposition: SNF  Recommendations for Outpatient Follow-up:  1. Follow up with PCP in 1 week 2. Follow up with medical oncology for abdominal mass 3. Follow up with GI, Dr. Earlean Shawl for colitis; patient is on a prolonged prednisone taper 4. Follow-up with atrial fibrillation clinic per cardiology 5. Consider repeat voiding trial since patient is off Myrbetriq vs follow-up with urology 6. Please obtain BMP/CBC in one week 7. Please follow up on the following pending results: None  Discharge Condition: Stable CODE STATUS: DNR Diet recommendation: Heart healthy   Brief/Interim Summary:  Admission HPI written by A Melven Sartorius., MD   Chief Complaint: diarrhea  HPI: Deanna [name] is a 86 y.o. female with medical history significant of T2DM, HTN, depression, breast cancer, inflammatory diarrhea who presents with worsening symptoms of diarrhea for the last 6 weeks.  She notes during this time she's had diarrhea, less than half of the days of the week.  She typically takes imodium which improves the diarrhea.  Described as watery diarrhea.  Yesterday, she started having severe lower abdominal pain, cramping, tender to touch and with movement.  Had nausea/vomiting yesterday as well.  She went to doctor, but was too weak to get out of car, BP in the 70's on EMS arrival.  She denies fevers, chills, flushing, nausea, shortness of breath.  Last week, people who lived in the same place as her had a stomach bug, but she wasn't around them.  No recent abx use.  No smoking.  Rare etoh.  Follows with Dr. Earlean Shawl outpatient.    Hospital course:  Acute colitis Initial concern for infection versus inflammatory process.  Patient with a history of inflammatory disease.  GI pathogen panel and C. difficile  testing have been negative.  Patient started on Solu-Medrol IV with improvement in symptoms.  Gastroenterology was consulted and performed colonoscopy on 6/24 which was concerning for Crohn's disease; biopsies obtained and significant for non-specific colitis. Gastroenterology recommended prednisone 40 mg daily x1 week, then taper down 5 mg per week until completed. Taper prescribed on discharge. Patient will need follow-up with her primary gastroenterologist, Dr. Earlean Shawl.  Sepsis Present on admission.  Secondary to acute colitis.  Physiology resolved. Blood cultures with no growth.  Atrial fibrillation with RVR New onset.  Echo obtained on 6/22 which was significant for normal EF and atria.  Patient started on diltiazem drip in addition to heparin drip.  Cardiology consulted on 6/23. Transitioned to Cardizem PO. Patient getting a total of Cardizem CD 360 mg daily; this does was adjusted back down to Cardizem CD 180 mg daily. Continue this dose on discharge in addition to Coreg 25 mg BID and Eliquis 2.5 mg BID (adjusted for age and weight). Cardiology to set up an appointment in their Atrial Fibrillation clinic.  Pulmonary edema Acute on chronic diastolic heart failure Patient was treated with IV Lasix with prompt improvement on repeat chest imaging.  CKD stage IIIa Baseline creatinine of 1.6.  Creatinine of 1.77 on presentation with improvement down to 1.04. Slight worsening secondary to overdiuresis which is now resolving.   Mass of the mid mesentery Seen on imaging.  Concern for possible neoplastic process.  Per chart review, discussed with oncology who recommends outpatient oncology work-up. Discussed with interventional radiology for consideration of biopsy who recommend DOTATATE PET as an  outpatient. Discussed with Dr. Marin Olp who will see the patient as an outpatient.  Iron deficiency anemia Continue iron supplementation  Diabetes mellitus, type II Hemoglobin A1c of 6.3%.  Lantus  started secondary to steroid initiation. Continue low dose Lantus while on steroids.  Peripheral neuropathy Patient is on Lyrica as an outpatient.  She is on on a 150 mg twice daily. This dose was reduced for renal function to Lyrica 75 mg BID.  Depression Continue Wellbutrin and Lexapro  Essential hypertension Antihypertensives initially held secondary to sepsis.  Blood pressure is currently controlled. Continue Coreg 25 mg twice daily and Cardizem as mentioned above. Discontinued amlodipine-valsartan on discharge.  Hypokalemia Supplementation given  Urinary retention Patient failed voiding trial x2.  Foley replaced on 6/22.  Unsure of etiology but patient is on Myrbetriq. Discontinue Myrbetriq -Outpatient Urology follow-up  Leukocytosis Significant jump correlating with initiation of IV solu-medrol. No evidence of infection otherwise. Trending down. Check periodically.   Discharge Diagnoses:  Active Problems:   Diabetes mellitus type II, non insulin dependent (HCC)   Colitis   Atrial fibrillation with RVR (HCC)   Acute respiratory failure with hypoxia (HCC)   Sepsis without acute organ dysfunction (HCC)   Diarrhea   Essential hypertension   CKD (chronic kidney disease), stage III   Calcified mesenteric mass    Discharge Instructions  Discharge Instructions    Diet - low sodium heart healthy   Complete by: As directed      Allergies as of 08/06/2019      Reactions   Codeine Nausea Only      Medication List    STOP taking these medications   amLODipine-valsartan 5-160 MG tablet Commonly known as: EXFORGE   cephALEXin 500 MG capsule Commonly known as: KEFLEX   Myrbetriq 50 MG Tb24 tablet Generic drug: mirabegron ER   predniSONE 5 MG (21) Tbpk tablet Commonly known as: STERAPRED UNI-PAK 21 TAB Replaced by: predniSONE 10 MG tablet   traMADol 50 MG tablet Commonly known as: ULTRAM     TAKE these medications   acetaminophen 325 MG tablet Commonly  known as: TYLENOL Take 650 mg by mouth 2 (two) times a day.   apixaban 2.5 MG Tabs tablet Commonly known as: ELIQUIS Take 1 tablet (2.5 mg total) by mouth 2 (two) times daily.   aspirin EC 81 MG tablet Take 81 mg by mouth at bedtime.   buPROPion 150 MG 24 hr tablet Commonly known as: WELLBUTRIN XL Take 150 mg by mouth daily.   carvedilol 25 MG tablet Commonly known as: COREG Take 1 tablet (25 mg total) by mouth 2 (two) times daily with a meal. What changed:   medication strength  how much to take   cyclobenzaprine 5 MG tablet Commonly known as: FLEXERIL Take 5 mg by mouth every 8 (eight) hours as needed for muscle spasms.   denosumab 60 MG/ML Soln injection Commonly known as: PROLIA Inject 60 mg into the skin every 6 (six) months. Administer in upper arm, thigh, or abdomen   diltiazem 180 MG 24 hr capsule Commonly known as: CARDIZEM CD Take 1 capsule (180 mg total) by mouth daily. Start taking on: August 07, 2019   escitalopram 10 MG tablet Commonly known as: LEXAPRO Take 10 mg by mouth daily.   esomeprazole 40 MG capsule Commonly known as: NEXIUM Take 40 mg by mouth daily at 12 noon.   feeding supplement (ENSURE ENLIVE) Liqd Take 237 mLs by mouth 3 (three) times daily between meals.   ferrous  sulfate 325 (65 FE) MG tablet Take 1 tablet (325 mg total) by mouth daily with breakfast. Start taking on: August 07, 2019   hydrochlorothiazide 12.5 MG capsule Commonly known as: MICROZIDE Take 12.5 mg by mouth daily.   insulin glargine 100 UNIT/ML injection Commonly known as: LANTUS Inject 0.05 mLs (5 Units total) into the skin daily. Start taking on: August 07, 2019   loperamide 2 MG tablet Commonly known as: IMODIUM A-D Take 2 mg by mouth 4 (four) times daily as needed for diarrhea or loose stools.   mesalamine 1.2 g EC tablet Commonly known as: LIALDA Take 2.4 g by mouth daily with breakfast.   metFORMIN 500 MG tablet Commonly known as: GLUCOPHAGE Take 500 mg  by mouth daily with breakfast.   predniSONE 10 MG tablet Commonly known as: DELTASONE Take 4 tablets (40 mg total) by mouth daily for 3 days, THEN 3.5 tablets (35 mg total) daily for 7 days, THEN 3 tablets (30 mg total) daily for 7 days, THEN 2.5 tablets (25 mg total) daily for 7 days, THEN 2 tablets (20 mg total) daily for 7 days, THEN 1.5 tablets (15 mg total) daily for 7 days, THEN 1 tablet (10 mg total) daily for 7 days, THEN 0.5 tablets (5 mg total) daily for 7 days. Total taper ENDING: 09/27/2019. Start taking on: August 07, 2019 Replaces: predniSONE 5 MG (21) Tbpk tablet   pregabalin 75 MG capsule Commonly known as: LYRICA Take 1 capsule (75 mg total) by mouth 2 (two) times daily. What changed:   medication strength  how much to take   PROBIOTIC-10 PO Take 1 capsule by mouth daily.   rosuvastatin 10 MG tablet Commonly known as: CRESTOR Take 10 mg by mouth at bedtime.   Symax-SR 0.375 MG 12 hr tablet Generic drug: hyoscyamine Take 0.375 mg by mouth every 12 (twelve) hours as needed for pain.   VITAMIN B 12 PO Take 1,000 mcg by mouth daily.   VITAMIN D (CHOLECALCIFEROL) PO Take 400 Units by mouth daily.   vitamin E 180 MG (400 UNITS) capsule Take 400 Units by mouth 2 (two) times a day.   Voltaren 1 % Gel Generic drug: diclofenac Sodium Apply 2 g topically 4 (four) times daily as needed (painful joints).       Follow-up Information    Fenton, Clint R, PA Follow up on 08/17/2019.   Specialty: Cardiology Why: Please arrive 15 minutes early for your 9am post-hospital cardiology appointment. The parking code is [address] information: [place] [address] [phone]        Richmond Campbell, MD. Schedule an appointment as soon as possible for a visit in 1 week(s).   Specialty: Gastroenterology Why: Colitis Contact information: [place] [address] [phone]        Volanda Napoleon, MD. Schedule an appointment as soon as  possible for a visit.   Specialty: Oncology Why: Abdominal mass. Contact information: [address] [place] [address] [phone]        Irine Seal, MD Follow up.   Specialty: Urology Why: Urinary retension Contact information: [address] [place]  [address] [phone]              Allergies  Allergen Reactions  . Codeine Nausea Only    Consultations:  Gastroenterology  Cardiology   Procedures/Studies: CT ABDOMEN PELVIS WO CONTRAST  Result Date: 07/27/2019 CLINICAL DATA:  Six weeks of abdominal pain with  vomiting and diarrhea. Weakness and possible fever. EXAM: CT ABDOMEN AND PELVIS WITHOUT CONTRAST TECHNIQUE: Multidetector CT imaging of the abdomen and pelvis was performed following the standard protocol without IV contrast. COMPARISON:  None. FINDINGS: Lower chest: Small amount right pleural fluid with mild bibasilar atelectatic change. Posterior bibasilar bronchiectatic change. Minimal calcified plaque over the descending thoracic aorta. Possible small hiatal hernia. Hepatobiliary: Minimal cholelithiasis. Liver and biliary tree are normal. Pancreas: Normal. Spleen: Normal. Adrenals/Urinary Tract: Adrenal glands are normal. Kidneys are normal in size without hydronephrosis or nephrolithiasis. Ureters are unremarkable. Bladder is somewhat distended. Stomach/Bowel: Possible small hiatal hernia. Small bowel is unremarkable. Previous appendectomy. Fluid-filled colon with possible mild wall thickening of the distal descending and sigmoid colon which may be seen with mild acute colitis. Vascular/Lymphatic: Moderate calcified plaque over the abdominal aorta which is normal in caliber. Few small mesenteric lymph nodes as described below. Reproductive: Uterus just left of midline and otherwise unremarkable. Adnexal regions unremarkable. Other: There is a masslike soft tissue density with associated coarse calcification over the mid mesentery measuring  approximately 3.1 x 4 cm. There are a few small adjacent mesenteric lymph nodes. Small amount of free fluid over the cul-de-sac. Musculoskeletal: No focal abnormality. Degenerative changes of the spine with disc disease at the L4-5 and L5-S1 levels. Significant disc disease at the T11-12 level. Possible subtle grade 1 anterolisthesis of L4 on L5 and of L5 on S1. IMPRESSION: 1. Fluid-filled colon with mild wall thickening over the distal descending and sigmoid colon which may be due to an acute colitis of infectious or inflammatory nature. Small amount of associated free fluid in the pelvis. No obstruction. 2. Mass over the mid mesentery with associated coarse calcification measuring 3.1 x 4 cm. Few small associated mesenteric lymph nodes. Findings may be due to a neoplastic process such as carcinoid tumor. 3.  Mild cholelithiasis. 4. Small right pleural effusion with bibasilar atelectasis as well as bibasilar bronchiectatic change. 5.  Aortic Atherosclerosis (ICD10-I70.0). Electronically Signed   By: Marin Olp M.D.   On: 07/27/2019 17:15   DG CHEST PORT 1 VIEW  Result Date: 07/31/2019 CLINICAL DATA:  Shortness of breath.  History of breast cancer. EXAM: PORTABLE CHEST 1 VIEW COMPARISON:  07/31/2019 FINDINGS: Lower cervical plate and screw fixator. Left axillary clips noted along with left mastectomy. Atherosclerotic calcification of the aortic arch. The lungs appear clear. Levoconvex thoracolumbar scoliosis which may be positional. Heart size within normal limits. The patient is rotated to the right on today's radiograph, reducing diagnostic sensitivity and specificity. IMPRESSION: 1. No acute findings. 2.  Aortic Atherosclerosis (ICD10-I70.0). Electronically Signed   By: Van Clines M.D.   On: 07/31/2019 19:01   DG CHEST PORT 1 VIEW  Result Date: 07/31/2019 CLINICAL DATA:  Hypoxia. EXAM: PORTABLE CHEST 1 VIEW COMPARISON:  07/30/2019 FINDINGS: Normal heart size. No pleural effusion. Interval  improvement in interstitial edema. No airspace consolidation identified. Previous ACDF within the lower cervical spine. IMPRESSION: Interval improvement in interstitial edema. Electronically Signed   By: Kerby Moors M.D.   On: 07/31/2019 09:15   DG CHEST PORT 1 VIEW  Result Date: 07/30/2019 CLINICAL DATA:  Acute respiratory failure with hypoxia. EXAM: PORTABLE CHEST 1 VIEW COMPARISON:  Radiograph 3 days ago 07/27/2019. FINDINGS: Lower lung volumes from prior exam. There are development of bilateral infrahilar opacities since prior exam. Possible septal thickening/Kerley B-lines peripherally. Possible small right pleural effusion. Unchanged heart size and mediastinal contours. Aortic atherosclerosis. No pneumothorax. IMPRESSION: 1. Development of bilateral infrahilar  opacities which may represent pulmonary edema or pneumonia, including aspiration. Presence of septal thickening favors pulmonary edema. 2. Possible small right pleural effusion. Electronically Signed   By: Keith Rake M.D.   On: 07/30/2019 03:36   DG Chest Port 1 View  Result Date: 07/27/2019 CLINICAL DATA:  Hypotension. EXAM: PORTABLE CHEST 1 VIEW COMPARISON:  06/27/2018. FINDINGS: Mediastinum and hilar structures normal. Heart size normal. No focal infiltrate. No pleural effusion or pneumothorax. Surgical clips left axilla. Prior cervical spine fusion. Degenerative change and scoliosis thoracic spine. Surgical clips left axilla. IMPRESSION: No acute cardiopulmonary disease. Electronically Signed   By: Marcello Moores  Register   On: 07/27/2019 14:37   ECHOCARDIOGRAM COMPLETE  Result Date: 07/31/2019    ECHOCARDIOGRAM REPORT   Patient Name:   AISHWARYA SHIPLETT Date of Exam: 07/31/2019 Medical Rec #:  [id number]         Height:       62.0 in Accession #:    [id number]        Weight:       104.6 lb Date of Birth:  1930/06/10         BSA:          1.451 m Patient Age:    21 years          BP:           128/78 mmHg Patient Gender: F                  HR:           103 bpm. Exam Location:  Inpatient Procedure: 2D Echo, Cardiac Doppler and Color Doppler Indications:    Atrial fibrillation  History:        Patient has prior history of Echocardiogram examinations, most                 recent 09/29/2016. Arrythmias:Atrial Fibrillation; Risk                 Factors:Diabetes and Hypertension. Sepsis, Breast cancer.  Sonographer:    Dustin Flock Referring Phys: [phone] A CALDWELL Clawson  1. Left ventricular ejection fraction, by estimation, is 60 to 65%. The left ventricle has normal function. The left ventricle has no regional wall motion abnormalities. There is moderate concentric left ventricular hypertrophy. Left ventricular diastolic parameters are indeterminate. Elevated left ventricular end-diastolic pressure.  2. Right ventricular systolic function is normal. The right ventricular size is normal. There is mildly elevated pulmonary artery systolic pressure. The estimated right ventricular systolic pressure is 91.6 mmHg.  3. The mitral valve is normal in structure. Mild mitral valve regurgitation. No evidence of mitral stenosis.  4. Tricuspid valve regurgitation is moderate.  5. The aortic valve is tricuspid. Aortic valve regurgitation is not visualized. Mild to moderate aortic valve sclerosis/calcification is present, without any evidence of aortic stenosis.  6. The inferior vena cava is normal in size with greater than 50% respiratory variability, suggesting right atrial pressure of 3 mmHg. FINDINGS  Left Ventricle: Left ventricular ejection fraction, by estimation, is 60 to 65%. The left ventricle has normal function. The left ventricle has no regional wall motion abnormalities. The left ventricular internal cavity size was normal in size. There is  moderate concentric left ventricular hypertrophy. Left ventricular diastolic parameters are indeterminate. Elevated left ventricular end-diastolic pressure. Right Ventricle: The right ventricular  size is normal. No increase in right ventricular wall thickness. Right ventricular systolic function is normal. There is mildly elevated pulmonary artery  systolic pressure. The tricuspid regurgitant velocity is 3.08  m/s, and with an assumed right atrial pressure of 3 mmHg, the estimated right ventricular systolic pressure is 79.3 mmHg. Left Atrium: Left atrial size was normal in size. Right Atrium: Right atrial size was normal in size. Pericardium: There is no evidence of pericardial effusion. Mitral Valve: The mitral valve is normal in structure. There is mild calcification of the mitral valve leaflet(s). Normal mobility of the mitral valve leaflets. Mild to moderate mitral annular calcification. Mild mitral valve regurgitation. No evidence of mitral valve stenosis. Tricuspid Valve: The tricuspid valve is normal in structure. Tricuspid valve regurgitation is moderate . No evidence of tricuspid stenosis. Aortic Valve: The aortic valve is tricuspid. Aortic valve regurgitation is not visualized. Mild to moderate aortic valve sclerosis/calcification is present, without any evidence of aortic stenosis. Pulmonic Valve: The pulmonic valve was normal in structure. Pulmonic valve regurgitation is not visualized. No evidence of pulmonic stenosis. Aorta: The aortic root is normal in size and structure. Venous: The inferior vena cava is normal in size with greater than 50% respiratory variability, suggesting right atrial pressure of 3 mmHg. IAS/Shunts: No atrial level shunt detected by color flow Doppler.  LEFT VENTRICLE PLAX 2D LVIDd:         2.70 cm  Diastology LVIDs:         1.90 cm  LV e' lateral:   4.64 cm/s LV PW:         1.30 cm  LV E/e' lateral: 18.2 LV IVS:        1.30 cm  LV e' medial:    3.77 cm/s LVOT diam:     1.70 cm  LV E/e' medial:  22.4 LV SV:         41 LV SV Index:   28 LVOT Area:     2.27 cm  RIGHT VENTRICLE RV Basal diam:  2.90 cm RV S prime:     10.60 cm/s TAPSE (M-mode): 1.9 cm LEFT ATRIUM              Index       RIGHT ATRIUM           Index LA diam:        4.10 cm 2.82 cm/m  RA Area:     12.40 cm LA Vol (A2C):   34.1 ml 23.49 ml/m RA Volume:   29.40 ml  20.26 ml/m LA Vol (A4C):   60.4 ml 41.62 ml/m LA Biplane Vol: 49.6 ml 34.17 ml/m  AORTIC VALVE LVOT Vmax:   95.30 cm/s LVOT Vmean:  71.600 cm/s LVOT VTI:    0.180 m  AORTA Ao Root diam: 2.50 cm MITRAL VALVE               TRICUSPID VALVE MV Area (PHT): 5.54 cm    TR Peak grad:   37.9 mmHg MV Decel Time: 137 msec    TR Vmax:        308.00 cm/s MV E velocity: 84.40 cm/s                            SHUNTS                            Systemic VTI:  0.18 m  Systemic Diam: 1.70 cm Fransico Him MD Electronically signed by Fransico Him MD Signature Date/Time: 07/31/2019/1:36:53 PM    Final    OCT, Retina - OU - Both Eyes  Result Date: 07/26/2019 Right Eye Quality was good. Scan locations included subfoveal. Central Foveal Thickness: 289. Progression has been stable. Findings include vitreomacular adhesion . Left Eye Quality was good. Scan locations included subfoveal. Central Foveal Thickness: 301. Progression has been stable. Findings include vitreomacular adhesion . Notes OU with minor vitreal macular adhesion.  No inner foveal distortion from the attachment sites in either eye.  However the inner foveal of the retina has micro-CME.  This is morphologically slightly different as compared to the visit December 2020.  This does suggest that this might be ongoing minor MAC-TEL.    TRANSTHORACIC ECHOCARDIOGRAM (08/01/2019) IMPRESSIONS    1. Left ventricular ejection fraction, by estimation, is 60 to 65%. The  left ventricle has normal function. The left ventricle has no regional  wall motion abnormalities. There is moderate concentric left ventricular  hypertrophy. Left ventricular  diastolic parameters are indeterminate. Elevated left ventricular  end-diastolic pressure.  2. Right ventricular systolic function is normal. The  right ventricular  size is normal. There is mildly elevated pulmonary artery systolic  pressure. The estimated right ventricular systolic pressure is 56.4 mmHg.  3. The mitral valve is normal in structure. Mild mitral valve  regurgitation. No evidence of mitral stenosis.  4. Tricuspid valve regurgitation is moderate.  5. The aortic valve is tricuspid. Aortic valve regurgitation is not  visualized. Mild to moderate aortic valve sclerosis/calcification is  present, without any evidence of aortic stenosis.  6. The inferior vena cava is normal in size with greater than 50%  respiratory variability, suggesting right atrial pressure of 3 mmHg.    Subjective: No issues today.  Discharge Exam: Vitals:   08/06/19 0935 08/06/19 1325  BP: 123/67 117/66  Pulse:  73  Resp:  17  Temp:  98 F (36.7 C)  SpO2:  96%   Vitals:   08/05/19 2044 08/06/19 0530 08/06/19 0935 08/06/19 1325  BP: 122/62 127/61 123/67 117/66  Pulse: 65 60  73  Resp: _0 Temp: 98.2 F (36.8 C) 97.8 F (36.6 C)  98 F (36.7 C)  TempSrc: Oral Oral  Oral  SpO2: 96% 97%  96%  Weight:  48.2 kg    Height:        General: Pt is alert, awake, not in acute distress Cardiovascular: Irregular rhythm with normal rate. S1/S2 +, no rubs, no gallops Respiratory: CTA bilaterally, no wheezing, no rhonchi Abdominal: Soft, NT, ND, bowel sounds + Extremities: no edema, no cyanosis    The results of significant diagnostics from this hospitalization (including imaging, microbiology, ancillary and laboratory) are listed below for reference.     Microbiology: Recent Results (from the past 240 hour(s))  SARS Coronavirus 2 by RT PCR (hospital order, performed in [facility] hospital lab) Nasopharyngeal Nasopharyngeal Swab     Status: None   Collection Time: 07/27/19  4:21 PM   Specimen: Nasopharyngeal Swab  Result Value Ref Range Status   SARS Coronavirus 2 NEGATIVE NEGATIVE Final    Comment: (NOTE) SARS-CoV-2 target  nucleic acids are NOT DETECTED.  The SARS-CoV-2 RNA is generally detectable in upper and lower respiratory specimens during the acute phase of infection. The lowest concentration of SARS-CoV-2 viral copies this assay can detect is 250 copies / mL. A negative result does not preclude SARS-CoV-2 infection and  should not be used as the sole basis for treatment or other patient management decisions.  A negative result may occur with improper specimen collection / handling, submission of specimen other than nasopharyngeal swab, presence of viral mutation(s) within the areas targeted by this assay, and inadequate number of viral copies (<250 copies / mL). A negative result must be combined with clinical observations, patient history, and epidemiological information.  Fact Sheet for Patients:   StrictlyIdeas.no  Fact Sheet for Healthcare Providers: BankingDealers.co.za  This test is not yet approved or  cleared by the [place] FDA and has been authorized for detection and/or diagnosis of SARS-CoV-2 by FDA under an Emergency Use Authorization (EUA).  This EUA will remain in effect (meaning this test can be used) for the duration of the COVID-19 declaration under Section 564(b)(1) of the Act, 21 U.S.C. section 360bbb-3(b)(1), unless the authorization is terminated or revoked sooner.  Performed at [facility], [place] [address] [place], [place] [address]   C Difficile Quick Screen w PCR reflex     Status: None   Collection Time: 07/27/19  5:38 PM   Specimen: STOOL  Result Value Ref Range Status   C Diff antigen NEGATIVE NEGATIVE Final   C Diff toxin NEGATIVE NEGATIVE Final   C Diff interpretation No C. difficile detected.  Final    Comment: VALID Performed at [facility], [place] [address] [place], [place] [address]   Gastrointestinal Panel by PCR , Stool     Status: None   Collection Time: 07/27/19   5:38 PM   Specimen: STOOL  Result Value Ref Range Status   Campylobacter species NOT DETECTED NOT DETECTED Final   Plesimonas shigelloides NOT DETECTED NOT DETECTED Final   Salmonella species NOT DETECTED NOT DETECTED Final   Yersinia enterocolitica NOT DETECTED NOT DETECTED Final   Vibrio species NOT DETECTED NOT DETECTED Final   Vibrio cholerae NOT DETECTED NOT DETECTED Final   Enteroaggregative E coli (EAEC) NOT DETECTED NOT DETECTED Final   Enteropathogenic E coli (EPEC) NOT DETECTED NOT DETECTED Final   Enterotoxigenic E coli (ETEC) NOT DETECTED NOT DETECTED Final   Shiga like toxin producing E coli (STEC) NOT DETECTED NOT DETECTED Final   Shigella/Enteroinvasive E coli (EIEC) NOT DETECTED NOT DETECTED Final   Cryptosporidium NOT DETECTED NOT DETECTED Final   Cyclospora cayetanensis NOT DETECTED NOT DETECTED Final   Entamoeba histolytica NOT DETECTED NOT DETECTED Final   Giardia lamblia NOT DETECTED NOT DETECTED Final   Adenovirus F40/41 NOT DETECTED NOT DETECTED Final   Astrovirus NOT DETECTED NOT DETECTED Final   Norovirus GI/GII NOT DETECTED NOT DETECTED Final   Rotavirus A NOT DETECTED NOT DETECTED Final   Sapovirus (I, II, IV, and V) NOT DETECTED NOT DETECTED Final    Comment: Performed at [facility], [place] [address] [place], [place] [address]  SARS Coronavirus 2 by RT PCR (hospital order, performed in [place] hospital lab) Nasopharyngeal Nasopharyngeal Swab     Status: None   Collection Time: 08/06/19  9:57 AM   Specimen: Nasopharyngeal Swab  Result Value Ref Range Status   SARS Coronavirus 2 NEGATIVE NEGATIVE Final    Comment: (NOTE) SARS-CoV-2 target nucleic acids are NOT DETECTED.  The SARS-CoV-2 RNA is generally detectable in upper and lower respiratory specimens during the acute phase of infection. The lowest concentration of SARS-CoV-2 viral copies this assay can detect is 250 copies / mL. A negative result does not preclude SARS-CoV-2  infection and should  not be used as the sole basis for treatment or other patient management decisions.  A negative result may occur with improper specimen collection / handling, submission of specimen other than nasopharyngeal swab, presence of viral mutation(s) within the areas targeted by this assay, and inadequate number of viral copies (<250 copies / mL). A negative result must be combined with clinical observations, patient history, and epidemiological information.  Fact Sheet for Patients:   StrictlyIdeas.no  Fact Sheet for Healthcare Providers: BankingDealers.co.za  This test is not yet approved or  cleared by the [place] FDA and has been authorized for detection and/or diagnosis of SARS-CoV-2 by FDA under an Emergency Use Authorization (EUA).  This EUA will remain in effect (meaning this test can be used) for the duration of the COVID-19 declaration under Section 564(b)(1) of the Act, 21 U.S.C. section 360bbb-3(b)(1), unless the authorization is terminated or revoked sooner.  Performed at [facility], [place] [address], [place], [place] [address]      Labs: BNP (last 3 results) Recent Labs    07/30/19 1604 07/31/19 0746  BNP 1,034.6* 720.9*   Basic Metabolic Panel: Recent Labs  Lab 07/31/19 0746 08/01/19 0425 08/02/19 0549 08/03/19 0530  NA 141 140 141 139  K 3.1* 3.1* 2.9* 4.3  CL 102 103 109 111  CO2 23 25 19* 19*  GLUCOSE 197* 197* 166* 153*  BUN 30* 41* 27* 30*  CREATININE 1.45* 1.70* 1.39* 1.21*  CALCIUM 9.5 9.2 9.3 9.4  MG 1.7 1.5* 1.7  --   PHOS 2.8 2.9  --   --    Liver Function Tests: Recent Labs  Lab 07/31/19 0746 08/01/19 0425  AST 14* 13*  ALT 16 16  ALKPHOS 61 55  BILITOT 1.0 0.7  PROT 7.4 6.8  ALBUMIN 3.3* 3.3*   No results for input(s): LIPASE, AMYLASE in the last 168 hours. No results for input(s): AMMONIA in the last 168 hours. CBC: Recent Labs  Lab  07/31/19 0746 08/01/19 0425 08/02/19 0549 08/03/19 0530  WBC 15.3* 18.9* 19.2* 16.0*  HGB 12.1 12.1 12.7 11.6*  HCT 36.3 36.6 39.2 34.8*  MCV 83.8 84.1 86.2 86.1  PLT 194 215 247 240   Cardiac Enzymes: No results for input(s): CKTOTAL, CKMB, CKMBINDEX, TROPONINI in the last 168 hours. BNP: Invalid input(s): POCBNP CBG: Recent Labs  Lab 08/05/19 1133 08/05/19 1625 08/05/19 2045 08/06/19 0752 08/06/19 1219  GLUCAP 245* 359* 210* 178* 200*   D-Dimer No results for input(s): DDIMER in the last 72 hours. Hgb A1c No results for input(s): HGBA1C in the last 72 hours. Lipid Profile No results for input(s): CHOL, HDL, LDLCALC, TRIG, CHOLHDL, LDLDIRECT in the last 72 hours. Thyroid function studies No results for input(s): TSH, T4TOTAL, T3FREE, THYROIDAB in the last 72 hours.  Invalid input(s): FREET3 Anemia work up No results for input(s): VITAMINB12, FOLATE, FERRITIN, TIBC, IRON, RETICCTPCT in the last 72 hours. Urinalysis    Component Value Date/Time   COLORURINE YELLOW 07/27/2019 0145   APPEARANCEUR CLEAR 07/27/2019 0145   LABSPEC 1.013 07/27/2019 0145   PHURINE 5.0 07/27/2019 0145   GLUCOSEU NEGATIVE 07/27/2019 0145   HGBUR NEGATIVE 07/27/2019 0145   BILIRUBINUR NEGATIVE 07/27/2019 0145   KETONESUR NEGATIVE 07/27/2019 0145   PROTEINUR NEGATIVE 07/27/2019 0145   UROBILINOGEN 0.2 08/07/2006 1818   NITRITE NEGATIVE 07/27/2019 0145   LEUKOCYTESUR LARGE (A) 07/27/2019 0145   Sepsis Labs Invalid input(s): PROCALCITONIN,  WBC,  LACTICIDVEN Microbiology Recent Results (from the past 240 hour(s))  SARS Coronavirus 2 by RT PCR (hospital order, performed in [facility] hospital lab) Nasopharyngeal Nasopharyngeal Swab     Status: None   Collection Time: 07/27/19  4:21 PM   Specimen: Nasopharyngeal Swab  Result Value Ref Range Status   SARS Coronavirus 2 NEGATIVE NEGATIVE Final    Comment: (NOTE) SARS-CoV-2 target nucleic acids are NOT DETECTED.  The SARS-CoV-2 RNA is  generally detectable in upper and lower respiratory specimens during the acute phase of infection. The lowest concentration of SARS-CoV-2 viral copies this assay can detect is 250 copies / mL. A negative result does not preclude SARS-CoV-2 infection and should not be used as the sole basis for treatment or other patient management decisions.  A negative result may occur with improper specimen collection / handling, submission of specimen other than nasopharyngeal swab, presence of viral mutation(s) within the areas targeted by this assay, and inadequate number of viral copies (<250 copies / mL). A negative result must be combined with clinical observations, patient history, and epidemiological information.  Fact Sheet for Patients:   StrictlyIdeas.no  Fact Sheet for Healthcare Providers: BankingDealers.co.za  This test is not yet approved or  cleared by the [place] FDA and has been authorized for detection and/or diagnosis of SARS-CoV-2 by FDA under an Emergency Use Authorization (EUA).  This EUA will remain in effect (meaning this test can be used) for the duration of the COVID-19 declaration under Section 564(b)(1) of the Act, 21 U.S.C. section 360bbb-3(b)(1), unless the authorization is terminated or revoked sooner.  Performed at [facility], [place] [address] [place], [place] [address]   C Difficile Quick Screen w PCR reflex     Status: None   Collection Time: 07/27/19  5:38 PM   Specimen: STOOL  Result Value Ref Range Status   C Diff antigen NEGATIVE NEGATIVE Final   C Diff toxin NEGATIVE NEGATIVE Final   C Diff interpretation No C. difficile detected.  Final    Comment: VALID Performed at [facility], [place] [address], [place], [place] [address]   Gastrointestinal Panel by PCR , Stool     Status: None   Collection Time: 07/27/19  5:38 PM   Specimen: STOOL  Result Value Ref Range  Status   Campylobacter species NOT DETECTED NOT DETECTED Final   Plesimonas shigelloides NOT DETECTED NOT DETECTED Final   Salmonella species NOT DETECTED NOT DETECTED Final   Yersinia enterocolitica NOT DETECTED NOT DETECTED Final   Vibrio species NOT DETECTED NOT DETECTED Final   Vibrio cholerae NOT DETECTED NOT DETECTED Final   Enteroaggregative E coli (EAEC) NOT DETECTED NOT DETECTED Final   Enteropathogenic E coli (EPEC) NOT DETECTED NOT DETECTED Final   Enterotoxigenic E coli (ETEC) NOT DETECTED NOT DETECTED Final   Shiga like toxin producing E coli (STEC) NOT DETECTED NOT DETECTED Final   Shigella/Enteroinvasive E coli (EIEC) NOT DETECTED NOT DETECTED Final   Cryptosporidium NOT DETECTED NOT DETECTED Final   Cyclospora cayetanensis NOT DETECTED NOT DETECTED Final   Entamoeba histolytica NOT DETECTED NOT DETECTED Final   Giardia lamblia NOT DETECTED NOT DETECTED Final   Adenovirus F40/41 NOT DETECTED NOT DETECTED Final   Astrovirus NOT DETECTED NOT DETECTED Final   Norovirus GI/GII NOT DETECTED NOT DETECTED Final   Rotavirus A NOT DETECTED NOT DETECTED Final   Sapovirus (I, II, IV, and V) NOT DETECTED NOT DETECTED Final    Comment: Performed at [facility], [address] [place], [place] [address]  SARS  Coronavirus 2 by RT PCR (hospital order, performed in [facility] hospital lab) Nasopharyngeal Nasopharyngeal Swab     Status: None   Collection Time: 08/06/19  9:57 AM   Specimen: Nasopharyngeal Swab  Result Value Ref Range Status   SARS Coronavirus 2 NEGATIVE NEGATIVE Final    Comment: (NOTE) SARS-CoV-2 target nucleic acids are NOT DETECTED.  The SARS-CoV-2 RNA is generally detectable in upper and lower respiratory specimens during the acute phase of infection. The lowest concentration of SARS-CoV-2 viral copies this assay can detect is 250 copies / mL. A negative result does not preclude SARS-CoV-2 infection and should not be used as the sole basis for treatment  or other patient management decisions.  A negative result may occur with improper specimen collection / handling, submission of specimen other than nasopharyngeal swab, presence of viral mutation(s) within the areas targeted by this assay, and inadequate number of viral copies (<250 copies / mL). A negative result must be combined with clinical observations, patient history, and epidemiological information.  Fact Sheet for Patients:   StrictlyIdeas.no  Fact Sheet for Healthcare Providers: BankingDealers.co.za  This test is not yet approved or  cleared by the [place] FDA and has been authorized for detection and/or diagnosis of SARS-CoV-2 by FDA under an Emergency Use Authorization (EUA).  This EUA will remain in effect (meaning this test can be used) for the duration of the COVID-19 declaration under Section 564(b)(1) of the Act, 21 U.S.C. section 360bbb-3(b)(1), unless the authorization is terminated or revoked sooner.  Performed at [facility], [place] [address], [place], [place] [address]      Time coordinating discharge: 35 minutes  SIGNED:   Cordelia Poche, MD [facility] 08/06/2019, 2:00 PM

## 2019-08-06 NOTE — Progress Notes (Signed)
Physical Therapy Treatment Patient Details Name: Deanna [name] MRN: [id number] DOB: 1930/04/20 Today's Date: 08/06/2019    History of Present Illness 86 y.o. female with medical history significant of T2DM, HTN, depression, breast cancer, inflammatory diarrhea who presents with worsening symptoms of diarrhea for the last 6 weeks.  Pt admitted for Sepsis 2/2 Acute Colitis  Diarrhea  History of Inflammatory Diarrhea    PT Comments    Pt with improving ambulation tolerance and HR in therapeutic range. Cont to require cues for RW maneuvering to maintain close to body and within RW frame. Pt with improving strength, able to get into/out of bed without physical assistance. Patient will benefit from continued physical therapy in hospital and recommendations below to increase strength, balance, endurance for safe ADLs and gait.   Follow Up Recommendations  SNF;Supervision for mobility/OOB     Equipment Recommendations  None recommended by PT    Recommendations for Other Services       Precautions / Restrictions Precautions Precautions: Fall Precaution Comments: monitor HR Restrictions Weight Bearing Restrictions: No    Mobility  Bed Mobility Overal bed mobility: Needs Assistance Bed Mobility: Supine to Sit;Sit to Supine     Supine to sit: Supervision;HOB elevated Sit to supine: Min guard   General bed mobility comments: min G with bed mobility, able to mobilize BLE without physical assist back into bed  Transfers Overall transfer level: Needs assistance Equipment used: Rolling walker (2 wheeled) Transfers: Sit to/from Stand Sit to Stand: Min guard  General transfer comment: BUE assisting to power up, initially unsteady but improve with time  Ambulation/Gait Ambulation/Gait assistance: Min guard Gait Distance (Feet): 78 Feet Assistive device: Rolling walker (2 wheeled) Gait Pattern/deviations: Step-through pattern;Decreased stride length;Narrow base of support Gait  velocity: decreased   General Gait Details: short, narrow steps with RW, good steadiness, slightly flexed trunk on RW, cues to maintain body wtihin RW frame with turns, HR up to 102bpm, denies pain, dizziness, lightheadedness, SOB   Stairs             Wheelchair Mobility    Modified Rankin (Stroke Patients Only)       Balance Overall balance assessment: Needs assistance Sitting-balance support: Feet supported;Bilateral upper extremity supported Sitting balance-Leahy Scale: Good Sitting balance - Comments: seated EOB   Standing balance support: During functional activity;Bilateral upper extremity supported Standing balance-Leahy Scale: Fair Standing balance comment: static able to release RW to don/doff face mask, reliant on RW for dynamic mobility       Cognition Arousal/Alertness: Awake/alert Behavior During Therapy: WFL for tasks assessed/performed Overall Cognitive Status: Within Functional Limits for tasks assessed         Exercises      General Comments General comments (skin integrity, edema, etc.): HR up to 102 bpm with amulation      Pertinent Vitals/Pain Pain Assessment: No/denies pain    Home Living               Prior Function            PT Goals (current goals can now be found in the care plan section) Acute Rehab PT Goals PT Goal Formulation: With patient Time For Goal Achievement: 08/13/19 Potential to Achieve Goals: Good Progress towards PT goals: Progressing toward goals    Frequency    Min 3X/week      PT Plan Current plan remains appropriate    Co-evaluation              AM-PAC PT "  6 Clicks" Mobility   Outcome Measure  Help needed turning from your back to your side while in a flat bed without using bedrails?: None Help needed moving from lying on your back to sitting on the side of a flat bed without using bedrails?: None Help needed moving to and from a bed to a chair (including a wheelchair)?: A Little Help  needed standing up from a chair using your arms (e.g., wheelchair or bedside chair)?: A Little Help needed to walk in hospital room?: A Little Help needed climbing 3-5 steps with a railing? : A Little 6 Click Score: 20    End of Session Equipment Utilized During Treatment: Gait belt Activity Tolerance: Patient tolerated treatment well Patient left: in bed;with call bell/phone within reach;with bed alarm set Nurse Communication: Mobility status PT Visit Diagnosis: Other abnormalities of gait and mobility (R26.89)     Time: 3343-5686 PT Time Calculation (min) (ACUTE ONLY): 13 min  Charges:  $Gait Training: 8-22 mins                      Tori Yitzchak Kothari PT, DPT 08/06/19, 11:45 AM

## 2019-08-06 NOTE — TOC Progression Note (Signed)
Transition of Care [facility]) - Progression Note    Patient Details  Name: Deanna [name] MRN: [id number] Date of Birth: 1930-10-20  [facility]) CM/SW Contact  Joaquin Courts, RN Phone Number: 08/06/2019, 2:45 PM  Clinical Narrative:    CM confirmed bed availability at [facility].  DC summary/ FL2 faxed to facility.  Patient will discharge to room 430.  Patient's daughter plans to transport.   Expected Discharge Plan: [place] Barriers to Discharge: No Barriers Identified  Expected Discharge Plan and Services Expected Discharge Plan: [place]   Discharge Planning Services: CM Consult   Living arrangements for the past 2 months: [place] Expected Discharge Date: 08/06/19                         HH Arranged: PT HH Agency: Other - See comment ([place] has own HHPT) Date HH Agency Contacted: 07/31/19 Time [place]: 1256 Representative spoke with at [place]: [place] ([place]) Interventions    Readmission Risk Interventions No flowsheet data found.

## 2019-08-06 NOTE — TOC Progression Note (Signed)
Transition of Care [facility]) - Progression Note    Patient Details  Name: Deanna [name] MRN: [id number] Date of Birth: 1930-09-18  [facility]) CM/SW Contact  Joaquin Courts, RN Phone Number: 08/06/2019, 10:08 AM  Clinical Narrative:  Authorization for SNF approved.  Auth ID # [id number], auth good from 6/25-6/29.  Patient will need an updated Covid test prior to dc to facility.       Expected Discharge Plan: Skilled Nursing Facility Barriers to Discharge: Continued Medical Work up  Expected Discharge Plan and Services Expected Discharge Plan: [place]   Discharge Planning Services: CM Consult   Living arrangements for the past 2 months: [place]                           HH Arranged: PT [facility] Agency: Other - See comment ([place] has own HHPT) Date HH Agency Contacted: 07/31/19 Time [place]: 1256 Representative spoke with at [place]: [place] ([place]) Interventions    Readmission Risk Interventions No flowsheet data found.

## 2019-08-06 NOTE — Progress Notes (Signed)
PHARMACY NOTE -  Eliquis  Pharmacy has been assisting with dosing of Eliquis for NVAF.  Dosage remains stable at 2.5 mg PO bid and need for further dosage adjustment appears unlikely at present given dose adjustment criteria (Age > 80 and Wt << 60 kg) unlikely to change  Pharmacy will sign off, following peripherally for further dose adjustments and changes in CBC. Please reconsult if a change in clinical status warrants re-evaluation of dosage.  Reuel Boom, PharmD, BCPS [phone] 08/06/2019, 12:51 PM

## 2019-08-06 NOTE — Care Management Important Message (Signed)
Important Message  Patient Details IM Letter given to Nancy Marus RN Case Manager to present to the Patient Name: Deanna [name] MRN: [id number] Date of Birth: [birth year]   Medicare Important Message Given:  Yes     Kerin Salen 08/06/2019, 11:52 AM

## 2019-08-09 ENCOUNTER — Telehealth: Payer: Self-pay | Admitting: Gastroenterology

## 2019-08-09 NOTE — Telephone Encounter (Signed)
See results of pathology for details.

## 2019-08-09 NOTE — Telephone Encounter (Signed)
Patient's daughter calling to schedule biopsy

## 2019-08-17 ENCOUNTER — Ambulatory Visit (HOSPITAL_COMMUNITY): Payer: Medicare Other | Admitting: Physician Assistant

## 2019-08-17 ENCOUNTER — Inpatient Hospital Stay
Payer: No Typology Code available for payment source | Attending: Hematology & Oncology | Admitting: Hematology & Oncology

## 2019-08-17 ENCOUNTER — Other Ambulatory Visit: Payer: Self-pay

## 2019-08-17 ENCOUNTER — Inpatient Hospital Stay: Payer: No Typology Code available for payment source

## 2019-08-17 ENCOUNTER — Encounter: Payer: Self-pay | Admitting: Hematology & Oncology

## 2019-08-17 VITALS — BP 136/59 | HR 65 | Temp 98.4°F | Resp 18 | Wt 108.0 lb

## 2019-08-17 DIAGNOSIS — Z7189 Other specified counseling: Secondary | ICD-10-CM | POA: Diagnosis not present

## 2019-08-17 DIAGNOSIS — I4891 Unspecified atrial fibrillation: Secondary | ICD-10-CM | POA: Diagnosis not present

## 2019-08-17 DIAGNOSIS — R1900 Intra-abdominal and pelvic swelling, mass and lump, unspecified site: Secondary | ICD-10-CM

## 2019-08-17 DIAGNOSIS — Z7901 Long term (current) use of anticoagulants: Secondary | ICD-10-CM | POA: Insufficient documentation

## 2019-08-17 DIAGNOSIS — K922 Gastrointestinal hemorrhage, unspecified: Secondary | ICD-10-CM

## 2019-08-17 DIAGNOSIS — N289 Disorder of kidney and ureter, unspecified: Secondary | ICD-10-CM | POA: Insufficient documentation

## 2019-08-17 DIAGNOSIS — K668 Other specified disorders of peritoneum: Secondary | ICD-10-CM

## 2019-08-17 DIAGNOSIS — Z993 Dependence on wheelchair: Secondary | ICD-10-CM | POA: Insufficient documentation

## 2019-08-17 DIAGNOSIS — Z853 Personal history of malignant neoplasm of breast: Secondary | ICD-10-CM | POA: Diagnosis not present

## 2019-08-17 DIAGNOSIS — D500 Iron deficiency anemia secondary to blood loss (chronic): Secondary | ICD-10-CM | POA: Diagnosis present

## 2019-08-17 DIAGNOSIS — Z9012 Acquired absence of left breast and nipple: Secondary | ICD-10-CM | POA: Insufficient documentation

## 2019-08-17 DIAGNOSIS — R54 Age-related physical debility: Secondary | ICD-10-CM | POA: Insufficient documentation

## 2019-08-17 DIAGNOSIS — R1909 Other intra-abdominal and pelvic swelling, mass and lump: Secondary | ICD-10-CM | POA: Diagnosis not present

## 2019-08-17 DIAGNOSIS — K529 Noninfective gastroenteritis and colitis, unspecified: Secondary | ICD-10-CM | POA: Diagnosis not present

## 2019-08-17 HISTORY — DX: Gastrointestinal hemorrhage, unspecified: K92.2

## 2019-08-17 HISTORY — DX: Iron deficiency anemia secondary to blood loss (chronic): D50.0

## 2019-08-17 HISTORY — DX: Other specified counseling: Z71.89

## 2019-08-17 LAB — CMP (CANCER CENTER ONLY)
ALT: 19 U/L (ref 0–44)
AST: 12 U/L — ABNORMAL LOW (ref 15–41)
Albumin: 2.9 g/dL — ABNORMAL LOW (ref 3.5–5.0)
Alkaline Phosphatase: 47 U/L (ref 38–126)
Anion gap: 11 (ref 5–15)
BUN: 47 mg/dL — ABNORMAL HIGH (ref 8–23)
CO2: 22 mmol/L (ref 22–32)
Calcium: 9.1 mg/dL (ref 8.9–10.3)
Chloride: 102 mmol/L (ref 98–111)
Creatinine: 1.72 mg/dL — ABNORMAL HIGH (ref 0.44–1.00)
GFR, Est AFR Am: 30 mL/min — ABNORMAL LOW (ref >60)
GFR, Estimated: 26 mL/min — ABNORMAL LOW (ref >60)
Glucose, Bld: 251 mg/dL — ABNORMAL HIGH (ref 70–99)
Potassium: 4.7 mmol/L (ref 3.5–5.1)
Sodium: 135 mmol/L (ref 135–145)
Total Bilirubin: 0.3 mg/dL (ref 0.3–1.2)
Total Protein: 6.2 g/dL — ABNORMAL LOW (ref 6.5–8.1)

## 2019-08-17 LAB — CBC WITH DIFFERENTIAL (CANCER CENTER ONLY)
Abs Immature Granulocytes: 0.11 10*3/uL — ABNORMAL HIGH (ref 0.00–0.07)
Basophils Absolute: 0 10*3/uL (ref 0.0–0.1)
Basophils Relative: 0 %
Eosinophils Absolute: 0 10*3/uL (ref 0.0–0.5)
Eosinophils Relative: 0 %
HCT: 26.7 % — ABNORMAL LOW (ref 36.0–46.0)
Hemoglobin: 8.5 g/dL — ABNORMAL LOW (ref 12.0–15.0)
Immature Granulocytes: 1 %
Lymphocytes Relative: 9 %
Lymphs Abs: 1.2 10*3/uL (ref 0.7–4.0)
MCH: 28.5 pg (ref 26.0–34.0)
MCHC: 31.8 g/dL (ref 30.0–36.0)
MCV: 89.6 fL (ref 80.0–100.0)
Monocytes Absolute: 0.2 10*3/uL (ref 0.1–1.0)
Monocytes Relative: 1 %
Neutro Abs: 12.3 10*3/uL — ABNORMAL HIGH (ref 1.7–7.7)
Neutrophils Relative %: 89 %
Platelet Count: 204 10*3/uL (ref 150–400)
RBC: 2.98 MIL/uL — ABNORMAL LOW (ref 3.87–5.11)
RDW: 17.9 % — ABNORMAL HIGH (ref 11.5–15.5)
WBC Count: 13.8 10*3/uL — ABNORMAL HIGH (ref 4.0–10.5)
nRBC: 0 % (ref 0.0–0.2)

## 2019-08-17 LAB — URIC ACID: Uric Acid, Serum: 7.2 mg/dL — ABNORMAL HIGH (ref 2.5–7.1)

## 2019-08-17 NOTE — Progress Notes (Signed)
Referral MD  Reason for Referral: Mesenteric mass; GI bleeding secondary to colitis and anticoagulation  Chief Complaint  Patient presents with  . New Patient (Initial Visit)  : Patient really does not give much history.  HPI: Deanna [name] is a very charming 86 year old white female.  She has a remote history of breast cancer of the left breast.  She had a mastectomy.  This is back in 1998.  She did undergo adjuvant chemotherapy with 6 cycles of CMF.  She then was on tamoxifen or an aromatase inhibitor for 5 years.  She has cardiac issues.  She apparently has atrial fibrillation.  She is on low-dose Eliquis.  She developed colitis.  She had diarrhea.  She was hospitalized.  She had a CT scan while she was hospitalized.  This showed a mesenteric mass that was calcified.  It measured 3.1 x 4 cm.  It was not biopsy able given its location.  She did a 24-hour urine that was done which did not show an elevated 5-HIAA level.  She was then referred to the [facility] for further evaluation.  She lives in assisted living.  Her Deanna [name] comes with her.  Another Deanna [name] is on the cell phone.  The Deanna [name] who is with her is actually the wife one of my patients.  She comes in a wheelchair.  She is quite petite.  She weighs 108 pounds.  There is no abdominal pain.  There is no diarrhea currently.  She is not eating all that much.  She was placed on iron at home.  This definitely is not working.  Her hemoglobin is 8.5.  She has renal insufficiency.  She really needs to be transfused.  I really think that she needs to come off the Eliquis.  I do still know if Eliquis is a good idea for her given her age, frailty, and colitis with GI bleeding.  Her Deanna [name] says that there is some blood per rectum.  She does not smoke.  She does not drink alcohol.  Her husband died 2 years ago.  He was in the [employer] for 23 years.  She has had no cough.  There is no wheezing.  She has had no  nausea or vomiting.  Overall, her performance status is ECOG 3, at best.    Past Medical History:  Diagnosis Date  . Arthritis    all over  . Cancer ([place])    breast  . Depression   . Diabetes mellitus without complication (HCC)    diet controlled  . Goals of care, counseling/discussion 08/17/2019  . Hypertension   . Iron deficiency anemia due to chronic blood loss 08/17/2019  . Lower GI bleed 08/17/2019  :  Past Surgical History:  Procedure Laterality Date  . APPENDECTOMY     with lower back surgery  . BACK SURGERY     x2  . BIOPSY  08/02/2019   Procedure: BIOPSY;  Surgeon: Yetta Flock, MD;  Location: [facility];  Service: Gastroenterology;;  . BREAST SURGERY Left 1995   complete left mastectomy  . CERVICAL SPINE SURGERY    . COLONOSCOPY WITH PROPOFOL N/A 08/02/2019   Procedure: COLONOSCOPY WITH PROPOFOL;  Surgeon: Yetta Flock, MD;  Location: [facility];  Service: Gastroenterology;  Laterality: N/A;  . EYE SURGERY Bilateral    cataracts  . TONSILLECTOMY     at age 64  :   Current Outpatient Medications:  .  acetaminophen (TYLENOL) 325 MG tablet, Take 650 mg  by mouth 2 (two) times a day. , Disp: , Rfl:  .  apixaban (ELIQUIS) 2.5 MG TABS tablet, Take 1 tablet (2.5 mg total) by mouth 2 (two) times daily., Disp: 60 tablet, Rfl:  .  aspirin EC 81 MG tablet, Take 81 mg by mouth at bedtime. , Disp: , Rfl:  .  buPROPion (WELLBUTRIN XL) 150 MG 24 hr tablet, Take 150 mg by mouth daily., Disp: , Rfl:  .  carvedilol (COREG) 25 MG tablet, Take 1 tablet (25 mg total) by mouth 2 (two) times daily with a meal., Disp: , Rfl:  .  Cyanocobalamin (VITAMIN B 12 PO), Take 1,000 mcg by mouth daily., Disp: , Rfl:  .  cyclobenzaprine (FLEXERIL) 5 MG tablet, Take 5 mg by mouth every 8 (eight) hours as needed for muscle spasms., Disp: , Rfl:  .  denosumab (PROLIA) 60 MG/ML SOLN injection, Inject 60 mg into the skin every 6 (six) months. Administer in upper arm, thigh, or abdomen,  Disp: , Rfl:  .  diclofenac Sodium (VOLTAREN) 1 % GEL, Apply 2 g topically 4 (four) times daily as needed (painful joints)., Disp: , Rfl:  .  diltiazem (CARDIZEM CD) 180 MG 24 hr capsule, Take 1 capsule (180 mg total) by mouth daily., Disp: , Rfl:  .  escitalopram (LEXAPRO) 10 MG tablet, Take 10 mg by mouth daily., Disp: , Rfl:  .  esomeprazole (NEXIUM) 40 MG capsule, Take 40 mg by mouth daily at 12 noon., Disp: , Rfl:  .  feeding supplement, ENSURE ENLIVE, (ENSURE ENLIVE) LIQD, Take 237 mLs by mouth 3 (three) times daily between meals. (Patient not taking: Reported on 07/27/2019), Disp: 237 mL, Rfl: 12 .  ferrous sulfate 325 (65 FE) MG tablet, Take 1 tablet (325 mg total) by mouth daily with breakfast., Disp: , Rfl:  .  hydrochlorothiazide (MICROZIDE) 12.5 MG capsule, Take 12.5 mg by mouth daily., Disp: , Rfl:  .  insulin glargine (LANTUS) 100 UNIT/ML injection, Inject 0.05 mLs (5 Units total) into the skin daily., Disp: , Rfl:  .  loperamide (IMODIUM A-D) 2 MG tablet, Take 2 mg by mouth 4 (four) times daily as needed for diarrhea or loose stools., Disp: , Rfl:  .  mesalamine (LIALDA) 1.2 g EC tablet, Take 2.4 g by mouth daily with breakfast., Disp: , Rfl:  .  metFORMIN (GLUCOPHAGE) 500 MG tablet, Take 500 mg by mouth daily with breakfast., Disp: , Rfl:  .  predniSONE (DELTASONE) 10 MG tablet, Take 4 tablets (40 mg total) by mouth daily for 3 days, THEN 3.5 tablets (35 mg total) daily for 7 days, THEN 3 tablets (30 mg total) daily for 7 days, THEN 2.5 tablets (25 mg total) daily for 7 days, THEN 2 tablets (20 mg total) daily for 7 days, THEN 1.5 tablets (15 mg total) daily for 7 days, THEN 1 tablet (10 mg total) daily for 7 days, THEN 0.5 tablets (5 mg total) daily for 7 days. Total taper ENDING: 09/27/2019., Disp: , Rfl:  .  pregabalin (LYRICA) 75 MG capsule, Take 1 capsule (75 mg total) by mouth 2 (two) times daily., Disp: 10 capsule, Rfl: 0 .  Probiotic Product (PROBIOTIC-10 PO), Take 1 capsule by  mouth daily., Disp: , Rfl:  .  rosuvastatin (CRESTOR) 10 MG tablet, Take 10 mg by mouth at bedtime. , Disp: , Rfl:  .  SYMAX-SR 0.375 MG 12 hr tablet, Take 0.375 mg by mouth every 12 (twelve) hours as needed for pain., Disp: , Rfl:  .  VITAMIN D, CHOLECALCIFEROL, PO, Take 400 Units by mouth daily. , Disp: , Rfl:  .  vitamin E 400 UNIT capsule, Take 400 Units by mouth 2 (two) times a day. , Disp: , Rfl: :  :  Allergies  Allergen Reactions  . Codeine Nausea Only  :  Family History  Problem Relation Age of Onset  . Hypertension Mother   . Heart disease Mother   . Hypertension Father   . Heart disease Father   :  Social History   Socioeconomic History  . Marital status: Widowed    Spouse name: Not on file  . Number of children: Not on file  . Years of education: Not on file  . Highest education level: Not on file  Occupational History  . Not on file  Tobacco Use  . Smoking status: Never Smoker  . Smokeless tobacco: Never Used  Vaping Use  . Vaping Use: Never used  Substance and Sexual Activity  . Alcohol use: Never  . Drug use: Never  . Sexual activity: Not on file  Other Topics Concern  . Not on file  Social History Narrative  . Not on file   Social Determinants of Health   Financial Resource Strain:   . Difficulty of Paying Living Expenses:   Food Insecurity:   . Worried About Charity fundraiser in the Last Year:   . Arboriculturist in the Last Year:   Transportation Needs:   . Film/video editor (Medical):   Marland Kitchen Lack of Transportation (Non-Medical):   Physical Activity:   . Days of Exercise per Week:   . Minutes of Exercise per Session:   Stress:   . Feeling of Stress :   Social Connections:   . Frequency of Communication with Friends and Family:   . Frequency of Social Gatherings with Friends and Family:   . Attends Religious Services:   . Active Member of Clubs or Organizations:   . Attends Archivist Meetings:   Marland Kitchen Marital Status:    Intimate Partner Violence:   . Fear of Current or Ex-Partner:   . Emotionally Abused:   Marland Kitchen Physically Abused:   . Sexually Abused:   :  Review of Systems  Constitutional: Positive for malaise/fatigue and weight loss.  HENT: Negative.   Eyes: Negative.   Respiratory: Negative.   Cardiovascular: Positive for palpitations.  Gastrointestinal: Positive for blood in stool and diarrhea.  Genitourinary: Negative.   Musculoskeletal: Negative.   Skin: Negative.   Neurological: Negative.   Endo/Heme/Allergies: Bruises/bleeds easily.  Psychiatric/Behavioral: Negative.      Exam:  This is an elderly, petite white female in no obvious distress.  Vital signs are temperature 98.4.  Pulse 65.  Blood pressure 136/59.  Weight is 108 pounds.  Head and neck exam shows no ocular or oral lesions.  She has no palpable cervical or supraclavicular lymph nodes.  Thyroid is nonpalpable.  Lungs are relatively clear bilaterally.  She has good air movement bilaterally.  Cardiac exam regular rate and rhythm with a normal S1 and S2.  I do not detect any irregular rhythm.  Abdomen is soft.  There is no guarding or rebound tenderness.  There is no fluid wave.  There is no palpable liver or spleen tip.  Extremities show some muscle atrophy in upper and lower extremities.  She has decent strength in her lower extremities.  Skin exam shows some scattered ecchymoses.  Neurological exam is nonfocal.  @IPVITALS @   Recent  Labs    08/17/19 1527  WBC 13.8*  HGB 8.5*  HCT 26.7*  PLT 204   Recent Labs    08/17/19 1527  NA 135  K 4.7  CL 102  CO2 22  GLUCOSE 251*  BUN 47*  CREATININE 1.72*  CALCIUM 9.1    Blood smear review: None  Pathology: None    Assessment and Plan: Ms. Grosvenor is a very nice 86 year old white female.  Sh has a mesenteric mass.  I had to believe that she has had this for quite a while.  Typically when these mesenteric masses are any masses calcified, it typically is not an aggressive  process.  I do not think that she is symptomatic at all from this.  I do not think had anything to do with her having diarrhea and colitis.  We have to wait a chromogranin A level.  We will see if this is elevated.  We will get a dotatate PET scan on her.  If this lights up, then I really think that she does have a carcinoid without having a tissue diagnosis.  Overall, I really do not see that she will need any treatment for this.  Again, we do not know how long she has had this mass.  It is in an area where there is no nearby intestines or blood vessels so I do not think that it will ever cause her a problem.  My concern right now is her anemia.  I really think that she is going need to be transfused.  She has significant renal insufficiency and I suspect that her erythropoietin level is going to be low.  I really do not think that she needs to be on Eliquis.  Even though she is on low-dose Eliquis, she has issues with bleeding per rectum.  She has had this recent colitis.  She is not good to absorb oral iron because she is on a PPI.  As such, she will need IV iron.  We spent about an hour with her.  This was somewhat complicated.  Again, the main problem right now is the anemia and the GI bleeding from Eliquis/colitis.  We will set her up with 2 units of blood on Monday, July 12.  We will give her IV iron at the time.  No matter what, the primary goal is quality of life for her.  Again she will live with this mesenteric mass.  I just cannot imagine that it will ever cause her to have any problems.

## 2019-08-18 LAB — CA 125: Cancer Antigen (CA) 125: 39.4 U/mL — ABNORMAL HIGH (ref 0.0–38.1)

## 2019-08-20 ENCOUNTER — Other Ambulatory Visit: Payer: Self-pay | Admitting: Family

## 2019-08-20 ENCOUNTER — Inpatient Hospital Stay: Payer: No Typology Code available for payment source

## 2019-08-20 ENCOUNTER — Telehealth (HOSPITAL_COMMUNITY): Payer: Self-pay | Admitting: *Deleted

## 2019-08-20 ENCOUNTER — Telehealth: Payer: Self-pay | Admitting: Cardiovascular Disease

## 2019-08-20 ENCOUNTER — Other Ambulatory Visit: Payer: Self-pay

## 2019-08-20 ENCOUNTER — Telehealth: Payer: Self-pay | Admitting: Hematology & Oncology

## 2019-08-20 VITALS — BP 171/74 | HR 67 | Temp 98.0°F | Resp 17

## 2019-08-20 DIAGNOSIS — D649 Anemia, unspecified: Secondary | ICD-10-CM

## 2019-08-20 DIAGNOSIS — K668 Other specified disorders of peritoneum: Secondary | ICD-10-CM

## 2019-08-20 DIAGNOSIS — D500 Iron deficiency anemia secondary to blood loss (chronic): Secondary | ICD-10-CM | POA: Diagnosis not present

## 2019-08-20 DIAGNOSIS — K922 Gastrointestinal hemorrhage, unspecified: Secondary | ICD-10-CM

## 2019-08-20 LAB — CBC WITH DIFFERENTIAL (CANCER CENTER ONLY)
Abs Immature Granulocytes: 0.34 10*3/uL — ABNORMAL HIGH (ref 0.00–0.07)
Basophils Absolute: 0 10*3/uL (ref 0.0–0.1)
Basophils Relative: 0 %
Eosinophils Absolute: 0.2 10*3/uL (ref 0.0–0.5)
Eosinophils Relative: 1 %
HCT: 27.4 % — ABNORMAL LOW (ref 36.0–46.0)
Hemoglobin: 8.8 g/dL — ABNORMAL LOW (ref 12.0–15.0)
Immature Granulocytes: 2 %
Lymphocytes Relative: 18 %
Lymphs Abs: 2.8 10*3/uL (ref 0.7–4.0)
MCH: 28.6 pg (ref 26.0–34.0)
MCHC: 32.1 g/dL (ref 30.0–36.0)
MCV: 89 fL (ref 80.0–100.0)
Monocytes Absolute: 0.5 10*3/uL (ref 0.1–1.0)
Monocytes Relative: 3 %
Neutro Abs: 12.1 10*3/uL — ABNORMAL HIGH (ref 1.7–7.7)
Neutrophils Relative %: 76 %
Platelet Count: 197 10*3/uL (ref 150–400)
RBC: 3.08 MIL/uL — ABNORMAL LOW (ref 3.87–5.11)
RDW: 18 % — ABNORMAL HIGH (ref 11.5–15.5)
WBC Count: 16 10*3/uL — ABNORMAL HIGH (ref 4.0–10.5)
nRBC: 0 % (ref 0.0–0.2)

## 2019-08-20 LAB — IRON AND TIBC
Iron: 24 ug/dL — ABNORMAL LOW (ref 41–142)
Saturation Ratios: 8 % — ABNORMAL LOW (ref 21–57)
TIBC: 314 ug/dL (ref 236–444)
UIBC: 290 ug/dL (ref 120–384)

## 2019-08-20 LAB — SAMPLE TO BLOOD BANK

## 2019-08-20 LAB — RETICULOCYTES
Immature Retic Fract: 32 % — ABNORMAL HIGH (ref 2.3–15.9)
RBC.: 3.09 MIL/uL — ABNORMAL LOW (ref 3.87–5.11)
Retic Count, Absolute: 110.9 10*3/uL (ref 19.0–186.0)
Retic Ct Pct: 3.6 % — ABNORMAL HIGH (ref 0.4–3.1)

## 2019-08-20 LAB — LACTATE DEHYDROGENASE: LDH: 153 U/L (ref 98–192)

## 2019-08-20 LAB — PREPARE RBC (CROSSMATCH)

## 2019-08-20 LAB — CEA (IN HOUSE-CHCC): CEA (CHCC-In House): 1.94 ng/mL (ref 0.00–5.00)

## 2019-08-20 LAB — FERRITIN: Ferritin: 62 ng/mL (ref 11–307)

## 2019-08-20 LAB — ABO/RH: ABO/RH(D): A POS

## 2019-08-20 MED ORDER — DIPHENHYDRAMINE HCL 25 MG PO CAPS
25.0000 mg | ORAL_CAPSULE | Freq: Once | ORAL | Status: AC
  Administered 2019-08-20: 25 mg via ORAL

## 2019-08-20 MED ORDER — SODIUM CHLORIDE 0.9 % IV SOLN
200.0000 mg | Freq: Once | INTRAVENOUS | Status: AC
  Administered 2019-08-20: 200 mg via INTRAVENOUS
  Filled 2019-08-20: qty 200

## 2019-08-20 MED ORDER — ACETAMINOPHEN 325 MG PO TABS
ORAL_TABLET | ORAL | Status: AC
  Filled 2019-08-20: qty 2

## 2019-08-20 MED ORDER — ACETAMINOPHEN 325 MG PO TABS
650.0000 mg | ORAL_TABLET | Freq: Once | ORAL | Status: AC
  Administered 2019-08-20: 650 mg via ORAL

## 2019-08-20 MED ORDER — DIPHENHYDRAMINE HCL 25 MG PO CAPS
ORAL_CAPSULE | ORAL | Status: AC
  Filled 2019-08-20: qty 1

## 2019-08-20 MED ORDER — SODIUM CHLORIDE 0.9 % IV SOLN
Freq: Once | INTRAVENOUS | Status: AC
  Filled 2019-08-20: qty 250

## 2019-08-20 NOTE — Telephone Encounter (Signed)
Appointments scheduled calendar printed & mailed per 7/9 los 

## 2019-08-20 NOTE — Telephone Encounter (Signed)
Pt daughter called stating since leaving hospital she has had blood in her stool - she is now requiring blood transfusion today per Dr. Katheran Awe. He is very concerned regarding pt continued therapy of Eliquis. Patient has not been seen by our provider yet - Dr Oval Linsey was consulting physician in hospitalization - recommend calling her office to determine continuing Eliquis. Daughter will call her office now to further discuss.

## 2019-08-20 NOTE — Patient Instructions (Signed)
Blood Transfusion, Adult A blood transfusion is a procedure in which you receive blood or a type of blood cell (blood component) through an IV. You may need a blood transfusion when your blood level is low. This may result from a bleeding disorder, illness, injury, or surgery. The blood may come from a donor. You may also be able to donate blood for yourself (autologous blood donation) before a planned surgery. The blood given in a transfusion is made up of different blood components. You may receive:  Red blood cells. These carry oxygen to the cells in the body.  Platelets. These help your blood to clot.  Plasma. This is the liquid part of your blood. It carries proteins and other substances throughout the body.  White blood cells. These help you fight infections. If you have hemophilia or another clotting disorder, you may also receive other types of blood products. Tell a health care provider about:  Any blood disorders you have.  Any previous reactions you have had during a blood transfusion.  Any allergies you have.  All medicines you are taking, including vitamins, herbs, eye drops, creams, and over-the-counter medicines.  Any surgeries you have had.  Any medical conditions you have, including any recent fever or cold symptoms.  Whether you are pregnant or may be pregnant. What are the risks? Generally, this is a safe procedure. However, problems may occur.  The most common problems include: ? A mild allergic reaction, such as red, swollen areas of skin (hives) and itching. ? Fever or chills. This may be the body's response to new blood cells received. This may occur during or up to 4 hours after the transfusion.  More serious problems may include: ? Transfusion-associated circulatory overload (TACO), or too much fluid in the lungs. This may cause breathing problems. ? A serious allergic reaction, such as difficulty breathing or swelling around the face and lips. ?  Transfusion-related acute lung injury (TRALI), which causes breathing difficulty and low oxygen in the blood. This can occur within hours of the transfusion or several days later. ? Iron overload. This can happen after receiving many blood transfusions over a period of time. ? Infection or virus being transmitted. This is rare because donated blood is carefully tested before it is given. ? Hemolytic transfusion reaction. This is rare. It happens when your body's defense system (immune system)tries to attack the new blood cells. Symptoms may include fever, chills, nausea, low blood pressure, and low back or chest pain. ? Transfusion-associated graft-versus-host disease (TAGVHD). This is rare. It happens when donated cells attack your body's healthy tissues. What happens before the procedure? Medicines Ask your health care provider about:  Changing or stopping your regular medicines. This is especially important if you are taking diabetes medicines or blood thinners.  Taking medicines such as aspirin and ibuprofen. These medicines can thin your blood. Do not take these medicines unless your health care provider tells you to take them.  Taking over-the-counter medicines, vitamins, herbs, and supplements. General instructions  Follow instructions from your health care provider about eating and drinking restrictions.  You will have a blood test to determine your blood type. This is necessary to know what kind of blood your body will accept and to match it to the donor blood.  If you are going to have a planned surgery, you may be able to do an autologous blood donation. This may be done in case you need to have a transfusion.  You will have your temperature,  blood pressure, and pulse monitored before the transfusion.  If you have had an allergic reaction to a transfusion in the past, you may be given medicine to help prevent a reaction. This medicine may be given to you by mouth (orally) or  through an IV.  Set aside time for the blood transfusion. This procedure generally takes 1-4 hours to complete. What happens during the procedure?   An IV will be inserted into one of your veins.  The bag of donated blood will be attached to your IV. The blood will then enter through your vein.  Your temperature, blood pressure, and pulse will be monitored regularly during the transfusion. This monitoring is done to detect early signs of a transfusion reaction.  Tell your nurse right away if you have any of these symptoms during the transfusion: ? Shortness of breath or trouble breathing. ? Chest or back pain. ? Fever or chills. ? Hives or itching.  If you have any signs or symptoms of a reaction, your transfusion will be stopped and you may be given medicine.  When the transfusion is complete, your IV will be removed.  Pressure may be applied to the IV site for a few minutes.  A bandage (dressing)will be applied. The procedure may vary among health care providers and hospitals. What happens after the procedure?  Your temperature, blood pressure, pulse, breathing rate, and blood oxygen level will be monitored until you leave the hospital or clinic.  Your blood may be tested to see how you are responding to the transfusion.  You may be warmed with fluids or blankets to maintain a normal body temperature.  If you receive your blood transfusion in an outpatient setting, you will be told whom to contact to report any reactions. Where to find more information For more information on blood transfusions, visit the American Red Cross: redcross.org Summary  A blood transfusion is a procedure in which you receive blood or a type of blood cell (blood component) through an IV.  The blood you receive may come from a donor or be donated by yourself (autologous blood donation) before a planned surgery.  The blood given in a transfusion is made up of different blood components. You may  receive red blood cells, platelets, plasma, or white blood cells depending on the condition treated.  Your temperature, blood pressure, and pulse will be monitored before, during, and after the transfusion.  After the transfusion, your blood may be tested to see how your body has responded. This information is not intended to replace advice given to you by your health care provider. Make sure you discuss any questions you have with your health care provider. Document Revised: 07/20/2018 Document Reviewed: 07/20/2018 Elsevier Patient Education  [place].   Iron Sucrose injection What is this medicine? IRON SUCROSE (AHY ern SOO krohs) is an iron complex. Iron is used to make healthy red blood cells, which carry oxygen and nutrients throughout the body. This medicine is used to treat iron deficiency anemia in people with chronic kidney disease. This medicine may be used for other purposes; ask your health care provider or pharmacist if you have questions. COMMON BRAND NAME(S): Venofer What should I tell my health care provider before I take this medicine? They need to know if you have any of these conditions:  anemia not caused by low iron levels  heart disease  high levels of iron in the blood  kidney disease  liver disease  an unusual or allergic  reaction to iron, other medicines, foods, dyes, or preservatives  pregnant or trying to get pregnant  breast-feeding How should I use this medicine? This medicine is for infusion into a vein. It is given by a health care professional in a hospital or clinic setting. Talk to your pediatrician regarding the use of this medicine in children. While this drug may be prescribed for children as young as 2 years for selected conditions, precautions do apply. Overdosage: If you think you have taken too much of this medicine contact a poison control center or emergency room at once. NOTE: This medicine is only for you. Do not share this  medicine with others. What if I miss a dose? It is important not to miss your dose. Call your doctor or health care professional if you are unable to keep an appointment. What may interact with this medicine? Do not take this medicine with any of the following medications:  deferoxamine  dimercaprol  other iron products This medicine may also interact with the following medications:  chloramphenicol  deferasirox This list may not describe all possible interactions. Give your health care provider a list of all the medicines, herbs, non-prescription drugs, or dietary supplements you use. Also tell them if you smoke, drink alcohol, or use illegal drugs. Some items may interact with your medicine. What should I watch for while using this medicine? Visit your doctor or healthcare professional regularly. Tell your doctor or healthcare professional if your symptoms do not start to get better or if they get worse. You may need blood work done while you are taking this medicine. You may need to follow a special diet. Talk to your doctor. Foods that contain iron include: whole grains/cereals, dried fruits, beans, or peas, leafy green vegetables, and organ meats (liver, kidney). What side effects may I notice from receiving this medicine? Side effects that you should report to your doctor or health care professional as soon as possible:  allergic reactions like skin rash, itching or hives, swelling of the face, lips, or tongue  breathing problems  changes in blood pressure  cough  fast, irregular heartbeat  feeling faint or lightheaded, falls  fever or chills  flushing, sweating, or hot feelings  joint or muscle aches/pains  seizures  swelling of the ankles or feet  unusually weak or tired Side effects that usually do not require medical attention (report to your doctor or health care professional if they continue or are bothersome):  diarrhea  feeling achy  headache   irritation at site where injected  nausea, vomiting  stomach upset  tiredness This list may not describe all possible side effects. Call your doctor for medical advice about side effects. You may report side effects to FDA at 1-800-FDA-1088. Where should I keep my medicine? This drug is given in a hospital or clinic and will not be stored at home. NOTE: This sheet is a summary. It may not cover all possible information. If you have questions about this medicine, talk to your doctor, pharmacist, or health care provider.  2020 Elsevier/Gold Standard (2010-11-05 17:14:35)

## 2019-08-20 NOTE — Telephone Encounter (Signed)
LMTCB   Dr. Marin Olp her Hematologist has noted in her chart that he did not feel she still needed her Eliquis. Pt has had rectal bleeding.

## 2019-08-20 NOTE — Telephone Encounter (Signed)
New message   Per patient daughter wants to discuss a recommendation to come off the blood thinner. Please call to discuss.

## 2019-08-21 ENCOUNTER — Telehealth: Payer: Self-pay | Admitting: Hematology & Oncology

## 2019-08-21 LAB — BPAM RBC
Blood Product Expiration Date: 202107222359
Blood Product Expiration Date: 202107232359
ISSUE DATE / TIME: 202107121111
ISSUE DATE / TIME: 202107121111
Unit Type and Rh: 600
Unit Type and Rh: 600

## 2019-08-21 LAB — TYPE AND SCREEN
ABO/RH(D): A POS
Antibody Screen: NEGATIVE
Unit division: 0
Unit division: 0

## 2019-08-21 LAB — CHROMOGRANIN A: Chromogranin A (ng/mL): 2108 ng/mL — ABNORMAL HIGH (ref 0.0–101.8)

## 2019-08-21 NOTE — Telephone Encounter (Signed)
Appointments rescheduled update calendar mailed to patient  

## 2019-08-22 ENCOUNTER — Other Ambulatory Visit: Payer: Self-pay | Admitting: Hematology & Oncology

## 2019-08-22 ENCOUNTER — Ambulatory Visit (HOSPITAL_COMMUNITY): Payer: Medicare Other | Admitting: Physician Assistant

## 2019-08-22 LAB — ERYTHROPOIETIN: Erythropoietin: 70.5 m[IU]/mL — ABNORMAL HIGH (ref 2.6–18.5)

## 2019-08-22 NOTE — Telephone Encounter (Signed)
[name], MD  (2:02 PM)     I'm very sorry to hear that.  I agree that she should not be on a blood thinner anymore.      Spoke with Debbie's daughter to inform her of the above recommendations from Dr. Oval Linsey. She verbalized understanding and requesting for this message to be froward ed to Dr. Marin Olp as well as for [place] to contact [facility] where the patient currently is to make them aware. Spoke with Stanton Kidney at [facility] who is aware that the patient may stop her Eliquis. Mary verbalized understanding.

## 2019-08-22 NOTE — Telephone Encounter (Signed)
I'm very sorry to hear that.  I agree that she should not be on a blood thinner anymore.

## 2019-08-22 NOTE — Telephone Encounter (Signed)
Spoke to the patients daughter regarding her mothers recent care. Pt was recently admitted to [facility] hospital due to abdominal pain along with new onset Atrial Fibrillation. She was placed on low dose Eliquis by Dr. Oval Linsey. She was discharged to rehab where she began having blood in her stools. It was discovered that she has an abdominal mass and seen by oncology. Dr. Marin Olp, her oncologist has noted in her chart that he did not feel she still needed her Eliquis due to her bleeding. Pt received a blood transfusion earlier this week & had a reaction to the transfusion so she has been admitted to [facility] She is supposed to have a PET scan in the near future to evaluate her abdominal mass. Will route to Dr. Oval Linsey for advisement.

## 2019-08-27 NOTE — Progress Notes (Signed)
Primary Care Physician: Marton Redwood, MD Primary Cardiologist: Dr Oval Linsey Primary Electrophysiologist: Dr Rayann Heman Referring Physician: Dr Noberto Retort is a 86 y.o. female with a history of chronic LBBB, cardiac cath 2006 with no CAD, DM-2, pulmonary HTN, persistent atrial fibrillation who presents for follow up in the [facility].  The patient was initially diagnosed with atrial fibrillation 07/2019 in the setting of colitis and sepsis. She was rate controlled and started on Eliquis for a CHADS2VASC score of 5. Unfortunately, she had significant GI bleeding requiring transfusion. Eliquis was discontinued. She was hospitalized at [facility] 08/21/19 with a blood transfusion reaction. Today, she is back in SR and feeling well. She states her strength is returning. She is eager to go home from the rehab facility.   Today, she denies symptoms of palpitations, chest pain, shortness of breath, orthopnea, PND, lower extremity edema, dizziness, presyncope, syncope, snoring, daytime somnolence, bleeding, or neurologic sequela. The patient is tolerating medications without difficulties and is otherwise without complaint today.    Atrial Fibrillation Risk Factors:  she does not have symptoms or diagnosis of sleep apnea. she does not have a history of rheumatic fever.   she has a BMI of Body mass index is 19.42 kg/m.Marland Kitchen Filed Weights   08/28/19 0836  Weight: 48.2 kg    Family History  Problem Relation Age of Onset  . Hypertension Mother   . Heart disease Mother   . Hypertension Father   . Heart disease Father      Atrial Fibrillation Management history:  Previous antiarrhythmic drugs: none Previous cardioversions: none Previous ablations: none CHADS2VASC score: 5 Anticoagulation history: Eliquis   Past Medical History:  Diagnosis Date  . Arthritis    all over  . Cancer ([place])    breast  . Depression   . Diabetes mellitus without complication  (HCC)    diet controlled  . Goals of care, counseling/discussion 08/17/2019  . Hypertension   . Iron deficiency anemia due to chronic blood loss 08/17/2019  . Lower GI bleed 08/17/2019   Past Surgical History:  Procedure Laterality Date  . APPENDECTOMY     with lower back surgery  . BACK SURGERY     x2  . BIOPSY  08/02/2019   Procedure: BIOPSY;  Surgeon: Yetta Flock, MD;  Location: [facility];  Service: Gastroenterology;;  . BREAST SURGERY Left 1995   complete left mastectomy  . CERVICAL SPINE SURGERY    . COLONOSCOPY WITH PROPOFOL N/A 08/02/2019   Procedure: COLONOSCOPY WITH PROPOFOL;  Surgeon: Yetta Flock, MD;  Location: [facility];  Service: Gastroenterology;  Laterality: N/A;  . EYE SURGERY Bilateral    cataracts  . TONSILLECTOMY     at age 14    Current Outpatient Medications  Medication Sig Dispense Refill  . acetaminophen (TYLENOL) 325 MG tablet Take 650 mg by mouth 2 (two) times a day.     Marland Kitchen aspirin EC 81 MG tablet Take 81 mg by mouth at bedtime.     Marland Kitchen buPROPion (WELLBUTRIN XL) 150 MG 24 hr tablet Take 150 mg by mouth daily.    . carvedilol (COREG) 25 MG tablet Take 1 tablet (25 mg total) by mouth 2 (two) times daily with a meal.    . Cyanocobalamin (VITAMIN B 12 PO) Take 1,000 mcg by mouth daily.    . cyclobenzaprine (FLEXERIL) 5 MG tablet Take 5 mg by mouth every 8 (eight) hours as needed for muscle spasms.    Marland Kitchen  denosumab (PROLIA) 60 MG/ML SOLN injection Inject 60 mg into the skin every 6 (six) months. Administer in upper arm, thigh, or abdomen    . diclofenac Sodium (VOLTAREN) 1 % GEL Apply 2 g topically 4 (four) times daily as needed (painful joints).    . diltiazem (CARDIZEM CD) 180 MG 24 hr capsule Take 1 capsule (180 mg total) by mouth daily.    Marland Kitchen escitalopram (LEXAPRO) 10 MG tablet Take 10 mg by mouth daily.    Marland Kitchen esomeprazole (NEXIUM) 40 MG capsule Take 40 mg by mouth daily at 12 noon.    . feeding supplement, ENSURE ENLIVE, (ENSURE ENLIVE) LIQD  Take 237 mLs by mouth 3 (three) times daily between meals. 237 mL 12  . ferrous sulfate 325 (65 FE) MG tablet Take 1 tablet (325 mg total) by mouth daily with breakfast.    . hydrochlorothiazide (MICROZIDE) 12.5 MG capsule Take 12.5 mg by mouth daily.    . insulin glargine (LANTUS) 100 UNIT/ML injection Inject 0.05 mLs (5 Units total) into the skin daily.    Marland Kitchen loperamide (IMODIUM A-D) 2 MG tablet Take 2 mg by mouth 4 (four) times daily as needed for diarrhea or loose stools.    . mesalamine (LIALDA) 1.2 g EC tablet Take 2.4 g by mouth daily with breakfast.    . metFORMIN (GLUCOPHAGE) 500 MG tablet Take 500 mg by mouth daily with breakfast.    . predniSONE (DELTASONE) 10 MG tablet Take 4 tablets (40 mg total) by mouth daily for 3 days, THEN 3.5 tablets (35 mg total) daily for 7 days, THEN 3 tablets (30 mg total) daily for 7 days, THEN 2.5 tablets (25 mg total) daily for 7 days, THEN 2 tablets (20 mg total) daily for 7 days, THEN 1.5 tablets (15 mg total) daily for 7 days, THEN 1 tablet (10 mg total) daily for 7 days, THEN 0.5 tablets (5 mg total) daily for 7 days. Total taper ENDING: 09/27/2019.    . pregabalin (LYRICA) 75 MG capsule Take 1 capsule (75 mg total) by mouth 2 (two) times daily. 10 capsule 0  . Probiotic Product (PROBIOTIC-10 PO) Take 1 capsule by mouth daily.    . rosuvastatin (CRESTOR) 10 MG tablet Take 10 mg by mouth at bedtime.     Marland Kitchen SYMAX-SR 0.375 MG 12 hr tablet Take 0.375 mg by mouth every 12 (twelve) hours as needed for pain.    Marland Kitchen VITAMIN D, CHOLECALCIFEROL, PO Take 400 Units by mouth daily.     . vitamin E 400 UNIT capsule Take 400 Units by mouth 2 (two) times a day.      No current facility-administered medications for this encounter.    Allergies  Allergen Reactions  . Codeine Nausea Only    Social History   Socioeconomic History  . Marital status: Widowed    Spouse name: Not on file  . Number of children: Not on file  . Years of education: Not on file  . Highest  education level: Not on file  Occupational History  . Not on file  Tobacco Use  . Smoking status: Never Smoker  . Smokeless tobacco: Never Used  Vaping Use  . Vaping Use: Never used  Substance and Sexual Activity  . Alcohol use: Yes    Alcohol/week: 1.0 standard drink    Types: 1 Glasses of wine per week    Comment: yearly  . Drug use: Never  . Sexual activity: Not on file  Other Topics Concern  . Not  on file  Social History Narrative  . Not on file   Social Determinants of Health   Financial Resource Strain:   . Difficulty of Paying Living Expenses:   Food Insecurity:   . Worried About Charity fundraiser in the Last Year:   . Arboriculturist in the Last Year:   Transportation Needs:   . Film/video editor (Medical):   Marland Kitchen Lack of Transportation (Non-Medical):   Physical Activity:   . Days of Exercise per Week:   . Minutes of Exercise per Session:   Stress:   . Feeling of Stress :   Social Connections:   . Frequency of Communication with Friends and Family:   . Frequency of Social Gatherings with Friends and Family:   . Attends Religious Services:   . Active Member of Clubs or Organizations:   . Attends Archivist Meetings:   Marland Kitchen Marital Status:   Intimate Partner Violence:   . Fear of Current or Ex-Partner:   . Emotionally Abused:   Marland Kitchen Physically Abused:   . Sexually Abused:      ROS- All systems are reviewed and negative except as per the HPI above.  Physical Exam: Vitals:   08/28/19 0836  BP: 134/60  Pulse: (!) 59  Weight: 48.2 kg  Height: 5\' 2"  (1.575 m)    GEN- The patient is well appearing elderly female, alert and oriented x 3 today.   Head- normocephalic, atraumatic Eyes-  Sclera clear, conjunctiva pink Ears- hearing intact Oropharynx- clear Neck- supple  Lungs- Clear to ausculation bilaterally, normal work of breathing Heart- Regular rate and rhythm, no murmurs, rubs or gallops  GI- soft, NT, ND, + BS Extremities- no clubbing,  cyanosis, or edema MS- no significant deformity or atrophy Skin- no rash or lesion Psych- euthymic mood, full affect Neuro- strength and sensation are intact  Wt Readings from Last 3 Encounters:  08/28/19 48.2 kg  08/17/19 49 kg  08/06/19 48.2 kg    EKG today demonstrates SB HR 59, PR 154, QRS 130, QTc 435  Echo 07/31/19 demonstrated  1. Left ventricular ejection fraction, by estimation, is 60 to 65%. The  left ventricle has normal function. The left ventricle has no regional  wall motion abnormalities. There is moderate concentric left ventricular  hypertrophy. Left ventricular  diastolic parameters are indeterminate. Elevated left ventricular  end-diastolic pressure.  2. Right ventricular systolic function is normal. The right ventricular  size is normal. There is mildly elevated pulmonary artery systolic  pressure. The estimated right ventricular systolic pressure is 85.8 mmHg.  3. The mitral valve is normal in structure. Mild mitral valve  regurgitation. No evidence of mitral stenosis.  4. Tricuspid valve regurgitation is moderate.  5. The aortic valve is tricuspid. Aortic valve regurgitation is not  visualized. Mild to moderate aortic valve sclerosis/calcification is  present, without any evidence of aortic stenosis.  6. The inferior vena cava is normal in size with greater than 50%  respiratory variability, suggesting right atrial pressure of 3 mmHg.   Epic records are reviewed at length today  CHA2DS2-VASc Score = 5  The patient's score is based upon: CHF History: 0 HTN History: 1 Age : 2 Diabetes History: 1 Stroke History: 0 Vascular Disease History: 0 Gender: 1      ASSESSMENT AND PLAN: 1. Persistent Atrial Fibrillation (ICD10:  I48.19) The patient's CHA2DS2-VASc score is 5, indicating a 7.2% annual risk of stroke.   She is back in SR today. Suspect  afib was 2/2 sepsis. Agree with stopping anticoagulation given significant bleeding requiring  transfusion. She was started on ASA 81 mg during her last hospitalization although evidence is very limited regarding use in afib. Continue Coreg 25 mg BID Continue diltiazem 180 mg daily  2. Secondary Hypercoagulable State (ICD10:  D68.69) The patient is at significant risk for stroke/thromboembolism based upon her CHA2DS2-VASc Score of 5.  However, the patient is not on anticoagulation due to her high bleeding risk.     3. HTN Stable, no changes today.   Follow up with Dr Oval Linsey in 2 months.    [facility] [address] [place], [place] [address] [phone] 08/28/2019 9:06 AM

## 2019-08-28 ENCOUNTER — Ambulatory Visit (HOSPITAL_COMMUNITY)
Admission: RE | Admit: 2019-08-28 | Discharge: 2019-08-28 | Disposition: A | Discharge status: Home / Self Care | Payer: Medicare Other | Source: Ambulatory Visit | Attending: Physician Assistant | Admitting: Physician Assistant

## 2019-08-28 ENCOUNTER — Other Ambulatory Visit: Payer: Self-pay

## 2019-08-28 ENCOUNTER — Ambulatory Visit (HOSPITAL_COMMUNITY): Payer: Medicare Other | Admitting: Physician Assistant

## 2019-08-28 ENCOUNTER — Encounter (HOSPITAL_COMMUNITY): Payer: Self-pay | Admitting: Physician Assistant

## 2019-08-28 VITALS — BP 134/60 | HR 59 | Ht 62.0 in | Wt 106.2 lb

## 2019-08-28 DIAGNOSIS — E119 Type 2 diabetes mellitus without complications: Secondary | ICD-10-CM | POA: Insufficient documentation

## 2019-08-28 DIAGNOSIS — Z794 Long term (current) use of insulin: Secondary | ICD-10-CM | POA: Insufficient documentation

## 2019-08-28 DIAGNOSIS — I2510 Atherosclerotic heart disease of native coronary artery without angina pectoris: Secondary | ICD-10-CM | POA: Diagnosis not present

## 2019-08-28 DIAGNOSIS — Z7982 Long term (current) use of aspirin: Secondary | ICD-10-CM | POA: Insufficient documentation

## 2019-08-28 DIAGNOSIS — I4819 Other persistent atrial fibrillation: Secondary | ICD-10-CM | POA: Insufficient documentation

## 2019-08-28 DIAGNOSIS — I447 Left bundle-branch block, unspecified: Secondary | ICD-10-CM | POA: Insufficient documentation

## 2019-08-28 DIAGNOSIS — I10 Essential (primary) hypertension: Secondary | ICD-10-CM | POA: Insufficient documentation

## 2019-08-28 DIAGNOSIS — Z8249 Family history of ischemic heart disease and other diseases of the circulatory system: Secondary | ICD-10-CM | POA: Insufficient documentation

## 2019-08-28 DIAGNOSIS — Z885 Allergy status to narcotic agent status: Secondary | ICD-10-CM | POA: Diagnosis not present

## 2019-08-28 DIAGNOSIS — D6869 Other thrombophilia: Secondary | ICD-10-CM

## 2019-08-28 DIAGNOSIS — Z79899 Other long term (current) drug therapy: Secondary | ICD-10-CM | POA: Diagnosis not present

## 2019-08-31 ENCOUNTER — Ambulatory Visit (HOSPITAL_COMMUNITY): Admission: RE | Admit: 2019-08-31 | Payer: Medicare Other | Source: Ambulatory Visit

## 2019-09-04 ENCOUNTER — Ambulatory Visit (HOSPITAL_COMMUNITY)
Admission: RE | Admit: 2019-09-04 | Discharge: 2019-09-04 | Disposition: A | Discharge status: Home / Self Care | Payer: Medicare Other | Source: Ambulatory Visit | Attending: Hematology & Oncology | Admitting: Hematology & Oncology

## 2019-09-04 ENCOUNTER — Other Ambulatory Visit: Payer: Self-pay

## 2019-09-04 DIAGNOSIS — R1900 Intra-abdominal and pelvic swelling, mass and lump, unspecified site: Secondary | ICD-10-CM | POA: Insufficient documentation

## 2019-09-04 MED ORDER — GALLIUM GA 68 DOTATATE IV KIT
2.4000 | PACK | Freq: Once | INTRAVENOUS | Status: AC
  Administered 2019-09-04: 2.4 via INTRAVENOUS

## 2019-09-06 ENCOUNTER — Telehealth: Payer: Self-pay | Admitting: *Deleted

## 2019-09-06 NOTE — Telephone Encounter (Signed)
A detailed message was left, re: her follow up visit. 

## 2019-09-14 ENCOUNTER — Inpatient Hospital Stay (HOSPITAL_BASED_OUTPATIENT_CLINIC_OR_DEPARTMENT_OTHER): Payer: Medicare Other | Admitting: Hematology & Oncology

## 2019-09-14 ENCOUNTER — Inpatient Hospital Stay: Payer: Medicare Other | Attending: Hematology & Oncology

## 2019-09-14 ENCOUNTER — Other Ambulatory Visit: Payer: Self-pay

## 2019-09-14 ENCOUNTER — Encounter: Payer: Self-pay | Admitting: Hematology & Oncology

## 2019-09-14 VITALS — BP 151/60 | HR 67 | Temp 98.1°F | Resp 17 | Ht 62.0 in | Wt 104.4 lb

## 2019-09-14 DIAGNOSIS — R1909 Other intra-abdominal and pelvic swelling, mass and lump: Secondary | ICD-10-CM | POA: Insufficient documentation

## 2019-09-14 DIAGNOSIS — N289 Disorder of kidney and ureter, unspecified: Secondary | ICD-10-CM | POA: Insufficient documentation

## 2019-09-14 DIAGNOSIS — K668 Other specified disorders of peritoneum: Secondary | ICD-10-CM

## 2019-09-14 DIAGNOSIS — D500 Iron deficiency anemia secondary to blood loss (chronic): Secondary | ICD-10-CM

## 2019-09-14 DIAGNOSIS — C7A010 Malignant carcinoid tumor of the duodenum: Secondary | ICD-10-CM | POA: Diagnosis not present

## 2019-09-14 DIAGNOSIS — E119 Type 2 diabetes mellitus without complications: Secondary | ICD-10-CM

## 2019-09-14 DIAGNOSIS — K922 Gastrointestinal hemorrhage, unspecified: Secondary | ICD-10-CM

## 2019-09-14 DIAGNOSIS — D509 Iron deficiency anemia, unspecified: Secondary | ICD-10-CM | POA: Insufficient documentation

## 2019-09-14 HISTORY — DX: Malignant carcinoid tumor of the duodenum: C7A.010

## 2019-09-14 LAB — CBC WITH DIFFERENTIAL (CANCER CENTER ONLY)
Abs Immature Granulocytes: 0.11 10*3/uL — ABNORMAL HIGH (ref 0.00–0.07)
Basophils Absolute: 0.1 10*3/uL (ref 0.0–0.1)
Basophils Relative: 1 %
Eosinophils Absolute: 0.1 10*3/uL (ref 0.0–0.5)
Eosinophils Relative: 1 %
HCT: 39.9 % (ref 36.0–46.0)
Hemoglobin: 12.7 g/dL (ref 12.0–15.0)
Immature Granulocytes: 1 %
Lymphocytes Relative: 16 %
Lymphs Abs: 2.1 10*3/uL (ref 0.7–4.0)
MCH: 29 pg (ref 26.0–34.0)
MCHC: 31.8 g/dL (ref 30.0–36.0)
MCV: 91.1 fL (ref 80.0–100.0)
Monocytes Absolute: 0.6 10*3/uL (ref 0.1–1.0)
Monocytes Relative: 5 %
Neutro Abs: 9.8 10*3/uL — ABNORMAL HIGH (ref 1.7–7.7)
Neutrophils Relative %: 76 %
Platelet Count: 182 10*3/uL (ref 150–400)
RBC: 4.38 MIL/uL (ref 3.87–5.11)
RDW: 16 % — ABNORMAL HIGH (ref 11.5–15.5)
WBC Count: 12.7 10*3/uL — ABNORMAL HIGH (ref 4.0–10.5)
nRBC: 0 % (ref 0.0–0.2)

## 2019-09-14 LAB — CMP (CANCER CENTER ONLY)
ALT: 15 U/L (ref 0–44)
AST: 12 U/L — ABNORMAL LOW (ref 15–41)
Albumin: 4.1 g/dL (ref 3.5–5.0)
Alkaline Phosphatase: 45 U/L (ref 38–126)
Anion gap: 8 (ref 5–15)
BUN: 43 mg/dL — ABNORMAL HIGH (ref 8–23)
CO2: 27 mmol/L (ref 22–32)
Calcium: 10.4 mg/dL — ABNORMAL HIGH (ref 8.9–10.3)
Chloride: 102 mmol/L (ref 98–111)
Creatinine: 1.42 mg/dL — ABNORMAL HIGH (ref 0.44–1.00)
GFR, Est AFR Am: 38 mL/min — ABNORMAL LOW (ref >60)
GFR, Estimated: 33 mL/min — ABNORMAL LOW (ref >60)
Glucose, Bld: 195 mg/dL — ABNORMAL HIGH (ref 70–99)
Potassium: 4.3 mmol/L (ref 3.5–5.1)
Sodium: 137 mmol/L (ref 135–145)
Total Bilirubin: 0.4 mg/dL (ref 0.3–1.2)
Total Protein: 6.6 g/dL (ref 6.5–8.1)

## 2019-09-14 LAB — SAMPLE TO BLOOD BANK

## 2019-09-17 ENCOUNTER — Encounter: Payer: Self-pay | Admitting: *Deleted

## 2019-09-17 LAB — IRON AND TIBC
Iron: 90 ug/dL (ref 41–142)
Saturation Ratios: 30 % (ref 21–57)
TIBC: 298 ug/dL (ref 236–444)
UIBC: 207 ug/dL (ref 120–384)

## 2019-09-17 LAB — CHROMOGRANIN A: Chromogranin A (ng/mL): 2436 ng/mL — ABNORMAL HIGH (ref 0.0–101.8)

## 2019-09-17 LAB — FERRITIN: Ferritin: 251 ng/mL (ref 11–307)

## 2019-09-17 NOTE — Progress Notes (Signed)
Hematology and Oncology Follow Up Visit  Deanna [name] [id number] 1930-04-23 86 y.o. 09/17/2019   Principle Diagnosis:   Abdominal carcinoid  Iron deficiency anemia-blood loss   Current Therapy:    IV iron-Feraheme given on 01/22/2020     Interim History:  Deanna [name] is back for follow-up.  This is her second office visit.  We saw her in July.  She had this mesenteric mass.  It was calcified.  We went ahead and did a work-up on this.  Ultimately, we did do a dotatate PET scan.  This did show some activity in the abdominal mass.  There was not a lot of activity but yet it probably was not to confirm that she had carcinoid.  I think the critical test was the chromogranin A level.  This was over 2100.  She also has iron deficiency anemia.  More we first saw her, her ferritin was 62 with an iron saturation of only 8%.  We actually had to transfuse her.  She has renal insufficiency.  Her erythropoietin level is 71.  She unfortunately got into fluid overload and had to be admitted to the hospital.  She actually looks fairly good right now.  She comes in with one of her daughters.  Another daughter is on the cell phone.  I went over all of her x-ray reports and labs.  I told them what she had.  I explained to them that some like this typically is operated on.  This is not obviously metastatic.  Unfortunately, she just does not have a good performance status to be able to undergo surgery.  The other option would be to use hormonal therapy with octreotide or Somatuline.  I explained this to them.  I explained how it works.  I would think that this would be effective.  She really would like to hold off on doing anything right now.  I certainly have no problems with this.  She seems to be eating okay.  There is no nausea or vomiting.  She has had no cough.  There is been no change in bowel or bladder habits.  She has had no obvious melena.  Overall, I would have to say her performance  status is by ECOG 3.   Medications:  Current Outpatient Medications:  .  acetaminophen (TYLENOL) 325 MG tablet, Take 650 mg by mouth 2 (two) times a day. , Disp: , Rfl:  .  aspirin EC 81 MG tablet, Take 81 mg by mouth at bedtime. , Disp: , Rfl:  .  buPROPion (WELLBUTRIN XL) 150 MG 24 hr tablet, Take 150 mg by mouth daily., Disp: , Rfl:  .  carvedilol (COREG) 25 MG tablet, Take 1 tablet (25 mg total) by mouth 2 (two) times daily with a meal., Disp: , Rfl:  .  Cyanocobalamin (VITAMIN B 12 PO), Take 1,000 mcg by mouth daily., Disp: , Rfl:  .  cyclobenzaprine (FLEXERIL) 5 MG tablet, Take 5 mg by mouth every 8 (eight) hours as needed for muscle spasms., Disp: , Rfl:  .  denosumab (PROLIA) 60 MG/ML SOLN injection, Inject 60 mg into the skin every 6 (six) months. Administer in upper arm, thigh, or abdomen, Disp: , Rfl:  .  diclofenac Sodium (VOLTAREN) 1 % GEL, Apply 2 g topically 4 (four) times daily as needed (painful joints)., Disp: , Rfl:  .  diltiazem (CARDIZEM CD) 180 MG 24 hr capsule, Take 1 capsule (180 mg total) by mouth daily., Disp: , Rfl:  .  escitalopram (LEXAPRO) 10 MG tablet, Take 10 mg by mouth daily., Disp: , Rfl:  .  esomeprazole (NEXIUM) 40 MG capsule, Take 40 mg by mouth daily at 12 noon., Disp: , Rfl:  .  feeding supplement, ENSURE ENLIVE, (ENSURE ENLIVE) LIQD, Take 237 mLs by mouth 3 (three) times daily between meals., Disp: 237 mL, Rfl: 12 .  ferrous sulfate 325 (65 FE) MG tablet, Take 1 tablet (325 mg total) by mouth daily with breakfast., Disp: , Rfl:  .  HUMALOG KWIKPEN 100 UNIT/ML KwikPen, Inject into the skin as directed., Disp: , Rfl:  .  hydrochlorothiazide (MICROZIDE) 12.5 MG capsule, Take 12.5 mg by mouth daily., Disp: , Rfl:  .  insulin glargine (LANTUS) 100 UNIT/ML injection, Inject 0.05 mLs (5 Units total) into the skin daily., Disp: , Rfl:  .  loperamide (IMODIUM A-D) 2 MG tablet, Take 2 mg by mouth 4 (four) times daily as needed for diarrhea or loose stools., Disp: ,  Rfl:  .  mesalamine (LIALDA) 1.2 g EC tablet, Take 2.4 g by mouth daily with breakfast., Disp: , Rfl:  .  metFORMIN (GLUCOPHAGE) 500 MG tablet, Take 500 mg by mouth daily with breakfast., Disp: , Rfl:  .  predniSONE (DELTASONE) 10 MG tablet, Take 4 tablets (40 mg total) by mouth daily for 3 days, THEN 3.5 tablets (35 mg total) daily for 7 days, THEN 3 tablets (30 mg total) daily for 7 days, THEN 2.5 tablets (25 mg total) daily for 7 days, THEN 2 tablets (20 mg total) daily for 7 days, THEN 1.5 tablets (15 mg total) daily for 7 days, THEN 1 tablet (10 mg total) daily for 7 days, THEN 0.5 tablets (5 mg total) daily for 7 days. Total taper ENDING: 09/27/2019., Disp: , Rfl:  .  pregabalin (LYRICA) 75 MG capsule, Take 1 capsule (75 mg total) by mouth 2 (two) times daily., Disp: 10 capsule, Rfl: 0 .  Probiotic Product (PROBIOTIC-10 PO), Take 1 capsule by mouth daily., Disp: , Rfl:  .  rosuvastatin (CRESTOR) 10 MG tablet, Take 10 mg by mouth at bedtime. , Disp: , Rfl:  .  SYMAX-SR 0.375 MG 12 hr tablet, Take 0.375 mg by mouth every 12 (twelve) hours as needed for pain., Disp: , Rfl:  .  VITAMIN D, CHOLECALCIFEROL, PO, Take 400 Units by mouth daily. , Disp: , Rfl:  .  vitamin E 400 UNIT capsule, Take 400 Units by mouth 2 (two) times a day. , Disp: , Rfl:   Allergies:  Allergies  Allergen Reactions  . Codeine Nausea Only    Past Medical History, Surgical history, Social history, and Family History were reviewed and updated.  Review of Systems: Review of Systems  Constitutional: Positive for fatigue.  HENT:  Negative.   Eyes: Negative.   Respiratory: Positive for shortness of breath.   Cardiovascular: Positive for leg swelling.  Gastrointestinal: Positive for abdominal pain.  Endocrine: Negative.   Genitourinary: Negative.    Musculoskeletal: Positive for arthralgias and myalgias.  Skin: Negative.   Neurological: Positive for dizziness.  Hematological: Negative.   Psychiatric/Behavioral:  Negative.     Physical Exam:  height is 5\' 2"  (1.575 m) and weight is 104 lb 6.4 oz (47.4 kg). Her oral temperature is 98.1 F (36.7 C). Her blood pressure is 151/60 (abnormal) and her pulse is 67. Her respiration is 17 and oxygen saturation is 100%.   Wt Readings from Last 3 Encounters:  09/14/19 104 lb 6.4 oz (47.4 kg)  08/28/19 106  lb 3.2 oz (48.2 kg)  08/17/19 108 lb (49 kg)    Physical Exam Vitals reviewed.  HENT:     Head: Normocephalic and atraumatic.  Eyes:     Pupils: Pupils are equal, round, and reactive to light.  Cardiovascular:     Rate and Rhythm: Normal rate and regular rhythm.     Heart sounds: Normal heart sounds.     Comments: Cardiac exam shows a regular rate and rhythm with occasional extra beat.  She has a 1/6 systolic ejection murmur. Pulmonary:     Effort: Pulmonary effort is normal.     Breath sounds: Normal breath sounds.  Abdominal:     General: Bowel sounds are normal.     Palpations: Abdomen is soft.     Comments: Her abdominal exam is soft.  Bowel sounds are present.  There is no obvious fluid wave.  There is no obvious abdominal mass.  Musculoskeletal:        General: No tenderness or deformity. Normal range of motion.     Cervical back: Normal range of motion.     Comments: Extremities shows some symmetric weakness in upper and lower extremities.  She has some arthritic changes in her joints.  She has decent pulses in her distal extremities.  She has some trace edema in her lower legs.  Lymphadenopathy:     Cervical: No cervical adenopathy.  Skin:    General: Skin is warm and dry.     Findings: No erythema or rash.  Neurological:     Mental Status: She is alert and oriented to person, place, and time.  Psychiatric:        Behavior: Behavior normal.        Thought Content: Thought content normal.        Judgment: Judgment normal.      Lab Results  Component Value Date   WBC 12.7 (H) 09/14/2019   HGB 12.7 09/14/2019   HCT 39.9  09/14/2019   MCV 91.1 09/14/2019   PLT 182 09/14/2019     Chemistry      Component Value Date/Time   NA 137 09/14/2019 1415   K 4.3 09/14/2019 1415   CL 102 09/14/2019 1415   CO2 27 09/14/2019 1415   BUN 43 (H) 09/14/2019 1415   CREATININE 1.42 (H) 09/14/2019 1415      Component Value Date/Time   CALCIUM 10.4 (H) 09/14/2019 1415   ALKPHOS 45 09/14/2019 1415   AST 12 (L) 09/14/2019 1415   ALT 15 09/14/2019 1415   BILITOT 0.4 09/14/2019 1415       Impression and Plan: Deanna [name] is a very charming 86 year old white female.  Looks like she has carcinoid.  This does not appear to be metastatic.  I would have to think that this is something that is going to be slow growing because of the calcifications.  I would not think that he would ever cause her a problem.  However, it is conceivable that there might be an issue down the road.  She does have other health issues.  I will have to believe that these will be more of a problem for her in the future.  Again, we could certainly treat this with Somatuline or Sandostatin.  Either would be reasonable.  Either would be fairly well tolerated.  Again, she just wants to hold off for right now.  I can understand this.  At some point, we will have to follow this up.  We probably just  need to have a CT scan done.  This can tell us if we are seen significant growth or not.  The IV iron of blood that she got really helped her out.  We will see what her iron studies show.  We will also see what the chromogranin A level is.  I probably would like to get her back right after Labor Day weekend.  If she does decide about any treatment for the carcinoid, we can get her back sooner.  I spent about 40 minutes with she and her daughter and another daughter on the phone.  I answered their questions.  This is all about her quality of life.  We does want to make sure that whenever we do does not reflect negatively on her quality of life.   Volanda Napoleon, MD 8/9/20217:02 AM

## 2019-10-19 ENCOUNTER — Inpatient Hospital Stay: Payer: Medicare Other | Admitting: Hematology & Oncology

## 2019-10-19 ENCOUNTER — Inpatient Hospital Stay: Payer: Medicare Other

## 2019-11-11 ENCOUNTER — Encounter: Payer: Self-pay | Admitting: Hematology & Oncology

## 2019-11-19 ENCOUNTER — Inpatient Hospital Stay (HOSPITAL_BASED_OUTPATIENT_CLINIC_OR_DEPARTMENT_OTHER): Payer: Medicare Other | Admitting: Hematology & Oncology

## 2019-11-19 ENCOUNTER — Other Ambulatory Visit: Payer: Self-pay

## 2019-11-19 ENCOUNTER — Telehealth: Payer: Self-pay | Admitting: Hematology & Oncology

## 2019-11-19 ENCOUNTER — Encounter: Payer: Self-pay | Admitting: Hematology & Oncology

## 2019-11-19 ENCOUNTER — Inpatient Hospital Stay: Payer: Medicare Other | Attending: Hematology & Oncology

## 2019-11-19 VITALS — BP 176/65 | HR 66 | Temp 97.8°F | Resp 16 | Wt 108.0 lb

## 2019-11-19 DIAGNOSIS — C7A010 Malignant carcinoid tumor of the duodenum: Secondary | ICD-10-CM | POA: Diagnosis not present

## 2019-11-19 DIAGNOSIS — D500 Iron deficiency anemia secondary to blood loss (chronic): Secondary | ICD-10-CM | POA: Diagnosis not present

## 2019-11-19 DIAGNOSIS — R1909 Other intra-abdominal and pelvic swelling, mass and lump: Secondary | ICD-10-CM | POA: Diagnosis present

## 2019-11-19 LAB — CBC WITH DIFFERENTIAL (CANCER CENTER ONLY)
Abs Immature Granulocytes: 0.04 10*3/uL (ref 0.00–0.07)
Basophils Absolute: 0.1 10*3/uL (ref 0.0–0.1)
Basophils Relative: 1 %
Eosinophils Absolute: 0.1 10*3/uL (ref 0.0–0.5)
Eosinophils Relative: 1 %
HCT: 38.6 % (ref 36.0–46.0)
Hemoglobin: 12.3 g/dL (ref 12.0–15.0)
Immature Granulocytes: 0 %
Lymphocytes Relative: 36 %
Lymphs Abs: 3.5 10*3/uL (ref 0.7–4.0)
MCH: 29.3 pg (ref 26.0–34.0)
MCHC: 31.9 g/dL (ref 30.0–36.0)
MCV: 91.9 fL (ref 80.0–100.0)
Monocytes Absolute: 0.6 10*3/uL (ref 0.1–1.0)
Monocytes Relative: 7 %
Neutro Abs: 5.3 10*3/uL (ref 1.7–7.7)
Neutrophils Relative %: 55 %
Platelet Count: 193 10*3/uL (ref 150–400)
RBC: 4.2 MIL/uL (ref 3.87–5.11)
RDW: 13.4 % (ref 11.5–15.5)
WBC Count: 9.7 10*3/uL (ref 4.0–10.5)
nRBC: 0 % (ref 0.0–0.2)

## 2019-11-19 LAB — CMP (CANCER CENTER ONLY)
ALT: 9 U/L (ref 0–44)
AST: 14 U/L — ABNORMAL LOW (ref 15–41)
Albumin: 4.4 g/dL (ref 3.5–5.0)
Alkaline Phosphatase: 48 U/L (ref 38–126)
Anion gap: 9 (ref 5–15)
BUN: 34 mg/dL — ABNORMAL HIGH (ref 8–23)
CO2: 27 mmol/L (ref 22–32)
Calcium: 11.1 mg/dL — ABNORMAL HIGH (ref 8.9–10.3)
Chloride: 106 mmol/L (ref 98–111)
Creatinine: 1.32 mg/dL — ABNORMAL HIGH (ref 0.44–1.00)
GFR, Estimated: 36 mL/min — ABNORMAL LOW (ref >60)
Glucose, Bld: 159 mg/dL — ABNORMAL HIGH (ref 70–99)
Potassium: 4 mmol/L (ref 3.5–5.1)
Sodium: 142 mmol/L (ref 135–145)
Total Bilirubin: 0.4 mg/dL (ref 0.3–1.2)
Total Protein: 7.2 g/dL (ref 6.5–8.1)

## 2019-11-19 LAB — RETICULOCYTES
Immature Retic Fract: 14.5 % (ref 2.3–15.9)
RBC.: 4.25 MIL/uL (ref 3.87–5.11)
Retic Count, Absolute: 96 10*3/uL (ref 19.0–186.0)
Retic Ct Pct: 2.3 % (ref 0.4–3.1)

## 2019-11-19 NOTE — Telephone Encounter (Signed)
Appointments scheduled calendar printed per 10/11 los

## 2019-11-19 NOTE — Progress Notes (Signed)
Hematology and Oncology Follow Up Visit  Deanna [name] [id number] 25-Feb-1930 86 y.o. 11/19/2019   Principle Diagnosis:   Abdominal carcinoid  Iron deficiency anemia-blood loss   Current Therapy:    IV iron-Feraheme given on 01/22/2020     Interim History:  Ms. Mosquera is back for follow-up.  She seems to be doing pretty well.  She says she is having some more flushing.  She is having some abdominal discomfort.  She does look like she might be a little bit bloated.  She apparently had an MRI of the abdomen pelvis a week or so ago.  The MRI was actually an angiogram.  This really did not show any type of ischemic issues.  Unfortunately really did not focus on the mesenteric mass.  Her last chromogranin A level was 2436.  I told her we can certainly do treatment with Somatuline.  She has held off on any treatment.  We probably going to have to repeat the CT scan to see how mass looks like.  She has had no fever.  She has had no obvious bleeding.  There is been no leg swelling.  Overall, I would say her performance status is by ECOG 2.     Medications:  Current Outpatient Medications:  .  acetaminophen (TYLENOL) 325 MG tablet, Take 650 mg by mouth 2 (two) times a day. , Disp: , Rfl:  .  aspirin EC 81 MG tablet, Take 81 mg by mouth at bedtime. , Disp: , Rfl:  .  buPROPion (WELLBUTRIN XL) 150 MG 24 hr tablet, Take 150 mg by mouth daily., Disp: , Rfl:  .  carvedilol (COREG) 25 MG tablet, Take 1 tablet (25 mg total) by mouth 2 (two) times daily with a meal., Disp: , Rfl:  .  Cyanocobalamin (VITAMIN B 12 PO), Take 1,000 mcg by mouth daily., Disp: , Rfl:  .  cyclobenzaprine (FLEXERIL) 5 MG tablet, Take 5 mg by mouth every 8 (eight) hours as needed for muscle spasms., Disp: , Rfl:  .  denosumab (PROLIA) 60 MG/ML SOLN injection, Inject 60 mg into the skin every 6 (six) months. Administer in upper arm, thigh, or abdomen, Disp: , Rfl:  .  diclofenac Sodium (VOLTAREN) 1 % GEL, Apply 2 g  topically 4 (four) times daily as needed (painful joints)., Disp: , Rfl:  .  diltiazem (CARDIZEM CD) 180 MG 24 hr capsule, Take 1 capsule (180 mg total) by mouth daily., Disp: , Rfl:  .  escitalopram (LEXAPRO) 10 MG tablet, Take 10 mg by mouth daily., Disp: , Rfl:  .  esomeprazole (NEXIUM) 40 MG capsule, Take 40 mg by mouth daily at 12 noon., Disp: , Rfl:  .  feeding supplement, ENSURE ENLIVE, (ENSURE ENLIVE) LIQD, Take 237 mLs by mouth 3 (three) times daily between meals., Disp: 237 mL, Rfl: 12 .  ferrous sulfate 325 (65 FE) MG tablet, Take 1 tablet (325 mg total) by mouth daily with breakfast., Disp: , Rfl:  .  HUMALOG KWIKPEN 100 UNIT/ML KwikPen, Inject into the skin as directed., Disp: , Rfl:  .  insulin glargine (LANTUS) 100 UNIT/ML injection, Inject 0.05 mLs (5 Units total) into the skin daily., Disp: , Rfl:  .  loperamide (IMODIUM A-D) 2 MG tablet, Take 2 mg by mouth 4 (four) times daily as needed for diarrhea or loose stools., Disp: , Rfl:  .  mesalamine (LIALDA) 1.2 g EC tablet, Take 2.4 g by mouth daily with breakfast., Disp: , Rfl:  .  metFORMIN (  GLUCOPHAGE) 500 MG tablet, Take 500 mg by mouth daily with breakfast., Disp: , Rfl:  .  pregabalin (LYRICA) 75 MG capsule, Take 1 capsule (75 mg total) by mouth 2 (two) times daily., Disp: 10 capsule, Rfl: 0 .  Probiotic Product (PROBIOTIC-10 PO), Take 1 capsule by mouth daily., Disp: , Rfl:  .  rosuvastatin (CRESTOR) 10 MG tablet, Take 10 mg by mouth at bedtime. , Disp: , Rfl:  .  SYMAX-SR 0.375 MG 12 hr tablet, Take 0.375 mg by mouth every 12 (twelve) hours as needed for pain., Disp: , Rfl:  .  traMADol (ULTRAM) 50 MG tablet, Take 50 mg by mouth 3 (three) times daily as needed., Disp: , Rfl:  .  VITAMIN D, CHOLECALCIFEROL, PO, Take 400 Units by mouth daily. , Disp: , Rfl:  .  vitamin E 400 UNIT capsule, Take 400 Units by mouth 2 (two) times a day. , Disp: , Rfl:   Allergies:  Allergies  Allergen Reactions  . Codeine Nausea Only    Past  Medical History, Surgical history, Social history, and Family History were reviewed and updated.  Review of Systems: Review of Systems  Constitutional: Positive for fatigue.  HENT:  Negative.   Eyes: Negative.   Respiratory: Positive for shortness of breath.   Cardiovascular: Positive for leg swelling.  Gastrointestinal: Positive for abdominal pain.  Endocrine: Negative.   Genitourinary: Negative.    Musculoskeletal: Positive for arthralgias and myalgias.  Skin: Negative.   Neurological: Positive for dizziness.  Hematological: Negative.   Psychiatric/Behavioral: Negative.     Physical Exam:  weight is 108 lb (49 kg). Her oral temperature is 97.8 F (36.6 C). Her blood pressure is 176/65 (abnormal) and her pulse is 66. Her respiration is 16 and oxygen saturation is 100%.   Wt Readings from Last 3 Encounters:  11/19/19 108 lb (49 kg)  09/14/19 104 lb 6.4 oz (47.4 kg)  08/28/19 106 lb 3.2 oz (48.2 kg)    Physical Exam Vitals reviewed.  HENT:     Head: Normocephalic and atraumatic.  Eyes:     Pupils: Pupils are equal, round, and reactive to light.  Cardiovascular:     Rate and Rhythm: Normal rate and regular rhythm.     Heart sounds: Normal heart sounds.     Comments: Cardiac exam shows a regular rate and rhythm with occasional extra beat.  She has a 1/6 systolic ejection murmur. Pulmonary:     Effort: Pulmonary effort is normal.     Breath sounds: Normal breath sounds.  Abdominal:     General: Bowel sounds are normal.     Palpations: Abdomen is soft.     Comments: Her abdominal exam is soft.  Bowel sounds are present.  There is no obvious fluid wave.  There is no obvious abdominal mass.  Musculoskeletal:        General: No tenderness or deformity. Normal range of motion.     Cervical back: Normal range of motion.     Comments: Extremities shows some symmetric weakness in upper and lower extremities.  She has some arthritic changes in her joints.  She has decent pulses in  her distal extremities.  She has some trace edema in her lower legs.  Lymphadenopathy:     Cervical: No cervical adenopathy.  Skin:    General: Skin is warm and dry.     Findings: No erythema or rash.  Neurological:     Mental Status: She is alert and oriented to person, place,  and time.  Psychiatric:        Behavior: Behavior normal.        Thought Content: Thought content normal.        Judgment: Judgment normal.      Lab Results  Component Value Date   WBC 9.7 11/19/2019   HGB 12.3 11/19/2019   HCT 38.6 11/19/2019   MCV 91.9 11/19/2019   PLT 193 11/19/2019     Chemistry      Component Value Date/Time   NA 142 11/19/2019 1352   K 4.0 11/19/2019 1352   CL 106 11/19/2019 1352   CO2 27 11/19/2019 1352   BUN 34 (H) 11/19/2019 1352   CREATININE 1.32 (H) 11/19/2019 1352      Component Value Date/Time   CALCIUM 11.1 (H) 11/19/2019 1352   ALKPHOS 48 11/19/2019 1352   AST 14 (L) 11/19/2019 1352   ALT 9 11/19/2019 1352   BILITOT 0.4 11/19/2019 1352       Impression and Plan: Ms. Scotti is a very charming 86 year old white female.  Looks like she has carcinoid.  This does not appear to be metastatic.  I would have to think that this is something that is going to be slow growing because of the calcifications.    It is hard to say if her symptoms are related to this carcinoid.  The CAT scan will clearly be helpful for [place].  If we see significant growth, then we probably are going to have to think about some kind of treatment for her.  I would probably try Somatuline.  Quality of life is what is important.  At this is our priority.  I will plan to see her back in 4 weeks.  We will see about getting the CAT scan in a week or so.    Volanda Napoleon, MD 10/11/20212:46 PM

## 2019-11-20 ENCOUNTER — Ambulatory Visit (HOSPITAL_BASED_OUTPATIENT_CLINIC_OR_DEPARTMENT_OTHER)
Admission: RE | Admit: 2019-11-20 | Discharge: 2019-11-20 | Disposition: A | Discharge status: Home / Self Care | Payer: Medicare Other | Source: Ambulatory Visit | Attending: Hematology & Oncology | Admitting: Hematology & Oncology

## 2019-11-20 DIAGNOSIS — C7A010 Malignant carcinoid tumor of the duodenum: Secondary | ICD-10-CM

## 2019-11-20 LAB — IRON AND TIBC
Iron: 82 ug/dL (ref 41–142)
Saturation Ratios: 25 % (ref 21–57)
TIBC: 325 ug/dL (ref 236–444)
UIBC: 243 ug/dL (ref 120–384)

## 2019-11-20 LAB — CHROMOGRANIN A: Chromogranin A (ng/mL): 1201 ng/mL — ABNORMAL HIGH (ref 0.0–101.8)

## 2019-11-20 LAB — FERRITIN: Ferritin: 154 ng/mL (ref 11–307)

## 2019-11-20 LAB — ERYTHROPOIETIN: Erythropoietin: 10.6 m[IU]/mL (ref 2.6–18.5)

## 2019-11-21 ENCOUNTER — Encounter: Payer: Self-pay | Admitting: *Deleted

## 2019-11-29 ENCOUNTER — Encounter: Payer: Self-pay | Admitting: Cardiovascular Disease

## 2019-11-29 ENCOUNTER — Other Ambulatory Visit: Payer: Self-pay

## 2019-11-29 ENCOUNTER — Ambulatory Visit (INDEPENDENT_AMBULATORY_CARE_PROVIDER_SITE_OTHER): Payer: Medicare Other | Admitting: Cardiovascular Disease

## 2019-11-29 VITALS — BP 150/80 | HR 66 | Ht 61.0 in | Wt 107.4 lb

## 2019-11-29 DIAGNOSIS — I4891 Unspecified atrial fibrillation: Secondary | ICD-10-CM | POA: Diagnosis not present

## 2019-11-29 DIAGNOSIS — I4819 Other persistent atrial fibrillation: Secondary | ICD-10-CM | POA: Diagnosis not present

## 2019-11-29 DIAGNOSIS — I10 Essential (primary) hypertension: Secondary | ICD-10-CM

## 2019-11-29 MED ORDER — AMLODIPINE BESYLATE 5 MG PO TABS: 5.0000 mg | ORAL_TABLET | Freq: Every day | ORAL | 3 refills | Status: DC

## 2019-11-29 NOTE — Patient Instructions (Addendum)
Medication Instructions:  STOP DILTIAZEM   START AMLODIPINE 5 MG DAILY   STOP ASPIRIN   *If you need a refill on your cardiac medications before your next appointment, please call your pharmacy*  Lab Work: NONE   Testing/Procedures: NONE  Follow-Up: At [facility], you and your health needs are our priority.  As part of our continuing mission to provide you with exceptional heart care, we have created designated Provider Care Teams.  These Care Teams include your primary Cardiologist (physician) and Advanced Practice Providers (APPs -  Physician Assistants and Nurse Practitioners) who all work together to provide you with the care you need, when you need it.  We recommend signing up for the patient portal called "MyChart".  Sign up information is provided on this After Visit Summary.  MyChart is used to connect with patients for Virtual Visits (Telemedicine).  Patients are able to view lab/test results, encounter notes, upcoming appointments, etc.  Non-urgent messages can be sent to your provider as well.   To learn more about what you can do with MyChart, go to [email or web address].    Your next appointment:   2-3 month(s)  The format for your next appointment:   In Person  Provider:   You may see [name], MD or one of the following Advanced Practice Providers on your designated Care Team:    Kerin Ransom, PA-C  [place], [place]  Coletta Memos, [place]  Other Instructions MONITOR AND LOG YOUR BLOOD PRESSURE DAILY, BRING READINGS TO YOUR FOLLOW UP VISIT

## 2019-11-29 NOTE — Progress Notes (Signed)
.    Cardiology Office Note   Date:  11/29/2019   ID:  Deanna [name], DOB 04-Feb-1931, MRN [id number]  PCP:  Deanna Redwood, MD  Cardiologist:   Deanna Latch, MD   No chief complaint on file.   History of Present Illness: Deanna [name] is a 86 y.o. female with paroxysmal atrial fibrillation, chronic left bundle branch block, diabetes, likely carcinoid, and pulmonary hypertension who presents for follow-up.  She was first diagnosed with atrial fibrillation 07/2019 in the setting of sepsis and colitis.  Her heart rates were controlled and she was started on Eliquis.  Echo that admission revealed LVEF 60 to 65% with moderate LVH and indeterminate diastolic function.  However she developed GI bleeding requiring transfusions her Eliquis was discontinued.  She was subsequently hospitalized at [facility] 08/2019 with a transfusion reaction.  She followed up with atrial fibrillation clinic 08/2019 and was maintaining sinus rhythm.  She has also struggled with symptoms of carcinoid.  She was found to have a mesenteric mass and elevated Chromogranin A levels.  She follows with Dr. Marin Olp for this.  She was treated with a round of steroids and her diarrhea has improved.  She continues to have occasional flushing.  Lately [name] has been feeling well.  She hasn't noted any palpitaitons.  She feels tired more than she thinks she should and wants to nap.  This has been intermittent since leaving rehab.  She tries to get some exercise and does PT.  She reports two episodes of collapsing two weeks ago.  Both episodes occurred on the same day.  She denies preceding chest pain, shortness of breath or palpitations.  She didn't lose consciousness.  Her daughter notes that she was more active than usual at the time and it was very hot outside.  Her stomach was bothering her at the time.  She reports feeling weak and shaky.  She follows with Dr. Earlean [name].  When she checks her BP at home it ranges 127-156/70-80.   Her glucose has been improving.   Past Medical History:  Diagnosis Date  . Arthritis    all over  . Cancer ([place])    breast  . Depression   . Diabetes mellitus without complication (HCC)    diet controlled  . Goals of care, counseling/discussion 08/17/2019  . Hypertension   . Iron deficiency anemia due to chronic blood loss 08/17/2019  . Lower GI bleed 08/17/2019  . 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 APPENDECTOMY     with lower back surgery  . BACK SURGERY     x2  . BIOPSY  08/02/2019   Procedure: BIOPSY;  Surgeon: Deanna Flock, MD;  Location: [facility];  Service: Gastroenterology;;  . BREAST SURGERY Left 1995   complete left mastectomy  . CERVICAL SPINE SURGERY    . COLONOSCOPY WITH PROPOFOL N/A 08/02/2019   Procedure: COLONOSCOPY WITH PROPOFOL;  Surgeon: Deanna Flock, MD;  Location: [facility];  Service: Gastroenterology;  Laterality: N/A;  . EYE SURGERY Bilateral    cataracts  . TONSILLECTOMY     at age 86     Current Outpatient Medications  Medication Sig Dispense Refill  . acetaminophen (TYLENOL) 325 MG tablet Take 650 mg by mouth 2 (two) times a day.     Marland Kitchen buPROPion (WELLBUTRIN XL) 150 MG 24 hr tablet Take 150 mg by mouth daily.    . carvedilol (COREG) 25  MG tablet Take 1 tablet (25 mg total) by mouth 2 (two) times daily with a meal.    . Cyanocobalamin (VITAMIN B 12 PO) Take 1,000 mcg by mouth daily.    . cyclobenzaprine (FLEXERIL) 5 MG tablet Take 5 mg by mouth every 8 (eight) hours as needed for muscle spasms.    Marland Kitchen denosumab (PROLIA) 60 MG/ML SOLN injection Inject 60 mg into the skin every 6 (six) months. Administer in upper arm, thigh, or abdomen    . diclofenac Sodium (VOLTAREN) 1 % GEL Apply 2 g topically 4 (four) times daily as needed (painful joints).    . escitalopram (LEXAPRO) 10 MG tablet Take 10 mg by mouth daily.    Marland Kitchen esomeprazole (NEXIUM) 40 MG capsule Take 40 mg  by mouth daily at 12 noon.    . feeding supplement, ENSURE ENLIVE, (ENSURE ENLIVE) LIQD Take 237 mLs by mouth 3 (three) times daily between meals. 237 mL 12  . ferrous sulfate 325 (65 FE) MG tablet Take 1 tablet (325 mg total) by mouth daily with breakfast.    . HUMALOG KWIKPEN 100 UNIT/ML KwikPen Inject into the skin as directed.    . insulin glargine (LANTUS) 100 UNIT/ML injection Inject 0.05 mLs (5 Units total) into the skin daily.    Marland Kitchen loperamide (IMODIUM A-D) 2 MG tablet Take 2 mg by mouth 4 (four) times daily as needed for diarrhea or loose stools.    . mesalamine (LIALDA) 1.2 g EC tablet Take 2.4 g by mouth daily with breakfast.    . metFORMIN (GLUCOPHAGE) 500 MG tablet Take 500 mg by mouth daily with breakfast.    . pregabalin (LYRICA) 75 MG capsule Take 1 capsule (75 mg total) by mouth 2 (two) times daily. 10 capsule 0  . Probiotic Product (PROBIOTIC-10 PO) Take 1 capsule by mouth daily.    . rosuvastatin (CRESTOR) 10 MG tablet Take 10 mg by mouth at bedtime.     Marland Kitchen SYMAX-SR 0.375 MG 12 hr tablet Take 0.375 mg by mouth every 12 (twelve) hours as needed for pain.    . traMADol (ULTRAM) 50 MG tablet Take 50 mg by mouth 3 (three) times daily as needed.    Marland Kitchen VITAMIN D, CHOLECALCIFEROL, PO Take 400 Units by mouth daily.     . vitamin E 400 UNIT capsule Take 400 Units by mouth 2 (two) times a day.     Marland Kitchen amLODipine (NORVASC) 5 MG tablet Take 1 tablet (5 mg total) by mouth daily. 90 tablet 3   No current facility-administered medications for this visit.    Allergies:   Codeine    Social History:  The patient  reports that she has never smoked. She has never used smokeless tobacco. She reports current alcohol use of about 1.0 standard drink of alcohol per week. She reports that she does not use drugs.   Family History:  The patient's family history includes Heart disease in her father and mother; Hypertension in her father and mother.    ROS:  Please see the history of present illness.    Otherwise, review of systems are positive for none.   All other systems are reviewed and negative.    PHYSICAL EXAM: VS:  BP (!) 150/80   Pulse 66   Ht 5\' 1"  (1.549 m)   Wt 107 lb 6.4 oz (48.7 kg)   SpO2 95%   BMI 20.29 kg/m  , BMI Body mass index is 20.29 kg/m. GENERAL:  Well appearing HEENT:  Pupils  equal round and reactive, fundi not visualized, oral mucosa unremarkable NECK:  No jugular venous distention, waveform within normal limits, carotid upstroke brisk and symmetric, no bruits, no thyromegaly LYMPHATICS:  No cervical adenopathy LUNGS:  Clear to auscultation bilaterally HEART:  RRR.  PMI not displaced or sustained,S1 and S2 within normal limits, no S3, no S4, no clicks, no rubs, II/VI systolic murmur ABD:  Flat, positive bowel sounds normal in frequency in pitch, no bruits, no rebound, no guarding, no midline pulsatile mass, no hepatomegaly, no splenomegaly EXT:  2 plus pulses throughout, no edema, no cyanosis no clubbing SKIN:  No rashes no nodules NEURO:  Cranial nerves II through XII grossly intact, motor grossly intact throughout PSYCH:  Cognitively intact, oriented to person place and time   EKG:  EKG is ordered today. The ekg ordered today demonstrates sinus rhythm.  Rate 66 bpm.  LBBB.  R axis deviation  Echo 07/31/19  1. Left ventricular ejection fraction, by estimation, is 60 to 65%. The  left ventricle has normal function. The left ventricle has no regional  wall motion abnormalities. There is moderate concentric left ventricular  hypertrophy. Left ventricular  diastolic parameters are indeterminate. Elevated left ventricular  end-diastolic pressure.  2. Right ventricular systolic function is normal. The right ventricular  size is normal. There is mildly elevated pulmonary artery systolic  pressure. The estimated right ventricular systolic pressure is 58.5 mmHg.  3. The mitral valve is normal in structure. Mild mitral valve  regurgitation. No evidence of  mitral stenosis.  4. Tricuspid valve regurgitation is moderate.  5. The aortic valve is tricuspid. Aortic valve regurgitation is not  visualized. Mild to moderate aortic valve sclerosis/calcification is  present, without any evidence of aortic stenosis.  6. The inferior vena cava is normal in size with greater than 50%  respiratory variability, suggesting right atrial pressure of 3 mmHg.    Recent Labs: 07/30/2019: TSH 0.845 07/31/2019: B Natriuretic Peptide 874.8 08/02/2019: Magnesium 1.7 11/19/2019: ALT 9; BUN 34; Creatinine 1.32; Hemoglobin 12.3; Platelet Count 193; Potassium 4.0; Sodium 142    Lipid Panel No results found for: CHOL, TRIG, HDL, CHOLHDL, VLDL, LDLCALC, LDLDIRECT    Wt Readings from Last 3 Encounters:  11/29/19 107 lb 6.4 oz (48.7 kg)  11/19/19 108 lb (49 kg)  09/14/19 104 lb 6.4 oz (47.4 kg)      ASSESSMENT AND PLAN:  # PAF: She had an episode of atrial fibrillation that occurred in the setting of sepsis.  No known recurrent episodes.  She developed a GI bleed and has a known carcinoid tumor.  Therefore we will continue anticoagulation.  She will also stop aspirin as she does not have any ASCVD other than aortic atherosclerosis.  Continue carvedilol.  # Hypertension:  Blood pressure is poorly controlled both here and at home.  Continue carvedilol.  We will stop diltiazem and start amlodipine 5 mg daily.  Have asked her to track her blood pressures at home and bring to follow-up.  Current medicines are reviewed at length with the patient today.  The patient does not have concerns regarding medicines.  The following changes have been made: Switch diltiazem to amlodipine  Labs/ tests ordered today include:   Orders Placed This Encounter  Procedures  . EKG 12-Lead     Disposition:   FU with Lawrencia Mauney C. Oval Linsey, MD, [facility] in 2 months     Signed, Suezette Lafave C. Oval Linsey, MD, [facility]  11/29/2019 6:08 PM    [place]

## 2019-12-19 ENCOUNTER — Inpatient Hospital Stay (HOSPITAL_BASED_OUTPATIENT_CLINIC_OR_DEPARTMENT_OTHER): Payer: Medicare Other | Admitting: Hematology & Oncology

## 2019-12-19 ENCOUNTER — Other Ambulatory Visit: Payer: Self-pay

## 2019-12-19 ENCOUNTER — Encounter: Payer: Self-pay | Admitting: Hematology & Oncology

## 2019-12-19 ENCOUNTER — Inpatient Hospital Stay: Payer: Medicare Other | Attending: Hematology & Oncology

## 2019-12-19 ENCOUNTER — Telehealth: Payer: Self-pay

## 2019-12-19 VITALS — BP 137/60 | HR 58 | Temp 97.5°F | Resp 20 | Wt 107.0 lb

## 2019-12-19 DIAGNOSIS — D500 Iron deficiency anemia secondary to blood loss (chronic): Secondary | ICD-10-CM | POA: Insufficient documentation

## 2019-12-19 DIAGNOSIS — C7A010 Malignant carcinoid tumor of the duodenum: Secondary | ICD-10-CM

## 2019-12-19 DIAGNOSIS — R1909 Other intra-abdominal and pelvic swelling, mass and lump: Secondary | ICD-10-CM | POA: Diagnosis not present

## 2019-12-19 LAB — CBC WITH DIFFERENTIAL (CANCER CENTER ONLY)
Abs Immature Granulocytes: 0.03 10*3/uL (ref 0.00–0.07)
Basophils Absolute: 0.2 10*3/uL — ABNORMAL HIGH (ref 0.0–0.1)
Basophils Relative: 2 %
Eosinophils Absolute: 0.1 10*3/uL (ref 0.0–0.5)
Eosinophils Relative: 1 %
HCT: 39.6 % (ref 36.0–46.0)
Hemoglobin: 12.5 g/dL (ref 12.0–15.0)
Immature Granulocytes: 0 %
Lymphocytes Relative: 39 %
Lymphs Abs: 3.6 10*3/uL (ref 0.7–4.0)
MCH: 29.1 pg (ref 26.0–34.0)
MCHC: 31.6 g/dL (ref 30.0–36.0)
MCV: 92.3 fL (ref 80.0–100.0)
Monocytes Absolute: 0.5 10*3/uL (ref 0.1–1.0)
Monocytes Relative: 6 %
Neutro Abs: 4.8 10*3/uL (ref 1.7–7.7)
Neutrophils Relative %: 52 %
Platelet Count: 177 10*3/uL (ref 150–400)
RBC: 4.29 MIL/uL (ref 3.87–5.11)
RDW: 12.6 % (ref 11.5–15.5)
WBC Count: 9.3 10*3/uL (ref 4.0–10.5)
nRBC: 0 % (ref 0.0–0.2)

## 2019-12-19 LAB — CMP (CANCER CENTER ONLY)
ALT: 10 U/L (ref 0–44)
AST: 14 U/L — ABNORMAL LOW (ref 15–41)
Albumin: 4.4 g/dL (ref 3.5–5.0)
Alkaline Phosphatase: 45 U/L (ref 38–126)
Anion gap: 7 (ref 5–15)
BUN: 33 mg/dL — ABNORMAL HIGH (ref 8–23)
CO2: 25 mmol/L (ref 22–32)
Calcium: 11 mg/dL — ABNORMAL HIGH (ref 8.9–10.3)
Chloride: 105 mmol/L (ref 98–111)
Creatinine: 1.14 mg/dL — ABNORMAL HIGH (ref 0.44–1.00)
GFR, Estimated: 47 mL/min — ABNORMAL LOW (ref >60)
Glucose, Bld: 163 mg/dL — ABNORMAL HIGH (ref 70–99)
Potassium: 4.6 mmol/L (ref 3.5–5.1)
Sodium: 137 mmol/L (ref 135–145)
Total Bilirubin: 0.4 mg/dL (ref 0.3–1.2)
Total Protein: 7.3 g/dL (ref 6.5–8.1)

## 2019-12-19 LAB — LACTATE DEHYDROGENASE: LDH: 147 U/L (ref 98–192)

## 2019-12-19 NOTE — Progress Notes (Signed)
Hematology and Oncology Follow Up Visit  Deanna [name] [id number] 1930/11/13 86 y.o. 12/19/2019   Principle Diagnosis:   Abdominal carcinoid  Iron deficiency anemia-blood loss   Current Therapy:    IV iron-Feraheme given on 01/22/2020     Interim History:  Deanna [name] is back for follow-up.  Overall, she seems to be doing pretty well.  Surprisingly, her last chromogranin A level was down by 50%.  It went down from 2400 down to 1200.  Not sure exactly as to why this was outside of the work of God.  She did have the CT scan done on October 16.  This showed that the mesenteric mass was only 3.3 x 2.8 cm.  She is not having any abdominal pain.  There is no diarrhea.  She has had no cough.  There is no fever.  She has had no leg swelling.  Overall, her performance status is by ECOG 2.   Medications:  Current Outpatient Medications:  .  acetaminophen (TYLENOL) 325 MG tablet, Take 650 mg by mouth 2 (two) times a day. , Disp: , Rfl:  .  amLODipine (NORVASC) 5 MG tablet, Take 1 tablet (5 mg total) by mouth daily., Disp: 90 tablet, Rfl: 3 .  buPROPion (WELLBUTRIN XL) 150 MG 24 hr tablet, Take 150 mg by mouth daily., Disp: , Rfl:  .  carvedilol (COREG) 25 MG tablet, Take 1 tablet (25 mg total) by mouth 2 (two) times daily with a meal., Disp: , Rfl:  .  Cyanocobalamin (VITAMIN B 12 PO), Take 1,000 mcg by mouth daily., Disp: , Rfl:  .  cyclobenzaprine (FLEXERIL) 5 MG tablet, Take 5 mg by mouth every 8 (eight) hours as needed for muscle spasms., Disp: , Rfl:  .  denosumab (PROLIA) 60 MG/ML SOLN injection, Inject 60 mg into the skin every 6 (six) months. Administer in upper arm, thigh, or abdomen, Disp: , Rfl:  .  diclofenac Sodium (VOLTAREN) 1 % GEL, Apply 2 g topically 4 (four) times daily as needed (painful joints)., Disp: , Rfl:  .  escitalopram (LEXAPRO) 10 MG tablet, Take 10 mg by mouth daily., Disp: , Rfl:  .  esomeprazole (NEXIUM) 40 MG capsule, Take 40 mg by mouth daily at 12 noon.,  Disp: , Rfl:  .  feeding supplement, ENSURE ENLIVE, (ENSURE ENLIVE) LIQD, Take 237 mLs by mouth 3 (three) times daily between meals., Disp: 237 mL, Rfl: 12 .  ferrous sulfate 325 (65 FE) MG tablet, Take 1 tablet (325 mg total) by mouth daily with breakfast., Disp: , Rfl:  .  furosemide (LASIX) 20 MG tablet, Take 20 mg by mouth daily., Disp: , Rfl:  .  HUMALOG KWIKPEN 100 UNIT/ML KwikPen, Inject into the skin as directed., Disp: , Rfl:  .  insulin glargine (LANTUS) 100 UNIT/ML injection, Inject 0.05 mLs (5 Units total) into the skin daily. (Patient taking differently: Inject 18 Units into the skin daily. ), Disp: , Rfl:  .  loperamide (IMODIUM A-D) 2 MG tablet, Take 2 mg by mouth 4 (four) times daily as needed for diarrhea or loose stools., Disp: , Rfl:  .  mesalamine (LIALDA) 1.2 g EC tablet, Take 2.4 g by mouth daily with breakfast., Disp: , Rfl:  .  metFORMIN (GLUCOPHAGE) 500 MG tablet, Take 500 mg by mouth daily with breakfast., Disp: , Rfl:  .  pregabalin (LYRICA) 75 MG capsule, Take 1 capsule (75 mg total) by mouth 2 (two) times daily., Disp: 10 capsule, Rfl: 0 .  Probiotic Product (PROBIOTIC-10 PO), Take 1 capsule by mouth daily., Disp: , Rfl:  .  rosuvastatin (CRESTOR) 10 MG tablet, Take 10 mg by mouth at bedtime. , Disp: , Rfl:  .  SYMAX-SR 0.375 MG 12 hr tablet, Take 0.375 mg by mouth every 12 (twelve) hours as needed for pain., Disp: , Rfl:  .  traMADol (ULTRAM) 50 MG tablet, Take 50 mg by mouth 3 (three) times daily as needed., Disp: , Rfl:  .  VITAMIN D, CHOLECALCIFEROL, PO, Take 400 Units by mouth daily. , Disp: , Rfl:  .  vitamin E 400 UNIT capsule, Take 400 Units by mouth 2 (two) times a day. , Disp: , Rfl:   Allergies:  Allergies  Allergen Reactions  . Codeine Nausea Only    Past Medical History, Surgical history, Social history, and Family History were reviewed and updated.  Review of Systems: Review of Systems  Constitutional: Positive for fatigue.  HENT:  Negative.    Eyes: Negative.   Respiratory: Positive for shortness of breath.   Cardiovascular: Positive for leg swelling.  Gastrointestinal: Positive for abdominal pain.  Endocrine: Negative.   Genitourinary: Negative.    Musculoskeletal: Positive for arthralgias and myalgias.  Skin: Negative.   Neurological: Positive for dizziness.  Hematological: Negative.   Psychiatric/Behavioral: Negative.     Physical Exam:  weight is 107 lb (48.5 kg). Her oral temperature is 97.5 F (36.4 C) (abnormal). Her blood pressure is 137/60 and her pulse is 58 (abnormal). Her respiration is 20 and oxygen saturation is 100%.   Wt Readings from Last 3 Encounters:  12/19/19 107 lb (48.5 kg)  11/29/19 107 lb 6.4 oz (48.7 kg)  11/19/19 108 lb (49 kg)    Physical Exam Vitals reviewed.  HENT:     Head: Normocephalic and atraumatic.  Eyes:     Pupils: Pupils are equal, round, and reactive to light.  Cardiovascular:     Rate and Rhythm: Normal rate and regular rhythm.     Heart sounds: Normal heart sounds.     Comments: Cardiac exam shows a regular rate and rhythm with occasional extra beat.  She has a 1/6 systolic ejection murmur. Pulmonary:     Effort: Pulmonary effort is normal.     Breath sounds: Normal breath sounds.  Abdominal:     General: Bowel sounds are normal.     Palpations: Abdomen is soft.     Comments: Her abdominal exam is soft.  Bowel sounds are present.  There is no obvious fluid wave.  There is no obvious abdominal mass.  Musculoskeletal:        General: No tenderness or deformity. Normal range of motion.     Cervical back: Normal range of motion.     Comments: Extremities shows some symmetric weakness in upper and lower extremities.  She has some arthritic changes in her joints.  She has decent pulses in her distal extremities.  She has some trace edema in her lower legs.  Lymphadenopathy:     Cervical: No cervical adenopathy.  Skin:    General: Skin is warm and dry.     Findings: No  erythema or rash.  Neurological:     Mental Status: She is alert and oriented to person, place, and time.  Psychiatric:        Behavior: Behavior normal.        Thought Content: Thought content normal.        Judgment: Judgment normal.      Lab Results  Component Value Date   WBC 9.3 12/19/2019   HGB 12.5 12/19/2019   HCT 39.6 12/19/2019   MCV 92.3 12/19/2019   PLT 177 12/19/2019     Chemistry      Component Value Date/Time   NA 137 12/19/2019 1151   K 4.6 12/19/2019 1151   CL 105 12/19/2019 1151   CO2 25 12/19/2019 1151   BUN 33 (H) 12/19/2019 1151   CREATININE 1.14 (H) 12/19/2019 1151      Component Value Date/Time   CALCIUM 11.0 (H) 12/19/2019 1151   ALKPHOS 45 12/19/2019 1151   AST 14 (L) 12/19/2019 1151   ALT 10 12/19/2019 1151   BILITOT 0.4 12/19/2019 1151       Impression and Plan: Ms. Galdamez is a very charming 86 year old white female.  Looks like she has carcinoid.  This does not appear to be metastatic.  I would have to think that this is something that is going to be slow growing because of the calcifications.    It is hard to say if her symptoms are related to this carcinoid.  We will try to get another CT scan done in December.  I think this would be very reasonable to do.  I am just happy that her quality of life is doing so well right now.  I know that she will have a nice Thanksgiving.    Volanda Napoleon, MD 11/10/20211:33 PM

## 2019-12-19 NOTE — Telephone Encounter (Signed)
appts made and printed for 01/18/20 per 11/10 los... AOM

## 2019-12-20 LAB — IRON AND TIBC
Iron: 68 ug/dL (ref 41–142)
Saturation Ratios: 21 % (ref 21–57)
TIBC: 328 ug/dL (ref 236–444)
UIBC: 259 ug/dL (ref 120–384)

## 2019-12-20 LAB — FERRITIN: Ferritin: 111 ng/mL (ref 11–307)

## 2019-12-21 ENCOUNTER — Telehealth: Payer: Self-pay | Admitting: *Deleted

## 2019-12-21 LAB — CHROMOGRANIN A: Chromogranin A (ng/mL): 2382 ng/mL — ABNORMAL HIGH (ref 0.0–101.8)

## 2019-12-21 NOTE — Telephone Encounter (Signed)
Unable to reach patient, voicemail is full.

## 2019-12-21 NOTE — Telephone Encounter (Signed)
-----   Message from Volanda Napoleon, MD sent at 12/21/2019 10:57 AM EST ----- Call - the Chromogranin A level is back up to 2400.  This could mean activity is increasing with the carcinoid tumor.  Laurey Arrow

## 2020-01-09 ENCOUNTER — Telehealth: Payer: Self-pay

## 2020-01-09 ENCOUNTER — Encounter: Payer: Self-pay | Admitting: Neurology

## 2020-01-09 ENCOUNTER — Ambulatory Visit (INDEPENDENT_AMBULATORY_CARE_PROVIDER_SITE_OTHER): Payer: Medicare Other | Admitting: Neurology

## 2020-01-09 ENCOUNTER — Other Ambulatory Visit: Payer: Self-pay

## 2020-01-09 VITALS — BP 144/64 | HR 59 | Ht 62.0 in | Wt 109.0 lb

## 2020-01-09 DIAGNOSIS — I480 Paroxysmal atrial fibrillation: Secondary | ICD-10-CM

## 2020-01-09 DIAGNOSIS — R519 Headache, unspecified: Secondary | ICD-10-CM

## 2020-01-09 DIAGNOSIS — R351 Nocturia: Secondary | ICD-10-CM

## 2020-01-09 DIAGNOSIS — R0683 Snoring: Secondary | ICD-10-CM

## 2020-01-09 DIAGNOSIS — H35072 Retinal telangiectasis, left eye: Secondary | ICD-10-CM | POA: Diagnosis not present

## 2020-01-09 DIAGNOSIS — H35071 Retinal telangiectasis, right eye: Secondary | ICD-10-CM

## 2020-01-09 NOTE — Patient Instructions (Signed)

## 2020-01-09 NOTE — Progress Notes (Signed)
Subjective:    Patient ID: Deanna [name] is a 86 y.o. female.  HPI     Star Age, MD, PhD [facility] [address], [address] [place], [place] [address]  Dear Dr. Brigitte Pulse,   I saw your patient, Deanna [name], upon your kind request in my sleep clinic today for initial consultation of her sleep disorder, in particular, concern for underlying obstructive sleep apnea.  The patient is accompanied by her Daughter, Jackelyn Poling, today.  As you know, Deanna [name] is an 86 year old right-handed woman with an underlying complex medical history of hypertension, breast cancer with status post left mastectomy and chemotherapy, hyperlipidemia, left bundle branch block, PAF, diabetes, depression, anxiety, degenerative disc disease, mesenteric carcinoid, history of colitis, hyperparathyroidism, status post neck surgery, hospitalization in July 2021 with acute pulmonary edema, acute respiratory failure with hypoxia, acute on chronic diastolic congestive heart failure, who was found to have macular telangiectasias by her ophthalmologist, concerning for underlying obstructive sleep apnea. I reviewed your office note from 11/28/2019.  Her Epworth sleepiness score is 8 out of 24, fatigue severity score is 39 out of 63.  Her daughter reports that she snores some.  She is widowed, she lives alone, she has 3 grown children, 2 daughters, 1 son, no family history of sleep apnea.  She has a cat that does get her up in the middle of the night because he wants to go out.  Sometimes he wakes her up around 3 AM and sometimes around 5 AM.  She has nocturia about once per average night and has had the occasional morning headaches.  She tries to be in bed between 10 and 11 on most nights and rise time to get out of bed is typically around 9 AM.  Her Past Medical History Is Significant For: Past Medical History:  Diagnosis Date  . Arthritis    all over  . Cancer ([place])    breast  . Depression   .  Diabetes mellitus without complication (HCC)    diet controlled  . Goals of care, counseling/discussion 08/17/2019  . Hypertension   . Iron deficiency anemia due to chronic blood loss 08/17/2019  . Lower GI bleed 08/17/2019  . 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 APPENDECTOMY     with lower back surgery  . BACK SURGERY     x2  . BIOPSY  08/02/2019   Procedure: BIOPSY;  Surgeon: Yetta Flock, MD;  Location: [facility];  Service: Gastroenterology;;  . BREAST SURGERY Left 1995   complete left mastectomy  . CERVICAL SPINE SURGERY    . COLONOSCOPY WITH PROPOFOL N/A 08/02/2019   Procedure: COLONOSCOPY WITH PROPOFOL;  Surgeon: Yetta Flock, MD;  Location: [facility];  Service: Gastroenterology;  Laterality: N/A;  . EYE SURGERY Bilateral    cataracts  . TONSILLECTOMY     at age 63    Her Family History Is Significant For: Family History  Problem Relation Age of Onset  . Hypertension Mother   . Heart disease Mother   . Hypertension Father   . Heart disease Father     Her Social History Is Significant For: Social History   Socioeconomic History  . Marital status: Widowed    Spouse name: Not on file  . Number of children: Not on file  . Years of education: Not on file  . Highest education  level: Not on file  Occupational History  . Not on file  Tobacco Use  . Smoking status: Never Smoker  . Smokeless tobacco: Never Used  Vaping Use  . Vaping Use: Never used  Substance and Sexual Activity  . Alcohol use: Yes    Alcohol/week: 1.0 standard drink    Types: 1 Glasses of wine per week    Comment: yearly  . Drug use: Never  . Sexual activity: Not on file  Other Topics Concern  . Not on file  Social History Narrative  . Not on file   Social Determinants of Health   Financial Resource Strain:   . Difficulty of Paying Living Expenses: Not on  file  Food Insecurity:   . Worried About Charity fundraiser in the Last Year: Not on file  . Ran Out of Food in the Last Year: Not on file  Transportation Needs:   . Lack of Transportation (Medical): Not on file  . Lack of Transportation (Non-Medical): Not on file  Physical Activity:   . Days of Exercise per Week: Not on file  . Minutes of Exercise per Session: Not on file  Stress:   . Feeling of Stress : Not on file  Social Connections:   . Frequency of Communication with Friends and Family: Not on file  . Frequency of Social Gatherings with Friends and Family: Not on file  . Attends Religious Services: Not on file  . Active Member of Clubs or Organizations: Not on file  . Attends Archivist Meetings: Not on file  . Marital Status: Not on file    Her Allergies Are:  Allergies  Allergen Reactions  . Codeine Nausea Only  :   Her Current Medications Are:  Outpatient Encounter Medications as of 01/09/2020  Medication Sig  . acetaminophen (TYLENOL) 325 MG tablet Take 650 mg by mouth 2 (two) times a day.   Marland Kitchen amLODipine (NORVASC) 5 MG tablet Take 1 tablet (5 mg total) by mouth daily.  Marland Kitchen buPROPion (WELLBUTRIN XL) 150 MG 24 hr tablet Take 150 mg by mouth daily.  . carvedilol (COREG) 25 MG tablet Take 1 tablet (25 mg total) by mouth 2 (two) times daily with a meal.  . Cyanocobalamin (VITAMIN B 12 PO) Take 1,000 mcg by mouth daily.  . cyclobenzaprine (FLEXERIL) 5 MG tablet Take 5 mg by mouth every 8 (eight) hours as needed for muscle spasms.  Marland Kitchen denosumab (PROLIA) 60 MG/ML SOLN injection Inject 60 mg into the skin every 6 (six) months. Administer in upper arm, thigh, or abdomen  . diclofenac Sodium (VOLTAREN) 1 % GEL Apply 2 g topically 4 (four) times daily as needed (painful joints).  . escitalopram (LEXAPRO) 20 MG tablet Take 20 mg by mouth daily.  Marland Kitchen esomeprazole (NEXIUM) 40 MG capsule Take 40 mg by mouth daily at 12 noon.  . feeding supplement, ENSURE ENLIVE, (ENSURE ENLIVE)  LIQD Take 237 mLs by mouth 3 (three) times daily between meals.  . ferrous sulfate 325 (65 FE) MG tablet Take 1 tablet (325 mg total) by mouth daily with breakfast.  . furosemide (LASIX) 20 MG tablet Take 20 mg by mouth daily.  Marland Kitchen HUMALOG KWIKPEN 100 UNIT/ML KwikPen Inject into the skin as directed.  . insulin glargine (LANTUS) 100 UNIT/ML injection Inject 0.05 mLs (5 Units total) into the skin daily. (Patient taking differently: Inject 18 Units into the skin daily. )  . loperamide (IMODIUM A-D) 2 MG tablet Take 2 mg by mouth 4 (  four) times daily as needed for diarrhea or loose stools.  . mesalamine (LIALDA) 1.2 g EC tablet Take 2.4 g by mouth daily with breakfast.  . metFORMIN (GLUCOPHAGE) 500 MG tablet Take 500 mg by mouth daily with breakfast.  . pregabalin (LYRICA) 75 MG capsule Take 1 capsule (75 mg total) by mouth 2 (two) times daily.  . Probiotic Product (PROBIOTIC-10 PO) Take 1 capsule by mouth daily.  . rosuvastatin (CRESTOR) 10 MG tablet Take 10 mg by mouth at bedtime.   Marland Kitchen SYMAX-SR 0.375 MG 12 hr tablet Take 0.375 mg by mouth every 12 (twelve) hours as needed for pain.  . traMADol (ULTRAM) 50 MG tablet Take 50 mg by mouth 3 (three) times daily as needed.  Marland Kitchen VITAMIN D, CHOLECALCIFEROL, PO Take 400 Units by mouth daily.   . vitamin E 400 UNIT capsule Take 400 Units by mouth 2 (two) times a day.   . [DISCONTINUED] escitalopram (LEXAPRO) 10 MG tablet Take 10 mg by mouth daily.   No facility-administered encounter medications on file as of 01/09/2020.  :  Review of Systems:  Out of a complete 14 point review of systems, all are reviewed and negative with the exception of these symptoms as listed below: Review of Systems  Neurological:       Pt presents today to discuss her sleep. Pt has never had a sleep study but does endorse snoring.  Epworth Sleepiness Scale 0= would never doze 1= slight chance of dozing 2= moderate chance of dozing 3= high chance of dozing  Sitting and reading:  1 Watching TV: 1 Sitting inactive in a public place (ex. Theater or meeting): 0 As a passenger in a car for an hour without a break: 2 Lying down to rest in the afternoon: 2 Sitting and talking to someone: 0 Sitting quietly after lunch (no alcohol): 2 In a car, while stopped in traffic: 0 Total: 8     Objective:  Neurological Exam  Physical Exam Physical Examination:   Vitals:   01/09/20 1330  BP: (!) 144/64  Pulse: (!) 59   General Examination: The patient is a very pleasant 86 y.o. female in no acute distress. She appears frail, well-groomed.  HEENT: Normocephalic, atraumatic, pupils are equal, round and reactive to light, extraocular tracking is good without limitation to gaze excursion or nystagmus noted. Hearing is grossly intact. Face is symmetric with normal facial animation. Speech is clear with no dysarthria noted. There is no hypophonia. There is no lip, neck/head, jaw or voice tremor. Neck is supple with full range of passive and active motion. There are no carotid bruits on auscultation. Oropharynx exam reveals: moderate mouth dryness, adequate dental hygiene and mild airway crowding, due to small airway entry, tonsils absent, tongue protrudes centrally and palate elevates symmetrically, Mallampati class III.  Neck circumference of 13 inches.  Chest: Clear to auscultation without wheezing, rhonchi or crackles noted.  Heart: S1+S2+0, regular and normal without murmurs, rubs or gallops noted.   Abdomen: Soft, non-tender and non-distended with normal bowel sounds appreciated on auscultation.  Extremities: There is no pitting edema in the distal lower extremities bilaterally.   Skin: Warm and dry without trophic changes noted.   Musculoskeletal: exam reveals arthritic changes in both hands.   Neurologically:  Mental status: The patient is awake, alert and oriented in all 4 spheres. Her immediate and remote memory, attention, language skills and fund of knowledge are  appropriate. There is no evidence of aphasia, agnosia, apraxia or anomia. Speech is  clear with normal prosody and enunciation. Thought process is linear. Mood is normal and affect is normal.  Cranial nerves II - XII are as described above under HEENT exam.  Motor exam: Thin bulk, global strength of about 4+ out of 5, no tremor noted, Romberg is not tested for safety concerns.  Fine motor skills are grossly intact, cerebellar testing shows no dysmetria or intention tremor.  She has no gait ataxia.   Sensory exam: intact to light touch in the upper and lower extremities.  Gait, station and balance: She stands somewhat slowly, she walks with a 2 wheeled walker, slowly and cautiously but does fairly well.  She turns slowly.  No shuffling noted.   Assessment and Plan:   In summary, Deanna [name] is a very pleasant 86 y.o.-year old female  with an underlying complex medical history of hypertension, breast cancer with status post left mastectomy and chemotherapy, hyperlipidemia, left bundle branch block, PAF, diabetes, depression, anxiety, degenerative disc disease, mesenteric carcinoid, history of colitis, hyperparathyroidism, status post neck surgery, hospitalization in July 2021 with acute pulmonary edema, acute respiratory failure with hypoxia, acute on chronic diastolic congestive heart failure, who presents for evaluation of her sleep disturbance.  There is concern for underlying obstructive sleep apnea.  She does have a history of paroxysmal A. fib and was recently hospitalized with acute on chronic congestive heart failure and pulmonary edema as well as history of pulmonary hypertension and recently when she saw her retina specialist she was found to have macular telangiectasias concerning for underlying obstructive sleep apnea (OSA). I had a long chat with the patient and her daughter about my findings and the diagnosis of OSA, its prognosis and treatment options. We talked about medical treatments,  surgical interventions and non-pharmacological approaches. I explained in particular the risks and ramifications of untreated moderate to severe OSA, especially with respect to developing cardiovascular disease down the Road, including congestive heart failure, difficult to treat hypertension, cardiac arrhythmias, or stroke. Even type 2 diabetes has, in part, been linked to untreated OSA. Symptoms of untreated OSA include daytime sleepiness, memory problems, mood irritability and mood disorder such as depression and anxiety, lack of energy, as well as recurrent headaches, especially morning headaches. We talked about trying to maintain a healthy lifestyle in general, as well as the importance of weight control. We also talked about the importance of good sleep hygiene. I recommended the following at this time: sleep study.  I explained the sleep test procedure to the patient and also outlined possible surgical and non-surgical treatment options of OSA, including the use of a custom-made dental device (which would require a referral to a specialist dentist or oral surgeon), upper airway surgical options, such as traditional UPPP or a novel less invasive surgical option in the form of Inspire hypoglossal nerve stimulation (which would involve a referral to an ENT surgeon). I also explained the CPAP treatment option to the patient, who indicated that she would be willing to try CPAP if the need arises. I explained the importance of being compliant with PAP treatment, not only for insurance purposes but primarily to improve Her symptoms, and for the patient's long term health benefit, including to reduce Her cardiovascular risks. I answered all their questions today and the patient and her daughter were in agreement. I plan to see her back after the sleep study is completed and encouraged them to call with any interim questions, concerns, problems or updates.   Thank you very much for allowing  me to participate in  the care of this nice patient. If I can be of any further assistance to you please do not hesitate to call me at [phone].  Sincerely,   Star Age, MD, PhD

## 2020-01-09 NOTE — Telephone Encounter (Signed)
Please call pt's daughter Jackelyn Poling to schedule the sleep study. Pt's insurance changes 02/09/20. They would like to delay study until after the new insurance is active. This should be scanned under media.

## 2020-01-28 ENCOUNTER — Telehealth: Payer: Self-pay | Admitting: *Deleted

## 2020-01-28 ENCOUNTER — Ambulatory Visit (HOSPITAL_BASED_OUTPATIENT_CLINIC_OR_DEPARTMENT_OTHER): Payer: Medicare Other

## 2020-01-28 ENCOUNTER — Inpatient Hospital Stay: Payer: Medicare Other | Admitting: Hematology & Oncology

## 2020-01-28 ENCOUNTER — Encounter (INDEPENDENT_AMBULATORY_CARE_PROVIDER_SITE_OTHER): Payer: Medicare Other | Admitting: Ophthalmology

## 2020-01-28 ENCOUNTER — Inpatient Hospital Stay: Payer: Medicare Other

## 2020-01-28 NOTE — Telephone Encounter (Signed)
Call received from patient's daughter, [place] to inform Dr. Marin Olp that pt fell last night and will not be able to make her scheduled appts for today.  Pt.'s daughter states that she is on her way to check on patient's status now.  Message sent to scheduling and Dr. Marin Olp notified.

## 2020-01-31 ENCOUNTER — Other Ambulatory Visit (HOSPITAL_COMMUNITY): Payer: Self-pay | Admitting: *Deleted

## 2020-02-04 ENCOUNTER — Ambulatory Visit (HOSPITAL_COMMUNITY)
Admission: RE | Admit: 2020-02-04 | Discharge: 2020-02-04 | Disposition: A | Discharge status: Home / Self Care | Payer: Medicare Other | Source: Ambulatory Visit | Attending: Internal Medicine | Admitting: Internal Medicine

## 2020-02-04 ENCOUNTER — Other Ambulatory Visit: Payer: Self-pay

## 2020-02-04 DIAGNOSIS — M810 Age-related osteoporosis without current pathological fracture: Secondary | ICD-10-CM | POA: Diagnosis present

## 2020-02-04 MED ORDER — DENOSUMAB 60 MG/ML ~~LOC~~ SOSY
PREFILLED_SYRINGE | SUBCUTANEOUS | Status: AC
  Administered 2020-02-04: 60 mg via SUBCUTANEOUS
  Filled 2020-02-04: qty 1

## 2020-02-04 MED ORDER — DENOSUMAB 60 MG/ML ~~LOC~~ SOSY: 60.0000 mg | PREFILLED_SYRINGE | Freq: Once | SUBCUTANEOUS | Status: AC

## 2020-02-07 ENCOUNTER — Inpatient Hospital Stay: Payer: Medicare Other | Attending: Hematology & Oncology

## 2020-02-07 ENCOUNTER — Ambulatory Visit (HOSPITAL_BASED_OUTPATIENT_CLINIC_OR_DEPARTMENT_OTHER)
Admission: RE | Admit: 2020-02-07 | Discharge: 2020-02-07 | Disposition: A | Discharge status: Home / Self Care | Payer: Medicare Other | Source: Ambulatory Visit | Attending: Hematology & Oncology | Admitting: Hematology & Oncology

## 2020-02-07 ENCOUNTER — Other Ambulatory Visit: Payer: Self-pay

## 2020-02-07 ENCOUNTER — Encounter: Payer: Self-pay | Admitting: Hematology & Oncology

## 2020-02-07 ENCOUNTER — Inpatient Hospital Stay (HOSPITAL_BASED_OUTPATIENT_CLINIC_OR_DEPARTMENT_OTHER): Payer: Medicare Other | Admitting: Hematology & Oncology

## 2020-02-07 VITALS — BP 133/61 | HR 58 | Temp 98.0°F | Resp 18 | Ht 62.0 in | Wt 104.2 lb

## 2020-02-07 DIAGNOSIS — C7A010 Malignant carcinoid tumor of the duodenum: Secondary | ICD-10-CM

## 2020-02-07 DIAGNOSIS — R1909 Other intra-abdominal and pelvic swelling, mass and lump: Secondary | ICD-10-CM | POA: Insufficient documentation

## 2020-02-07 DIAGNOSIS — D500 Iron deficiency anemia secondary to blood loss (chronic): Secondary | ICD-10-CM

## 2020-02-07 LAB — CBC WITH DIFFERENTIAL (CANCER CENTER ONLY)
Abs Immature Granulocytes: 0.03 10*3/uL (ref 0.00–0.07)
Basophils Absolute: 0.2 10*3/uL — ABNORMAL HIGH (ref 0.0–0.1)
Basophils Relative: 2 %
Eosinophils Absolute: 0.1 10*3/uL (ref 0.0–0.5)
Eosinophils Relative: 1 %
HCT: 37.8 % (ref 36.0–46.0)
Hemoglobin: 12.1 g/dL (ref 12.0–15.0)
Immature Granulocytes: 0 %
Lymphocytes Relative: 38 %
Lymphs Abs: 3.6 10*3/uL (ref 0.7–4.0)
MCH: 28.9 pg (ref 26.0–34.0)
MCHC: 32 g/dL (ref 30.0–36.0)
MCV: 90.4 fL (ref 80.0–100.0)
Monocytes Absolute: 0.7 10*3/uL (ref 0.1–1.0)
Monocytes Relative: 7 %
Neutro Abs: 5 10*3/uL (ref 1.7–7.7)
Neutrophils Relative %: 52 %
Platelet Count: 214 10*3/uL (ref 150–400)
RBC: 4.18 MIL/uL (ref 3.87–5.11)
RDW: 13.2 % (ref 11.5–15.5)
WBC Count: 9.7 10*3/uL (ref 4.0–10.5)
nRBC: 0 % (ref 0.0–0.2)

## 2020-02-07 LAB — CMP (CANCER CENTER ONLY)
ALT: 9 U/L (ref 0–44)
AST: 13 U/L — ABNORMAL LOW (ref 15–41)
Albumin: 4.4 g/dL (ref 3.5–5.0)
Alkaline Phosphatase: 65 U/L (ref 38–126)
Anion gap: 7 (ref 5–15)
BUN: 40 mg/dL — ABNORMAL HIGH (ref 8–23)
CO2: 25 mmol/L (ref 22–32)
Calcium: 9.9 mg/dL (ref 8.9–10.3)
Chloride: 105 mmol/L (ref 98–111)
Creatinine: 1.3 mg/dL — ABNORMAL HIGH (ref 0.44–1.00)
GFR, Estimated: 40 mL/min — ABNORMAL LOW (ref >60)
Glucose, Bld: 124 mg/dL — ABNORMAL HIGH (ref 70–99)
Potassium: 4.9 mmol/L (ref 3.5–5.1)
Sodium: 137 mmol/L (ref 135–145)
Total Bilirubin: 0.4 mg/dL (ref 0.3–1.2)
Total Protein: 7.6 g/dL (ref 6.5–8.1)

## 2020-02-07 NOTE — Progress Notes (Signed)
Hematology and Oncology Follow Up Visit  Deanna [name] [id number] 05-Nov-1930 86 y.o. 02/07/2020   Principle Diagnosis:   Abdominal carcinoid  Iron deficiency anemia-blood loss   Current Therapy:    IV iron-Feraheme given on 01/22/2020     Interim History:  Deanna [name] is back for follow-up.  She apparently fell a couple weeks ago.  Thankfully, she did not break anything.  Is seem like she may have pulled a muscle on the inner right thigh.  She has she had a mobile x-ray done which did not show any break.  She had a CT scan done today.  This was to evaluate the mesenteric mass.  The mesenteric mass looked pretty stable with a   measurement of 4.4 x 2.0 cm.  There is some coarse calcifications noted.  There is no evidence that this was growing or any type of spread.  Her last chromogranin A  level actually was higher at 2400.  She is not having any diarrhea.  She is having no abdominal pain.  She is eating okay.  There is no nausea or vomiting.  She has had no problems with fever.  She has had no bleeding.  She has had no problems with nausea or vomiting.  Overall, I would say her performance status is ECOG 3.    Medications:  Current Outpatient Medications:  .  acetaminophen (TYLENOL) 325 MG tablet, Take 650 mg by mouth 2 (two) times a day. , Disp: , Rfl:  .  amLODipine (NORVASC) 5 MG tablet, Take 1 tablet (5 mg total) by mouth daily., Disp: 90 tablet, Rfl: 3 .  buPROPion (WELLBUTRIN XL) 150 MG 24 hr tablet, Take 150 mg by mouth daily., Disp: , Rfl:  .  carvedilol (COREG) 25 MG tablet, Take 1 tablet (25 mg total) by mouth 2 (two) times daily with a meal., Disp: , Rfl:  .  Cyanocobalamin (VITAMIN B 12 PO), Take 1,000 mcg by mouth daily., Disp: , Rfl:  .  cyclobenzaprine (FLEXERIL) 5 MG tablet, Take 5 mg by mouth every 8 (eight) hours as needed for muscle spasms., Disp: , Rfl:  .  denosumab (PROLIA) 60 MG/ML SOLN injection, Inject 60 mg into the skin every 6 (six) months.  Administer in upper arm, thigh, or abdomen, Disp: , Rfl:  .  diclofenac Sodium (VOLTAREN) 1 % GEL, Apply 2 g topically 4 (four) times daily as needed (painful joints)., Disp: , Rfl:  .  escitalopram (LEXAPRO) 20 MG tablet, Take 20 mg by mouth daily., Disp: , Rfl:  .  esomeprazole (NEXIUM) 40 MG capsule, Take 40 mg by mouth daily at 12 noon., Disp: , Rfl:  .  feeding supplement, ENSURE ENLIVE, (ENSURE ENLIVE) LIQD, Take 237 mLs by mouth 3 (three) times daily between meals., Disp: 237 mL, Rfl: 12 .  ferrous sulfate 325 (65 FE) MG tablet, Take 1 tablet (325 mg total) by mouth daily with breakfast., Disp: , Rfl:  .  insulin glargine (LANTUS) 100 UNIT/ML injection, Inject 0.05 mLs (5 Units total) into the skin daily. (Patient taking differently: Inject 18 Units into the skin daily.), Disp: , Rfl:  .  loperamide (IMODIUM A-D) 2 MG tablet, Take 2 mg by mouth 4 (four) times daily as needed for diarrhea or loose stools., Disp: , Rfl:  .  mesalamine (LIALDA) 1.2 g EC tablet, Take 2.4 g by mouth daily with breakfast., Disp: , Rfl:  .  metFORMIN (GLUCOPHAGE) 500 MG tablet, Take 500 mg by mouth daily with breakfast.,  Disp: , Rfl:  .  pregabalin (LYRICA) 75 MG capsule, Take 1 capsule (75 mg total) by mouth 2 (two) times daily., Disp: 10 capsule, Rfl: 0 .  Probiotic Product (PROBIOTIC-10 PO), Take 1 capsule by mouth daily., Disp: , Rfl:  .  rosuvastatin (CRESTOR) 10 MG tablet, Take 10 mg by mouth at bedtime. , Disp: , Rfl:  .  SYMAX-SR 0.375 MG 12 hr tablet, Take 0.375 mg by mouth every 12 (twelve) hours as needed for pain., Disp: , Rfl:  .  traMADol (ULTRAM) 50 MG tablet, Take 50 mg by mouth 3 (three) times daily as needed., Disp: , Rfl:  .  VITAMIN D, CHOLECALCIFEROL, PO, Take 400 Units by mouth daily. , Disp: , Rfl:  .  vitamin E 400 UNIT capsule, Take 400 Units by mouth 2 (two) times a day. , Disp: , Rfl:   Allergies:  Allergies  Allergen Reactions  . Codeine Nausea Only    Past Medical History,  Surgical history, Social history, and Family History were reviewed and updated.  Review of Systems: Review of Systems  Constitutional: Positive for fatigue.  HENT:  Negative.   Eyes: Negative.   Respiratory: Positive for shortness of breath.   Cardiovascular: Positive for leg swelling.  Gastrointestinal: Positive for abdominal pain.  Endocrine: Negative.   Genitourinary: Negative.    Musculoskeletal: Positive for arthralgias and myalgias.  Skin: Negative.   Neurological: Positive for dizziness.  Hematological: Negative.   Psychiatric/Behavioral: Negative.     Physical Exam:  height is 5\' 2"  (1.575 m) and weight is 104 lb 3.2 oz (47.3 kg). Her oral temperature is 98 F (36.7 C). Her blood pressure is 133/61 and her pulse is 58 (abnormal). Her respiration is 18 and oxygen saturation is 100%.   Wt Readings from Last 3 Encounters:  02/07/20 104 lb 3.2 oz (47.3 kg)  01/09/20 109 lb (49.4 kg)  12/19/19 107 lb (48.5 kg)    Physical Exam Vitals reviewed.  HENT:     Head: Normocephalic and atraumatic.  Eyes:     Pupils: Pupils are equal, round, and reactive to light.  Cardiovascular:     Rate and Rhythm: Normal rate and regular rhythm.     Heart sounds: Normal heart sounds.     Comments: Cardiac exam shows a regular rate and rhythm with occasional extra beat.  She has a 1/6 systolic ejection murmur. Pulmonary:     Effort: Pulmonary effort is normal.     Breath sounds: Normal breath sounds.  Abdominal:     General: Bowel sounds are normal.     Palpations: Abdomen is soft.     Comments: Her abdominal exam is soft.  Bowel sounds are present.  There is no obvious fluid wave.  There is no obvious abdominal mass.  Musculoskeletal:        General: No tenderness or deformity. Normal range of motion.     Cervical back: Normal range of motion.     Comments: Extremities shows some symmetric weakness in upper and lower extremities.  She has some arthritic changes in her joints.  She has  decent pulses in her distal extremities.  She has some trace edema in her lower legs.  Lymphadenopathy:     Cervical: No cervical adenopathy.  Skin:    General: Skin is warm and dry.     Findings: No erythema or rash.  Neurological:     Mental Status: She is alert and oriented to person, place, and time.  Psychiatric:  Behavior: Behavior normal.        Thought Content: Thought content normal.        Judgment: Judgment normal.      Lab Results  Component Value Date   WBC 9.7 02/07/2020   HGB 12.1 02/07/2020   HCT 37.8 02/07/2020   MCV 90.4 02/07/2020   PLT 214 02/07/2020     Chemistry      Component Value Date/Time   NA 137 02/07/2020 1039   K 4.9 02/07/2020 1039   CL 105 02/07/2020 1039   CO2 25 02/07/2020 1039   BUN 40 (H) 02/07/2020 1039   CREATININE 1.30 (H) 02/07/2020 1039      Component Value Date/Time   CALCIUM 9.9 02/07/2020 1039   ALKPHOS 65 02/07/2020 1039   AST 13 (L) 02/07/2020 1039   ALT 9 02/07/2020 1039   BILITOT 0.4 02/07/2020 1039       Impression and Plan: Ms. Calia is a very charming 86 year old white female.  Looks like she has carcinoid.  This does not appear to be metastatic.  I would have to think that this is something that is going to be slow growing because of the calcifications.    I am glad that the CT scan did not show anything that was obvious with respect to spread or growth.  For right now, we will have to see what the chromogranin A level is.  She has been offered Somatuline or somatostatin.  She has declined this.  I would think that another CAT scan could be done in about 4 months or so.  I will see her back in 2 months.  As always, she comes with her daughter who does a great job.   Volanda Napoleon, MD 12/30/202112:26 PM

## 2020-02-12 ENCOUNTER — Telehealth: Payer: Self-pay

## 2020-02-12 LAB — CHROMOGRANIN A: Chromogranin A (ng/mL): 1078 ng/mL — ABNORMAL HIGH (ref 0.0–101.8)

## 2020-02-12 NOTE — Telephone Encounter (Signed)
LVM with daughter for pt to call me back to schedule sleep study

## 2020-02-13 ENCOUNTER — Encounter: Payer: Self-pay | Admitting: *Deleted

## 2020-02-14 ENCOUNTER — Ambulatory Visit: Payer: Medicare Other | Admitting: Cardiovascular Disease

## 2020-03-26 ENCOUNTER — Ambulatory Visit (INDEPENDENT_AMBULATORY_CARE_PROVIDER_SITE_OTHER): Payer: Medicare Other | Admitting: Orthopaedic Surgery

## 2020-03-26 ENCOUNTER — Ambulatory Visit: Payer: Self-pay

## 2020-03-26 ENCOUNTER — Encounter: Payer: Self-pay | Admitting: Orthopaedic Surgery

## 2020-03-26 VITALS — BP 150/71 | HR 59 | Ht 62.0 in | Wt 104.0 lb

## 2020-03-26 DIAGNOSIS — M25551 Pain in right hip: Secondary | ICD-10-CM | POA: Diagnosis not present

## 2020-03-26 DIAGNOSIS — M4322 Fusion of spine, cervical region: Secondary | ICD-10-CM

## 2020-03-26 DIAGNOSIS — M542 Cervicalgia: Secondary | ICD-10-CM | POA: Diagnosis not present

## 2020-03-26 DIAGNOSIS — R102 Pelvic and perineal pain: Secondary | ICD-10-CM

## 2020-03-26 DIAGNOSIS — M4722 Other spondylosis with radiculopathy, cervical region: Secondary | ICD-10-CM

## 2020-03-26 NOTE — Progress Notes (Signed)
Office Visit Note   Patient: Deanna [name]           Date of Birth: Mar 08, 1930           MRN: [id number] Visit Date: 03/26/2020              Requested by: Marton Redwood, MD [address] [place],  [place] [id number] PCP: Marton Redwood, MD   Assessment & Plan: Visit Diagnoses:  1. Neck pain   2. Pain in right hip   3. Pelvic pain   4. Fusion of spine of cervical region   5. Other spondylosis with radiculopathy, cervical region          Plan: We will place patient in a soft cervical collar she can use intermittently.  She can continue ambulation with a cane fall prevention discussed.  She understands that she should get progressive healing of the pelvic fracture and it should become significantly less symptomatic with time.  Recheck 1 month.  Follow-Up Instructions: No follow-ups on file.   Orders:  Orders Placed This Encounter  Procedures  . XR HIP UNILAT W OR W/O PELVIS 2-3 VIEWS RIGHT  . XR Cervical Spine 2 or 3 views   No orders of the defined types were placed in this encounter.     Procedures: No procedures performed   Clinical Data: No additional findings.   Subjective: Chief Complaint  Patient presents with  . Neck - Pain  . Right Hip - Pain    HPI 87 year old female who I did previous lumbar decompression surgery many years ago and also had cervical fusion by Dr. Carloyn Manner C4-C7 presents with a history of fall about 01/26/2021 with verbal x-rays taken at the facility which she was told was negative with persistent pain medially in the right groin.  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  She had continued pain in her neck flared since the fall.  Fusion by Dr. Carloyn Manner was 2008.  She had imaging studies 8 or 9 years after that which showed some adjacent level degenerative changes at C3-4.  Patient's had pain in her neck that radiates into her shoulders.  Persistent problems with pain right medial groin follows the  adductor compartment.  Review of Systems positive for carcinoid tumor of the duodenum.  Previous lumbar cervical surgery as described above all the systems noncontributory to HPI.   Objective: Vital Signs: BP (!) 150/71   Pulse (!) 59   Ht 5\' 2"  (1.575 m)   Wt 104 lb (47.2 kg)   BMI 19.02 kg/m   Physical Exam Constitutional:      Appearance: She is well-developed.     Comments: Thin pale ambulatory with a cane right hand  HENT:     Head: Normocephalic.     Right Ear: External ear normal.     Left Ear: External ear normal.  Eyes:     Pupils: Pupils are equal, round, and reactive to light.  Neck:     Thyroid: No thyromegaly.     Trachea: No tracheal deviation.  Cardiovascular:     Rate and Rhythm: Normal rate.  Pulmonary:     Effort: Pulmonary effort is normal.  Abdominal:     Palpations: Abdomen is soft.  Skin:    General: Skin is warm and dry.  Neurological:     Mental Status: She is alert and oriented to person, place, and time.  Psychiatric:  Mood and Affect: Mood and affect normal.        Behavior: Behavior normal.     Ortho Exam patient has tenderness over the inferior pubic rami.  Negative logroll the hips knees reach full extension.  Mild brachial plexus tenderness.  50% decreased cervical rotation.  Negative Spurling.  Upper extremity reflexes are 2+ and symmetrical.  Specialty Comments:  No specialty comments available.  Imaging: XR HIP UNILAT W OR W/O PELVIS 2-3 VIEWS RIGHT  Result Date: 03/26/2020 AP pelvis standing and frog-leg right hip obtained and reviewed.  This shows inferior pubic rami fracture with periosteal healing.  Superior rami is intact on the right.  Posterior ring and left side pelvis is normal. Impression: Subacute right pubic rami fracture with periosteal healing.  XR Cervical Spine 2 or 3 views  Result Date: 03/26/2020 AP lateral cervical spine x-rays demonstrate fusion with plate C4-C7 solid.  C3-4 spondylosis with disc base  narrowing and more prominent anterior spurs and posterior.  C2-3 anterolisthesis. Impression: Previous C4-C7 solid fusion adjacent level degenerative changes above the fusion as described above.    PMFS History: Patient Active Problem List   Diagnosis Date Noted  . Pelvic pain 03/26/2020  . Fusion of spine of cervical region 03/26/2020  . Other spondylosis with radiculopathy, cervical region 03/26/2020  . Malignant carcinoid tumor of duodenum ([facility]) 09/14/2019  . Persistent atrial fibrillation ([facility]) 08/28/2019  . Secondary hypercoagulable state ([facility] [place]) 08/28/2019  . Lower GI bleed 08/17/2019  . Iron deficiency anemia due to chronic blood loss 08/17/2019  . Goals of care, counseling/discussion 08/17/2019  . Essential hypertension 08/06/2019  . CKD (chronic kidney disease), stage III ([facility]) 08/06/2019  . Calcified mesenteric mass 08/06/2019  . Diarrhea   . Acute respiratory failure with hypoxia ([place] [facility])   . Sepsis without acute organ dysfunction ([facility])   . Atrial fibrillation with RVR ([facility]) 07/30/2019  . Colitis 07/27/2019  . Type 2 macular telangiectasis, left 07/26/2019  . Retinal hemorrhage of left eye 07/26/2019  . Type 2 macular telangiectasis, right 07/26/2019  . Vitreomacular adhesion of right eye 07/26/2019  . Vitreomacular adhesion of left eye 07/26/2019  . Loss of appetite 09/23/2016  . AKI (acute kidney injury) ([facility]) 09/23/2016  . Orthostasis 09/23/2016  . Hyponatremia 09/23/2016  . Leukocytosis 09/23/2016  . Normocytic anemia 09/23/2016  . Depression with anxiety 09/23/2016  . Diabetes mellitus type II, non insulin dependent ([facility]) 09/23/2016  . Transaminasemia 09/23/2016  . Dehydration   . Elevated LFTs   . Chronic midline low back pain without sciatica 01/13/2016   Past Medical History:  Diagnosis Date  . Arthritis    all over  . Cancer ([facility])    breast  . Depression   . Diabetes mellitus without complication ([facility])    diet controlled  . Goals of care,  counseling/discussion 08/17/2019  . Hypertension   . Iron deficiency anemia due to chronic blood loss 08/17/2019  . Lower GI bleed 08/17/2019  . Malignant carcinoid tumor of duodenum ([facility]) 09/14/2019    Family History  Problem Relation Age of Onset  . Hypertension Mother   . Heart disease Mother   . Hypertension Father   . Heart disease Father     Past Surgical History:  Procedure Laterality Date  . APPENDECTOMY     with lower back surgery  . BACK SURGERY     x2  . BIOPSY  08/02/2019   Procedure: BIOPSY;  Surgeon: Yetta Flock, MD;  Location: [facility];  Service: Gastroenterology;;  .  BREAST SURGERY Left 1995   complete left mastectomy  . CERVICAL SPINE SURGERY    . COLONOSCOPY WITH PROPOFOL N/A 08/02/2019   Procedure: COLONOSCOPY WITH PROPOFOL;  Surgeon: Yetta Flock, MD;  Location: [facility];  Service: Gastroenterology;  Laterality: N/A;  . EYE SURGERY Bilateral    cataracts  . TONSILLECTOMY     at age 67   Social History   Occupational History  . Not on file  Tobacco Use  . Smoking status: Never Smoker  . Smokeless tobacco: Never Used  Vaping Use  . Vaping Use: Never used  Substance and Sexual Activity  . Alcohol use: Yes    Alcohol/week: 1.0 standard drink    Types: 1 Glasses of wine per week    Comment: yearly  . Drug use: Never  . Sexual activity: Not on file

## 2020-04-02 ENCOUNTER — Ambulatory Visit (INDEPENDENT_AMBULATORY_CARE_PROVIDER_SITE_OTHER): Payer: Medicare Other | Admitting: Ophthalmology

## 2020-04-02 ENCOUNTER — Other Ambulatory Visit: Payer: Self-pay

## 2020-04-02 ENCOUNTER — Encounter (INDEPENDENT_AMBULATORY_CARE_PROVIDER_SITE_OTHER): Payer: Self-pay | Admitting: Ophthalmology

## 2020-04-02 DIAGNOSIS — H43822 Vitreomacular adhesion, left eye: Secondary | ICD-10-CM

## 2020-04-02 DIAGNOSIS — H35072 Retinal telangiectasis, left eye: Secondary | ICD-10-CM

## 2020-04-02 DIAGNOSIS — H35071 Retinal telangiectasis, right eye: Secondary | ICD-10-CM

## 2020-04-02 NOTE — Assessment & Plan Note (Signed)
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

## 2020-04-02 NOTE — Assessment & Plan Note (Signed)
No vitreal foveal traction noted OU

## 2020-04-02 NOTE — Assessment & Plan Note (Signed)
Macular telangiectasis (MAC-TEL), or parafoveal telangiectasis is a condition of "unknown" cause.  Findings in or near the macula (center of vision) consist of microaneurysms (leaking small capillaries), often with leakage of fluid which in the active phase can impact fine discriminatory vision, and in some cases trigger profound scarring in the macula, with severe permanent vision loss.  Standard treatment is observation and periodic examinations to monitor for treatable complications.   The cause  of this condition is "unknown".  However, the practice of Dr. Zadie Rhine has discovered an association with sleep apnea with its nightly periods of low oxygen in the blood stream (hypoxia), retained carbon dioxide (hypercapnia), associated with transient nocturnal hypertensive episodes.   More recently, some patients also been found to have advanced lung disease, whether asthma or COPD, with similar findings.  Dr. Zadie Rhine has been evaluating the association of sleep apnea, nightly hypoxia, and Macular telangiectasis for over 18 years.  Most patients are found to be noncompliant with sleep apnea therapy or testing in the past.  Resumption of CPAP or similar therapy is strongly recommended if ordered in the past.  Upon review of risk factors or findings positive for sleep apnea, more formal, extensive sleep laboratory or home testing, may be recommended.  Numerous patients, proven to have MAC-TEL, have improved or resolved their ey condition promptly, within weeks, of the use of nighttime oxygen supplementation or continuous positive airway pressure (CPAP).  Patient scheduled for sleep study March 23

## 2020-04-02 NOTE — Progress Notes (Signed)
04/02/2020     CHIEF COMPLAINT Patient presents for Retina Follow Up (8 Month F/U OU//Pt c/o decrease near [place] when reading OU gradually over time. Pt denies changes to distance [facility] OU. Pt denies flashes, floaters, or ocular pain OU. Pt is scheduled for sleep study 04/30/20.)   HISTORY OF PRESENT ILLNESS: Deanna [name] is a 87 y.o. female who presents to the clinic today for:   HPI    Retina Follow Up    Patient presents with  Other.  In both eyes.  This started 8 months ago.  Severity is mild.  Duration of 8 months.  Since onset it is gradually worsening. Additional comments: 8 Month F/U OU  Pt c/o decrease near [place] when reading OU gradually over time. Pt denies changes to distance [facility] OU. Pt denies flashes, floaters, or ocular pain OU. Pt is scheduled for sleep study 04/30/20.       Last edited by Rockie Neighbours, [place] on 04/02/2020 11:19 AM. (History)      Referring physician: Marton Redwood, MD [place],  [place] [address]  HISTORICAL INFORMATION:   Selected notes from the MEDICAL RECORD NUMBER    Lab Results  Component Value Date   HGBA1C 6.4 (H) 07/30/2019     CURRENT MEDICATIONS: Current Outpatient Medications (Ophthalmic Drugs)  Medication Sig  . cycloSPORINE (RESTASIS) 0.05 % ophthalmic emulsion Place 1 drop into both eyes 2 (two) times daily.   No current facility-administered medications for this visit. (Ophthalmic Drugs)   Current Outpatient Medications (Other)  Medication Sig  . acetaminophen (TYLENOL) 325 MG tablet Take 650 mg by mouth 2 (two) times a day.   Marland Kitchen amLODipine (NORVASC) 5 MG tablet Take 1 tablet (5 mg total) by mouth daily.  Marland Kitchen buPROPion (WELLBUTRIN XL) 150 MG 24 hr tablet Take 150 mg by mouth daily.  . carvedilol (COREG) 25 MG tablet Take 1 tablet (25 mg total) by mouth 2 (two) times daily with a meal.  . Cyanocobalamin (VITAMIN B 12 PO) Take 1,000 mcg by mouth daily.  . cyclobenzaprine (FLEXERIL) 5 MG tablet Take 5 mg by mouth every 8  (eight) hours as needed for muscle spasms.  Marland Kitchen denosumab (PROLIA) 60 MG/ML SOLN injection Inject 60 mg into the skin every 6 (six) months. Administer in upper arm, thigh, or abdomen  . diclofenac Sodium (VOLTAREN) 1 % GEL Apply 2 g topically 4 (four) times daily as needed (painful joints).  . escitalopram (LEXAPRO) 20 MG tablet Take 20 mg by mouth daily.  Marland Kitchen esomeprazole (NEXIUM) 40 MG capsule Take 40 mg by mouth daily at 12 noon.  . feeding supplement, ENSURE ENLIVE, (ENSURE ENLIVE) LIQD Take 237 mLs by mouth 3 (three) times daily between meals.  . ferrous sulfate 325 (65 FE) MG tablet Take 1 tablet (325 mg total) by mouth daily with breakfast.  . insulin glargine (LANTUS) 100 UNIT/ML injection Inject 0.05 mLs (5 Units total) into the skin daily. (Patient taking differently: Inject 18 Units into the skin daily.)  . loperamide (IMODIUM A-D) 2 MG tablet Take 2 mg by mouth 4 (four) times daily as needed for diarrhea or loose stools.  . mesalamine (LIALDA) 1.2 g EC tablet Take 2.4 g by mouth daily with breakfast.  . metFORMIN (GLUCOPHAGE) 500 MG tablet Take 500 mg by mouth daily with breakfast.  . pregabalin (LYRICA) 75 MG capsule Take 1 capsule (75 mg total) by mouth 2 (two) times daily.  . Probiotic Product (PROBIOTIC-10 PO) Take 1 capsule  by mouth daily.  . rosuvastatin (CRESTOR) 10 MG tablet Take 10 mg by mouth at bedtime.   Marland Kitchen SYMAX-SR 0.375 MG 12 hr tablet Take 0.375 mg by mouth every 12 (twelve) hours as needed for pain.  . traMADol (ULTRAM) 50 MG tablet Take 50 mg by mouth 3 (three) times daily as needed.  Marland Kitchen VITAMIN D, CHOLECALCIFEROL, PO Take 400 Units by mouth daily.   . vitamin E 400 UNIT capsule Take 400 Units by mouth 2 (two) times a day.    No current facility-administered medications for this visit. (Other)      REVIEW OF SYSTEMS:    ALLERGIES Allergies  Allergen Reactions  . Codeine Nausea Only    PAST MEDICAL HISTORY Past Medical History:  Diagnosis Date  . Arthritis     all over  . Cancer ([place])    breast  . Depression   . Diabetes mellitus without complication (HCC)    diet controlled  . Goals of care, counseling/discussion 08/17/2019  . Hypertension   . Iron deficiency anemia due to chronic blood loss 08/17/2019  . Lower GI bleed 08/17/2019  . 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 APPENDECTOMY     with lower back surgery  . BACK SURGERY     x2  . BIOPSY  08/02/2019   Procedure: BIOPSY;  Surgeon: Yetta Flock, MD;  Location: [facility];  Service: Gastroenterology;;  . BREAST SURGERY Left 1995   complete left mastectomy  . CERVICAL SPINE SURGERY    . COLONOSCOPY WITH PROPOFOL N/A 08/02/2019   Procedure: COLONOSCOPY WITH PROPOFOL;  Surgeon: Yetta Flock, MD;  Location: [facility];  Service: Gastroenterology;  Laterality: N/A;  . EYE SURGERY Bilateral    cataracts  . TONSILLECTOMY     at age 5    FAMILY HISTORY Family History  Problem Relation Age of Onset  . Hypertension Mother   . Heart disease Mother   . Hypertension Father   . Heart disease Father     SOCIAL HISTORY Social History   Tobacco Use  . Smoking status: Never Smoker  . Smokeless tobacco: Never Used  Vaping Use  . Vaping Use: Never used  Substance Use Topics  . Alcohol use: Yes    Alcohol/week: 1.0 standard drink    Types: 1 Glasses of wine per week    Comment: yearly  . Drug use: Never         OPHTHALMIC EXAM:  Base Eye Exam    Visual Acuity (ETDRS)      Right Left   Dist cc 20/30 20/30   Dist ph cc NI NI   Correction: Glasses       Tonometry (Tonopen, 11:19 AM)      Right Left   Pressure 18 16       Pupils      Pupils Dark Light Shape React APD   Right PERRL 3 2 Round Brisk None   Left PERRL 3 2 Round Brisk None       Visual Fields (Counting fingers)      Left Right    Full Full       Extraocular Movement      Right Left    Full Full        Neuro/Psych    Oriented x3: Yes   Mood/Affect: Normal       Dilation    Both eyes: 1.0% Mydriacyl, 2.5% Phenylephrine @  11:24 AM        Slit Lamp and Fundus Exam    External Exam      Right Left   External Normal Normal       Slit Lamp Exam      Right Left   Lids/Lashes Normal Normal   Conjunctiva/Sclera White and quiet White and quiet   Cornea Clear Clear   Anterior Chamber Deep and quiet Deep and quiet   Iris Round and reactive Round and reactive   Lens Centered posterior chamber intraocular lens Centered posterior chamber intraocular lens   Anterior Vitreous Normal Normal       Fundus Exam      Right Left   Posterior Vitreous Normal Normal   Disc Normal Normal   C/D Ratio 0.55 0.4   Macula no hemorrhage, right angle venules Microaneurysms temporally   Vessels Normal Normal   Periphery Normal Normal          IMAGING AND PROCEDURES  Imaging and Procedures for 04/02/20  OCT, Retina - OU - Both Eyes       Right Eye Quality was good. Scan locations included subfoveal. Central Foveal Thickness: 286. Progression has been stable. Findings include vitreomacular adhesion , abnormal foveal contour.   Left Eye Quality was good. Scan locations included subfoveal. Central Foveal Thickness: 295. Progression has been stable. Findings include vitreomacular adhesion , abnormal foveal contour.   Notes OU with minor vitreal macular adhesion.  No inner foveal distortion from the attachment sites in either eye.  However the inner foveal of the retina has micro-CME.  This is morphologically slightly different as compared to the visit December 2020 and as compared to June 2021.  This does suggest that this might be ongoing minor MAC-TEL.                  ASSESSMENT/PLAN:  Type 2 macular telangiectasis, right Macular telangiectasis (MAC-TEL), or parafoveal telangiectasis is a condition of "unknown" cause.  Findings in or near the macula (center of vision) consist of  microaneurysms (leaking small capillaries), often with leakage of fluid which in the active phase can impact fine discriminatory vision, and in some cases trigger profound scarring in the macula, with severe permanent vision loss.  Standard treatment is observation and periodic examinations to monitor for treatable complications.   The cause  of this condition is "unknown".  However, the practice of Dr. Zadie Rhine has discovered an association with sleep apnea with its nightly periods of low oxygen in the blood stream (hypoxia), retained carbon dioxide (hypercapnia), associated with transient nocturnal hypertensive episodes.   More recently, some patients also been found to have advanced lung disease, whether asthma or COPD, with similar findings.  Dr. Zadie Rhine has been evaluating the association of sleep apnea, nightly hypoxia, and Macular telangiectasis for over 18 years.  Most patients are found to be noncompliant with sleep apnea therapy or testing in the past.  Resumption of CPAP or similar therapy is strongly recommended if ordered in the past.  Upon review of risk factors or findings positive for sleep apnea, more formal, extensive sleep laboratory or home testing, may be recommended.  Numerous patients, proven to have MAC-TEL, have improved or resolved their ey condition promptly, within weeks, of the use of nighttime oxygen supplementation or continuous positive airway pressure (CPAP).  Patient scheduled for sleep study March 23  Vitreomacular adhesion of left eye No vitreal foveal traction noted OU  Type 2 macular telangiectasis, left Microaneurysms left eye have not  progressed since last visit      ICD-10-CM   1. Type 2 macular telangiectasis, left  H35.072 OCT, Retina - OU - Both Eyes  2. Type 2 macular telangiectasis, right  H35.071 OCT, Retina - OU - Both Eyes  3. Vitreomacular adhesion of left eye  H43.822     1.  Patient is scheduled for sleep study March 23.  We will schedule for  follow-up visit with [place] in 8 weeks to review those tests and results.  2.  No dilation next, OCT only  3.  Ophthalmic Meds Ordered this visit:  No orders of the defined types were placed in this encounter.      Return in about 2 months (around 05/31/2020) for NO DILATE, OCT.  There are no Patient Instructions on file for this visit.   Explained the diagnoses, plan, and follow up with the patient and they expressed understanding.  Patient expressed understanding of the importance of proper follow up care.   Clent Demark Deanna [name] M.D. Diseases & Surgery of the Retina and Vitreous [facility] [place] 04/02/20     Abbreviations: M myopia (nearsighted); A astigmatism; H hyperopia (farsighted); P presbyopia; Mrx spectacle prescription;  CTL contact lenses; OD right eye; OS left eye; OU both eyes  XT exotropia; ET esotropia; PEK punctate epithelial keratitis; PEE punctate epithelial erosions; DES dry eye syndrome; MGD meibomian gland dysfunction; ATs artificial tears; PFAT's preservative free artificial tears; [place] nuclear sclerotic cataract; PSC posterior subcapsular cataract; ERM epi-retinal membrane; PVD posterior vitreous detachment; RD retinal detachment; DM diabetes mellitus; DR diabetic retinopathy; NPDR non-proliferative diabetic retinopathy; PDR proliferative diabetic retinopathy; CSME clinically significant macular edema; DME diabetic macular edema; dbh dot blot hemorrhages; CWS cotton wool spot; POAG primary open angle glaucoma; C/D cup-to-disc ratio; HVF humphrey visual field; GVF goldmann visual field; OCT optical coherence tomography; IOP intraocular pressure; BRVO Branch retinal vein occlusion; CRVO central retinal vein occlusion; CRAO central retinal artery occlusion; BRAO branch retinal artery occlusion; RT retinal tear; SB scleral buckle; PPV pars plana vitrectomy; VH Vitreous hemorrhage; PRP panretinal laser photocoagulation; IVK intravitreal kenalog; VMT vitreomacular  traction; MH Macular hole;  NVD neovascularization of the disc; NVE neovascularization elsewhere; AREDS age related eye disease study; ARMD age related macular degeneration; POAG primary open angle glaucoma; EBMD epithelial/anterior basement membrane dystrophy; ACIOL anterior chamber intraocular lens; IOL intraocular lens; PCIOL posterior chamber intraocular lens; Phaco/IOL phacoemulsification with intraocular lens placement; [place] photorefractive keratectomy; LASIK laser assisted in situ keratomileusis; HTN hypertension; DM diabetes mellitus; COPD chronic obstructive pulmonary disease

## 2020-04-08 ENCOUNTER — Other Ambulatory Visit: Payer: Self-pay

## 2020-04-08 ENCOUNTER — Telehealth: Payer: Self-pay

## 2020-04-08 ENCOUNTER — Encounter: Payer: Self-pay | Admitting: Hematology & Oncology

## 2020-04-08 ENCOUNTER — Inpatient Hospital Stay: Payer: Medicare Other | Attending: Hematology & Oncology

## 2020-04-08 ENCOUNTER — Inpatient Hospital Stay (HOSPITAL_BASED_OUTPATIENT_CLINIC_OR_DEPARTMENT_OTHER): Payer: Medicare Other | Admitting: Hematology & Oncology

## 2020-04-08 VITALS — BP 167/58 | HR 58 | Temp 97.8°F | Resp 18 | Wt 105.8 lb

## 2020-04-08 DIAGNOSIS — C7A010 Malignant carcinoid tumor of the duodenum: Secondary | ICD-10-CM

## 2020-04-08 DIAGNOSIS — R1900 Intra-abdominal and pelvic swelling, mass and lump, unspecified site: Secondary | ICD-10-CM | POA: Diagnosis not present

## 2020-04-08 DIAGNOSIS — D500 Iron deficiency anemia secondary to blood loss (chronic): Secondary | ICD-10-CM | POA: Insufficient documentation

## 2020-04-08 LAB — CMP (CANCER CENTER ONLY)
ALT: 14 U/L (ref 0–44)
AST: 16 U/L (ref 15–41)
Albumin: 4.4 g/dL (ref 3.5–5.0)
Alkaline Phosphatase: 53 U/L (ref 38–126)
Anion gap: 6 (ref 5–15)
BUN: 29 mg/dL — ABNORMAL HIGH (ref 8–23)
CO2: 27 mmol/L (ref 22–32)
Calcium: 10.6 mg/dL — ABNORMAL HIGH (ref 8.9–10.3)
Chloride: 107 mmol/L (ref 98–111)
Creatinine: 1.18 mg/dL — ABNORMAL HIGH (ref 0.44–1.00)
GFR, Estimated: 45 mL/min — ABNORMAL LOW (ref >60)
Glucose, Bld: 134 mg/dL — ABNORMAL HIGH (ref 70–99)
Potassium: 4.5 mmol/L (ref 3.5–5.1)
Sodium: 140 mmol/L (ref 135–145)
Total Bilirubin: 0.4 mg/dL (ref 0.3–1.2)
Total Protein: 7.3 g/dL (ref 6.5–8.1)

## 2020-04-08 LAB — CBC WITH DIFFERENTIAL (CANCER CENTER ONLY)
Abs Immature Granulocytes: 0.02 10*3/uL (ref 0.00–0.07)
Basophils Absolute: 0.1 10*3/uL (ref 0.0–0.1)
Basophils Relative: 2 %
Eosinophils Absolute: 0.2 10*3/uL (ref 0.0–0.5)
Eosinophils Relative: 2 %
HCT: 38.1 % (ref 36.0–46.0)
Hemoglobin: 11.9 g/dL — ABNORMAL LOW (ref 12.0–15.0)
Immature Granulocytes: 0 %
Lymphocytes Relative: 46 %
Lymphs Abs: 3.7 10*3/uL (ref 0.7–4.0)
MCH: 28.7 pg (ref 26.0–34.0)
MCHC: 31.2 g/dL (ref 30.0–36.0)
MCV: 92 fL (ref 80.0–100.0)
Monocytes Absolute: 0.7 10*3/uL (ref 0.1–1.0)
Monocytes Relative: 9 %
Neutro Abs: 3.3 10*3/uL (ref 1.7–7.7)
Neutrophils Relative %: 41 %
Platelet Count: 172 10*3/uL (ref 150–400)
RBC: 4.14 MIL/uL (ref 3.87–5.11)
RDW: 14.6 % (ref 11.5–15.5)
WBC Count: 8.1 10*3/uL (ref 4.0–10.5)
nRBC: 0 % (ref 0.0–0.2)

## 2020-04-08 LAB — IRON AND TIBC
Iron: 42 ug/dL (ref 41–142)
Saturation Ratios: 14 % — ABNORMAL LOW (ref 21–57)
TIBC: 310 ug/dL (ref 236–444)
UIBC: 268 ug/dL (ref 120–384)

## 2020-04-08 LAB — FERRITIN: Ferritin: 80 ng/mL (ref 11–307)

## 2020-04-08 NOTE — Telephone Encounter (Signed)
appts made and printed for pt per 04/08/20 los, advised to stop by imaging and p/u contrast and we will call once scan is approved   Deanna [name]

## 2020-04-08 NOTE — Progress Notes (Signed)
Hematology and Oncology Follow Up Visit  Deanna [name] [id number] [birth year] 87 y.o. 04/08/2020   Principle Diagnosis:   Abdominal carcinoid  Iron deficiency anemia-blood loss   Current Therapy:    IV iron-Feraheme given on 01/22/2020     Interim History:  Deanna [name] is back for follow-up.  She actually looks quite good.  She really has had no complaints.  Her daughter comes in with her.  I think she did have a fall.  Thankfully she only has had a hairline fracture of the pelvis.  This did not require any kind of surgery.  We last saw her in December, her chromogranin A level was 1078.  She has had some flushing she says.  She has little bit of diarrhea.  She is on some Imodium for this.  She has had no fever.  She has had no bleeding.  Is been no leg swelling.  She has had no rashes.  She is avoided the coronavirus.  Overall, I would say her performance status is probably ECOG 2-3.    Medications:  Current Outpatient Medications:  .  acetaminophen (TYLENOL) 325 MG tablet, Take 650 mg by mouth 2 (two) times a day. , Disp: , Rfl:  .  buPROPion (WELLBUTRIN XL) 150 MG 24 hr tablet, Take 150 mg by mouth daily., Disp: , Rfl:  .  carvedilol (COREG) 25 MG tablet, Take 1 tablet (25 mg total) by mouth 2 (two) times daily with a meal., Disp: , Rfl:  .  Cyanocobalamin (VITAMIN B 12 PO), Take 1,000 mcg by mouth daily., Disp: , Rfl:  .  cycloSPORINE (RESTASIS) 0.05 % ophthalmic emulsion, Place 1 drop into both eyes 2 (two) times daily., Disp: , Rfl:  .  denosumab (PROLIA) 60 MG/ML SOLN injection, Inject 60 mg into the skin every 6 (six) months. Administer in upper arm, thigh, or abdomen, Disp: , Rfl:  .  diclofenac Sodium (VOLTAREN) 1 % GEL, Apply 2 g topically 4 (four) times daily as needed (painful joints)., Disp: , Rfl:  .  escitalopram (LEXAPRO) 20 MG tablet, Take 20 mg by mouth daily., Disp: , Rfl:  .  esomeprazole (NEXIUM) 40 MG capsule, Take 40 mg by mouth daily at 12 noon.,  Disp: , Rfl:  .  feeding supplement, ENSURE ENLIVE, (ENSURE ENLIVE) LIQD, Take 237 mLs by mouth 3 (three) times daily between meals., Disp: 237 mL, Rfl: 12 .  ferrous sulfate 325 (65 FE) MG tablet, Take 1 tablet (325 mg total) by mouth daily with breakfast., Disp: , Rfl:  .  FREESTYLE LITE test strip, , Disp: , Rfl:  .  hydrochlorothiazide (MICROZIDE) 12.5 MG capsule, , Disp: , Rfl:  .  insulin glargine (LANTUS) 100 UNIT/ML injection, Inject 0.05 mLs (5 Units total) into the skin daily. (Patient taking differently: Inject 18 Units into the skin daily.), Disp: , Rfl:  .  Lancets (FREESTYLE) lancets, 1 each daily., Disp: , Rfl:  .  mesalamine (LIALDA) 1.2 g EC tablet, Take 2.4 g by mouth daily with breakfast., Disp: , Rfl:  .  metFORMIN (GLUCOPHAGE) 500 MG tablet, Take 500 mg by mouth daily with breakfast., Disp: , Rfl:  .  NOVOLOG FLEXPEN 100 UNIT/ML FlexPen, Inject into the skin., Disp: , Rfl:  .  pregabalin (LYRICA) 75 MG capsule, Take 1 capsule (75 mg total) by mouth 2 (two) times daily., Disp: 10 capsule, Rfl: 0 .  Probiotic Product (PROBIOTIC-10 PO), Take 1 capsule by mouth daily., Disp: , Rfl:  .  rosuvastatin (  CRESTOR) 10 MG tablet, Take 10 mg by mouth at bedtime. , Disp: , Rfl:  .  SURE COMFORT PEN NEEDLES 31G X 5 MM MISC, , Disp: , Rfl:  .  SYMAX-SR 0.375 MG 12 hr tablet, Take 0.375 mg by mouth every 12 (twelve) hours as needed for pain., Disp: , Rfl:  .  traMADol (ULTRAM) 50 MG tablet, Take 50 mg by mouth 3 (three) times daily as needed., Disp: , Rfl:  .  VITAMIN D, CHOLECALCIFEROL, PO, Take 400 Units by mouth daily. , Disp: , Rfl:  .  vitamin E 400 UNIT capsule, Take 400 Units by mouth 2 (two) times a day. , Disp: , Rfl:  .  amLODipine (NORVASC) 5 MG tablet, Take 1 tablet (5 mg total) by mouth daily., Disp: 90 tablet, Rfl: 3 .  cyclobenzaprine (FLEXERIL) 5 MG tablet, Take 5 mg by mouth every 8 (eight) hours as needed for muscle spasms. (Patient not taking: Reported on 04/08/2020), Disp: ,  Rfl:  .  furosemide (LASIX) 20 MG tablet, Take 20 mg by mouth daily., Disp: , Rfl:  .  loperamide (IMODIUM A-D) 2 MG tablet, Take 2 mg by mouth 4 (four) times daily as needed for diarrhea or loose stools. (Patient not taking: Reported on 04/08/2020), Disp: , Rfl:   Allergies:  Allergies  Allergen Reactions  . Codeine Nausea Only    Past Medical History, Surgical history, Social history, and Family History were reviewed and updated.  Review of Systems: Review of Systems  Constitutional: Positive for fatigue.  HENT:  Negative.   Eyes: Negative.   Respiratory: Positive for shortness of breath.   Cardiovascular: Positive for leg swelling.  Gastrointestinal: Positive for abdominal pain.  Endocrine: Negative.   Genitourinary: Negative.    Musculoskeletal: Positive for arthralgias and myalgias.  Skin: Negative.   Neurological: Positive for dizziness.  Hematological: Negative.   Psychiatric/Behavioral: Negative.     Physical Exam:  weight is 105 lb 12 oz (48 kg). Her oral temperature is 97.8 F (36.6 C). Her blood pressure is 167/58 (abnormal) and her pulse is 58 (abnormal). Her respiration is 18 and oxygen saturation is 99%.   Wt Readings from Last 3 Encounters:  04/08/20 105 lb 12 oz (48 kg)  03/26/20 104 lb (47.2 kg)  02/07/20 104 lb 3.2 oz (47.3 kg)    Physical Exam Vitals reviewed.  HENT:     Head: Normocephalic and atraumatic.  Eyes:     Pupils: Pupils are equal, round, and reactive to light.  Cardiovascular:     Rate and Rhythm: Normal rate and regular rhythm.     Heart sounds: Normal heart sounds.     Comments: Cardiac exam shows a regular rate and rhythm with occasional extra beat.  She has a 1/6 systolic ejection murmur. Pulmonary:     Effort: Pulmonary effort is normal.     Breath sounds: Normal breath sounds.  Abdominal:     General: Bowel sounds are normal.     Palpations: Abdomen is soft.     Comments: Her abdominal exam is soft.  Bowel sounds are present.   There is no obvious fluid wave.  There is no obvious abdominal mass.  Musculoskeletal:        General: No tenderness or deformity. Normal range of motion.     Cervical back: Normal range of motion.     Comments: Extremities shows some symmetric weakness in upper and lower extremities.  She has some arthritic changes in her joints.  She  has decent pulses in her distal extremities.  She has some trace edema in her lower legs.  Lymphadenopathy:     Cervical: No cervical adenopathy.  Skin:    General: Skin is warm and dry.     Findings: No erythema or rash.  Neurological:     Mental Status: She is alert and oriented to person, place, and time.  Psychiatric:        Behavior: Behavior normal.        Thought Content: Thought content normal.        Judgment: Judgment normal.      Lab Results  Component Value Date   WBC 8.1 04/08/2020   HGB 11.9 (L) 04/08/2020   HCT 38.1 04/08/2020   MCV 92.0 04/08/2020   PLT 172 04/08/2020     Chemistry      Component Value Date/Time   NA 140 04/08/2020 0954   K 4.5 04/08/2020 0954   CL 107 04/08/2020 0954   CO2 27 04/08/2020 0954   BUN 29 (H) 04/08/2020 0954   CREATININE 1.18 (H) 04/08/2020 0954      Component Value Date/Time   CALCIUM 10.6 (H) 04/08/2020 0954   ALKPHOS 53 04/08/2020 0954   AST 16 04/08/2020 0954   ALT 14 04/08/2020 0954   BILITOT 0.4 04/08/2020 0954       Impression and Plan: Ms. Whicker is a very charming 87 year old white female.  Looks like she has carcinoid.  This does not appear to be metastatic.  I would have to think that this is something that is going to be slow growing because of the calcifications.    I know her birthday is coming up in 3 weeks.  I am very happy for her.  We will have to do a CT scan when we see her back in 2 months.  This I think would be reasonable.  We will see what her Chromogranin A level is.  This also can help [place] out.  I am just happy that her quality life, overall, seems to be doing  fairly well right now.  She is always fun to talk to.  She is very spunky.   Volanda Napoleon, MD 3/1/202211:34 AM

## 2020-04-09 ENCOUNTER — Ambulatory Visit (INDEPENDENT_AMBULATORY_CARE_PROVIDER_SITE_OTHER): Payer: Medicare Other | Admitting: Cardiovascular Disease

## 2020-04-09 ENCOUNTER — Encounter: Payer: Self-pay | Admitting: Cardiovascular Disease

## 2020-04-09 VITALS — BP 170/68 | HR 64 | Ht 62.0 in | Wt 106.0 lb

## 2020-04-09 DIAGNOSIS — I4819 Other persistent atrial fibrillation: Secondary | ICD-10-CM

## 2020-04-09 DIAGNOSIS — I480 Paroxysmal atrial fibrillation: Secondary | ICD-10-CM

## 2020-04-09 DIAGNOSIS — I10 Essential (primary) hypertension: Secondary | ICD-10-CM

## 2020-04-09 NOTE — Patient Instructions (Signed)
Medication Instructions:  Your physician recommends that you continue on your current medications as directed. Please refer to the Current Medication list given to you today.   *If you need a refill on your cardiac medications before your next appointment, please call your pharmacy*  Lab Work: NONE   Testing/Procedures: NONE  Follow-Up: At [facility], you and your health needs are our priority.  As part of our continuing mission to provide you with exceptional heart care, we have created designated Provider Care Teams.  These Care Teams include your primary Cardiologist (physician) and Advanced Practice Providers (APPs -  Physician Assistants and Nurse Practitioners) who all work together to provide you with the care you need, when you need it.  We recommend signing up for the patient portal called "MyChart".  Sign up information is provided on this After Visit Summary.  MyChart is used to connect with patients for Virtual Visits (Telemedicine).  Patients are able to view lab/test results, encounter notes, upcoming appointments, etc.  Non-urgent messages can be sent to your provider as well.   To learn more about what you can do with MyChart, go to [email or web address].    Your next appointment:   6 month(s)  The format for your next appointment:   In Person  Provider:   You may see [name], MD or one of the following Advanced Practice Providers on your designated Care Team:    Sande Rives, PA-C  Coletta Memos, FNP  Other Instructions  TAKE YOU BLOOD PRESSURE MACHINE TO YOUR FOLLOW UP WITH YOUR PRIMARY CARE TO [place]

## 2020-04-09 NOTE — Progress Notes (Signed)
.    Cardiology Office Note   Date:  04/09/2020   ID:  Deanna [name], DOB 09-27-1930, MRN [id number]  PCP:  Deanna Redwood, MD  Cardiologist:   Deanna Latch, MD   No chief complaint on file.   History of Present Illness: Deanna [name] is a 87 y.o. female with paroxysmal atrial fibrillation, chronic left bundle branch block, diabetes, carcinoid, and pulmonary hypertension who presents for follow-up.  She was first diagnosed with atrial fibrillation 07/2019 in the setting of sepsis and colitis.  Her heart rates were controlled and she was started on Eliquis.  Echo that admission revealed LVEF 60 to 65% with moderate LVH and indeterminate diastolic function.  However she developed GI bleeding requiring transfusions her Eliquis was discontinued.  She was subsequently hospitalized at [facility] 08/2019 with a transfusion reaction.  She followed up with atrial fibrillation clinic 08/2019 and was maintaining sinus rhythm.  She has also struggled with symptoms of carcinoid.  She was found to have a mesenteric mass and elevated Chromogranin A levels.  She follows with Dr. Marin [name] for this.  She was treated with a round of steroids and her diarrhea has improved.  She continues to have occasional flushing.  Lately Deanna [name] has been feeling well.  She had a fall in December 2021 and had a hairline fracture of the hip that didn't require surgery.  She is getting limited exercise due to COVID-19.  She has no chest pain or shortness of breath.  She has no LE edema, orthopnea or PND.  Her BP at home has been mostly in the 120s over 60s.  She notes that it is always higher when she goes to the doctor's office.  Overall she has felt well.  She is frustrated that her family will let her drive.  She had to stop driving after her fall but now that her hip is healed she wants to resume driving.  She does not think she had any recurrent atrial fibrillation.   Past Medical History:  Diagnosis Date  . Arthritis     all over  . Cancer ([place])    breast  . Depression   . Diabetes mellitus without complication (HCC)    diet controlled  . Goals of care, counseling/discussion 08/17/2019  . Hypertension   . Iron deficiency anemia due to chronic blood loss 08/17/2019  . Lower GI bleed 08/17/2019  . Malignant carcinoid tumor of duodenum ([place]) 09/14/2019  . PAF (paroxysmal atrial fibrillation) ([place]) 07/30/2019    Past Surgical History:  Procedure Laterality Date  . APPENDECTOMY     with lower back surgery  . BACK SURGERY     x2  . BIOPSY  08/02/2019   Procedure: BIOPSY;  Surgeon: Yetta Flock, MD;  Location: [facility];  Service: Gastroenterology;;  . BREAST SURGERY Left 1995   complete left mastectomy  . CERVICAL SPINE SURGERY    . COLONOSCOPY WITH PROPOFOL N/A 08/02/2019   Procedure: COLONOSCOPY WITH PROPOFOL;  Surgeon: Yetta Flock, MD;  [facility];  Service: Gastroenterology;  Laterality: N/A;  . EYE SURGERY Bilateral    cataracts  . TONSILLECTOMY     at age 89     Current Outpatient Medications  Medication Sig Dispense Refill  . acetaminophen (TYLENOL) 325 MG tablet Take 650 mg by mouth 2 (two) times a day.     Marland Kitchen amLODipine (NORVASC) 5 MG tablet Take 1 tablet (5 mg total) by mouth daily. 90 tablet 3  . buPROPion (  WELLBUTRIN XL) 150 MG 24 hr tablet Take 150 mg by mouth daily.    . carvedilol (COREG) 25 MG tablet Take 1 tablet (25 mg total) by mouth 2 (two) times daily with a meal.    . Cyanocobalamin (VITAMIN B 12 PO) Take 1,000 mcg by mouth daily.    . cyclobenzaprine (FLEXERIL) 5 MG tablet Take 5 mg by mouth every 8 (eight) hours as needed for muscle spasms.    . cycloSPORINE (RESTASIS) 0.05 % ophthalmic emulsion Place 1 drop into both eyes 2 (two) times daily.    Marland Kitchen denosumab (PROLIA) 60 MG/ML SOLN injection Inject 60 mg into the skin every 6 (six) months. Administer in upper arm, thigh, or abdomen    . diclofenac Sodium (VOLTAREN) 1 % GEL Apply 2 g topically 4 (four)  times daily as needed (painful joints).    . escitalopram (LEXAPRO) 20 MG tablet Take 20 mg by mouth daily.    Marland Kitchen esomeprazole (NEXIUM) 40 MG capsule Take 40 mg by mouth daily at 12 noon.    . feeding supplement, ENSURE ENLIVE, (ENSURE ENLIVE) LIQD Take 237 mLs by mouth 3 (three) times daily between meals. 237 mL 12  . ferrous sulfate 325 (65 FE) MG tablet Take 1 tablet (325 mg total) by mouth daily with breakfast.    . FREESTYLE LITE test strip     . furosemide (LASIX) 20 MG tablet Take 20 mg by mouth daily.    . hydrochlorothiazide (MICROZIDE) 12.5 MG capsule     . insulin glargine (LANTUS) 100 UNIT/ML injection Inject 0.05 mLs (5 Units total) into the skin daily. (Patient taking differently: Inject 18 Units into the skin daily.)    . Lancets (FREESTYLE) lancets 1 each daily.    Marland Kitchen loperamide (IMODIUM A-D) 2 MG tablet Take 2 mg by mouth 4 (four) times daily as needed for diarrhea or loose stools.    . mesalamine (LIALDA) 1.2 g EC tablet Take 2.4 g by mouth daily with breakfast.    . metFORMIN (GLUCOPHAGE) 500 MG tablet Take 500 mg by mouth daily with breakfast.    . NOVOLOG FLEXPEN 100 UNIT/ML FlexPen Inject into the skin.    . pregabalin (LYRICA) 75 MG capsule Take 1 capsule (75 mg total) by mouth 2 (two) times daily. 10 capsule 0  . Probiotic Product (PROBIOTIC-10 PO) Take 1 capsule by mouth daily.    . rosuvastatin (CRESTOR) 10 MG tablet Take 10 mg by mouth at bedtime.     . SURE COMFORT PEN NEEDLES 31G X 5 MM MISC     . SYMAX-SR 0.375 MG 12 hr tablet Take 0.375 mg by mouth every 12 (twelve) hours as needed for pain.    . traMADol (ULTRAM) 50 MG tablet Take 50 mg by mouth 3 (three) times daily as needed.    Marland Kitchen VITAMIN D, CHOLECALCIFEROL, PO Take 400 Units by mouth daily.     . vitamin E 400 UNIT capsule Take 400 Units by mouth 2 (two) times a day.      No current facility-administered medications for this visit.    Allergies:   Codeine    Social History:  The patient  reports that she  has never smoked. She has never used smokeless tobacco. She reports current alcohol use of about 1.0 standard drink of alcohol per week. She reports that she does not use drugs.   Family History:  The patient's family history includes Heart disease in her father and mother; Hypertension in her father and  mother.    ROS:  Please see the history of present illness.   Otherwise, review of systems are positive for none.   All other systems are reviewed and negative.    PHYSICAL EXAM: VS:  BP (!) 170/68   Pulse 64   Ht 5\' 2"  (1.575 m)   Wt 106 lb (48.1 kg)   SpO2 98%   BMI 19.39 kg/m  , BMI Body mass index is 19.39 kg/m. GENERAL:  Well appearing HEENT:  Pupils equal round and reactive, fundi not visualized, oral mucosa unremarkable NECK:  No jugular venous distention, waveform within normal limits, carotid upstroke brisk and symmetric, no bruits, no thyromegaly LYMPHATICS:  No cervical adenopathy LUNGS:  Clear to auscultation bilaterally HEART:  RRR.  PMI not displaced or sustained,S1 and S2 within normal limits, no S3, no S4, no clicks, no rubs, II/VI systolic murmur ABD:  Flat, positive bowel sounds normal in frequency in pitch, no bruits, no rebound, no guarding, no midline pulsatile mass, no hepatomegaly, no splenomegaly EXT:  2 plus pulses throughout, no edema, no cyanosis no clubbing SKIN:  No rashes no nodules NEURO:  Cranial nerves II through XII grossly intact, motor grossly intact throughout PSYCH:  Cognitively intact, oriented to person place and time   EKG:  EKG is ordered today. The ekg ordered today demonstrates sinus rhythm.  Rate 66 bpm.  LBBB.  R axis deviation 3/22: Sinus rhythm.  Rate 64 bpm.  IVCD.  Prior anterior infarct.  Echo 07/31/19  1. Left ventricular ejection fraction, by estimation, is 60 to 65%. The  left ventricle has normal function. The left ventricle has no regional  wall motion abnormalities. There is moderate concentric left ventricular   hypertrophy. Left ventricular  diastolic parameters are indeterminate. Elevated left ventricular  end-diastolic pressure.  2. Right ventricular systolic function is normal. The right ventricular  size is normal. There is mildly elevated pulmonary artery systolic  pressure. The estimated right ventricular systolic pressure is 36.6 mmHg.  3. The mitral valve is normal in structure. Mild mitral valve  regurgitation. No evidence of mitral stenosis.  4. Tricuspid valve regurgitation is moderate.  5. The aortic valve is tricuspid. Aortic valve regurgitation is not  visualized. Mild to moderate aortic valve sclerosis/calcification is  present, without any evidence of aortic stenosis.  6. The inferior vena cava is normal in size with greater than 50%  respiratory variability, suggesting right atrial pressure of 3 mmHg.    Recent Labs: 07/30/2019: TSH 0.845 07/31/2019: B Natriuretic Peptide 874.8 08/02/2019: Magnesium 1.7 04/08/2020: ALT 14; BUN 29; Creatinine 1.18; Hemoglobin 11.9; Platelet Count 172; Potassium 4.5; Sodium 140    Lipid Panel No results found for: CHOL, TRIG, HDL, CHOLHDL, VLDL, LDLCALC, LDLDIRECT    Wt Readings from Last 3 Encounters:  04/09/20 106 lb (48.1 kg)  04/08/20 105 lb 12 oz (48 kg)  03/26/20 104 lb (47.2 kg)      ASSESSMENT AND PLAN:  # PAF: She had an episode of atrial fibrillation that occurred in the setting of sepsis.  No known recurrent episodes.  She developed a GI bleed and has a known carcinoid tumor.  Therefore she is not on anticoagulation.  Continue carvedilol.  # Hypertension:  Blood pressure is poorly controlled both here but she reports that it has been well controlled at home.  She think she has whitecoat hypertension superimposed on her essential hypertension.  She will bring her blood pressure cuff to her next appointment to check that it is  accurate.  For now, continue amlodipine, carvedilol, and HCTZ.  # Hyperlipidemia:  Continue  rosuvastatin.  Current medicines are reviewed at length with the patient today.  The patient does not have concerns regarding medicines.  The following changes have been made: Switch diltiazem to amlodipine  Labs/ tests ordered today include:   Orders Placed This Encounter  Procedures  . EKG 12-Lead     Disposition:   FU with [place] Quinter C. Oval Linsey, MD, [facility] in 6 months     Signed, Kaydyn Sayas C. Oval Linsey, MD, [facility]  04/09/2020 3:16 PM    [place]

## 2020-04-10 LAB — CHROMOGRANIN A: Chromogranin A (ng/mL): 255.1 ng/mL — ABNORMAL HIGH (ref 0.0–101.8)

## 2020-04-11 ENCOUNTER — Encounter: Payer: Self-pay | Admitting: *Deleted

## 2020-04-15 ENCOUNTER — Other Ambulatory Visit: Payer: Self-pay

## 2020-04-15 ENCOUNTER — Inpatient Hospital Stay: Payer: Medicare Other

## 2020-04-15 VITALS — BP 142/82 | HR 80 | Temp 98.0°F | Resp 17

## 2020-04-15 DIAGNOSIS — D500 Iron deficiency anemia secondary to blood loss (chronic): Secondary | ICD-10-CM

## 2020-04-15 DIAGNOSIS — K922 Gastrointestinal hemorrhage, unspecified: Secondary | ICD-10-CM

## 2020-04-15 DIAGNOSIS — R1900 Intra-abdominal and pelvic swelling, mass and lump, unspecified site: Secondary | ICD-10-CM | POA: Diagnosis not present

## 2020-04-15 MED ORDER — SODIUM CHLORIDE 0.9 % IV SOLN
200.0000 mg | Freq: Once | INTRAVENOUS | Status: AC
  Administered 2020-04-15: 200 mg via INTRAVENOUS
  Filled 2020-04-15: qty 200

## 2020-04-15 MED ORDER — SODIUM CHLORIDE 0.9 % IV SOLN
Freq: Once | INTRAVENOUS | Status: AC
  Filled 2020-04-15: qty 250

## 2020-04-15 NOTE — Patient Instructions (Signed)

## 2020-04-23 ENCOUNTER — Telehealth: Payer: Self-pay | Admitting: Orthopaedic Surgery

## 2020-04-23 NOTE — Telephone Encounter (Signed)
Please advise 

## 2020-04-23 NOTE — Telephone Encounter (Signed)
Patient called asked if she can be referred for physical  therapy at [facility] in [place]. The number to contact patient is  [phone]

## 2020-04-24 ENCOUNTER — Other Ambulatory Visit: Payer: Self-pay

## 2020-04-24 DIAGNOSIS — M542 Cervicalgia: Secondary | ICD-10-CM

## 2020-04-24 DIAGNOSIS — R102 Pelvic and perineal pain: Secondary | ICD-10-CM

## 2020-04-24 DIAGNOSIS — M25551 Pain in right hip: Secondary | ICD-10-CM

## 2020-04-24 NOTE — Telephone Encounter (Signed)
Order sent.

## 2020-04-24 NOTE — Telephone Encounter (Signed)
Yes, OK to do thank you

## 2020-04-30 ENCOUNTER — Ambulatory Visit (INDEPENDENT_AMBULATORY_CARE_PROVIDER_SITE_OTHER): Payer: Medicare Other | Admitting: Orthopaedic Surgery

## 2020-04-30 ENCOUNTER — Encounter: Payer: Self-pay | Admitting: Orthopaedic Surgery

## 2020-04-30 VITALS — BP 172/72 | HR 56 | Ht 62.0 in | Wt 106.0 lb

## 2020-04-30 DIAGNOSIS — M4322 Fusion of spine, cervical region: Secondary | ICD-10-CM | POA: Diagnosis not present

## 2020-04-30 DIAGNOSIS — R102 Pelvic and perineal pain: Secondary | ICD-10-CM

## 2020-04-30 DIAGNOSIS — M4722 Other spondylosis with radiculopathy, cervical region: Secondary | ICD-10-CM

## 2020-04-30 NOTE — Progress Notes (Signed)
Office Visit Note   Patient: Deanna [name]           Date of Birth: Sep 04, 1930           MRN: [id number] Visit Date: 04/30/2020              Requested by: Ginger Organ., MD [address] [place],  [place] [address] PCP: Ginger Organ., MD   Assessment & Plan: Visit Diagnoses:  1. Fusion of spine of cervical region   2. Other spondylosis with radiculopathy, cervical region   3. Pelvic pain     Plan: Patient will call if she has increased problems.  We reviewed her previous x-rays and discussed with her that if she develops progressive arm weakness or myopathic symptoms we can proceed with cervical MRI scan.  Follow-up as needed.  Follow-Up Instructions: Return if symptoms worsen or fail to improve.   Orders:  No orders of the defined types were placed in this encounter.  No orders of the defined types were placed in this encounter.     Procedures: No procedures performed   Clinical Data: No additional findings.   Subjective: No chief complaint on file.   HPI 87 year old female returns had previous lumbar surgery years ago cervical fusion by Dr. Carloyn Manner with persistent problems with neck pain.  She is used the collar with some improvement.  Pain seems to be worse on the left.  She is fused C4-C7 which is solid but has disc degenerative changes at C3-4 and has anterolisthesis at C2-3.  At 48 she is not really interested in more cervical surgery.  Patient is also had some pain in her groin states that recently has been better.  She has pain when she turns her neck to the left.  Her back bothers her if she is on her feet a lot she gets relief with sitting.  She is used tramadol rarely.  Review of Systems previous carcinoid tumor of the duodenum.  Previous cervical lumbar surgeries as above.  All the systems noncontributory to HPI.   Objective: Vital Signs: BP (!) 172/72   Pulse (!) 56   Ht 5\' 2"  (1.575 m)   Wt 106 lb (48.1 kg)   BMI 19.39 kg/m   Physical  Exam Constitutional:      Appearance: She is well-developed.  HENT:     Head: Normocephalic.     Right Ear: External ear normal.     Left Ear: External ear normal.  Eyes:     Pupils: Pupils are equal, round, and reactive to light.  Neck:     Thyroid: No thyromegaly.     Trachea: No tracheal deviation.  Cardiovascular:     Rate and Rhythm: Normal rate.  Pulmonary:     Effort: Pulmonary effort is normal.  Abdominal:     Palpations: Abdomen is soft.  Skin:    General: Skin is warm and dry.  Neurological:     Mental Status: She is alert and oriented to person, place, and time.  Psychiatric:        Behavior: Behavior normal.     Ortho Exam patient has some brachial plexus tenderness on the left, 50% rotation the left with some discomfort.  Upper extremity reflexes are 2+.  Normal hip range of motion.  Negative straight leg raising 90 degrees.  No venous stasis changes.  Palpable pulses sensation the foot is intact. Specialty Comments:  No specialty comments available.  Imaging: No results found.  PMFS History: Patient Active Problem List   Diagnosis Date Noted  . Pelvic pain 03/26/2020  . Fusion of spine of cervical region 03/26/2020  . Other spondylosis with radiculopathy, cervical region 03/26/2020  . Malignant carcinoid tumor of duodenum ([place]) 09/14/2019  . Secondary hypercoagulable state ([place]) 08/28/2019  . Lower GI bleed 08/17/2019  . Iron deficiency anemia due to chronic blood loss 08/17/2019  . Goals of care, counseling/discussion 08/17/2019  . Essential hypertension 08/06/2019  . CKD (chronic kidney disease), stage III ([place]) 08/06/2019  . Calcified mesenteric mass 08/06/2019  . Diarrhea   . Acute respiratory failure with hypoxia ([place])   . Sepsis without acute organ dysfunction ([place])   . PAF (paroxysmal atrial fibrillation) ([place]) 07/30/2019  . Colitis 07/27/2019  . Type 2 macular telangiectasis, left 07/26/2019  . Retinal hemorrhage of left eye 07/26/2019  .  Type 2 macular telangiectasis, right 07/26/2019  . Vitreomacular adhesion of right eye 07/26/2019  . Vitreomacular adhesion of left eye 07/26/2019  . Loss of appetite 09/23/2016  . AKI (acute kidney injury) ([place]) 09/23/2016  . Orthostasis 09/23/2016  . Hyponatremia 09/23/2016  . Leukocytosis 09/23/2016  . Normocytic anemia 09/23/2016  . Depression with anxiety 09/23/2016  . Diabetes mellitus type II, non insulin dependent ([place]) 09/23/2016  . Transaminasemia 09/23/2016  . Dehydration   . Elevated LFTs   . Chronic midline low back pain without sciatica 01/13/2016   Past Medical History:  Diagnosis Date  . Arthritis    all over  . Cancer ([place])    breast  . Depression   . Diabetes mellitus without complication (HCC)    diet controlled  . Goals of care, counseling/discussion 08/17/2019  . Hypertension   . Iron deficiency anemia due to chronic blood loss 08/17/2019  . Lower GI bleed 08/17/2019  . Malignant carcinoid tumor of duodenum ([place]) 09/14/2019  . PAF (paroxysmal atrial fibrillation) ([place]) 07/30/2019    Family History  Problem Relation Age of Onset  . Hypertension Mother   . Heart disease Mother   . Hypertension Father   . Heart disease Father     Past Surgical History:  Procedure Laterality Date  . APPENDECTOMY     with lower back surgery  . BACK SURGERY     x2  . BIOPSY  08/02/2019   Procedure: BIOPSY;  Surgeon: Yetta Flock, MD;  [facility];  Service: Gastroenterology;;  . BREAST SURGERY Left 1995   complete left mastectomy  . CERVICAL SPINE SURGERY    . COLONOSCOPY WITH PROPOFOL N/A 08/02/2019   Procedure: COLONOSCOPY WITH PROPOFOL;  Surgeon: Yetta Flock, MD;  [facility];  Service: Gastroenterology;  Laterality: N/A;  . EYE SURGERY Bilateral    cataracts  . TONSILLECTOMY     at age 36   Social History   Occupational History  . Not on file  Tobacco Use  . Smoking status: Never Smoker  . Smokeless tobacco: Never Used   Vaping Use  . Vaping Use: Never used  Substance and Sexual Activity  . Alcohol use: Yes    Alcohol/week: 1.0 standard drink    Types: 1 Glasses of wine per week    Comment: yearly  . Drug use: Never  . Sexual activity: Not on file

## 2020-05-15 ENCOUNTER — Encounter: Payer: Self-pay | Admitting: Surgery

## 2020-05-15 ENCOUNTER — Ambulatory Visit: Payer: Self-pay

## 2020-05-15 ENCOUNTER — Ambulatory Visit (INDEPENDENT_AMBULATORY_CARE_PROVIDER_SITE_OTHER): Payer: Medicare Other | Admitting: Surgery

## 2020-05-15 VITALS — Ht 62.0 in | Wt 106.0 lb

## 2020-05-15 DIAGNOSIS — M25531 Pain in right wrist: Secondary | ICD-10-CM | POA: Diagnosis not present

## 2020-05-15 DIAGNOSIS — R102 Pelvic and perineal pain: Secondary | ICD-10-CM

## 2020-05-15 NOTE — Progress Notes (Signed)
Office Visit Note   Patient: Deanna [name]           Date of Birth: 1930-06-17           MRN: [id number] Visit Date: 05/15/2020              Requested by: Ginger Organ., MD [address] [place],  [place] [address] PCP: Ginger Organ., MD   Assessment & Plan: Visit Diagnoses:  1. Pelvic pain   2. Pain in right wrist     Plan: Reviewed x-rays with patient.  Do not see any acute injury to the pelvis.  She may have a nondisplaced distal radius fracture.  Patient was put in a fiberglass splint.  We will leave this on at all times.  Follow-up in a couple weeks for recheck.  Follow-Up Instructions: Return in about 13 days (around 05/28/2020) for with Divya Munshi.   Orders:  Orders Placed This Encounter  Procedures   XR HIP UNILAT W OR W/O PELVIS 2-3 VIEWS RIGHT   XR Wrist 2 Views Right   No orders of the defined types were placed in this encounter.     Procedures: No procedures performed   Clinical Data: No additional findings.   Subjective: Chief Complaint  Patient presents with   Right Wrist - Pain    HPI 87 year old female comes in today with complaints of right wrist pain and right groin pain.  Patient states that she was pulling flowers yesterday when she tipped over and catch herself with her right hand.  Right wrist was swollen and had some bruising yesterday.  She has been taking Tylenol and tramadol without improvement.  Some pain in the right groin when she is ambulating but not too bad.  Patient has a history of subacute right pubic Ramey fracture that was seen on xray March 26, 2020. Review of Systems No current cardiac pulmonary GI GU issues  Objective: Vital Signs: Ht 5\' 2"  (1.575 m)   Wt 106 lb (48.1 kg)   BMI 19.39 kg/m   Physical Exam HENT:     Head: Normocephalic and atraumatic.  Eyes:     Extraocular Movements: Extraocular movements intact.  Pulmonary:     Effort: Pulmonary effort is normal.  Musculoskeletal:     Comments:  Patient does have some swelling and slight bruising.  Tender over the distal radius.  No deformity.  Negative log roll bilateral hips.  Negative straight leg raise.  Neurological:     Mental Status: She is alert and oriented to person, place, and time.  Psychiatric:        Mood and Affect: Mood normal.    Ortho Exam  Specialty Comments:  No specialty comments available.  Imaging: No results found.   PMFS History: Patient Active Problem List   Diagnosis Date Noted   Pelvic pain 03/26/2020   Fusion of spine of cervical region 03/26/2020   Other spondylosis with radiculopathy, cervical region 03/26/2020   Malignant carcinoid tumor of duodenum ([place]) 09/14/2019   Secondary hypercoagulable state ([place]) 08/28/2019   Lower GI bleed 08/17/2019   Iron deficiency anemia due to chronic blood loss 08/17/2019   Goals of care, counseling/discussion 08/17/2019   Essential hypertension 08/06/2019   CKD (chronic kidney disease), stage III ([place]) 08/06/2019   Calcified mesenteric mass 08/06/2019   Diarrhea    Acute respiratory failure with hypoxia (HCC)    Sepsis without acute organ dysfunction (HCC)    PAF (paroxysmal atrial fibrillation) ([place]) 07/30/2019  Colitis 07/27/2019   Type 2 macular telangiectasis, left 07/26/2019   Retinal hemorrhage of left eye 07/26/2019   Type 2 macular telangiectasis, right 07/26/2019   Vitreomacular adhesion of right eye 07/26/2019   Vitreomacular adhesion of left eye 07/26/2019   Loss of appetite 09/23/2016   AKI (acute kidney injury) ([place]) 09/23/2016   Orthostasis 09/23/2016   Hyponatremia 09/23/2016   Leukocytosis 09/23/2016   Normocytic anemia 09/23/2016   Depression with anxiety 09/23/2016   Diabetes mellitus type II, non insulin dependent ([place]) 09/23/2016   Transaminasemia 09/23/2016   Dehydration    Elevated LFTs    Chronic midline low back pain without sciatica 01/13/2016   Past Medical History:  Diagnosis Date   Arthritis    all over    Cancer [facility])    breast   Depression    Diabetes mellitus without complication ([place])    diet controlled   Goals of care, counseling/discussion 08/17/2019   Hypertension    Iron deficiency anemia due to chronic blood loss 08/17/2019   Lower GI bleed 08/17/2019   Malignant carcinoid tumor of duodenum ([place]) 09/14/2019   PAF (paroxysmal atrial fibrillation) ([place]) 07/30/2019    Family History  Problem Relation Age of Onset   Hypertension Mother    Heart disease Mother    Hypertension Father    Heart disease Father     Past Surgical History:  Procedure Laterality Date   APPENDECTOMY     with lower back surgery   BACK SURGERY     x2   BIOPSY  08/02/2019   Procedure: BIOPSY;  Surgeon: Yetta Flock, MD;  [facility];  Service: Gastroenterology;;   BREAST SURGERY Left 1995   complete left mastectomy   CERVICAL SPINE SURGERY     COLONOSCOPY WITH PROPOFOL N/A 08/02/2019   Procedure: COLONOSCOPY WITH PROPOFOL;  Surgeon: Yetta Flock, MD;  [facility];  Service: Gastroenterology;  Laterality: N/A;   EYE SURGERY Bilateral    cataracts   TONSILLECTOMY     at age 68   Social History   Occupational History   Not on file  Tobacco Use   Smoking status: Never Smoker   Smokeless tobacco: Never Used  Scientific laboratory technician Use: Never used  Substance and Sexual Activity   Alcohol use: Yes    Alcohol/week: 1.0 standard drink    Types: 1 Glasses of wine per week    Comment: yearly   Drug use: Never   Sexual activity: Not on file

## 2020-05-16 ENCOUNTER — Telehealth: Payer: Self-pay | Admitting: Cardiovascular Disease

## 2020-05-16 NOTE — Telephone Encounter (Signed)
Pt c/o BP issue: STAT if pt c/o blurred vision, one-sided weakness or slurred speech  1. What are your last 5 BP readings? 116/50; 80/42; 54;   2. Are you having any other symptoms (ex. Dizziness, headache, blurred vision, passed out)?  No  3. What is your BP issue? Patient fell and has low BP

## 2020-05-16 NOTE — Telephone Encounter (Signed)
Pts daughter, Jackelyn Poling on [place], called to report that the pt had fallen twice... this past Wednesday when picking flowers so she was bent over and passed out after rising but she has not reported that she felt dizzy and did not pass out. She felt she just lost her balance/  Her BP then when checked by nursing at her facility where she lives. She hurt her wrist and saw ortho for a splint.   Her BP that morning after her fall was 90/46 then 80/42 HR 54. This was on 05/14/20.   05/15/20 her BP was 120/68 and 104/58 HR 58.   05/16/20 she fell this morning after getting out of bed to let the cat out... BP 116/50 and HR 60. She did not have any injuries.   The nurses where she lives has held her morning BO meds and will continue to monitor her BP and record for Dr. Oval Linsey.   She has not been drinking much fluids so they will get her to eat and drink more... and maybe al little added NA this morning.   She will call her PCP Dr. Brigitte Pulse to see if she can see him sooner than her next appt which is in about a month.   I will forward to Dr. Oval Linsey... she is out of the office today but the pts daughter will  continue to monitor her and call if anything changes or worsens. She will take her to the ED/ Urgent Care if anything worsens for fluids if needed.

## 2020-05-22 NOTE — Telephone Encounter (Signed)
Left message to call back  

## 2020-05-22 NOTE — Telephone Encounter (Signed)
Recommend stopping the HCTZ and reducing the carvedilol to half. Continue to monitor.    TCR

## 2020-05-28 ENCOUNTER — Ambulatory Visit (INDEPENDENT_AMBULATORY_CARE_PROVIDER_SITE_OTHER): Payer: Medicare Other | Admitting: Surgery

## 2020-05-28 ENCOUNTER — Ambulatory Visit: Payer: Self-pay

## 2020-05-28 ENCOUNTER — Encounter: Payer: Self-pay | Admitting: Surgery

## 2020-05-28 VITALS — BP 115/58 | HR 54 | Ht 62.0 in | Wt 106.0 lb

## 2020-05-28 DIAGNOSIS — M25531 Pain in right wrist: Secondary | ICD-10-CM | POA: Diagnosis not present

## 2020-05-28 NOTE — Progress Notes (Signed)
Office Visit Note   Patient: Deanna [name]           Date of Birth: 1930/06/15           MRN: [id number] Visit Date: 05/28/2020              Requested by: Ginger Organ., MD [address] [place],  [place] [address] PCP: Ginger Organ., MD   Assessment & Plan: Visit Diagnoses:  1. Pain in right wrist   right distal radius fracture  Plan: Today patient was put back into a fiberglass splint.  I will have her keep this on.  Follow-up with Dr. Lorin Mercy in 2 weeks for recheck and we will see if he wants to put her in a removable splint at that time.  No weightbearing through the right upper extremity. Follow-Up Instructions: Return in about 2 weeks (around 06/11/2020) for with dr yates recheck right wrist fracture.   Orders:  Orders Placed This Encounter  Procedures  . XR Wrist 2 Views Right   No orders of the defined types were placed in this encounter.     Procedures: No procedures performed   Clinical Data: No additional findings.   Subjective: Chief Complaint  Patient presents with  . Right Wrist - Follow-up  . Right Hip - Follow-up    HPI 87 year old white female returns for recheck of right wrist pain with distal radius fracture and right groin pain.  States that her groin pain is better.  She also has less pain in the right wrist.  Objective: Vital Signs: BP (!) 115/58   Pulse (!) 54   Ht 5\' 2"  (1.575 m)   Wt 106 lb (48.1 kg)   BMI 19.39 kg/m   Physical Exam Pleasant elderly white female alert and oriented in no acute distress.  Right wrist less swelling.  Still continues to have some tenderness distal radius but this is improved from last visit. Ortho Exam  Specialty Comments:  No specialty comments available.  Imaging: No results found.   PMFS History: Patient Active Problem List   Diagnosis Date Noted  . Pelvic pain 03/26/2020  . Fusion of spine of cervical region 03/26/2020  . Other spondylosis with radiculopathy, cervical region  03/26/2020  . Malignant carcinoid tumor of duodenum ([place]) 09/14/2019  . Secondary hypercoagulable state ([place]) 08/28/2019  . Lower GI bleed 08/17/2019  . Iron deficiency anemia due to chronic blood loss 08/17/2019  . Goals of care, counseling/discussion 08/17/2019  . Essential hypertension 08/06/2019  . CKD (chronic kidney disease), stage III ([place]) 08/06/2019  . Calcified mesenteric mass 08/06/2019  . Diarrhea   . Acute respiratory failure with hypoxia ([place])   . Sepsis without acute organ dysfunction ([place])   . PAF (paroxysmal atrial fibrillation) ([place]) 07/30/2019  . Colitis 07/27/2019  . Type 2 macular telangiectasis, left 07/26/2019  . Retinal hemorrhage of left eye 07/26/2019  . Type 2 macular telangiectasis, right 07/26/2019  . Vitreomacular adhesion of right eye 07/26/2019  . Vitreomacular adhesion of left eye 07/26/2019  . Loss of appetite 09/23/2016  . AKI (acute kidney injury) ([place]) 09/23/2016  . Orthostasis 09/23/2016  . Hyponatremia 09/23/2016  . Leukocytosis 09/23/2016  . Normocytic anemia 09/23/2016  . Depression with anxiety 09/23/2016  . Diabetes mellitus type II, non insulin dependent ([facility]) 09/23/2016  . Transaminasemia 09/23/2016  . Dehydration   . Elevated LFTs   . Chronic midline low back pain without sciatica 01/13/2016   Past Medical History:  Diagnosis  Date  . Arthritis    all over  . Cancer ([place])    breast  . Depression   . Diabetes mellitus without complication (HCC)    diet controlled  . Goals of care, counseling/discussion 08/17/2019  . Hypertension   . Iron deficiency anemia due to chronic blood loss 08/17/2019  . Lower GI bleed 08/17/2019  . Malignant carcinoid tumor of duodenum ([facility] [facility]) 09/14/2019  . PAF (paroxysmal atrial fibrillation) ([place]) 07/30/2019    Family History  Problem Relation Age of Onset  . Hypertension Mother   . Heart disease Mother   . Hypertension Father   . Heart disease Father     Past Surgical History:  Procedure Laterality  Date  . APPENDECTOMY     with lower back surgery  . BACK SURGERY     x2  . BIOPSY  08/02/2019   Procedure: BIOPSY;  Surgeon: Yetta Flock, MD;  Location: [facility];  Service: Gastroenterology;;  . BREAST SURGERY Left 1995   complete left mastectomy  . CERVICAL SPINE SURGERY    . COLONOSCOPY WITH PROPOFOL N/A 08/02/2019   Procedure: COLONOSCOPY WITH PROPOFOL;  Surgeon: Yetta Flock, MD;  Location: [facility];  Service: Gastroenterology;  Laterality: N/A;  . EYE SURGERY Bilateral    cataracts  . TONSILLECTOMY     at age 8   Social History   Occupational History  . Not on file  Tobacco Use  . Smoking status: Never Smoker  . Smokeless tobacco: Never Used  Vaping Use  . Vaping Use: Never used  Substance and Sexual Activity  . Alcohol use: Yes    Alcohol/week: 1.0 standard drink    Types: 1 Glasses of wine per week    Comment: yearly  . Drug use: Never  . Sexual activity: Not on file

## 2020-05-29 NOTE — Telephone Encounter (Signed)
Spoke with patient of Dr. Oval Linsey (daughter)  4/9: 118/51 HR 55 (no lasix) 4/10: 109/64 HR 56, 128/59, 117/61 4/11: 117/61, 148/73 4/12: 180/79 HR 50 (just finished PT), 164/84 4/12: 138/78 HR 56, 110/50 HR 56 (at PCP office) 4/20: 115/58 HR 54 (at Dr.Yates)  Patient saw PCP on 4/12 - they stopped lasix, did labs, urine test  Relayed message as noted below. Daughter states "this is the first time I am hearing of these medications". Explained that [facility] left a message to call back.   Patient has had GI issues start up again, have gotten more frequent - borderline dehydrated due loose stools, diarrhea -- sees GI PA today  Daughter requesting Dr. Oval Linsey review updated BP readings and change PCP office made and advise further

## 2020-06-02 ENCOUNTER — Telehealth: Payer: Self-pay

## 2020-06-02 NOTE — Telephone Encounter (Signed)
S/w pts daughter and her labs were moved to 8:30 to allow for scan time.    Deanna [name]

## 2020-06-04 NOTE — Telephone Encounter (Signed)
Spoke with daughter regarding medications and blood pressure She is not sure if patient is taking HCTZ and is taking Carvedilol 25 mg twice a day Lasix stopped by PCP Daughter will call back tomorrow with updated blood pressure readings as well as information on HCTZ

## 2020-06-05 ENCOUNTER — Telehealth: Payer: Self-pay | Admitting: Cardiovascular Disease

## 2020-06-05 NOTE — Telephone Encounter (Signed)
Pt's daughter Jackelyn Poling is calling Rip Harbour back to give more BP readings:  05/30/20 114/68 pulse 53 4//23/22  145/75 pulse 58 06/03/20    184/75 pulse 50 06/05/20   160/77 pulse 56  Pt has a prescription for hydrochlorothiazide (MICROZIDE) 12.5 MG capsule but is not currently taking it but doesn't know why her mom isn't taking this med and who told pt not to take it. Pt is currently taking AmLOpdapine Pt is currently taking carvedilol (COREG) 25 MG tablet

## 2020-06-05 NOTE — Telephone Encounter (Signed)
Routed to [facility] LPN with updated BP readings and med update

## 2020-06-09 ENCOUNTER — Encounter: Payer: Self-pay | Admitting: *Deleted

## 2020-06-09 NOTE — Telephone Encounter (Signed)
Advised daughter and will have PCP send labs when she has later this month  Mychart message to patient   Jun 09, 2020  [name], MD to Deanna Levy, RN . Me     5:53 PM According to my notes she should be on HCTZ 12.5mg .

## 2020-06-10 ENCOUNTER — Encounter: Payer: Self-pay | Admitting: Orthopaedic Surgery

## 2020-06-10 ENCOUNTER — Ambulatory Visit: Payer: Self-pay

## 2020-06-10 ENCOUNTER — Other Ambulatory Visit: Payer: Self-pay

## 2020-06-10 ENCOUNTER — Ambulatory Visit (INDEPENDENT_AMBULATORY_CARE_PROVIDER_SITE_OTHER): Payer: Medicare Other | Admitting: Orthopaedic Surgery

## 2020-06-10 VITALS — BP 151/61 | HR 60 | Ht 62.0 in | Wt 106.0 lb

## 2020-06-10 DIAGNOSIS — M25531 Pain in right wrist: Secondary | ICD-10-CM

## 2020-06-10 NOTE — Progress Notes (Signed)
Office Visit Note   Patient: Deanna [name]           Date of Birth: 1930-11-12           MRN: [id number] Visit Date: 06/10/2020              Requested by: Ginger Organ., MD [address] [place],  [place] [address] PCP: Ginger Organ., MD   Assessment & Plan: Visit Diagnoses:  1. Pain in right wrist     Plan: Repeat x-rays negative for fracture.  She can resume normal activities no splint needed.  Follow-up as needed.  Fall prevention discussed.  Follow-Up Instructions: No follow-ups on file.   Orders:  Orders Placed This Encounter  Procedures  . XR Wrist 2 Views Right   No orders of the defined types were placed in this encounter.     Procedures: No procedures performed   Clinical Data: No additional findings.   Subjective: Chief Complaint  Patient presents with  . Right Wrist - Fracture, Follow-up    HPI 87-year-old female here with her daughter with a history of working in yard gardening bent over stood up and felt dizzy injuring her right wrist.  Previous x-ray showed questionable nondisplaced fracture.  She is placed in a splint states her wrist is feeling better now no swelling minimal discomfort.  Date of injury was just below before 05/15/2020.  She had seen cardiology about her dizziness.  Review of Systems updated unchanged.   Objective: Vital Signs: BP (!) 151/61   Pulse 60   Ht 5\' 2"  (1.575 m)   Wt 106 lb (48.1 kg)   BMI 19.39 kg/m   Physical Exam Constitutional:      Appearance: She is well-developed.  HENT:     Head: Normocephalic.     Right Ear: External ear normal.     Left Ear: External ear normal.  Eyes:     Pupils: Pupils are equal, round, and reactive to light.  Neck:     Thyroid: No thyromegaly.     Trachea: No tracheal deviation.  Cardiovascular:     Rate and Rhythm: Normal rate.  Pulmonary:     Effort: Pulmonary effort is normal.  Abdominal:     Palpations: Abdomen is soft.  Skin:    General: Skin is warm  and dry.  Neurological:     Mental Status: She is alert and oriented to person, place, and time.  Psychiatric:        Behavior: Behavior normal.     Ortho Exam slight tenderness over the dorsal wrist no swelling.  Dorsal compartments are normal.  Minimal tenderness over the scaphoid.  Carpal metacarpal joints are normal.  Ulnar styloids normal.  Good flexion and extension the fingertips.  Specialty Comments:  No specialty comments available.  Imaging: No results found.   PMFS History: Patient Active Problem List   Diagnosis Date Noted  . Pelvic pain 03/26/2020  . Fusion of spine of cervical region 03/26/2020  . Other spondylosis with radiculopathy, cervical region 03/26/2020  . Malignant carcinoid tumor of duodenum ([place]) 09/14/2019  . Secondary hypercoagulable state ([place]) 08/28/2019  . Lower GI bleed 08/17/2019  . Iron deficiency anemia due to chronic blood loss 08/17/2019  . Goals of care, counseling/discussion 08/17/2019  . Essential hypertension 08/06/2019  . CKD (chronic kidney disease), stage III ([place]) 08/06/2019  . Calcified mesenteric mass 08/06/2019  . Diarrhea   . Acute respiratory failure with hypoxia ([place])   .  Sepsis without acute organ dysfunction ([place])   . PAF (paroxysmal atrial fibrillation) ([place]) 07/30/2019  . Colitis 07/27/2019  . Type 2 macular telangiectasis, left 07/26/2019  . Retinal hemorrhage of left eye 07/26/2019  . Type 2 macular telangiectasis, right 07/26/2019  . Vitreomacular adhesion of right eye 07/26/2019  . Vitreomacular adhesion of left eye 07/26/2019  . Loss of appetite 09/23/2016  . AKI (acute kidney injury) ([place]) 09/23/2016  . Orthostasis 09/23/2016  . Hyponatremia 09/23/2016  . Leukocytosis 09/23/2016  . Normocytic anemia 09/23/2016  . Depression with anxiety 09/23/2016  . Diabetes mellitus type II, non insulin dependent ([place]) 09/23/2016  . Transaminasemia 09/23/2016  . Dehydration   . Elevated LFTs   . Chronic midline low back  pain without sciatica 01/13/2016   Past Medical History:  Diagnosis Date  . Arthritis    all over  . Cancer ([place])    breast  . Depression   . Diabetes mellitus without complication (HCC)    diet controlled  . Goals of care, counseling/discussion 08/17/2019  . Hypertension   . Iron deficiency anemia due to chronic blood loss 08/17/2019  . Lower GI bleed 08/17/2019  . Malignant carcinoid tumor of duodenum ([place]) 09/14/2019  . PAF (paroxysmal atrial fibrillation) ([place]) 07/30/2019    Family History  Problem Relation Age of Onset  . Hypertension Mother   . Heart disease Mother   . Hypertension Father   . Heart disease Father     Past Surgical History:  Procedure Laterality Date  . APPENDECTOMY     with lower back surgery  . BACK SURGERY     x2  . BIOPSY  08/02/2019   Procedure: BIOPSY;  Surgeon: Yetta Flock, MD;  Location: [facility];  Service: Gastroenterology;;  . BREAST SURGERY Left 1995   complete left mastectomy  . CERVICAL SPINE SURGERY    . COLONOSCOPY WITH PROPOFOL N/A 08/02/2019   Procedure: COLONOSCOPY WITH PROPOFOL;  Surgeon: Yetta Flock, MD;  Location[facility];  Service: Gastroenterology;  Laterality: N/A;  . EYE SURGERY Bilateral    cataracts  . TONSILLECTOMY     at age 19   Social History   Occupational History  . Not on file  Tobacco Use  . Smoking status: Never Smoker  . Smokeless tobacco: Never Used  Vaping Use  . Vaping Use: Never used  Substance and Sexual Activity  . Alcohol use: Yes    Alcohol/week: 1.0 standard drink    Types: 1 Glasses of wine per week    Comment: yearly  . Drug use: Never  . Sexual activity: Not on file

## 2020-06-12 ENCOUNTER — Inpatient Hospital Stay: Payer: Medicare Other

## 2020-06-12 ENCOUNTER — Ambulatory Visit (HOSPITAL_BASED_OUTPATIENT_CLINIC_OR_DEPARTMENT_OTHER)
Admission: RE | Admit: 2020-06-12 | Discharge: 2020-06-12 | Disposition: A | Discharge status: Home / Self Care | Payer: Medicare Other | Source: Ambulatory Visit | Attending: Hematology & Oncology | Admitting: Hematology & Oncology

## 2020-06-12 ENCOUNTER — Telehealth: Payer: Self-pay

## 2020-06-12 ENCOUNTER — Other Ambulatory Visit: Payer: Self-pay

## 2020-06-12 ENCOUNTER — Inpatient Hospital Stay: Payer: Medicare Other | Attending: Hematology & Oncology | Admitting: Hematology & Oncology

## 2020-06-12 ENCOUNTER — Encounter: Payer: Self-pay | Admitting: Hematology & Oncology

## 2020-06-12 ENCOUNTER — Encounter (HOSPITAL_BASED_OUTPATIENT_CLINIC_OR_DEPARTMENT_OTHER): Payer: Self-pay

## 2020-06-12 VITALS — BP 166/74 | HR 61 | Temp 97.6°F | Resp 20 | Wt 101.0 lb

## 2020-06-12 DIAGNOSIS — D500 Iron deficiency anemia secondary to blood loss (chronic): Secondary | ICD-10-CM | POA: Diagnosis not present

## 2020-06-12 DIAGNOSIS — R1900 Intra-abdominal and pelvic swelling, mass and lump, unspecified site: Secondary | ICD-10-CM | POA: Diagnosis not present

## 2020-06-12 DIAGNOSIS — C7A010 Malignant carcinoid tumor of the duodenum: Secondary | ICD-10-CM | POA: Diagnosis present

## 2020-06-12 LAB — CBC WITH DIFFERENTIAL (CANCER CENTER ONLY)
Abs Immature Granulocytes: 0.04 10*3/uL (ref 0.00–0.07)
Basophils Absolute: 0.1 10*3/uL (ref 0.0–0.1)
Basophils Relative: 1 %
Eosinophils Absolute: 0.1 10*3/uL (ref 0.0–0.5)
Eosinophils Relative: 1 %
HCT: 41.7 % (ref 36.0–46.0)
Hemoglobin: 13.3 g/dL (ref 12.0–15.0)
Immature Granulocytes: 0 %
Lymphocytes Relative: 51 %
Lymphs Abs: 6.3 10*3/uL — ABNORMAL HIGH (ref 0.7–4.0)
MCH: 29.1 pg (ref 26.0–34.0)
MCHC: 31.9 g/dL (ref 30.0–36.0)
MCV: 91.2 fL (ref 80.0–100.0)
Monocytes Absolute: 0.8 10*3/uL (ref 0.1–1.0)
Monocytes Relative: 7 %
Neutro Abs: 4.9 10*3/uL (ref 1.7–7.7)
Neutrophils Relative %: 40 %
Platelet Count: 197 10*3/uL (ref 150–400)
RBC: 4.57 MIL/uL (ref 3.87–5.11)
RDW: 13.7 % (ref 11.5–15.5)
WBC Count: 12.3 10*3/uL — ABNORMAL HIGH (ref 4.0–10.5)
nRBC: 0 % (ref 0.0–0.2)

## 2020-06-12 LAB — CMP (CANCER CENTER ONLY)
ALT: 17 U/L (ref 0–44)
AST: 15 U/L (ref 15–41)
Albumin: 4.8 g/dL (ref 3.5–5.0)
Alkaline Phosphatase: 49 U/L (ref 38–126)
Anion gap: 8 (ref 5–15)
BUN: 28 mg/dL — ABNORMAL HIGH (ref 8–23)
CO2: 27 mmol/L (ref 22–32)
Calcium: 10.7 mg/dL — ABNORMAL HIGH (ref 8.9–10.3)
Chloride: 102 mmol/L (ref 98–111)
Creatinine: 1.14 mg/dL — ABNORMAL HIGH (ref 0.44–1.00)
GFR, Estimated: 46 mL/min — ABNORMAL LOW (ref >60)
Glucose, Bld: 126 mg/dL — ABNORMAL HIGH (ref 70–99)
Potassium: 4.2 mmol/L (ref 3.5–5.1)
Sodium: 137 mmol/L (ref 135–145)
Total Bilirubin: 0.5 mg/dL (ref 0.3–1.2)
Total Protein: 7.8 g/dL (ref 6.5–8.1)

## 2020-06-12 LAB — IRON AND TIBC
Iron: 90 ug/dL (ref 28–170)
Saturation Ratios: 25 % (ref 10.4–31.8)
TIBC: 365 ug/dL (ref 250–450)
UIBC: 275 ug/dL

## 2020-06-12 LAB — LACTATE DEHYDROGENASE: LDH: 157 U/L (ref 98–192)

## 2020-06-12 LAB — FERRITIN: Ferritin: 136 ng/mL (ref 11–307)

## 2020-06-12 MED ORDER — IOHEXOL 300 MG/ML  SOLN
100.0000 mL | Freq: Once | INTRAMUSCULAR | Status: AC | PRN
  Administered 2020-06-12: 80 mL via INTRAVENOUS

## 2020-06-12 NOTE — Progress Notes (Signed)
Hematology and Oncology Follow Up Visit  Deanna [name] [id number] [birth year] 87 y.o. 06/12/2020   Principle Diagnosis:   Abdominal carcinoid  Iron deficiency anemia-blood loss   Current Therapy:    IV iron-Feraheme given on 01/22/2020     Interim History:  Deanna [name] is back for follow-up.  We last saw her back in March.  Since then, she has been doing okay although she did have a fall.  She apparently fell backwards and sustained a hairline fracture of her right wrist.  She had a soft cast on this.  This has come off.  She certainly has recovered nicely which is good to see.  We did go ahead and do a CT scan on her today.  Thankfully, the carcinoid has shrunk in size.  It is now 3.4 x 1.8 cm in size.  I am very happy about the fact that this is smaller and we have yet to do any intervention.  Her last Chromogranin A level back in early March was 255.  She seems to be managing pretty well.  She is eating okay.  She is having no problems with nausea or vomiting.  There is no diarrhea.  She has had no issues with cough or chest wall pain.  Overall, I would have to say that her performance status is ECOG 3.   Medications:  Current Outpatient Medications:  .  acetaminophen (TYLENOL) 325 MG tablet, Take 650 mg by mouth 2 (two) times a day. , Disp: , Rfl:  .  buPROPion (WELLBUTRIN XL) 150 MG 24 hr tablet, Take 150 mg by mouth daily., Disp: , Rfl:  .  carvedilol (COREG) 25 MG tablet, Take 1 tablet (25 mg total) by mouth 2 (two) times daily with a meal., Disp: , Rfl:  .  Cyanocobalamin (VITAMIN B 12 PO), Take 1,000 mcg by mouth daily., Disp: , Rfl:  .  cyclobenzaprine (FLEXERIL) 5 MG tablet, Take 5 mg by mouth every 8 (eight) hours as needed for muscle spasms., Disp: , Rfl:  .  cycloSPORINE (RESTASIS) 0.05 % ophthalmic emulsion, Place 1 drop into both eyes 2 (two) times daily., Disp: , Rfl:  .  denosumab (PROLIA) 60 MG/ML SOLN injection, Inject 60 mg into the skin every 6 (six) months.  Administer in upper arm, thigh, or abdomen, Disp: , Rfl:  .  diclofenac Sodium (VOLTAREN) 1 % GEL, Apply 2 g topically 4 (four) times daily as needed (painful joints)., Disp: , Rfl:  .  escitalopram (LEXAPRO) 20 MG tablet, Take 20 mg by mouth daily., Disp: , Rfl:  .  esomeprazole (NEXIUM) 40 MG capsule, Take 40 mg by mouth daily at 12 noon., Disp: , Rfl:  .  feeding supplement, ENSURE ENLIVE, (ENSURE ENLIVE) LIQD, Take 237 mLs by mouth 3 (three) times daily between meals., Disp: 237 mL, Rfl: 12 .  ferrous sulfate 325 (65 FE) MG tablet, Take 1 tablet (325 mg total) by mouth daily with breakfast., Disp: , Rfl:  .  FREESTYLE LITE test strip, , Disp: , Rfl:  .  hydrochlorothiazide (MICROZIDE) 12.5 MG capsule, Take 12.5 mg by mouth daily., Disp: , Rfl:  .  insulin glargine (LANTUS) 100 UNIT/ML injection, Inject 0.05 mLs (5 Units total) into the skin daily. (Patient taking differently: Inject 18 Units into the skin daily.), Disp: , Rfl:  .  Lancets (FREESTYLE) lancets, 1 each daily., Disp: , Rfl:  .  metFORMIN (GLUCOPHAGE) 500 MG tablet, Take 500 mg by mouth daily with breakfast., Disp: , Rfl:  .  pregabalin (LYRICA) 75 MG capsule, Take 1 capsule (75 mg total) by mouth 2 (two) times daily., Disp: 10 capsule, Rfl: 0 .  Probiotic Product (PROBIOTIC-10 PO), Take 1 capsule by mouth daily., Disp: , Rfl:  .  rosuvastatin (CRESTOR) 10 MG tablet, Take 10 mg by mouth at bedtime. , Disp: , Rfl:  .  SURE COMFORT PEN NEEDLES 31G X 5 MM MISC, , Disp: , Rfl:  .  SYMAX-SR 0.375 MG 12 hr tablet, Take 0.375 mg by mouth every 12 (twelve) hours as needed for pain., Disp: , Rfl:  .  traMADol (ULTRAM) 50 MG tablet, Take 50 mg by mouth 3 (three) times daily as needed., Disp: , Rfl:  .  VITAMIN D, CHOLECALCIFEROL, PO, Take 400 Units by mouth daily. , Disp: , Rfl:  .  vitamin E 400 UNIT capsule, Take 400 Units by mouth 2 (two) times a day. , Disp: , Rfl:  .  amLODipine (NORVASC) 5 MG tablet, Take 1 tablet (5 mg total) by mouth  daily., Disp: 90 tablet, Rfl: 3 .  budesonide (ENTOCORT EC) 3 MG 24 hr capsule, Take 9 mg by mouth every morning. (Patient not taking: Reported on 06/12/2020), Disp: , Rfl:  .  loperamide (IMODIUM A-D) 2 MG tablet, Take 2 mg by mouth 4 (four) times daily as needed for diarrhea or loose stools. (Patient not taking: Reported on 06/12/2020), Disp: , Rfl:  .  mesalamine (LIALDA) 1.2 g EC tablet, Take 2.4 g by mouth daily with breakfast. (Patient not taking: Reported on 06/12/2020), Disp: , Rfl:  .  NOVOLOG FLEXPEN 100 UNIT/ML FlexPen, Inject into the skin. (Patient not taking: Reported on 06/12/2020), Disp: , Rfl:   Allergies:  Allergies  Allergen Reactions  . Codeine Nausea Only    Past Medical History, Surgical history, Social history, and Family History were reviewed and updated.  Review of Systems: Review of Systems  Constitutional: Positive for fatigue.  HENT:  Negative.   Eyes: Negative.   Respiratory: Positive for shortness of breath.   Cardiovascular: Positive for leg swelling.  Gastrointestinal: Positive for abdominal pain.  Endocrine: Negative.   Genitourinary: Negative.    Musculoskeletal: Positive for arthralgias and myalgias.  Skin: Negative.   Neurological: Positive for dizziness.  Hematological: Negative.   Psychiatric/Behavioral: Negative.     Physical Exam:  weight is 101 lb 0.3 oz (45.8 kg). Her oral temperature is 97.6 F (36.4 C). Her blood pressure is 166/74 (abnormal) and her pulse is 61. Her respiration is 20 and oxygen saturation is 100%.   Wt Readings from Last 3 Encounters:  06/12/20 101 lb 0.3 oz (45.8 kg)  06/10/20 106 lb (48.1 kg)  05/28/20 106 lb (48.1 kg)    Physical Exam Vitals reviewed.  HENT:     Head: Normocephalic and atraumatic.  Eyes:     Pupils: Pupils are equal, round, and reactive to light.  Cardiovascular:     Rate and Rhythm: Normal rate and regular rhythm.     Heart sounds: Normal heart sounds.     Comments: Cardiac exam shows a  regular rate and rhythm with occasional extra beat.  She has a 1/6 systolic ejection murmur. Pulmonary:     Effort: Pulmonary effort is normal.     Breath sounds: Normal breath sounds.  Abdominal:     General: Bowel sounds are normal.     Palpations: Abdomen is soft.     Comments: Her abdominal exam is soft.  Bowel sounds are present.  There is no  obvious fluid wave.  There is no obvious abdominal mass.  Musculoskeletal:        General: No tenderness or deformity. Normal range of motion.     Cervical back: Normal range of motion.     Comments: Extremities shows some symmetric weakness in upper and lower extremities.  She has some arthritic changes in her joints.  She has decent pulses in her distal extremities.  She has some trace edema in her lower legs.  Lymphadenopathy:     Cervical: No cervical adenopathy.  Skin:    General: Skin is warm and dry.     Findings: No erythema or rash.  Neurological:     Mental Status: She is alert and oriented to person, place, and time.  Psychiatric:        Behavior: Behavior normal.        Thought Content: Thought content normal.        Judgment: Judgment normal.      Lab Results  Component Value Date   WBC 12.3 (H) 06/12/2020   HGB 13.3 06/12/2020   HCT 41.7 06/12/2020   MCV 91.2 06/12/2020   PLT 197 06/12/2020     Chemistry      Component Value Date/Time   NA 137 06/12/2020 0925   K 4.2 06/12/2020 0925   CL 102 06/12/2020 0925   CO2 27 06/12/2020 0925   BUN 28 (H) 06/12/2020 0925   CREATININE 1.14 (H) 06/12/2020 0925      Component Value Date/Time   CALCIUM 10.7 (H) 06/12/2020 0925   ALKPHOS 49 06/12/2020 0925   AST 15 06/12/2020 0925   ALT 17 06/12/2020 0925   BILITOT 0.5 06/12/2020 0925       Impression and Plan: Deanna [name] is a very charming 87 year old white female.  Looks like she has carcinoid.  This does not appear to be metastatic.  I would have to think that this is something that is going to be slow growing because  of the calcifications.  Clearly, the CT scan shows that the biology of this carcinoid is not aggressive by any means.  I think we can now try to get her through the entire summer.  I feel confident about getting her back after Labor Day and rechecking the CT scan.  I am just happy that overall, her quality of life seems to be managing pretty nicely.  Her family is doing a fantastic job with her.   Volanda Napoleon, MD 5/5/202211:41 AM

## 2020-06-12 NOTE — Telephone Encounter (Signed)
appts made and printed for pt per 06/12/20 los, pt aware to stop by imaging and can p/u contrast for scan in 10/2020   Deanna [name]

## 2020-06-13 LAB — CHROMOGRANIN A: Chromogranin A (ng/mL): 976.8 ng/mL — ABNORMAL HIGH (ref 0.0–101.8)

## 2020-06-16 ENCOUNTER — Encounter (INDEPENDENT_AMBULATORY_CARE_PROVIDER_SITE_OTHER): Payer: Medicare Other | Admitting: Ophthalmology

## 2020-06-17 ENCOUNTER — Ambulatory Visit: Payer: Medicare Other | Admitting: Orthopaedic Surgery

## 2020-06-30 ENCOUNTER — Ambulatory Visit: Payer: Medicare Other | Admitting: Cardiovascular Disease

## 2020-07-02 ENCOUNTER — Encounter (INDEPENDENT_AMBULATORY_CARE_PROVIDER_SITE_OTHER): Payer: Medicare Other | Admitting: Ophthalmology

## 2020-07-09 ENCOUNTER — Telehealth: Payer: Self-pay | Admitting: Cardiovascular Disease

## 2020-07-09 NOTE — Telephone Encounter (Signed)
Attempted to call patient, left message for patient to call back to office.   

## 2020-07-09 NOTE — Telephone Encounter (Signed)
Patient wants office to send blood work orders over to Dr. Raul Del office

## 2020-07-14 ENCOUNTER — Telehealth: Payer: Self-pay | Admitting: Cardiovascular Disease

## 2020-07-14 DIAGNOSIS — I10 Essential (primary) hypertension: Secondary | ICD-10-CM

## 2020-07-14 DIAGNOSIS — Z5181 Encounter for therapeutic drug level monitoring: Secondary | ICD-10-CM

## 2020-07-14 NOTE — Telephone Encounter (Signed)
Reviewed chart and Dr Oval Linsey wanted labs her PCP did faxed to her for review, she did not actually order labs  Left message to call back

## 2020-07-14 NOTE — Telephone Encounter (Signed)
Pt's daughter Jackelyn Poling is calling in regards to a Lab Order being faxed to Dr. Veva Holes office @ [facility] [phone]

## 2020-07-14 NOTE — Telephone Encounter (Signed)
I seen that you had requested the patient send labs from PCP to [place], I did not see any recent orders?  Will route to primary nurse to advise.   Thank you!

## 2020-07-18 NOTE — Telephone Encounter (Signed)
Luisa Dago RN left another message for call back

## 2020-07-18 NOTE — Telephone Encounter (Signed)
Left message for pt to call.

## 2020-07-18 NOTE — Telephone Encounter (Signed)
Faxed, confirmation received 

## 2020-07-18 NOTE — Telephone Encounter (Signed)
Spoke with daughter and will fax order for BMET since patient had no labs at PCP

## 2020-08-06 ENCOUNTER — Encounter: Payer: Self-pay | Admitting: Family

## 2020-09-23 ENCOUNTER — Telehealth: Payer: Self-pay

## 2020-10-20 ENCOUNTER — Other Ambulatory Visit (HOSPITAL_BASED_OUTPATIENT_CLINIC_OR_DEPARTMENT_OTHER): Payer: Medicare Other

## 2020-10-20 ENCOUNTER — Other Ambulatory Visit: Payer: Medicare Other

## 2020-10-20 ENCOUNTER — Ambulatory Visit: Payer: Medicare Other | Admitting: Hematology & Oncology

## 2020-10-21 ENCOUNTER — Inpatient Hospital Stay: Payer: Medicare Other

## 2020-10-21 ENCOUNTER — Encounter: Payer: Self-pay | Admitting: Hematology & Oncology

## 2020-10-21 ENCOUNTER — Other Ambulatory Visit: Payer: Self-pay

## 2020-10-21 ENCOUNTER — Ambulatory Visit (HOSPITAL_BASED_OUTPATIENT_CLINIC_OR_DEPARTMENT_OTHER)
Admission: RE | Admit: 2020-10-21 | Discharge: 2020-10-21 | Disposition: A | Discharge status: Home / Self Care | Payer: Medicare Other | Source: Ambulatory Visit | Attending: Hematology & Oncology | Admitting: Hematology & Oncology

## 2020-10-21 ENCOUNTER — Encounter (HOSPITAL_BASED_OUTPATIENT_CLINIC_OR_DEPARTMENT_OTHER): Payer: Self-pay

## 2020-10-21 ENCOUNTER — Inpatient Hospital Stay: Payer: Medicare Other | Attending: Hematology & Oncology | Admitting: Hematology & Oncology

## 2020-10-21 VITALS — BP 141/57 | HR 59 | Temp 97.8°F | Resp 19 | Ht 62.0 in | Wt 102.1 lb

## 2020-10-21 DIAGNOSIS — C7A010 Malignant carcinoid tumor of the duodenum: Secondary | ICD-10-CM | POA: Diagnosis present

## 2020-10-21 DIAGNOSIS — D500 Iron deficiency anemia secondary to blood loss (chronic): Secondary | ICD-10-CM | POA: Diagnosis not present

## 2020-10-21 DIAGNOSIS — R1900 Intra-abdominal and pelvic swelling, mass and lump, unspecified site: Secondary | ICD-10-CM | POA: Diagnosis present

## 2020-10-21 LAB — CMP (CANCER CENTER ONLY)
ALT: 11 U/L (ref 0–44)
AST: 14 U/L — ABNORMAL LOW (ref 15–41)
Albumin: 4.6 g/dL (ref 3.5–5.0)
Alkaline Phosphatase: 44 U/L (ref 38–126)
Anion gap: 9 (ref 5–15)
BUN: 38 mg/dL — ABNORMAL HIGH (ref 8–23)
CO2: 25 mmol/L (ref 22–32)
Calcium: 10.7 mg/dL — ABNORMAL HIGH (ref 8.9–10.3)
Chloride: 103 mmol/L (ref 98–111)
Creatinine: 1.36 mg/dL — ABNORMAL HIGH (ref 0.44–1.00)
GFR, Estimated: 37 mL/min — ABNORMAL LOW (ref >60)
Glucose, Bld: 104 mg/dL — ABNORMAL HIGH (ref 70–99)
Potassium: 4.3 mmol/L (ref 3.5–5.1)
Sodium: 137 mmol/L (ref 135–145)
Total Bilirubin: 0.6 mg/dL (ref 0.3–1.2)
Total Protein: 7.5 g/dL (ref 6.5–8.1)

## 2020-10-21 LAB — CBC WITH DIFFERENTIAL (CANCER CENTER ONLY)
Abs Immature Granulocytes: 0.04 10*3/uL (ref 0.00–0.07)
Basophils Absolute: 0.2 10*3/uL — ABNORMAL HIGH (ref 0.0–0.1)
Basophils Relative: 1 %
Eosinophils Absolute: 0.1 10*3/uL (ref 0.0–0.5)
Eosinophils Relative: 1 %
HCT: 37.6 % (ref 36.0–46.0)
Hemoglobin: 12.1 g/dL (ref 12.0–15.0)
Immature Granulocytes: 0 %
Lymphocytes Relative: 45 %
Lymphs Abs: 5.4 10*3/uL — ABNORMAL HIGH (ref 0.7–4.0)
MCH: 30.1 pg (ref 26.0–34.0)
MCHC: 32.2 g/dL (ref 30.0–36.0)
MCV: 93.5 fL (ref 80.0–100.0)
Monocytes Absolute: 0.7 10*3/uL (ref 0.1–1.0)
Monocytes Relative: 5 %
Neutro Abs: 5.7 10*3/uL (ref 1.7–7.7)
Neutrophils Relative %: 48 %
Platelet Count: 177 10*3/uL (ref 150–400)
RBC: 4.02 MIL/uL (ref 3.87–5.11)
RDW: 13.5 % (ref 11.5–15.5)
WBC Count: 12.1 10*3/uL — ABNORMAL HIGH (ref 4.0–10.5)
nRBC: 0 % (ref 0.0–0.2)

## 2020-10-21 MED ORDER — IOHEXOL 350 MG/ML SOLN
100.0000 mL | Freq: Once | INTRAVENOUS | Status: AC | PRN
  Administered 2020-10-21: 68 mL via INTRAVENOUS

## 2020-10-21 NOTE — Progress Notes (Signed)
Hematology and Oncology Follow Up Visit  Deanna [name] [id number] 10/07/1930 87 y.o. 10/21/2020   Principle Diagnosis:  Abdominal carcinoid Iron deficiency anemia-blood loss   Current Therapy:   IV iron-Feraheme given on 01/22/2020     Interim History:  Deanna [name] is back for follow-up.  We last saw her back in May.  Over the summertime, she has been doing okay.  She really has had no specific complaints.  She has had no problems with abdominal pain.  There is no change in bowel or bladder habits.  She has had no problems with diarrhea.  When we last saw her in May, her iron studies showed a ferritin of 136 with an iron saturation of 25%.  She did have a CT scan done today.  Thankfully, the mass is stable at 3.6 cm.    We last saw her, her chromogranin A level was 976.  This does tend to fluctuate.  She has had no cough or shortness of breath.  There is been no issues with COVID.  She has had no leg swelling.  I do not think she has had issues with falling.  Overall, her performance status is ECOG 2.     Medications:  Current Outpatient Medications:    acetaminophen (TYLENOL) 325 MG tablet, Take 650 mg by mouth 2 (two) times a day. , Disp: , Rfl:    buPROPion (WELLBUTRIN XL) 150 MG 24 hr tablet, Take 150 mg by mouth daily., Disp: , Rfl:    carvedilol (COREG) 25 MG tablet, Take 1 tablet (25 mg total) by mouth 2 (two) times daily with a meal., Disp: , Rfl:    Cyanocobalamin (VITAMIN B 12 PO), Take 1,000 mcg by mouth daily., Disp: , Rfl:    cyclobenzaprine (FLEXERIL) 5 MG tablet, Take 5 mg by mouth every 8 (eight) hours as needed for muscle spasms., Disp: , Rfl:    cycloSPORINE (RESTASIS) 0.05 % ophthalmic emulsion, Place 1 drop into both eyes 2 (two) times daily., Disp: , Rfl:    denosumab (PROLIA) 60 MG/ML SOLN injection, Inject 60 mg into the skin every 6 (six) months. Administer in upper arm, thigh, or abdomen, Disp: , Rfl:    diclofenac Sodium (VOLTAREN) 1 % GEL, Apply 2 g  topically 4 (four) times daily as needed (painful joints)., Disp: , Rfl:    escitalopram (LEXAPRO) 20 MG tablet, Take 20 mg by mouth daily., Disp: , Rfl:    esomeprazole (NEXIUM) 40 MG capsule, Take 40 mg by mouth daily at 12 noon., Disp: , Rfl:    feeding supplement, ENSURE ENLIVE, (ENSURE ENLIVE) LIQD, Take 237 mLs by mouth 3 (three) times daily between meals., Disp: 237 mL, Rfl: 12   ferrous sulfate 325 (65 FE) MG tablet, Take 1 tablet (325 mg total) by mouth daily with breakfast., Disp: , Rfl:    FREESTYLE LITE test strip, , Disp: , Rfl:    hydrochlorothiazide (MICROZIDE) 12.5 MG capsule, Take 12.5 mg by mouth daily., Disp: , Rfl:    insulin glargine (LANTUS) 100 UNIT/ML injection, Inject 0.05 mLs (5 Units total) into the skin daily. (Patient taking differently: Inject 18 Units into the skin daily.), Disp: , Rfl:    Lancets (FREESTYLE) lancets, 1 each daily., Disp: , Rfl:    loperamide (IMODIUM A-D) 2 MG tablet, Take 2 mg by mouth 4 (four) times daily as needed for diarrhea or loose stools., Disp: , Rfl:    metFORMIN (GLUCOPHAGE) 500 MG tablet, Take 500 mg by mouth daily  with breakfast., Disp: , Rfl:    NOVOLOG FLEXPEN 100 UNIT/ML FlexPen, Inject into the skin., Disp: , Rfl:    pregabalin (LYRICA) 75 MG capsule, Take 1 capsule (75 mg total) by mouth 2 (two) times daily., Disp: 10 capsule, Rfl: 0   Probiotic Product (PROBIOTIC-10 PO), Take 1 capsule by mouth daily., Disp: , Rfl:    rosuvastatin (CRESTOR) 10 MG tablet, Take 10 mg by mouth at bedtime. , Disp: , Rfl:    SURE COMFORT PEN NEEDLES 31G X 5 MM MISC, , Disp: , Rfl:    SYMAX-SR 0.375 MG 12 hr tablet, Take 0.375 mg by mouth every 12 (twelve) hours as needed for pain., Disp: , Rfl:    traMADol (ULTRAM) 50 MG tablet, Take 50 mg by mouth 3 (three) times daily as needed., Disp: , Rfl:    VITAMIN D, CHOLECALCIFEROL, PO, Take 400 Units by mouth daily. , Disp: , Rfl:    vitamin E 400 UNIT capsule, Take 400 Units by mouth 2 (two) times a day. ,  Disp: , Rfl:    amLODipine (NORVASC) 5 MG tablet, Take 1 tablet (5 mg total) by mouth daily., Disp: 90 tablet, Rfl: 3   budesonide (ENTOCORT EC) 3 MG 24 hr capsule, Take 9 mg by mouth every morning. (Patient not taking: Reported on 10/21/2020), Disp: , Rfl:    mesalamine (LIALDA) 1.2 g EC tablet, Take 2.4 g by mouth daily with breakfast. (Patient not taking: No sig reported), Disp: , Rfl:   Allergies:  Allergies  Allergen Reactions   Codeine Nausea Only    Past Medical History, Surgical history, Social history, and Family History were reviewed and updated.  Review of Systems: Review of Systems  Constitutional:  Positive for fatigue.  HENT:  Negative.    Eyes: Negative.   Respiratory:  Positive for shortness of breath.   Cardiovascular:  Positive for leg swelling.  Gastrointestinal:  Positive for abdominal pain.  Endocrine: Negative.   Genitourinary: Negative.    Musculoskeletal:  Positive for arthralgias and myalgias.  Skin: Negative.   Neurological:  Positive for dizziness.  Hematological: Negative.   Psychiatric/Behavioral: Negative.     Physical Exam:  height is '5\' 2"'$  (1.575 m) and weight is 102 lb 1.6 oz (46.3 kg). Her oral temperature is 97.8 F (36.6 C). Her blood pressure is 141/57 (abnormal) and her pulse is 59 (abnormal). Her respiration is 19 and oxygen saturation is 100%.   Wt Readings from Last 3 Encounters:  10/21/20 102 lb 1.6 oz (46.3 kg)  06/12/20 101 lb 0.3 oz (45.8 kg)  06/10/20 106 lb (48.1 kg)    Physical Exam Vitals reviewed.  HENT:     Head: Normocephalic and atraumatic.  Eyes:     Pupils: Pupils are equal, round, and reactive to light.  Cardiovascular:     Rate and Rhythm: Normal rate and regular rhythm.     Heart sounds: Normal heart sounds.     Comments: Cardiac exam shows a regular rate and rhythm with occasional extra beat.  She has a 1/6 systolic ejection murmur. Pulmonary:     Effort: Pulmonary effort is normal.     Breath sounds: Normal  breath sounds.  Abdominal:     General: Bowel sounds are normal.     Palpations: Abdomen is soft.     Comments: Her abdominal exam is soft.  Bowel sounds are present.  There is no obvious fluid wave.  There is no obvious abdominal mass.  Musculoskeletal:  General: No tenderness or deformity. Normal range of motion.     Cervical back: Normal range of motion.     Comments: Extremities shows some symmetric weakness in upper and lower extremities.  She has some arthritic changes in her joints.  She has decent pulses in her distal extremities.  She has some trace edema in her lower legs.  Lymphadenopathy:     Cervical: No cervical adenopathy.  Skin:    General: Skin is warm and dry.     Findings: No erythema or rash.  Neurological:     Mental Status: She is alert and oriented to person, place, and time.  Psychiatric:        Behavior: Behavior normal.        Thought Content: Thought content normal.        Judgment: Judgment normal.     Lab Results  Component Value Date   WBC 12.1 (H) 10/21/2020   HGB 12.1 10/21/2020   HCT 37.6 10/21/2020   MCV 93.5 10/21/2020   PLT 177 10/21/2020     Chemistry      Component Value Date/Time   NA 137 10/21/2020 1425   K 4.3 10/21/2020 1425   CL 103 10/21/2020 1425   CO2 25 10/21/2020 1425   BUN 38 (H) 10/21/2020 1425   CREATININE 1.36 (H) 10/21/2020 1425      Component Value Date/Time   CALCIUM 10.7 (H) 10/21/2020 1425   ALKPHOS 44 10/21/2020 1425   AST 14 (L) 10/21/2020 1425   ALT 11 10/21/2020 1425   BILITOT 0.6 10/21/2020 1425       Impression and Plan: Ms. Mathiasen is a very charming 87 year old white female.  Looks like she has carcinoid.  This does not appear to be metastatic.  I would have to think that this is something that is going to be slow growing because of the calcifications.  Clearly, the CT scan shows that the biology of this carcinoid is not aggressive by any means.  I think we can now try to get her through the  holiday season.  We will plan to get her back in January.  If the CT scan looks good in January, then I think we can move her to every 6 months.  I think this would make her very happy.  Marland Kitchen   Volanda Napoleon, MD 9/13/20224:06 PM

## 2020-10-22 ENCOUNTER — Telehealth: Payer: Self-pay

## 2020-10-23 LAB — CHROMOGRANIN A: Chromogranin A (ng/mL): 1574 ng/mL — ABNORMAL HIGH (ref 0.0–101.8)

## 2020-11-26 ENCOUNTER — Other Ambulatory Visit: Payer: Self-pay

## 2020-11-26 ENCOUNTER — Encounter (HOSPITAL_BASED_OUTPATIENT_CLINIC_OR_DEPARTMENT_OTHER): Payer: Self-pay | Admitting: Cardiovascular Disease

## 2020-11-26 ENCOUNTER — Ambulatory Visit (HOSPITAL_BASED_OUTPATIENT_CLINIC_OR_DEPARTMENT_OTHER): Payer: Medicare Other | Admitting: Cardiovascular Disease

## 2020-11-26 VITALS — BP 110/70 | HR 51 | Ht 62.0 in | Wt 107.5 lb

## 2020-11-26 DIAGNOSIS — I480 Paroxysmal atrial fibrillation: Secondary | ICD-10-CM

## 2020-11-26 DIAGNOSIS — I10 Essential (primary) hypertension: Secondary | ICD-10-CM | POA: Diagnosis not present

## 2020-11-26 DIAGNOSIS — E7800 Pure hypercholesterolemia, unspecified: Secondary | ICD-10-CM | POA: Diagnosis not present

## 2020-11-26 HISTORY — DX: Pure hypercholesterolemia, unspecified: E78.00

## 2020-11-26 MED ORDER — AMLODIPINE BESYLATE 10 MG PO TABS: 10.0000 mg | ORAL_TABLET | Freq: Every day | ORAL | 3 refills | Status: DC

## 2020-11-26 MED ORDER — AMLODIPINE BESYLATE 10 MG PO TABS: 10.0000 mg | ORAL_TABLET | Freq: Every day | ORAL | 3 refills | Status: AC

## 2020-11-26 NOTE — Progress Notes (Signed)
.    Cardiology Office Note   Date:  11/26/2020   ID:  Deanna [name], DOB 05/09/1930, MRN [id number]  PCP:  Ginger Organ., MD  Cardiologist:   [name], MD   No chief complaint on file.   History of Present Illness: Deanna [name] is a 87 y.o. female with paroxysmal atrial fibrillation, chronic left bundle branch block, diabetes, carcinoid, and pulmonary hypertension who presents for follow-up.  She was first diagnosed with atrial fibrillation 07/2019 in Deanna setting of sepsis and colitis.  Her heart rates were controlled and she was started on Eliquis.  Echo that admission revealed LVEF 60 to 65% with moderate LVH and indeterminate diastolic function.  However she developed GI bleeding requiring transfusions her Eliquis was discontinued.  She was subsequently hospitalized at [facility] 08/2019 with a transfusion reaction.  She followed up with atrial fibrillation clinic 08/2019 and was maintaining sinus rhythm.  She has also struggled with symptoms of carcinoid.  She was found to have a mesenteric mass and elevated Chromogranin A levels.  She follows with Dr. Marin Olp for this.  She was treated with a round of steroids and her diarrhea has improved.  She continued to have occasional flushing.  Deanna [name] had been feeling well. She had a fall in December 2021 and had a hairline fracture of Deanna hip that didn't require surgery.  Since her last appointment, lasix was stopped by her PCP.   Today, she is accompanied by a family member. Overall, she is doing well and she has nothing major to report. Rarely, she experiences chest discomfort that lasts about 15 seconds or less. This discomfort occurs primarily at rest. For Deanna past month, she experiences a tender pain in Deanna posterior R leg just below Deanna knee. When she feels this pain, she puts heat on it which seems to help. Deanna pain worsens with exercise. In September, she reports experiencing a few falls. Once, she was returning from a walk  and fell in Deanna driveway. Another fall occurred after church when she arrived home but did no strenuous activity. Another episode occurred in Deanna store when she lost her balance getting a flower pot. She has stopped driving after these falling episodes. She associates some falls to Deanna heat and dehydration. Reportedly, her blood pressure measurements at home have been good. For exercises, she attends exercise classes twice a week and intends to join a chair exercise class once a week, She tries to walk as often as possible. She reports not drinking fluids often. 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   PAF (paroxysmal atrial fibrillation) ([place]) 07/30/2019   Pure hypercholesterolemia 11/26/2020    Past Surgical History:  Procedure Laterality Date   APPENDECTOMY     with lower back surgery   BACK SURGERY     x2   BIOPSY  08/02/2019   Procedure: BIOPSY;  Surgeon: Yetta Flock, MD;  Location: [facility] ENDOSCOPY;  Service: Gastroenterology;;   [place]  complete left mastectomy   CERVICAL SPINE SURGERY     COLONOSCOPY WITH PROPOFOL N/A 08/02/2019   Procedure: COLONOSCOPY WITH PROPOFOL;  Surgeon: Yetta Flock, MD;  Location: [facility] ENDOSCOPY;  Service: Gastroenterology;  Laterality: N/A;   EYE SURGERY Bilateral    cataracts   TONSILLECTOMY     at age 4     Current Outpatient Medications  Medication Sig Dispense Refill   acetaminophen (TYLENOL) 325 MG tablet Take 650 mg by mouth 2 (two) times a day.       budesonide (ENTOCORT EC) 3 MG 24 hr capsule Take by mouth daily. Per [name] taking 9 mg for 2 weeks, taking 6 mg for  2 weeks, 3mg  for 2 weeks.     buPROPion (WELLBUTRIN XL) 150 MG 24 hr tablet Take 150 mg by mouth daily.     carvedilol (COREG) 25 MG tablet Take 1 tablet (25 mg total) by mouth 2 (two) times daily with a meal.     Cyanocobalamin (VITAMIN B 12 PO) Take 1,000 mcg by mouth daily.     cyclobenzaprine (FLEXERIL) 5 MG tablet Take 5 mg by mouth every 8 (eight) hours as needed for muscle spasms.     cycloSPORINE (RESTASIS) 0.05 % ophthalmic emulsion Place 1 drop into both eyes 2 (two) times daily.     denosumab (PROLIA) 60 MG/ML SOLN injection Inject 60 mg into Deanna skin every 6 (six) months. Administer in upper arm, thigh, or abdomen     diclofenac Sodium (VOLTAREN) 1 % GEL Apply 2 g topically 4 (four) times daily as needed (painful joints).     escitalopram (LEXAPRO) 20 MG tablet Take 20 mg by mouth daily.     esomeprazole (NEXIUM) 40 MG capsule Take 40 mg by mouth daily at 12 noon.     feeding supplement, ENSURE ENLIVE, (ENSURE ENLIVE) LIQD Take 237 mLs by mouth 3 (three) times daily between meals. 237 mL 12   FREESTYLE LITE test strip      insulin glargine (LANTUS) 100 UNIT/ML injection Inject 0.05 mLs (5 Units total) into Deanna skin daily. ([name] taking differently: Inject 18 Units into Deanna skin daily.)     Lancets (FREESTYLE) lancets 1 each daily.     loperamide (IMODIUM A-D) 2 MG tablet Take 2 mg by mouth 4 (four) times daily as needed for diarrhea or loose stools.     NOVOLOG FLEXPEN 100 UNIT/ML FlexPen Inject into Deanna skin.     pregabalin (LYRICA) 75 MG capsule Take 1 capsule (75 mg total) by mouth 2 (two) times daily. 10 capsule 0   Probiotic Product (PROBIOTIC-10 PO) Take 1 capsule by mouth daily.     rosuvastatin (CRESTOR) 10 MG tablet Take 10 mg by mouth at bedtime.      SURE COMFORT PEN NEEDLES 31G X 5 MM MISC      SYMAX-SR 0.375 MG 12 hr tablet Take 0.375 mg by mouth every  12 (twelve) hours as needed for pain.     traMADol (ULTRAM) 50 MG tablet Take 50 mg by mouth 3 (three) times daily as needed.     VITAMIN D, CHOLECALCIFEROL, PO Take 400 Units by mouth daily.      vitamin E 400 UNIT capsule Take 400 Units by mouth 2 (two) times a day.      amLODipine (NORVASC) 10 MG tablet Take 1 tablet (10 mg total) by mouth daily. 90 tablet 3   budesonide (ENTOCORT EC) 3 MG 24 hr capsule Take 9 mg by mouth every  morning. ([name] not taking: Reported on 11/26/2020)     ferrous sulfate 325 (65 FE) MG tablet Take 1 tablet (325 mg total) by mouth daily with breakfast. ([name] not taking: Reported on 11/26/2020)     mesalamine (LIALDA) 1.2 g EC tablet Take 2.4 g by mouth daily with breakfast. ([name] not taking: Reported on 11/26/2020)     metFORMIN (GLUCOPHAGE) 500 MG tablet Take 500 mg by mouth daily with breakfast. ([name] not taking: Reported on 11/26/2020)     No current facility-administered medications for this visit.    Allergies:   Codeine    Social History:  Deanna [name]  reports that she has never smoked. She has never used smokeless tobacco. She reports current alcohol use of about 1.0 standard drink per week. She reports that she does not use drugs.   Family History:  Deanna [name]'s family history includes Heart disease in her father and mother; Hypertension in her father and mother.    ROS:  Please see Deanna history of present illness.    (+) Chest discomfort (+) Leg Pain (Posterior R Leg) (+) Falls All other systems are reviewed and negative.    PHYSICAL EXAM: VS:  BP 110/70   Pulse (!) 51   Ht 5\' 2"  (1.575 m)   Wt 107 lb 8 oz (48.8 kg)   SpO2 97%   BMI 19.66 kg/m  , BMI Body mass index is 19.66 kg/m. GENERAL:  Well appearing HEENT:  Pupils equal round and reactive, fundi not visualized, oral mucosa unremarkable NECK:  No jugular venous distention, waveform within normal limits, carotid upstroke brisk and symmetric, no bruits LUNGS:  Clear to  auscultation bilaterally HEART:  RRR.  PMI not displaced or sustained,S1 and S2 within normal limits, no S3, no S4, no clicks, no rubs, II/VI systolic murmur ABD:  Flat, positive bowel sounds normal in frequency in pitch, no bruits, no rebound, no guarding, no midline pulsatile mass, no hepatomegaly, no splenomegaly EXT:  2 plus pulses throughout, no edema, no cyanosis no clubbing SKIN:  No rashes no nodules NEURO:  Cranial nerves II through XII grossly intact, motor grossly intact throughout PSYCH:  Cognitively intact, oriented to person place and time   EKG:   11/26/2020: Sinus bradycardia Rate 51 bpm Prior anteroseptal infarct 3/22: Sinus rhythm.  Rate 64 bpm.  IVCD.  Prior anterior infarct. 11/29/2019:  sinus rhythm.  Rate 66 bpm.  LBBB.  R axis deviation  Echo 07/31/19   1. Left ventricular ejection fraction, by estimation, is 60 to 65%. Deanna  left ventricle has normal function. Deanna left ventricle has no regional  wall motion abnormalities. There is moderate concentric left ventricular  hypertrophy. Left ventricular  diastolic parameters are indeterminate. Elevated left ventricular  end-diastolic pressure.   2. Right ventricular systolic function is normal. Deanna right ventricular  size is normal. There is mildly elevated pulmonary artery systolic  pressure. Deanna estimated right ventricular systolic pressure is 14.4 mmHg.   3. Deanna mitral valve is normal in structure. Mild mitral valve  regurgitation. No evidence of mitral stenosis.   4. Tricuspid valve regurgitation is moderate.   5. Deanna aortic valve is tricuspid. Aortic valve regurgitation is not  visualized. Mild to moderate aortic valve sclerosis/calcification is  present, without any evidence of aortic stenosis.   6. Deanna inferior vena cava is normal in size with greater than 50%  respiratory variability, suggesting right atrial pressure of 3 mmHg.    Recent Labs: 10/21/2020: ALT 11; BUN 38; Creatinine  1.36; Hemoglobin 12.1;  Platelet Count 177; Potassium 4.3; Sodium 137    Lipid Panel No results found for: CHOL, TRIG, HDL, CHOLHDL, VLDL, LDLCALC, LDLDIRECT    Wt Readings from Last 3 Encounters:  11/26/20 107 lb 8 oz (48.8 kg)  10/21/20 102 lb 1.6 oz (46.3 kg)  06/12/20 101 lb 0.3 oz (45.8 kg)      ASSESSMENT AND PLAN: PAF (paroxysmal atrial fibrillation) (HCC) She is currently in sinus rhythm.  She had atrial fibrillation in Deanna setting of sepsis.  She has not been on anticoagulation due to GI bleed and carcinoid tumor.  Continue carvedilol.  Essential hypertension BP well controlled has been labile.  She is also struggled with dehydration and it seems to be worse when it is hot outside.  We will try stopping her hydrochlorothiazide and increase her amlodipine to 10 mg.  Continue carvedilol.  Pure hypercholesterolemia Continue rosuvastatin.   Current medicines are reviewed at length with Deanna [name] today.  Deanna [name] does not have concerns regarding medicines.  Deanna following changes have been made: Switch diltiazem to amlodipine  Labs/ tests ordered today include:   Orders Placed This Encounter  Procedures   EKG 12-Lead     Disposition:   FU with Simmone [place] C. Oval Linsey, MD, [facility] in 3 months    I,Mykaella Javier,acting as a scribe for [name], MD.,have documented all relevant documentation on Deanna behalf of [name], MD,as directed by  [name], MD while in Deanna presence of [name], MD.     Signed, [place]. Oval Linsey, MD, [facility]  11/26/2020 12:36 PM    [facility]

## 2020-11-26 NOTE — Assessment & Plan Note (Signed)
She is currently in sinus rhythm.  She had atrial fibrillation in the setting of sepsis.  She has not been on anticoagulation due to GI bleed and carcinoid tumor.  Continue carvedilol.

## 2020-11-26 NOTE — Assessment & Plan Note (Signed)
BP well controlled has been labile.  She is also struggled with dehydration and it seems to be worse when it is hot outside.  We will try stopping her hydrochlorothiazide and increase her amlodipine to 10 mg.  Continue carvedilol.

## 2020-11-26 NOTE — Assessment & Plan Note (Signed)
Continue rosuvastatin.  

## 2020-11-26 NOTE — Patient Instructions (Signed)
Medication Instructions:  STOP HYDROCHLOROTHIAZIDE   INCREASE AMLODIPINE 10 MG DAILY   *If you need a refill on your cardiac medications before your next appointment, please call your pharmacy*  Lab Work: NONE   Testing/Procedures: NONE   Follow-Up: At [facility], you and your health needs are our priority.  As part of our continuing mission to provide you with exceptional heart care, we have created designated Provider Care Teams.  These Care Teams include your primary Cardiologist (physician) and Advanced Practice Providers (APPs -  Physician Assistants and Nurse Practitioners) who all work together to provide you with the care you need, when you need it.  We recommend signing up for the patient portal called "MyChart".  Sign up information is provided on this After Visit Summary.  MyChart is used to connect with patients for Virtual Visits (Telemedicine).  Patients are able to view lab/test results, encounter notes, upcoming appointments, etc.  Non-urgent messages can be sent to your provider as well.   To learn more about what you can do with MyChart, go to [email or web address].    Your next appointment:   3 month(s)  The format for your next appointment:   In Person  Provider:   [name], MD

## 2020-11-27 ENCOUNTER — Encounter: Payer: Self-pay | Admitting: Family

## 2020-11-27 ENCOUNTER — Telehealth: Payer: Self-pay | Admitting: Cardiovascular Disease

## 2020-11-27 ENCOUNTER — Ambulatory Visit (HOSPITAL_COMMUNITY)
Admission: RE | Admit: 2020-11-27 | Discharge: 2020-11-27 | Disposition: A | Discharge status: Home / Self Care | Payer: Medicare Other | Source: Ambulatory Visit | Attending: Cardiology | Admitting: Cardiology

## 2020-11-27 DIAGNOSIS — M79604 Pain in right leg: Secondary | ICD-10-CM | POA: Diagnosis not present

## 2020-11-27 NOTE — Telephone Encounter (Signed)
New Message;   Daughter said patient was here to see Dr Oval Linsey . The daughter is calling to say that she thought patient was supposed to had an Ultrasound when she was there, this was decided yesterday. She said there was a fire alarm and after that they went home and did not get the ultrasound. Please advise when patient will have the Ultrasound. Daughter would like to have it today or tomorrow if possible please.

## 2020-11-27 NOTE — Telephone Encounter (Signed)
Returned call to patient's daughter, Jackelyn Poling, who states at the patient's office visit with Dr. Oval Linsey, she clearly stated that she wanted to get an ultrasound of patient's RLE due to tenderness and to rule out a blood clot. She states they left the appointment after a fire alarm and the ultrasound was not done - states she is in town today and tomorrow and would like to bring patient in to any of our offices where u/s can be done. I advised that there is no order for an u/s and no mention from Dr. Oval Linsey about ordering this test. Unfortunately, Dr. Oval Linsey and her primary nurse, Rip Harbour, are out of the office today. Debbie asks if I will send the request to the doctor covering for Dr. Oval Linsey to which I agreed. I advised we will call back once Dr. Percival Spanish has had a chance to review the request. Jackelyn Poling thanked me for the call.

## 2020-11-27 NOTE — Telephone Encounter (Signed)
Called and advised patient's daughter that Dr. Percival Spanish agrees to order the DVT u/s. I advised that I am sending a message to our scheduler to get patient scheduled and someone will reach out to her with the appointment. She verbalized understanding and agreement and thanked me for the call.

## 2021-01-06 ENCOUNTER — Ambulatory Visit (INDEPENDENT_AMBULATORY_CARE_PROVIDER_SITE_OTHER): Payer: Medicare Other | Admitting: Orthopaedic Surgery

## 2021-01-06 ENCOUNTER — Other Ambulatory Visit: Payer: Self-pay

## 2021-01-06 ENCOUNTER — Encounter: Payer: Self-pay | Admitting: Family

## 2021-01-06 ENCOUNTER — Encounter: Payer: Self-pay | Admitting: Orthopaedic Surgery

## 2021-01-06 VITALS — BP 120/60 | Ht 62.0 in | Wt 107.0 lb

## 2021-01-06 DIAGNOSIS — M545 Low back pain, unspecified: Secondary | ICD-10-CM | POA: Diagnosis not present

## 2021-01-06 DIAGNOSIS — G8929 Other chronic pain: Secondary | ICD-10-CM | POA: Diagnosis not present

## 2021-01-06 NOTE — Progress Notes (Signed)
Office Visit Note   Patient: Deanna [name]           Date of Birth: 12/24/1930           MRN: [id number] Visit Date: 01/06/2021              Requested by: Ginger Organ., MD [address] [place],  [place] [address] PCP: Ginger Organ., MD   Assessment & Plan: Visit Diagnoses:  1. Chronic midline low back pain without sciatica     Plan: Patient has lumbar disc degeneration L4-5 L5-S1 with some anterolisthesis and some sciatica on the right side.  We discussed with her anterolisthesis that surgery would involve fusion procedure and it present she not have any pain currently but at times has increased pain late in the day and also in the evening with activities.  She has a care assistant comes in 2 hours a day to help she also likes to get out and do some gardening with sometimes bending turning twisting aggravates her back symptoms as expected.  She states the gardening is something that she really enjoys.  She can follow-up if she develops progressive neurogenic claudication symptoms.  I have discussed with her in detail with her history of multiple falls per year she needs to use her walker at all times to prevent falling.  Her falls have all occurred when she does not have a walker or has it but is not using it.  Follow-Up Instructions: No follow-ups on file.   Orders:  No orders of the defined types were placed in this encounter.  No orders of the defined types were placed in this encounter.     Procedures: No procedures performed   Clinical Data: No additional findings.   Subjective: Chief Complaint  Patient presents with   Right Leg - Pain    Fall 11/04/2020   Lower Back - Pain    Fall 11/04/2020   Left Hip - Pain    Fall 11/04/2020    HPI 87 year old female here with her daughter with problems with pain in her right leg primarily along the lateral calf.  Recent Doppler negative for DVT.  Patient had a fall in September hit her head also had inferior  rami fracture.  Patient states currently she does not hurt that much occasionally when she has pain late in the evening she has to lay down go to bed and take a tramadol which works well.  She has some pain in her lower back.  Patient has a duodenal carcinoid which has been stable on CT comparison in May 2 new imaging September 2022.  CT 06/12/2020 showed right inferior and superior rami fractures right sacral insufficiency fracture.  No worrisome sclerotic or lytic lesions. Patient had previous L5-S1 left HNP surgery.  CT shows disc desiccation at both L4-5 and L5-S1 with air in the disc space and some degenerative anterolisthesis with facet arthritic changes without severe stenosis.  Balance is rated at fair. Review of Systems history of carcinoid being followed with serial CTs.  All other systems noncontributory to HPI.   Objective: Vital Signs: BP 120/60   Ht 5\' 2"  (1.575 m)   Wt 107 lb (48.5 kg)   BMI 19.57 kg/m   Physical Exam Constitutional:      Appearance: She is well-developed.  HENT:     Head: Normocephalic.     Right Ear: External ear normal.     Left Ear: External ear normal. There is  no impacted cerumen.  Eyes:     Pupils: Pupils are equal, round, and reactive to light.  Neck:     Thyroid: No thyromegaly.     Trachea: No tracheal deviation.  Cardiovascular:     Rate and Rhythm: Normal rate.  Pulmonary:     Effort: Pulmonary effort is normal.  Abdominal:     Palpations: Abdomen is soft.  Musculoskeletal:     Cervical back: No rigidity.  Skin:    General: Skin is warm and dry.  Neurological:     Mental Status: She is alert and oriented to person, place, and time.  Psychiatric:        Behavior: Behavior normal.    Ortho Exam healed lumbar incision at L5-S1.  Minimal sciatic notch tenderness negative logroll of the hips distal pulse palpable.  She has some tenderness of the peroneal nerve at the fibular neck.  Anterior tib gastrocsoleus is active.  Mild trochanteric  bursal tenderness right and left.  Specialty Comments:  No specialty comments available.  Imaging: No results found.   PMFS History: Patient Active Problem List   Diagnosis Date Noted   Pure hypercholesterolemia 11/26/2020   Pelvic pain 03/26/2020   Fusion of spine of cervical region 03/26/2020   Other spondylosis with radiculopathy, cervical region 03/26/2020   Malignant carcinoid tumor of duodenum ([place]) 09/14/2019   Secondary hypercoagulable state ([place]) 08/28/2019   Lower GI bleed 08/17/2019   Iron deficiency anemia due to chronic blood loss 08/17/2019   Goals of care, counseling/discussion 08/17/2019   Essential hypertension 08/06/2019   CKD (chronic kidney disease), stage III ([place]) 08/06/2019   Calcified mesenteric mass 08/06/2019   Diarrhea    Acute respiratory failure with hypoxia (HCC)    Sepsis without acute organ dysfunction (HCC)    PAF (paroxysmal atrial fibrillation) ([place]) 07/30/2019   Colitis 07/27/2019   Type 2 macular telangiectasis, left 07/26/2019   Retinal hemorrhage of left eye 07/26/2019   Type 2 macular telangiectasis, right 07/26/2019   Vitreomacular adhesion of right eye 07/26/2019   Vitreomacular adhesion of left eye 07/26/2019   Loss of appetite 09/23/2016   AKI (acute kidney injury) ([facility]) 09/23/2016   Orthostasis 09/23/2016   Hyponatremia 09/23/2016   Leukocytosis 09/23/2016   Normocytic anemia 09/23/2016   Depression with anxiety 09/23/2016   Diabetes mellitus type II, non insulin dependent ([place]) 09/23/2016   Transaminasemia 09/23/2016   Dehydration    Elevated LFTs    Chronic midline low back pain without sciatica 01/13/2016   Past Medical History:  Diagnosis Date   Arthritis    all over   Cancer [facility])    breast   Depression    Diabetes mellitus without complication ([place])    diet controlled   Goals of care, counseling/discussion 08/17/2019   Hypertension    Iron deficiency anemia due to chronic blood loss 08/17/2019   Lower GI bleed  08/17/2019   Malignant carcinoid tumor of duodenum ([place]) 09/14/2019   PAF (paroxysmal atrial fibrillation) ([place]) 07/30/2019   Pure hypercholesterolemia 11/26/2020    Family History  Problem Relation Age of Onset   Hypertension Mother    Heart disease Mother    Hypertension Father    Heart disease Father     Past Surgical History:  Procedure Laterality Date   APPENDECTOMY     with lower back surgery   BACK SURGERY     x2   BIOPSY  08/02/2019   Procedure: BIOPSY;  Surgeon: Yetta Flock, MD;  Location:  [facility];  Service: Gastroenterology;;   BREAST SURGERY Left 1995   complete left mastectomy   CERVICAL SPINE SURGERY     COLONOSCOPY WITH PROPOFOL N/A 08/02/2019   Procedure: COLONOSCOPY WITH PROPOFOL;  Surgeon: Yetta Flock, MD;  Location: [facility];  Service: Gastroenterology;  Laterality: N/A;   EYE SURGERY Bilateral    cataracts   TONSILLECTOMY     at age 48   Social History   Occupational History   Not on file  Tobacco Use   Smoking status: Never   Smokeless tobacco: Never  Vaping Use   Vaping Use: Never used  Substance and Sexual Activity   Alcohol use: Yes    Alcohol/week: 1.0 standard drink    Types: 1 Glasses of wine per week    Comment: yearly   Drug use: Never   Sexual activity: Not on file

## 2021-02-12 ENCOUNTER — Encounter: Payer: Self-pay | Admitting: Family

## 2021-02-19 ENCOUNTER — Other Ambulatory Visit: Payer: Self-pay

## 2021-02-19 ENCOUNTER — Ambulatory Visit (HOSPITAL_BASED_OUTPATIENT_CLINIC_OR_DEPARTMENT_OTHER)
Admission: RE | Admit: 2021-02-19 | Discharge: 2021-02-19 | Disposition: A | Discharge status: Home / Self Care | Payer: Medicare Other | Source: Ambulatory Visit | Attending: Hematology & Oncology | Admitting: Hematology & Oncology

## 2021-02-19 ENCOUNTER — Encounter (HOSPITAL_BASED_OUTPATIENT_CLINIC_OR_DEPARTMENT_OTHER): Payer: Self-pay

## 2021-02-19 ENCOUNTER — Inpatient Hospital Stay: Payer: Medicare Other | Attending: Hematology & Oncology

## 2021-02-19 ENCOUNTER — Inpatient Hospital Stay (HOSPITAL_BASED_OUTPATIENT_CLINIC_OR_DEPARTMENT_OTHER): Payer: Medicare Other | Admitting: Hematology & Oncology

## 2021-02-19 ENCOUNTER — Encounter: Payer: Self-pay | Admitting: Hematology & Oncology

## 2021-02-19 VITALS — BP 151/60 | HR 59 | Temp 97.5°F | Resp 16 | Wt 111.0 lb

## 2021-02-19 DIAGNOSIS — C7A010 Malignant carcinoid tumor of the duodenum: Secondary | ICD-10-CM | POA: Diagnosis present

## 2021-02-19 DIAGNOSIS — R296 Repeated falls: Secondary | ICD-10-CM | POA: Diagnosis not present

## 2021-02-19 DIAGNOSIS — R1900 Intra-abdominal and pelvic swelling, mass and lump, unspecified site: Secondary | ICD-10-CM | POA: Diagnosis present

## 2021-02-19 DIAGNOSIS — D500 Iron deficiency anemia secondary to blood loss (chronic): Secondary | ICD-10-CM | POA: Insufficient documentation

## 2021-02-19 LAB — CMP (CANCER CENTER ONLY)
ALT: 13 U/L (ref 0–44)
AST: 14 U/L — ABNORMAL LOW (ref 15–41)
Albumin: 4.4 g/dL (ref 3.5–5.0)
Alkaline Phosphatase: 60 U/L (ref 38–126)
Anion gap: 9 (ref 5–15)
BUN: 39 mg/dL — ABNORMAL HIGH (ref 8–23)
CO2: 25 mmol/L (ref 22–32)
Calcium: 11.1 mg/dL — ABNORMAL HIGH (ref 8.9–10.3)
Chloride: 103 mmol/L (ref 98–111)
Creatinine: 1.49 mg/dL — ABNORMAL HIGH (ref 0.44–1.00)
GFR, Estimated: 34 mL/min — ABNORMAL LOW (ref >60)
Glucose, Bld: 156 mg/dL — ABNORMAL HIGH (ref 70–99)
Potassium: 4.8 mmol/L (ref 3.5–5.1)
Sodium: 137 mmol/L (ref 135–145)
Total Bilirubin: 0.6 mg/dL (ref 0.3–1.2)
Total Protein: 7 g/dL (ref 6.5–8.1)

## 2021-02-19 LAB — CBC WITH DIFFERENTIAL (CANCER CENTER ONLY)
Abs Immature Granulocytes: 0.08 10*3/uL — ABNORMAL HIGH (ref 0.00–0.07)
Basophils Absolute: 0.2 10*3/uL — ABNORMAL HIGH (ref 0.0–0.1)
Basophils Relative: 2 %
Eosinophils Absolute: 0.2 10*3/uL (ref 0.0–0.5)
Eosinophils Relative: 3 %
HCT: 39 % (ref 36.0–46.0)
Hemoglobin: 12.3 g/dL (ref 12.0–15.0)
Immature Granulocytes: 1 %
Lymphocytes Relative: 37 %
Lymphs Abs: 3 10*3/uL (ref 0.7–4.0)
MCH: 28.5 pg (ref 26.0–34.0)
MCHC: 31.5 g/dL (ref 30.0–36.0)
MCV: 90.3 fL (ref 80.0–100.0)
Monocytes Absolute: 0.7 10*3/uL (ref 0.1–1.0)
Monocytes Relative: 9 %
Neutro Abs: 3.9 10*3/uL (ref 1.7–7.7)
Neutrophils Relative %: 48 %
Platelet Count: 164 10*3/uL (ref 150–400)
RBC: 4.32 MIL/uL (ref 3.87–5.11)
RDW: 13.8 % (ref 11.5–15.5)
Smear Review: NORMAL
WBC Count: 8.1 10*3/uL (ref 4.0–10.5)
nRBC: 0 % (ref 0.0–0.2)

## 2021-02-19 LAB — RETICULOCYTES
Immature Retic Fract: 12.8 % (ref 2.3–15.9)
RBC.: 4.31 MIL/uL (ref 3.87–5.11)
Retic Count, Absolute: 84 10*3/uL (ref 19.0–186.0)
Retic Ct Pct: 2 % (ref 0.4–3.1)

## 2021-02-19 LAB — LACTATE DEHYDROGENASE: LDH: 168 U/L (ref 98–192)

## 2021-02-19 MED ORDER — IOHEXOL 300 MG/ML  SOLN
100.0000 mL | Freq: Once | INTRAMUSCULAR | Status: AC | PRN
  Administered 2021-02-19: 80 mL via INTRAVENOUS

## 2021-02-19 NOTE — Progress Notes (Signed)
Hematology and Oncology Follow Up Visit  Deanna [name] [id number] 11-01-1930 88 y.o. 02/19/2021   Principle Diagnosis:  Abdominal carcinoid Iron deficiency anemia-blood loss   Current Therapy:   IV iron-Feraheme given on 01/22/2020     Interim History:  Deanna [name] is back for follow-up.  We last saw her back in September.  Since then, she has been doing pretty well.  Unfortunate, she has been having episodes of falling.  She falls a couple times a day.  Thankfully, she has not broken anything yet.  She apparently is still driving a little bit.  I told her that I would not allow her to drive.  I think her family doctor also is probably going to not allow her to drive.  I told her daughter to take her car keys.  I think it is dangerous for her to be driving.  I think she would hurt herself or more importantly somebody else.  She did have a CT scan done today.  The CT scan did not show any change in this mesenteric mass.  It still was present.  It did not change in size.  It measured 4.4 cm.  There is no areas of new disease.  Her last Chromogranin A was elevated at 1574.  She is not having any diarrhea.  There is been no problems with her heart.  There is no evidence of congestive  heart failure.  There is been no problems with fever.  She has had no cough.  She has had no increased shortness of breath.  Her appetite has been pretty good.  Her weight is up 9 pounds since we last saw her.  Currently, her performance status is ECOG 3.   Medications:  Current Outpatient Medications:    acetaminophen (TYLENOL) 325 MG tablet, Take 650 mg by mouth 2 (two) times a day. , Disp: , Rfl:    amLODipine (NORVASC) 10 MG tablet, Take 1 tablet (10 mg total) by mouth daily., Disp: 90 tablet, Rfl: 3   budesonide (ENTOCORT EC) 3 MG 24 hr capsule, Take 9 mg by mouth every morning. (Patient not taking: Reported on 11/26/2020), Disp: , Rfl:    budesonide (ENTOCORT EC) 3 MG 24 hr capsule, Take by  mouth daily. Per patient taking 9 mg for 2 weeks, taking 6 mg for  2 weeks, 3mg  for 2 weeks., Disp: , Rfl:    buPROPion (WELLBUTRIN XL) 150 MG 24 hr tablet, Take 150 mg by mouth daily., Disp: , Rfl:    carvedilol (COREG) 25 MG tablet, Take 1 tablet (25 mg total) by mouth 2 (two) times daily with a meal., Disp: , Rfl:    Cyanocobalamin (VITAMIN B 12 PO), Take 1,000 mcg by mouth daily., Disp: , Rfl:    cyclobenzaprine (FLEXERIL) 5 MG tablet, Take 5 mg by mouth every 8 (eight) hours as needed for muscle spasms., Disp: , Rfl:    cycloSPORINE (RESTASIS) 0.05 % ophthalmic emulsion, Place 1 drop into both eyes 2 (two) times daily., Disp: , Rfl:    denosumab (PROLIA) 60 MG/ML SOLN injection, Inject 60 mg into the skin every 6 (six) months. Administer in upper arm, thigh, or abdomen, Disp: , Rfl:    diclofenac Sodium (VOLTAREN) 1 % GEL, Apply 2 g topically 4 (four) times daily as needed (painful joints)., Disp: , Rfl:    escitalopram (LEXAPRO) 20 MG tablet, Take 20 mg by mouth daily., Disp: , Rfl:    esomeprazole (NEXIUM) 40 MG capsule, Take 40 mg  by mouth daily at 12 noon., Disp: , Rfl:    feeding supplement, ENSURE ENLIVE, (ENSURE ENLIVE) LIQD, Take 237 mLs by mouth 3 (three) times daily between meals., Disp: 237 mL, Rfl: 12   ferrous sulfate 325 (65 FE) MG tablet, Take 1 tablet (325 mg total) by mouth daily with breakfast. (Patient not taking: Reported on 11/26/2020), Disp: , Rfl:    FREESTYLE LITE test strip, , Disp: , Rfl:    insulin glargine (LANTUS) 100 UNIT/ML injection, Inject 0.05 mLs (5 Units total) into the skin daily. (Patient taking differently: Inject 18 Units into the skin daily.), Disp: , Rfl:    Lancets (FREESTYLE) lancets, 1 each daily., Disp: , Rfl:    loperamide (IMODIUM A-D) 2 MG tablet, Take 2 mg by mouth 4 (four) times daily as needed for diarrhea or loose stools., Disp: , Rfl:    mesalamine (LIALDA) 1.2 g EC tablet, Take 2.4 g by mouth daily with breakfast. (Patient not taking:  Reported on 11/26/2020), Disp: , Rfl:    metFORMIN (GLUCOPHAGE) 500 MG tablet, Take 500 mg by mouth daily with breakfast. (Patient not taking: Reported on 11/26/2020), Disp: , Rfl:    NOVOLOG FLEXPEN 100 UNIT/ML FlexPen, Inject into the skin., Disp: , Rfl:    pregabalin (LYRICA) 75 MG capsule, Take 1 capsule (75 mg total) by mouth 2 (two) times daily., Disp: 10 capsule, Rfl: 0   Probiotic Product (PROBIOTIC-10 PO), Take 1 capsule by mouth daily., Disp: , Rfl:    rosuvastatin (CRESTOR) 10 MG tablet, Take 10 mg by mouth at bedtime. , Disp: , Rfl:    SURE COMFORT PEN NEEDLES 31G X 5 MM MISC, , Disp: , Rfl:    SYMAX-SR 0.375 MG 12 hr tablet, Take 0.375 mg by mouth every 12 (twelve) hours as needed for pain., Disp: , Rfl:    traMADol (ULTRAM) 50 MG tablet, Take 50 mg by mouth 3 (three) times daily as needed., Disp: , Rfl:    VITAMIN D, CHOLECALCIFEROL, PO, Take 400 Units by mouth daily. , Disp: , Rfl:    vitamin E 400 UNIT capsule, Take 400 Units by mouth 2 (two) times a day. , Disp: , Rfl:   Allergies:  Allergies  Allergen Reactions   Codeine Nausea Only    Past Medical History, Surgical history, Social history, and Family History were reviewed and updated.  Review of Systems: Review of Systems  Constitutional:  Positive for fatigue.  HENT:  Negative.    Eyes: Negative.   Respiratory:  Positive for shortness of breath.   Cardiovascular:  Positive for leg swelling.  Gastrointestinal:  Positive for abdominal pain.  Endocrine: Negative.   Genitourinary: Negative.    Musculoskeletal:  Positive for arthralgias and myalgias.  Skin: Negative.   Neurological:  Positive for dizziness.  Hematological: Negative.   Psychiatric/Behavioral: Negative.     Physical Exam:  weight is 111 lb (50.3 kg). Her oral temperature is 97.5 F (36.4 C) (abnormal). Her blood pressure is 151/60 (abnormal) and her pulse is 59 (abnormal). Her respiration is 16 and oxygen saturation is 95%.   Wt Readings from Last  3 Encounters:  02/19/21 111 lb (50.3 kg)  01/06/21 107 lb (48.5 kg)  11/26/20 107 lb 8 oz (48.8 kg)    Physical Exam Vitals reviewed.  HENT:     Head: Normocephalic and atraumatic.  Eyes:     Pupils: Pupils are equal, round, and reactive to light.  Cardiovascular:     Rate and Rhythm: Normal rate  and regular rhythm.     Heart sounds: Normal heart sounds.     Comments: Cardiac exam shows a regular rate and rhythm with occasional extra beat.  She has a 1/6 systolic ejection murmur. Pulmonary:     Effort: Pulmonary effort is normal.     Breath sounds: Normal breath sounds.  Abdominal:     General: Bowel sounds are normal.     Palpations: Abdomen is soft.     Comments: Her abdominal exam is soft.  Bowel sounds are present.  There is no obvious fluid wave.  There is no obvious abdominal mass.  Musculoskeletal:        General: No tenderness or deformity. Normal range of motion.     Cervical back: Normal range of motion.     Comments: Extremities shows some symmetric weakness in upper and lower extremities.  She has some arthritic changes in her joints.  She has decent pulses in her distal extremities.  She has some trace edema in her lower legs.  Lymphadenopathy:     Cervical: No cervical adenopathy.  Skin:    General: Skin is warm and dry.     Findings: No erythema or rash.  Neurological:     Mental Status: She is alert and oriented to person, place, and time.  Psychiatric:        Behavior: Behavior normal.        Thought Content: Thought content normal.        Judgment: Judgment normal.     Lab Results  Component Value Date   WBC 8.1 02/19/2021   HGB 12.3 02/19/2021   HCT 39.0 02/19/2021   MCV 90.3 02/19/2021   PLT 164 02/19/2021     Chemistry      Component Value Date/Time   NA 137 02/19/2021 1155   K 4.8 02/19/2021 1155   CL 103 02/19/2021 1155   CO2 25 02/19/2021 1155   BUN 39 (H) 02/19/2021 1155   CREATININE 1.49 (H) 02/19/2021 1155      Component Value  Date/Time   CALCIUM 11.1 (H) 02/19/2021 1155   ALKPHOS 60 02/19/2021 1155   AST 14 (L) 02/19/2021 1155   ALT 13 02/19/2021 1155   BILITOT 0.6 02/19/2021 1155       Impression and Plan: Ms. Hubble is a very charming 88 year old white female.  Looks like she has carcinoid.  This does not appear to be metastatic.  I would have to think that this is something that is going to be slow growing because of the calcifications.  Clearly, the CT scan shows that the biology of this carcinoid is not aggressive by any means.  At this point, I think we can do a scan in 6 months.  I think this would be very reasonable.  We will see if the Chromogranin A level is.  Again this is all about quality of life for her.  Again she is not to drive.  I told her that.  Again I told her daughter to take her car keys.  We will get the CT scan the same time that we see her back in 6 months.    Volanda Napoleon, MD 1/12/20232:16 PM

## 2021-02-20 ENCOUNTER — Ambulatory Visit (HOSPITAL_BASED_OUTPATIENT_CLINIC_OR_DEPARTMENT_OTHER): Payer: Medicare Other | Admitting: Cardiovascular Disease

## 2021-02-20 VITALS — BP 142/62 | HR 55 | Ht 62.0 in | Wt 110.3 lb

## 2021-02-20 DIAGNOSIS — I10 Essential (primary) hypertension: Secondary | ICD-10-CM | POA: Diagnosis not present

## 2021-02-20 DIAGNOSIS — Z5181 Encounter for therapeutic drug level monitoring: Secondary | ICD-10-CM | POA: Diagnosis not present

## 2021-02-20 DIAGNOSIS — E7800 Pure hypercholesterolemia, unspecified: Secondary | ICD-10-CM | POA: Diagnosis not present

## 2021-02-20 LAB — IRON AND IRON BINDING CAPACITY (CC-WL,HP ONLY)
Iron: 54 ug/dL (ref 28–170)
Saturation Ratios: 15 % (ref 10.4–31.8)
TIBC: 368 ug/dL (ref 250–450)
UIBC: 314 ug/dL (ref 148–442)

## 2021-02-20 LAB — FERRITIN: Ferritin: 119 ng/mL (ref 11–307)

## 2021-02-20 MED ORDER — LOSARTAN POTASSIUM 25 MG PO TABS
25.0000 mg | ORAL_TABLET | Freq: Every day | ORAL | 3 refills | Status: DC
Start: 2021-02-20 — End: 2022-05-13

## 2021-02-20 NOTE — Patient Instructions (Signed)
Medication Instructions:  START LOSARTAN 25 MG DAILY   *If you need a refill on your cardiac medications before your next appointment, please call your pharmacy*   Lab Work: FASTING LP/CMET [place]   If you have labs (blood work) drawn today and your tests are completely normal, you will receive your results only by: [place] (if you have MyChart) OR A paper copy in the mail If you have any lab test that is abnormal or we need to change your treatment, we will call you to review the results.   Testing/Procedures: NONE   Follow-Up: At [facility], you and your health needs are our priority.  As part of our continuing mission to provide you with exceptional heart care, we have created designated Provider Care Teams.  These Care Teams include your primary Cardiologist (physician) and Advanced Practice Providers (APPs -  Physician Assistants and Nurse Practitioners) who all work together to provide you with the care you need, when you need it.  We recommend signing up for the patient portal called "MyChart".  Sign up information is provided on this After Visit Summary.  MyChart is used to connect with patients for Virtual Visits (Telemedicine).  Patients are able to view lab/test results, encounter notes, upcoming appointments, etc.  Non-urgent messages can be sent to your provider as well.   To learn more about what you can do with MyChart, go to [email or web address].    Your next appointment:   6 month(s)  The format for your next appointment:   In Person  Provider:   [name], MD

## 2021-02-20 NOTE — Progress Notes (Signed)
.    Cardiology Office Note   Date:  02/24/2021   ID:  Deanna [name], DOB [birth year], MRN [id number]  PCP:  Ginger Organ., MD  Cardiologist:   [name], MD   No chief complaint on file.    History of Present Illness: Deanna [name] is a 88 y.o. female with paroxysmal atrial fibrillation, chronic left bundle branch block, diabetes, carcinoid, and pulmonary hypertension who presents for follow-up.  She was first diagnosed with atrial fibrillation 07/2019 in the setting of sepsis and colitis.  Her heart rates were controlled and she was started on Eliquis.  Echo that admission revealed LVEF 60 to 65% with moderate LVH and indeterminate diastolic function.  However she developed GI bleeding requiring transfusions her Eliquis was discontinued.  She was subsequently hospitalized at [facility] 08/2019 with a transfusion reaction.  She followed up with atrial fibrillation clinic 08/2019 and was maintaining sinus rhythm.  She has also struggled with symptoms of carcinoid.  She was found to have a mesenteric mass and elevated Chromogranin A levels.  She follows with Dr. Marin Olp for this.  She was treated with a round of steroids and her diarrhea has improved.  She continued to have occasional flushing.  She was not started on anticoagulation due to GI bleed and carcinoid tumor.  At her last appointment she had been struggling with falls.  Her blood pressure was controlled but was labile.  She did struggle with intravascular limb depletion.  We stopped HCTZ and amlodipine was increased.  She continues to check her blood pressures daily.  Overall they have been elevated.  It is ranging from the 120s to the 170s over 60s to 90s.  She has not had any syncope or lightheadedness.  She continues to have some falls that occur when she forgets to use her walker or with transitions.  She has no orthostatic symptoms.  She has not been getting as much exercise lately but plans to start PT soon.  She  struggles with pain in her hip and back.  She has no chest pain, shortness of breath, lower extremity edema, orthopnea, or PND.  She is continues to struggle with loss of independence and the inability to drive.   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[place]) 09/14/2019   PAF (paroxysmal atrial fibrillation) ([place]) 07/30/2019   Pure hypercholesterolemia 11/26/2020    Past Surgical History:  Procedure Laterality Date   APPENDECTOMY     with lower back surgery   BACK SURGERY     x2   BIOPSY  08/02/2019   Procedure: BIOPSY;  Surgeon: Yetta Flock, MD;  Location: [facility] ENDOSCOPY;  Service: Gastroenterology;;   BREAST SURGERY Left 1995   complete left mastectomy   CERVICAL SPINE SURGERY     COLONOSCOPY WITH PROPOFOL N/A 08/02/2019   Procedure: COLONOSCOPY WITH PROPOFOL;  Surgeon: Yetta Flock, MD;  Location: [facility]OSCOPY;  Service: Gastroenterology;  Laterality: N/A;   EYE SURGERY Bilateral    cataracts   TONSILLECTOMY     at age 44     Current Outpatient Medications  Medication Sig  Dispense Refill   acetaminophen (TYLENOL) 325 MG tablet Take 650 mg by mouth 2 (two) times a day.      amLODipine (NORVASC) 10 MG tablet Take 1 tablet (10 mg total) by mouth daily. 90 tablet 3   buPROPion (WELLBUTRIN XL) 150 MG 24 hr tablet Take 150 mg by mouth daily.     carvedilol (COREG) 25 MG tablet Take 1 tablet (25 mg total) by mouth 2 (two) times daily with a meal.     Cyanocobalamin (VITAMIN B 12 PO) Take 1,000 mcg by mouth daily.     cyclobenzaprine (FLEXERIL) 5 MG tablet Take 5 mg by mouth every 8 (eight) hours as needed for muscle spasms.     cycloSPORINE (RESTASIS)  0.05 % ophthalmic emulsion Place 1 drop into both eyes 2 (two) times daily.     denosumab (PROLIA) 60 MG/ML SOLN injection Inject 60 mg into the skin every 6 (six) months. Administer in upper arm, thigh, or abdomen     diclofenac Sodium (VOLTAREN) 1 % GEL Apply 2 g topically 4 (four) times daily as needed (painful joints).     escitalopram (LEXAPRO) 20 MG tablet Take 20 mg by mouth daily.     esomeprazole (NEXIUM) 40 MG capsule Take 40 mg by mouth 2 (two) times daily before a meal.     feeding supplement, ENSURE ENLIVE, (ENSURE ENLIVE) LIQD Take 237 mLs by mouth 3 (three) times daily between meals. 237 mL 12   FREESTYLE LITE test strip      insulin glargine (LANTUS) 100 UNIT/ML injection Inject 0.05 mLs (5 Units total) into the skin daily. (Patient taking differently: Inject 18 Units into the skin daily.)     Lancets (FREESTYLE) lancets 1 each daily.     loperamide (IMODIUM A-D) 2 MG tablet Take 2 mg by mouth 4 (four) times daily as needed for diarrhea or loose stools.     losartan (COZAAR) 25 MG tablet Take 1 tablet (25 mg total) by mouth daily. 90 tablet 3   NOVOLOG FLEXPEN 100 UNIT/ML FlexPen Inject into the skin.     pregabalin (LYRICA) 75 MG capsule Take 1 capsule (75 mg total) by mouth 2 (two) times daily. 10 capsule 0   Probiotic Product (PROBIOTIC-10 PO) Take 1 capsule by mouth daily.     rosuvastatin (CRESTOR) 10 MG tablet Take 10 mg by mouth at bedtime.      SURE COMFORT PEN NEEDLES 31G X 5 MM MISC      SYMAX-SR 0.375 MG 12 hr tablet Take 0.375 mg by mouth every 12 (twelve) hours as needed for pain.     traMADol (ULTRAM) 50 MG tablet Take 50 mg by mouth 3 (three) times daily as needed.     VITAMIN D, CHOLECALCIFEROL, PO Take 400 Units by mouth daily.      vitamin E 400 UNIT capsule Take 400 Units by mouth 2 (two) times a day.      mesalamine (LIALDA) 1.2 g EC tablet Take 2.4 g by mouth daily with breakfast. (Patient not taking: Reported on 11/26/2020)     No current  facility-administered medications for this visit.    Allergies:   Codeine    Social History:  The patient  reports that she has never smoked. She has never used smokeless tobacco. She reports current alcohol use of about 1.0 standard drink per week. She reports that she does not use drugs.   Family History:  The patient's family history includes Heart disease in her father and  mother; Hypertension in her father and mother.    ROS:  Please see the history of present illness.    (+) Chest discomfort (+) Leg Pain (Posterior R Leg) (+) Falls All other systems are reviewed and negative.    PHYSICAL EXAM: VS:  BP (!) 142/62 (BP Location: Right Arm, Patient Position: Sitting, Cuff Size: Normal)    Pulse (!) 55    Ht 5\' 2"  (1.575 m)    Wt 110 lb 4.8 oz (50 kg)    SpO2 95%    BMI 20.17 kg/m  , BMI Body mass index is 20.17 kg/m. GENERAL:  Well appearing.  Frail. HEENT: Pupils equal round and reactive, fundi not visualized, oral mucosa unremarkable NECK:  No jugular venous distention, waveform within normal limits, carotid upstroke brisk and symmetric, no bruits, no thyromegaly LUNGS:  Clear to auscultation bilaterally HEART:  RRR.  PMI not displaced or sustained,S1 and S2 within normal limits, no S3, no S4, no clicks, no rubs, no murmurs ABD:  Flat, positive bowel sounds normal in frequency in pitch, no bruits, no rebound, no guarding, no midline pulsatile mass, no hepatomegaly, no splenomegaly EXT:  2 plus pulses throughout, no edema, no cyanosis no clubbing SKIN:  No rashes no nodules NEURO:  Cranial nerves II through XII grossly intact, motor grossly intact throughout PSYCH:  Cognitively intact, oriented to person place and time  EKG:   11/26/2020: Sinus bradycardia Rate 51 bpm Prior anteroseptal infarct 3/22: Sinus rhythm.  Rate 64 bpm.  IVCD.  Prior anterior infarct. 11/29/2019:  sinus rhythm.  Rate 66 bpm.  LBBB.  R axis deviation  Echo 07/31/19   1. Left ventricular ejection  fraction, by estimation, is 60 to 65%. The  left ventricle has normal function. The left ventricle has no regional  wall motion abnormalities. There is moderate concentric left ventricular  hypertrophy. Left ventricular  diastolic parameters are indeterminate. Elevated left ventricular  end-diastolic pressure.   2. Right ventricular systolic function is normal. The right ventricular  size is normal. There is mildly elevated pulmonary artery systolic  pressure. The estimated right ventricular systolic pressure is 38.2 mmHg.   3. The mitral valve is normal in structure. Mild mitral valve  regurgitation. No evidence of mitral stenosis.   4. Tricuspid valve regurgitation is moderate.   5. The aortic valve is tricuspid. Aortic valve regurgitation is not  visualized. Mild to moderate aortic valve sclerosis/calcification is  present, without any evidence of aortic stenosis.   6. The inferior vena cava is normal in size with greater than 50%  respiratory variability, suggesting right atrial pressure of 3 mmHg.    Recent Labs: 02/19/2021: ALT 13; BUN 39; Creatinine 1.49; Hemoglobin 12.3; Platelet Count 164; Potassium 4.8; Sodium 137    Lipid Panel No results found for: CHOL, TRIG, HDL, CHOLHDL, VLDL, LDLCALC, LDLDIRECT    Wt Readings from Last 3 Encounters:  02/20/21 110 lb 4.8 oz (50 kg)  02/19/21 111 lb (50.3 kg)  01/06/21 107 lb (48.5 kg)      ASSESSMENT AND PLAN: No problem-specific Assessment & Plan notes found for this encounter.  PAF (paroxysmal atrial fibrillation) ([place]) She is maintaining sinus rhythm.  She had atrial fibrillation in the setting of sepsis.  She has not been on anticoagulation due to GI bleed and carcinoid tumor.  Continue carvedilol.   Essential hypertension BP  has been labile.  She is also struggled with dehydration and HCTZ was discontinued.  Given her multiple falls, BP goal was increased  to <140/90, though lately her falls have been more mechanical than  orthostatic.  Lately her BP has been higher and often in the 170s.  We will start losartan 25mg  and check BMP in a week.   Pure hypercholesterolemia Continue rosuvastatin.  Current medicines are reviewed at length with the patient today.  The patient does not have concerns regarding medicines.  The following changes have been made: Switch diltiazem to amlodipine  Labs/ tests ordered today include:   Orders Placed This Encounter  Procedures   Lipid panel   Comprehensive metabolic panel     Disposition:   FU with Daionna Crossland C. Oval Linsey, MD, [facility] in 6 months      Signed, Vernia Teem C. Oval Linsey, MD, [facility]  02/24/2021 3:28 PM    [facility]

## 2021-02-24 ENCOUNTER — Encounter (HOSPITAL_BASED_OUTPATIENT_CLINIC_OR_DEPARTMENT_OTHER): Payer: Self-pay | Admitting: Cardiovascular Disease

## 2021-02-25 LAB — CHROMOGRANIN A: Chromogranin A (ng/mL): 709.5 ng/mL — ABNORMAL HIGH (ref 0.0–101.8)

## 2021-02-27 ENCOUNTER — Encounter: Payer: Self-pay | Admitting: Family

## 2021-03-23 ENCOUNTER — Encounter (HOSPITAL_BASED_OUTPATIENT_CLINIC_OR_DEPARTMENT_OTHER): Payer: Self-pay

## 2021-04-14 ENCOUNTER — Encounter: Payer: Self-pay | Admitting: Family

## 2021-05-19 ENCOUNTER — Other Ambulatory Visit (HOSPITAL_COMMUNITY): Payer: Self-pay | Admitting: *Deleted

## 2021-05-20 ENCOUNTER — Ambulatory Visit (HOSPITAL_COMMUNITY)
Admission: RE | Admit: 2021-05-20 | Discharge: 2021-05-20 | Disposition: A | Discharge status: Home / Self Care | Payer: Medicare Other | Source: Ambulatory Visit | Attending: Internal Medicine | Admitting: Internal Medicine

## 2021-05-20 DIAGNOSIS — M810 Age-related osteoporosis without current pathological fracture: Secondary | ICD-10-CM | POA: Diagnosis not present

## 2021-05-20 MED ORDER — DENOSUMAB 60 MG/ML ~~LOC~~ SOSY
PREFILLED_SYRINGE | SUBCUTANEOUS | Status: AC
  Filled 2021-05-20: qty 1

## 2021-05-20 MED ORDER — DENOSUMAB 60 MG/ML ~~LOC~~ SOSY
60.0000 mg | PREFILLED_SYRINGE | Freq: Once | SUBCUTANEOUS | Status: AC
  Administered 2021-05-20: 60 mg via SUBCUTANEOUS

## 2021-06-26 IMAGING — CT CT ABD-PELV W/O CM
2 of 4 series · 16 of 46 positions shown, 18 images · non-contrast
Comparison: CT abdomen pelvis, 07/27/2019 MRA abdomen pelvis,
11/13/2019, PET-CT, 09/04/2019

CLINICAL DATA: Evaluate mesenteric mass

EXAM:
CT ABDOMEN AND PELVIS WITHOUT CONTRAST
TECHNIQUE: Multidetector CT imaging of the abdomen and pelvis was performed
following the standard protocol without IV contrast. Oral enteric
contrast was administered.

[Series 2: axial st · axial · 0.69mm/px · z∈[-416,-32]mm · 13 of 85 slices shown, 15 images]
[im 4/85  soft-tissue]
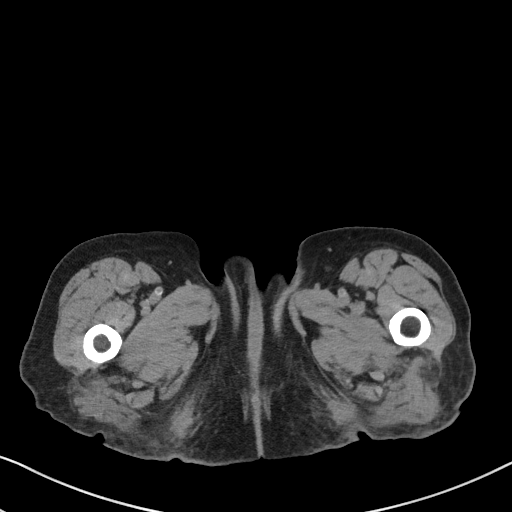
[im 4/85  bone]
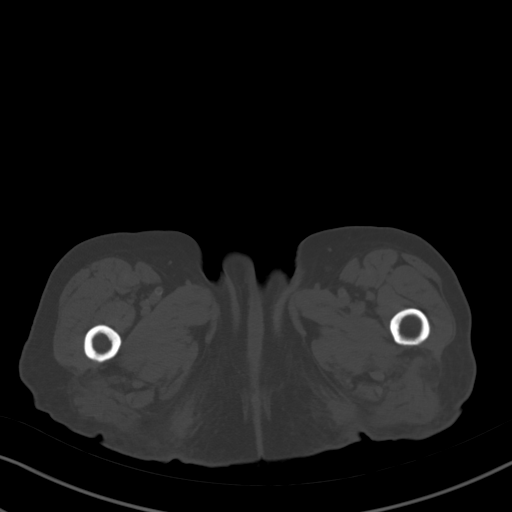
[im 11/85  soft-tissue]
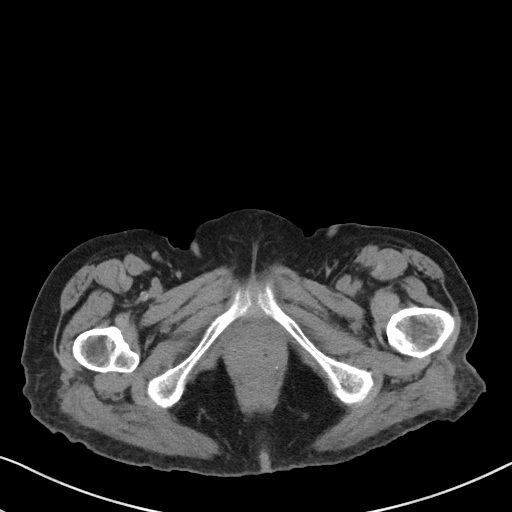
[im 17/85  soft-tissue]
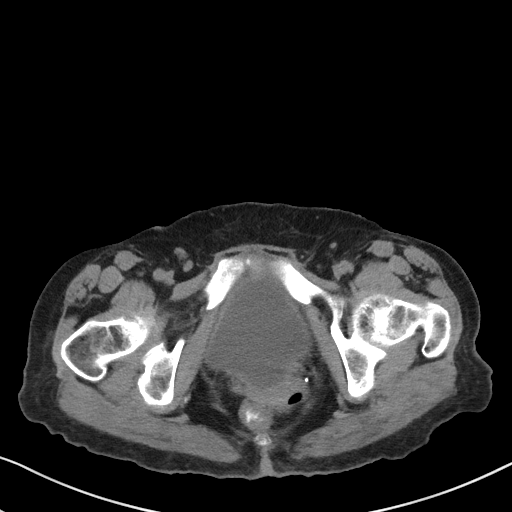
[im 24/85  soft-tissue]
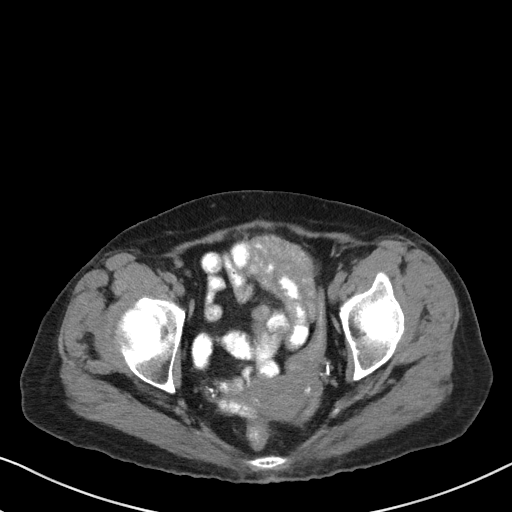
[im 31/85  soft-tissue]
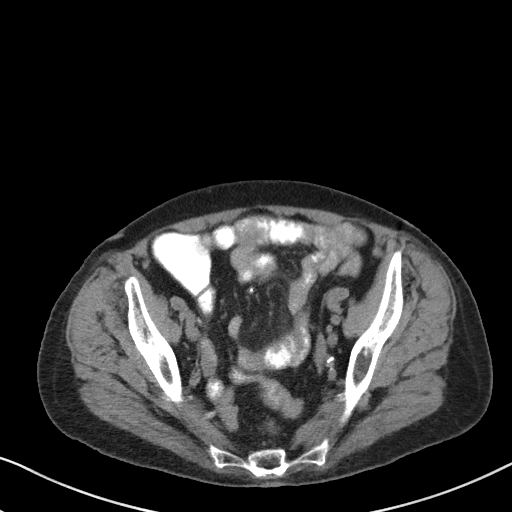
[im 37/85  soft-tissue]
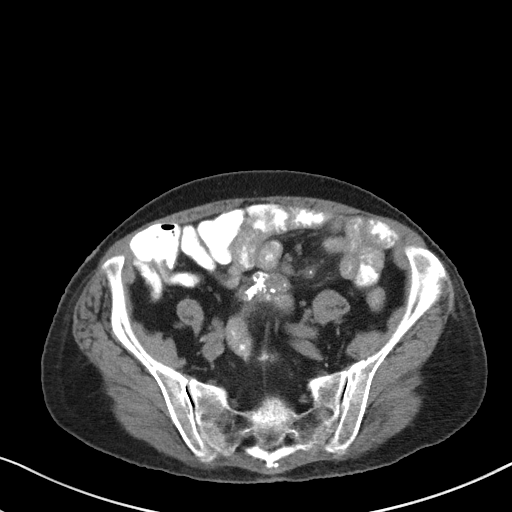
[im 44/85  soft-tissue]
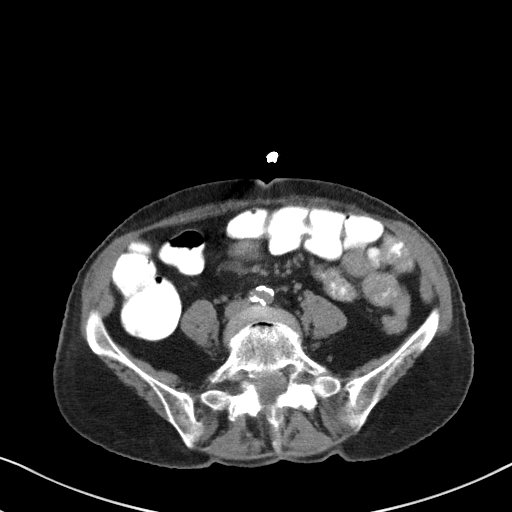
[im 48/85  soft-tissue]
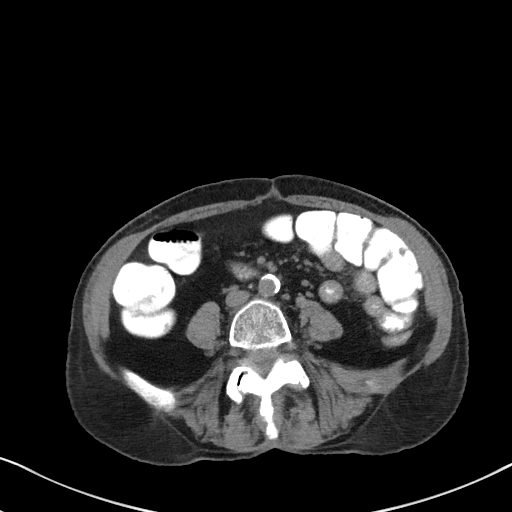
[im 54/85  soft-tissue]
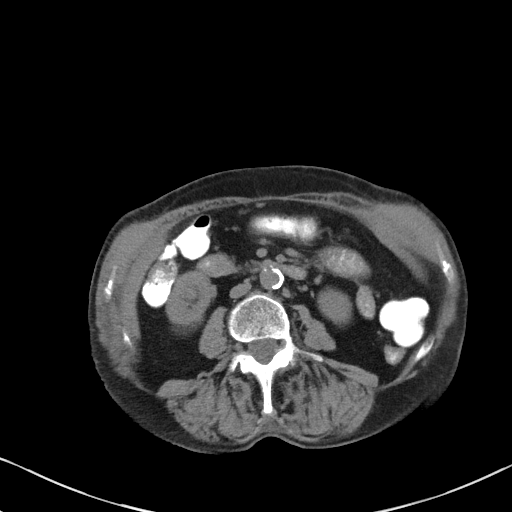
[im 54/85  bone]
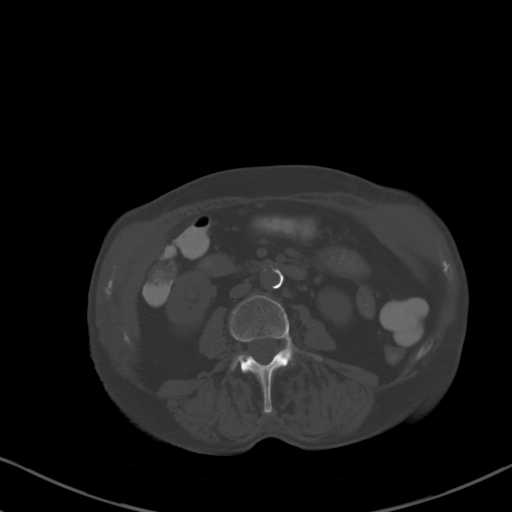
[im 61/85  soft-tissue]
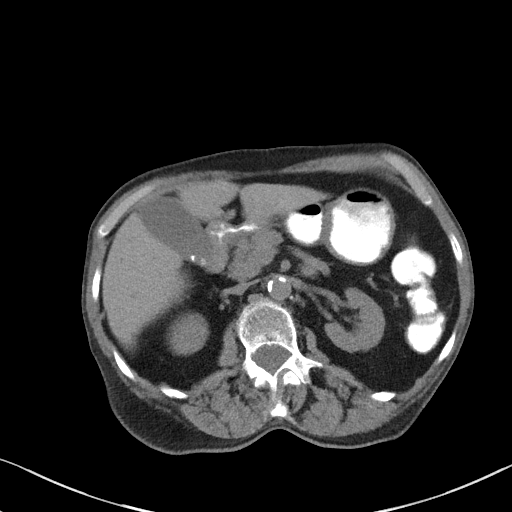
[im 68/85  soft-tissue]
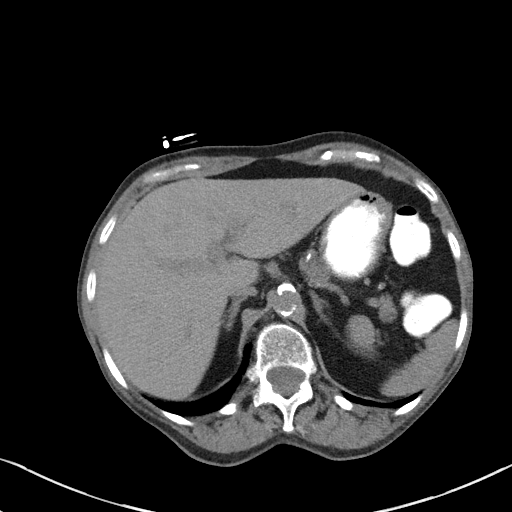
[im 74/85  soft-tissue]
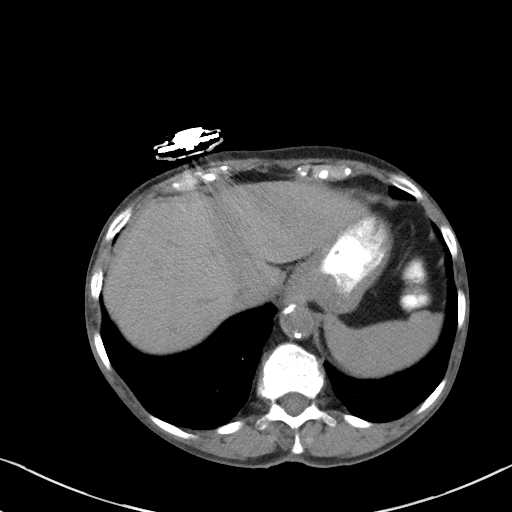
[im 81/85  soft-tissue]
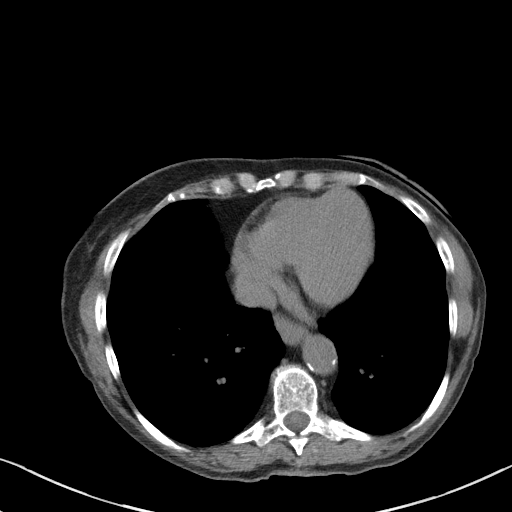

[Series 4: coronal st · coronal · 0.68mm/px · 3 of 77 slices shown]
[im 26/77  soft-tissue]
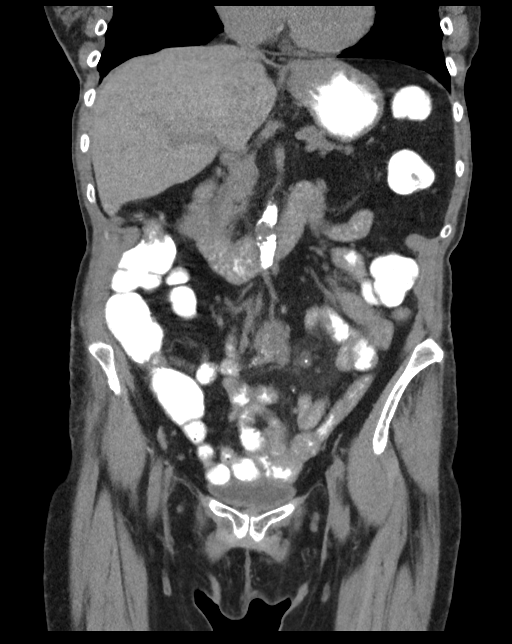
[im 34/77  soft-tissue]
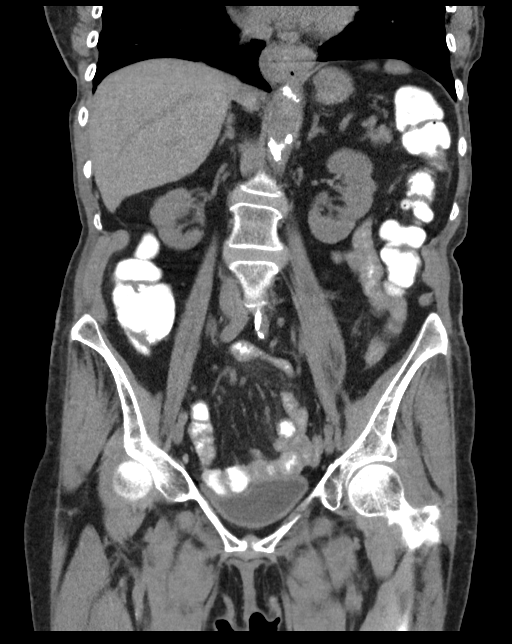
[im 43/77  soft-tissue]
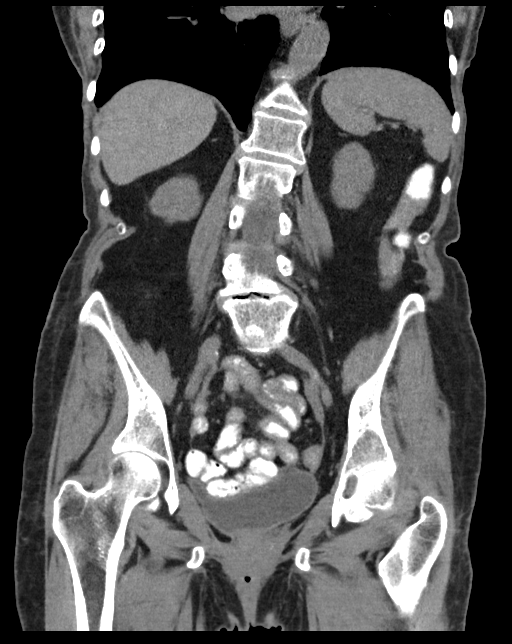

[16 of 46 positions shown; findings below may reference images not displayed]

FINDINGS: Lower chest: No acute abnormality.  Small hiatal hernia.

Hepatobiliary: No solid liver abnormality is seen. Small gallstones
in the gallbladder. Gallbladder wall thickening, or biliary
dilatation.

Pancreas: Unremarkable. No pancreatic ductal dilatation or
surrounding inflammatory changes.

Spleen: Normal in size without significant abnormality.

Adrenals/Urinary Tract: Adrenal glands are unremarkable. Kidneys are
normal, without renal calculi, solid lesion, or hydronephrosis.
Bladder is unremarkable.

Stomach/Bowel: Stomach is within normal limits. No evidence of bowel
wall thickening, distention, or inflammatory changes. There is a
redemonstrated, partially calcified soft tissue mass of the central
small bowel mesentery with associated tethering and retraction,
which is not significantly changed compared to prior examination,
due to orientation best appreciated in the coronal plane and
measuring approximately 3.3 x 2.8 cm (series 4, image 27).

Vascular/Lymphatic: Aortic atherosclerosis. No enlarged abdominal or
pelvic lymph nodes.

Reproductive: No mass or other significant abnormality.

Other: No abdominal wall hernia or abnormality. No abdominopelvic
ascites.

Musculoskeletal: No acute or significant osseous findings.
IMPRESSION: 1. There is a redemonstrated, partially calcified soft tissue mass
of the central small bowel mesentery with associated tethering and
retraction, which is not significantly changed compared to prior
examination, due to orientation best appreciated in the coronal
plane and measuring approximately 3.3 x 2.8 cm. Findings are most
consistent with small bowel carcinoid metastasis. Primary lesion is
not appreciated.
2. No noncontrast CT evidence of metastatic disease elsewhere in the
abdomen.
3. Cholelithiasis.
4. Aortic Atherosclerosis ([id number]-X2O.O).

## 2021-08-05 ENCOUNTER — Ambulatory Visit (HOSPITAL_BASED_OUTPATIENT_CLINIC_OR_DEPARTMENT_OTHER)
Admission: RE | Admit: 2021-08-05 | Discharge: 2021-08-05 | Disposition: A | Discharge status: Home / Self Care | Payer: Medicare Other | Source: Ambulatory Visit | Attending: Hematology & Oncology | Admitting: Hematology & Oncology

## 2021-08-05 ENCOUNTER — Inpatient Hospital Stay (HOSPITAL_BASED_OUTPATIENT_CLINIC_OR_DEPARTMENT_OTHER): Payer: Medicare Other | Admitting: Hematology & Oncology

## 2021-08-05 ENCOUNTER — Other Ambulatory Visit: Payer: Self-pay

## 2021-08-05 ENCOUNTER — Encounter: Payer: Self-pay | Admitting: Hematology & Oncology

## 2021-08-05 ENCOUNTER — Encounter (HOSPITAL_BASED_OUTPATIENT_CLINIC_OR_DEPARTMENT_OTHER): Payer: Self-pay

## 2021-08-05 ENCOUNTER — Inpatient Hospital Stay: Payer: Medicare Other | Attending: Hematology & Oncology

## 2021-08-05 VITALS — BP 155/46 | HR 64 | Temp 97.5°F | Resp 16 | Wt 103.0 lb

## 2021-08-05 DIAGNOSIS — Z79899 Other long term (current) drug therapy: Secondary | ICD-10-CM | POA: Diagnosis not present

## 2021-08-05 DIAGNOSIS — D500 Iron deficiency anemia secondary to blood loss (chronic): Secondary | ICD-10-CM | POA: Insufficient documentation

## 2021-08-05 DIAGNOSIS — C7A010 Malignant carcinoid tumor of the duodenum: Secondary | ICD-10-CM | POA: Insufficient documentation

## 2021-08-05 DIAGNOSIS — R1900 Intra-abdominal and pelvic swelling, mass and lump, unspecified site: Secondary | ICD-10-CM | POA: Diagnosis present

## 2021-08-05 LAB — CMP (CANCER CENTER ONLY)
ALT: 10 U/L (ref 0–44)
AST: 13 U/L — ABNORMAL LOW (ref 15–41)
Albumin: 4.7 g/dL (ref 3.5–5.0)
Alkaline Phosphatase: 44 U/L (ref 38–126)
Anion gap: 9 (ref 5–15)
BUN: 23 mg/dL (ref 8–23)
CO2: 26 mmol/L (ref 22–32)
Calcium: 10.7 mg/dL — ABNORMAL HIGH (ref 8.9–10.3)
Chloride: 104 mmol/L (ref 98–111)
Creatinine: 1.1 mg/dL — ABNORMAL HIGH (ref 0.44–1.00)
GFR, Estimated: 48 mL/min — ABNORMAL LOW (ref >60)
Glucose, Bld: 176 mg/dL — ABNORMAL HIGH (ref 70–99)
Potassium: 4.6 mmol/L (ref 3.5–5.1)
Sodium: 139 mmol/L (ref 135–145)
Total Bilirubin: 0.6 mg/dL (ref 0.3–1.2)
Total Protein: 8 g/dL (ref 6.5–8.1)

## 2021-08-05 LAB — CBC WITH DIFFERENTIAL (CANCER CENTER ONLY)
Abs Immature Granulocytes: 0.03 10*3/uL (ref 0.00–0.07)
Basophils Absolute: 0.2 10*3/uL — ABNORMAL HIGH (ref 0.0–0.1)
Basophils Relative: 2 %
Eosinophils Absolute: 0.4 10*3/uL (ref 0.0–0.5)
Eosinophils Relative: 4 %
HCT: 42 % (ref 36.0–46.0)
Hemoglobin: 13.3 g/dL (ref 12.0–15.0)
Immature Granulocytes: 0 %
Lymphocytes Relative: 40 %
Lymphs Abs: 3.9 10*3/uL (ref 0.7–4.0)
MCH: 28.4 pg (ref 26.0–34.0)
MCHC: 31.7 g/dL (ref 30.0–36.0)
MCV: 89.6 fL (ref 80.0–100.0)
Monocytes Absolute: 0.6 10*3/uL (ref 0.1–1.0)
Monocytes Relative: 7 %
Neutro Abs: 4.6 10*3/uL (ref 1.7–7.7)
Neutrophils Relative %: 47 %
Platelet Count: 215 10*3/uL (ref 150–400)
RBC: 4.69 MIL/uL (ref 3.87–5.11)
RDW: 14.6 % (ref 11.5–15.5)
WBC Count: 9.6 10*3/uL (ref 4.0–10.5)
nRBC: 0 % (ref 0.0–0.2)

## 2021-08-05 MED ORDER — IOHEXOL 300 MG/ML  SOLN
100.0000 mL | Freq: Once | INTRAMUSCULAR | Status: AC | PRN
  Administered 2021-08-05: 100 mL via INTRAVENOUS

## 2021-08-05 NOTE — Progress Notes (Signed)
Hematology and Oncology Follow Up Visit  Deanna [name] [id number] 09-14-1930 88 y.o. 08/05/2021   Principle Diagnosis:  Abdominal carcinoid Iron deficiency anemia-blood loss   Current Therapy:   IV iron-Feraheme given on 04/15/2020     Interim History:  Ms. Speelman is back for follow-up.  She is doing pretty well.  We last saw her in January.  Since then, she been doing fairly well.  She uses a rolling walker.  She comes in with her daughter.  Since we last saw her, we did do a CT scan.  This was done of the abdomen initially.  Unfortunately, the tumor cannot be fully characterized on just the abdominal CT scan.  She is going to have a pelvic CT scan to get a better look at the tumor to see if there is any growth.  Her last Chromogranin A level back in January was actually much better at 700.  She has had no abdominal pain.  She has had no cough or shortness of breath.  She has had no obvious change in bowel or bladder habits.  She and her daughter were up in [place] for a great grandson's wedding.  This was certainly a wonderful event that she enjoyed it.  She has had no problems with pain.  She has had no leg swelling.  She has had no rashes.  Her last iron studies done back in January showed a ferritin of 119 with an iron saturation of 15%.  Currently, her performance status is probably ECOG 2.  Medications:  Current Outpatient Medications:    acetaminophen (TYLENOL) 325 MG tablet, Take 650 mg by mouth 2 (two) times a day. , Disp: , Rfl:    amLODipine (NORVASC) 10 MG tablet, Take 1 tablet (10 mg total) by mouth daily., Disp: 90 tablet, Rfl: 3   buPROPion (WELLBUTRIN XL) 150 MG 24 hr tablet, Take 150 mg by mouth daily., Disp: , Rfl:    carvedilol (COREG) 25 MG tablet, Take 1 tablet (25 mg total) by mouth 2 (two) times daily with a meal., Disp: , Rfl:    Cyanocobalamin (VITAMIN B 12 PO), Take 1,000 mcg by mouth daily., Disp: , Rfl:    cyclobenzaprine (FLEXERIL) 5 MG  tablet, Take 5 mg by mouth every 8 (eight) hours as needed for muscle spasms., Disp: , Rfl:    cycloSPORINE (RESTASIS) 0.05 % ophthalmic emulsion, Place 1 drop into both eyes 2 (two) times daily., Disp: , Rfl:    denosumab (PROLIA) 60 MG/ML SOLN injection, Inject 60 mg into the skin every 6 (six) months. Administer in upper arm, thigh, or abdomen, Disp: , Rfl:    diclofenac Sodium (VOLTAREN) 1 % GEL, Apply 2 g topically 4 (four) times daily as needed (painful joints)., Disp: , Rfl:    escitalopram (LEXAPRO) 20 MG tablet, Take 20 mg by mouth daily., Disp: , Rfl:    esomeprazole (NEXIUM) 40 MG capsule, Take 40 mg by mouth 2 (two) times daily before a meal., Disp: , Rfl:    feeding supplement, ENSURE ENLIVE, (ENSURE ENLIVE) LIQD, Take 237 mLs by mouth 3 (three) times daily between meals., Disp: 237 mL, Rfl: 12   FREESTYLE LITE test strip, , Disp: , Rfl:    insulin glargine (LANTUS) 100 UNIT/ML injection, Inject 0.05 mLs (5 Units total) into the skin daily. (Patient taking differently: Inject 18 Units into the skin daily.), Disp: , Rfl:    Lancets (FREESTYLE) lancets, 1 each daily., Disp: , Rfl:    loperamide (  IMODIUM A-D) 2 MG tablet, Take 2 mg by mouth 4 (four) times daily as needed for diarrhea or loose stools., Disp: , Rfl:    losartan (COZAAR) 25 MG tablet, Take 1 tablet (25 mg total) by mouth daily., Disp: 90 tablet, Rfl: 3   mesalamine (LIALDA) 1.2 g EC tablet, Take 2.4 g by mouth daily with breakfast. (Patient not taking: Reported on 11/26/2020), Disp: , Rfl:    NOVOLOG FLEXPEN 100 UNIT/ML FlexPen, Inject into the skin., Disp: , Rfl:    pregabalin (LYRICA) 75 MG capsule, Take 1 capsule (75 mg total) by mouth 2 (two) times daily., Disp: 10 capsule, Rfl: 0   Probiotic Product (PROBIOTIC-10 PO), Take 1 capsule by mouth daily., Disp: , Rfl:    rosuvastatin (CRESTOR) 10 MG tablet, Take 10 mg by mouth at bedtime. , Disp: , Rfl:    SURE COMFORT PEN NEEDLES 31G X 5 MM MISC, , Disp: , Rfl:    SYMAX-SR  0.375 MG 12 hr tablet, Take 0.375 mg by mouth every 12 (twelve) hours as needed for pain., Disp: , Rfl:    traMADol (ULTRAM) 50 MG tablet, Take 50 mg by mouth 3 (three) times daily as needed., Disp: , Rfl:    VITAMIN D, CHOLECALCIFEROL, PO, Take 400 Units by mouth daily. , Disp: , Rfl:    vitamin E 400 UNIT capsule, Take 400 Units by mouth 2 (two) times a day. , Disp: , Rfl:   Allergies:  Allergies  Allergen Reactions   Codeine Nausea Only    Past Medical History, Surgical history, Social history, and Family History were reviewed and updated.  Review of Systems: Review of Systems  Constitutional:  Positive for fatigue.  HENT:  Negative.    Eyes: Negative.   Respiratory:  Positive for shortness of breath.   Cardiovascular:  Positive for leg swelling.  Gastrointestinal:  Positive for abdominal pain.  Endocrine: Negative.   Genitourinary: Negative.    Musculoskeletal:  Positive for arthralgias and myalgias.  Skin: Negative.   Neurological:  Positive for dizziness.  Hematological: Negative.   Psychiatric/Behavioral: Negative.      Physical Exam:  weight is 103 lb (46.7 kg). Her oral temperature is 97.5 F (36.4 C) (abnormal). Her blood pressure is 155/46 (abnormal) and her pulse is 64. Her respiration is 16 and oxygen saturation is 100%.   Wt Readings from Last 3 Encounters:  08/05/21 103 lb (46.7 kg)  02/20/21 110 lb 4.8 oz (50 kg)  02/19/21 111 lb (50.3 kg)    Physical Exam Vitals reviewed.  HENT:     Head: Normocephalic and atraumatic.  Eyes:     Pupils: Pupils are equal, round, and reactive to light.  Cardiovascular:     Rate and Rhythm: Normal rate and regular rhythm.     Heart sounds: Normal heart sounds.     Comments: Cardiac exam shows a regular rate and rhythm with occasional extra beat.  She has a 1/6 systolic ejection murmur. Pulmonary:     Effort: Pulmonary effort is normal.     Breath sounds: Normal breath sounds.  Abdominal:     General: Bowel sounds  are normal.     Palpations: Abdomen is soft.     Comments: Her abdominal exam is soft.  Bowel sounds are present.  There is no obvious fluid wave.  There is no obvious abdominal mass.  Musculoskeletal:        General: No tenderness or deformity. Normal range of motion.     Cervical  back: Normal range of motion.     Comments: Extremities shows some symmetric weakness in upper and lower extremities.  She has some arthritic changes in her joints.  She has decent pulses in her distal extremities.  She has some trace edema in her lower legs.  Lymphadenopathy:     Cervical: No cervical adenopathy.  Skin:    General: Skin is warm and dry.     Findings: No erythema or rash.  Neurological:     Mental Status: She is alert and oriented to person, place, and time.  Psychiatric:        Behavior: Behavior normal.        Thought Content: Thought content normal.        Judgment: Judgment normal.      Lab Results  Component Value Date   WBC 9.6 08/05/2021   HGB 13.3 08/05/2021   HCT 42.0 08/05/2021   MCV 89.6 08/05/2021   PLT 215 08/05/2021     Chemistry      Component Value Date/Time   NA 139 08/05/2021 0928   K 4.6 08/05/2021 0928   CL 104 08/05/2021 0928   CO2 26 08/05/2021 0928   BUN 23 08/05/2021 0928   CREATININE 1.10 (H) 08/05/2021 0928      Component Value Date/Time   CALCIUM 10.7 (H) 08/05/2021 0928   ALKPHOS 44 08/05/2021 0928   AST 13 (L) 08/05/2021 0928   ALT 10 08/05/2021 0928   BILITOT 0.6 08/05/2021 0928       Impression and Plan: Ms. Cammack is a very charming 88 year old white female.  She has a mesenteric mass.  I suspect this is a carcinoid by the Chromogranin A.  We will have to see what the CT of the pelvis shows.  I would not think that this mass is growing.  We have just watch this very cautiously.  We will see what her iron studies also show.  For right now, we will just plan for a follow-up in another 6 months.  This works out well.  We always do the  CT scan the same day that we see her.  She is a lot of fun to talk to.  Given her advanced age, she certainly is doing quite nicely.  She is trying to enjoy a good quality of life.     Volanda Napoleon, MD 6/28/20234:52 PM

## 2021-08-06 ENCOUNTER — Telehealth: Payer: Self-pay | Admitting: *Deleted

## 2021-08-06 NOTE — Telephone Encounter (Signed)
-----   Message from Volanda Napoleon, MD sent at 08/06/2021  3:18 PM EDT ----- Call her dgtr -- There is NO change in the mesenteric mass -- it measures 3.6 x 1.8 cm!!!   Demetrios Isaacs!!!  Laurey Arrow

## 2021-08-06 NOTE — Telephone Encounter (Signed)
As noted below by Dr. Marin Olp, I informed Deanna [name], the daughter, that there is NO change in the mesenteric mass. It measures 3.6 x 1.8cm. She verbalized understanding.

## 2021-08-07 LAB — CHROMOGRANIN A: Chromogranin A (ng/mL): 836.4 ng/mL — ABNORMAL HIGH (ref 0.0–101.8)

## 2021-08-12 ENCOUNTER — Telehealth: Payer: Self-pay | Admitting: Hematology & Oncology

## 2021-08-12 NOTE — Telephone Encounter (Signed)
Called to schedule per 6/28 los , spoke to patients daughter she would like to call us back to schedule

## 2021-08-26 ENCOUNTER — Encounter (HOSPITAL_BASED_OUTPATIENT_CLINIC_OR_DEPARTMENT_OTHER): Payer: Self-pay | Admitting: Cardiovascular Disease

## 2021-08-26 ENCOUNTER — Ambulatory Visit (INDEPENDENT_AMBULATORY_CARE_PROVIDER_SITE_OTHER): Payer: Medicare Other | Admitting: Cardiovascular Disease

## 2021-08-26 VITALS — BP 152/52 | HR 58 | Ht 62.0 in | Wt 109.2 lb

## 2021-08-26 DIAGNOSIS — R079 Chest pain, unspecified: Secondary | ICD-10-CM

## 2021-08-26 DIAGNOSIS — E7800 Pure hypercholesterolemia, unspecified: Secondary | ICD-10-CM | POA: Diagnosis not present

## 2021-08-26 DIAGNOSIS — I480 Paroxysmal atrial fibrillation: Secondary | ICD-10-CM

## 2021-08-26 DIAGNOSIS — R0602 Shortness of breath: Secondary | ICD-10-CM

## 2021-08-26 DIAGNOSIS — I10 Essential (primary) hypertension: Secondary | ICD-10-CM | POA: Diagnosis not present

## 2021-08-26 NOTE — Progress Notes (Signed)
.    Cardiology Office Note   Date:  08/26/2021   ID:  Deanna [name], DOB 22-Nov-1930, MRN [id number]  PCP:  Ginger Organ., MD  Cardiologist:   [name], MD   No chief complaint on file.  History of Present Illness: Deanna [name] is a 88 y.o. female with paroxysmal atrial fibrillation, chronic left bundle branch block, diabetes, aortic atherosclerosis, malignant carcinoid tumor, and pulmonary hypertension who presents for follow-up.  She was first diagnosed with atrial fibrillation 07/2019 in the setting of sepsis and colitis.  Her heart rates were controlled and she was started on Eliquis.  Echo that admission revealed LVEF 60 to 65% with moderate LVH and indeterminate diastolic function.  However she developed GI bleeding requiring transfusions her Eliquis was discontinued.  She was subsequently hospitalized at [facility] 08/2019 with a transfusion reaction.  She followed up with atrial fibrillation clinic 08/2019 and was maintaining sinus rhythm.  She has also struggled with symptoms of carcinoid.  She was found to have a mesenteric mass and elevated Chromogranin A levels.  She follows with Dr. Marin Olp for this.  She was treated with a round of steroids and her diarrhea has improved.  She continued to have occasional flushing.  She was not started on anticoagulation due to GI bleed and carcinoid tumor.  She last saw Dr. Marin Olp 6/28 and there was no change on her CT imaging.  Her chromogranin A levels had been improving.  However he was concerned about possible pelvic mass and plan to include that on her next imaging in 6 months.  Deanna [name] has struggled with falls.  Her blood pressure was controlled but was labile.  She did struggle with intravascular volume depletion.  We stopped HCTZ and amlodipine was increased.  However at her last appointment she continued to have falls that were more mechanical than orthostatic.  Her blood pressures were in the 170s, so losartan was added.   She has struggled with her glucose and is working with Dr. Brigitte Pulse.  Her BP at home has been labile.  It has been as low as 87/54 and up to 162/79.  On average it has been in the 120s/60s.  She had a mechanical fall while gardening and tripped.     Past Medical History:  Diagnosis Date   Arthritis    all over   Cancer [facility])    breast   Depression    Diabetes mellitus without complication ([place])    diet controlled   Goals of care, counseling/discussion 08/17/2019   Hypertension    Iron deficiency anemia due to chronic blood loss 08/17/2019   Lower GI bleed 08/17/2019   Malignant carcinoid tumor of duodenum ([place]) 09/14/2019   PAF (paroxysmal atrial fibrillation) ([place]) 07/30/2019   Pure hypercholesterolemia 11/26/2020    Past Surgical History:  Procedure Laterality Date   APPENDECTOMY     with lower back surgery   BACK SURGERY     x2   BIOPSY  08/02/2019   Procedure: BIOPSY;  Surgeon: Yetta Flock, MD;  Location: [facility] ENDOSCOPY;  Service: Gastroenterology;;   BREAST SURGERY Left 1995   complete left mastectomy   CERVICAL SPINE SURGERY     COLONOSCOPY WITH PROPOFOL N/A 08/02/2019   Procedure: COLONOSCOPY WITH PROPOFOL;  Surgeon: Yetta Flock, MD;  Location: [facility] ENDOSCOPY;  Service: Gastroenterology;  Laterality: N/A;   EYE SURGERY Bilateral    cataracts   TONSILLECTOMY     at age 68  Current Outpatient Medications  Medication Sig Dispense Refill   acetaminophen (TYLENOL) 325 MG tablet Take 650 mg by mouth 2 (two) times a day.      amLODipine (NORVASC) 10 MG tablet Take 1 tablet (10 mg total) by mouth daily. 90 tablet 3   buPROPion (WELLBUTRIN XL) 150 MG 24 hr tablet Take 150 mg by mouth daily.     carvedilol (COREG) 25 MG tablet Take 1 tablet (25 mg total) by mouth 2 (two) times daily with a meal.     Cyanocobalamin (VITAMIN B 12 PO) Take 1,000 mcg by mouth daily.     cyclobenzaprine (FLEXERIL) 5 MG tablet Take 5 mg by mouth every 8 (eight) hours as needed for muscle  spasms.     cycloSPORINE (RESTASIS) 0.05 % ophthalmic emulsion Place 1 drop into both eyes 2 (two) times daily.     diclofenac Sodium (VOLTAREN) 1 % GEL Apply 2 g topically 4 (four) times daily as needed (painful joints).     escitalopram (LEXAPRO) 20 MG tablet Take 20 mg by mouth daily.     esomeprazole (NEXIUM) 40 MG capsule Take 40 mg by mouth 2 (two) times daily before a meal.     feeding supplement, ENSURE ENLIVE, (ENSURE ENLIVE) LIQD Take 237 mLs by mouth 3 (three) times daily between meals. 237 mL 12   FREESTYLE LITE test strip      insulin glargine (LANTUS) 100 UNIT/ML injection Inject 0.05 mLs (5 Units total) into the skin daily. (Patient taking differently: Inject 18 Units into the skin daily.)     loperamide (IMODIUM A-D) 2 MG tablet Take 2 mg by mouth 4 (four) times daily as needed for diarrhea or loose stools.     losartan (COZAAR) 25 MG tablet Take 1 tablet (25 mg total) by mouth daily. 90 tablet 3   mesalamine (LIALDA) 1.2 g EC tablet Take 2.4 g by mouth daily with breakfast.     NOVOLOG FLEXPEN 100 UNIT/ML FlexPen Inject into the skin.     pregabalin (LYRICA) 75 MG capsule Take 1 capsule (75 mg total) by mouth 2 (two) times daily. 10 capsule 0   Probiotic Product (PROBIOTIC-10 PO) Take 1 capsule by mouth daily.     rosuvastatin (CRESTOR) 10 MG tablet Take 10 mg by mouth at bedtime.      SURE COMFORT PEN NEEDLES 31G X 5 MM MISC      SYMAX-SR 0.375 MG 12 hr tablet Take 0.375 mg by mouth every 12 (twelve) hours as needed for pain.     traMADol (ULTRAM) 50 MG tablet Take 50 mg by mouth 3 (three) times daily as needed.     VITAMIN D, CHOLECALCIFEROL, PO Take 400 Units by mouth daily.      vitamin E 400 UNIT capsule Take 400 Units by mouth 2 (two) times a day.      denosumab (PROLIA) 60 MG/ML SOLN injection Inject 60 mg into the skin every 6 (six) months. Administer in upper arm, thigh, or abdomen (Patient not taking: Reported on 08/26/2021)     No current facility-administered  medications for this visit.    Allergies:   Codeine    Social History:  The patient  reports that she has never smoked. She has never used smokeless tobacco. She reports current alcohol use of about 1.0 standard drink of alcohol per week. She reports that she does not use drugs.   Family History:  The patient's family history includes Heart disease in her father and mother; Hypertension  in her father and mother.    ROS:  Please see the history of present illness.    (+) Chest discomfort (+) Leg Pain (Posterior R Leg) (+) Falls All other systems are reviewed and negative.    PHYSICAL EXAM: VS:  BP (!) 152/52 (BP Location: Right Arm, Patient Position: Sitting, Cuff Size: Normal)   Pulse (!) 58   Ht '5\' 2"'$  (1.575 m)   Wt 109 lb 3.2 oz (49.5 kg)   SpO2 96%   BMI 19.97 kg/m  , BMI Body mass index is 19.97 kg/m. GENERAL:  Well appearing.  Frail. HEENT: Pupils equal round and reactive, fundi not visualized, oral mucosa unremarkable NECK:  No jugular venous distention, waveform within normal limits, carotid upstroke brisk and symmetric, no bruits, no thyromegaly LUNGS:  Clear to auscultation bilaterally HEART:  RRR.  PMI not displaced or sustained,S1 and S2 within normal limits, no S3, no S4, no clicks, no rubs, no murmurs ABD:  Flat, positive bowel sounds normal in frequency in pitch, no bruits, no rebound, no guarding, no midline pulsatile mass, no hepatomegaly, no splenomegaly EXT:  2 plus pulses throughout, 1+ ankle edema, no cyanosis no clubbing SKIN:  No rashes no nodules NEURO:  Cranial nerves II through XII grossly intact, motor grossly intact throughout PSYCH:  Cognitively intact, oriented to person place and time  EKG:   11/26/2020: Sinus bradycardia Rate 51 bpm Prior anteroseptal infarct 3/22: Sinus rhythm.  Rate 64 bpm.  IVCD.  Prior anterior infarct. 11/29/2019:  sinus rhythm.  Rate 66 bpm.  LBBB.  R axis deviation  Echo 07/31/19   1. Left ventricular ejection fraction,  by estimation, is 60 to 65%. The  left ventricle has normal function. The left ventricle has no regional  wall motion abnormalities. There is moderate concentric left ventricular  hypertrophy. Left ventricular  diastolic parameters are indeterminate. Elevated left ventricular  end-diastolic pressure.   2. Right ventricular systolic function is normal. The right ventricular  size is normal. There is mildly elevated pulmonary artery systolic  pressure. The estimated right ventricular systolic pressure is 16.1 mmHg.   3. The mitral valve is normal in structure. Mild mitral valve  regurgitation. No evidence of mitral stenosis.   4. Tricuspid valve regurgitation is moderate.   5. The aortic valve is tricuspid. Aortic valve regurgitation is not  visualized. Mild to moderate aortic valve sclerosis/calcification is  present, without any evidence of aortic stenosis.   6. The inferior vena cava is normal in size with greater than 50%  respiratory variability, suggesting right atrial pressure of 3 mmHg.    Recent Labs: 08/05/2021: ALT 10; BUN 23; Creatinine 1.10; Hemoglobin 13.3; Platelet Count 215; Potassium 4.6; Sodium 139    Lipid Panel No results found for: "CHOL", "TRIG", "HDL", "CHOLHDL", "VLDL", "LDLCALC", "LDLDIRECT"    Wt Readings from Last 3 Encounters:  08/26/21 109 lb 3.2 oz (49.5 kg)  08/05/21 103 lb (46.7 kg)  02/20/21 110 lb 4.8 oz (50 kg)      ASSESSMENT AND PLAN: Essential hypertension Blood pressure is elevated both here and at home.  On average its in the 120s over 10s.  She is also had some hypotension down to the 80s.  Therefore we will not adjust her medicines at this time.  Her diastolic blood pressures are also quite low and she has occasional chest pain.  Continue with her current regimen of losartan, carvedilol, and amlodipine.  Shortness of breath She reports exertional dyspnea.  She also has occasional  sharp chest pain, though this does not occur at the same  time as her shortness of breath.  She never had assessment of her coronaries.  We will get a Lexiscan Myoview to assess for ischemia.  She is euvolemic on exam.  I do not hear any murmurs.  No echo at this time.  Shared Decision Making/Informed Consent The risks [chest pain, shortness of breath, cardiac arrhythmias, dizziness, blood pressure fluctuations, myocardial infarction, stroke/transient ischemic attack, nausea, vomiting, allergic reaction, radiation exposure, metallic taste sensation and life-threatening complications (estimated to be 1 in 10,000)], benefits (risk stratification, diagnosing coronary artery disease, treatment guidance) and alternatives of a nuclear stress test were discussed in detail with Deanna [name] and she agrees to proceed.      Pure hypercholesterolemia Lipids are not well controlled.  She has lab work due next month with her PCP.  If her LDL is greater than 70, would increase her rosuvastatin to at least 20 mg.  She has aortic atherosclerosis.  PAF (paroxysmal atrial fibrillation) ([place]) She continues to maintain sinus rhythm.  She is not on anticoagulation due to history of GI bleed.  Continue carvedilol.   Current medicines are reviewed at length with the patient today.  The patient does not have concerns regarding medicines.  The following changes have been made: Switch diltiazem to amlodipine  Labs/ tests ordered today include:   No orders of the defined types were placed in this encounter.    Disposition:   FU with Deanna Schmuhl C. Oval Linsey, MD, [facility] in 6 months      Signed, Deanna Dario C. Oval Linsey, MD, [facility]  08/26/2021 9:56 AM    [place]

## 2021-08-26 NOTE — Patient Instructions (Signed)
Medication Instructions:  Your physician recommends that you continue on your current medications as directed. Please refer to the Current Medication list given to you today.   *If you need a refill on your cardiac medications before your next appointment, please call your pharmacy*  Lab Work: NONE  Testing/Procedures: Your physician has requested that you have a lexiscan myoview. For further information please visit [email or web address]. Please follow instruction sheet, as given.   Follow-Up: At [facility], you and your health needs are our priority.  As part of our continuing mission to provide you with exceptional heart care, we have created designated Provider Care Teams.  These Care Teams include your primary Cardiologist (physician) and Advanced Practice Providers (APPs -  Physician Assistants and Nurse Practitioners) who all work together to provide you with the care you need, when you need it.  We recommend signing up for the patient portal called "MyChart".  Sign up information is provided on this After Visit Summary.  MyChart is used to connect with patients for Virtual Visits (Telemedicine).  Patients are able to view lab/test results, encounter notes, upcoming appointments, etc.  Non-urgent messages can be sent to yo ur provider as well.   To learn more about what you can do with MyChart, go to [email or web address].    Your next appointment:   6 month(s)  The format for your next appointment:   In Person  Provider:   DR [facility] OR [place] Mam NP

## 2021-08-26 NOTE — Assessment & Plan Note (Signed)
Blood pressure is elevated both here and at home.  On average its in the 120s over 30s.  She is also had some hypotension down to the 80s.  Therefore we will not adjust her medicines at this time.  Her diastolic blood pressures are also quite low and she has occasional chest pain.  Continue with her current regimen of losartan, carvedilol, and amlodipine.

## 2021-08-26 NOTE — Assessment & Plan Note (Signed)
Lipids are not well controlled.  She has lab work due next month with her PCP.  If her LDL is greater than 70, would increase her rosuvastatin to at least 20 mg.  She has aortic atherosclerosis.

## 2021-08-26 NOTE — Assessment & Plan Note (Addendum)
She reports exertional dyspnea.  She also has occasional sharp chest pain, though this does not occur at the same time as her shortness of breath.  She never had assessment of her coronaries.  We will get a Lexiscan Myoview to assess for ischemia.  She is euvolemic on exam.  I do not hear any murmurs.  No echo at this time.  Shared Decision Making/Informed Consent The risks [chest pain, shortness of breath, cardiac arrhythmias, dizziness, blood pressure fluctuations, myocardial infarction, stroke/transient ischemic attack, nausea, vomiting, allergic reaction, radiation exposure, metallic taste sensation and life-threatening complications (estimated to be 1 in 10,000)], benefits (risk stratification, diagnosing coronary artery disease, treatment guidance) and alternatives of a nuclear stress test were discussed in detail with Deanna [name] and she agrees to proceed.

## 2021-08-26 NOTE — Assessment & Plan Note (Signed)
She continues to maintain sinus rhythm.  She is not on anticoagulation due to history of GI bleed.  Continue carvedilol.

## 2021-10-02 ENCOUNTER — Encounter: Payer: Self-pay | Admitting: Family

## 2021-10-07 ENCOUNTER — Ambulatory Visit (HOSPITAL_COMMUNITY)
Admission: RE | Admit: 2021-10-07 | Discharge: 2021-10-07 | Disposition: A | Discharge status: Home / Self Care | Payer: Medicare Other | Source: Ambulatory Visit | Attending: Internal Medicine | Admitting: Internal Medicine

## 2021-10-07 DIAGNOSIS — I10 Essential (primary) hypertension: Secondary | ICD-10-CM | POA: Diagnosis present

## 2021-10-07 DIAGNOSIS — R079 Chest pain, unspecified: Secondary | ICD-10-CM | POA: Diagnosis present

## 2021-10-07 DIAGNOSIS — R0602 Shortness of breath: Secondary | ICD-10-CM | POA: Diagnosis not present

## 2021-10-07 DIAGNOSIS — E7800 Pure hypercholesterolemia, unspecified: Secondary | ICD-10-CM | POA: Insufficient documentation

## 2021-10-07 DIAGNOSIS — I480 Paroxysmal atrial fibrillation: Secondary | ICD-10-CM | POA: Diagnosis not present

## 2021-10-07 MED ORDER — AMINOPHYLLINE 25 MG/ML IV SOLN
75.0000 mg | Freq: Once | INTRAVENOUS | Status: AC
  Administered 2021-10-07: 75 mg via INTRAVENOUS

## 2021-10-07 MED ORDER — TECHNETIUM TC 99M TETROFOSMIN IV KIT
10.5000 | PACK | Freq: Once | INTRAVENOUS | Status: AC | PRN
  Administered 2021-10-07: 10.5 via INTRAVENOUS

## 2021-10-07 MED ORDER — TECHNETIUM TC 99M TETROFOSMIN IV KIT
31.4000 | PACK | Freq: Once | INTRAVENOUS | Status: AC | PRN
Start: 2021-10-07 — End: 2021-10-07
  Administered 2021-10-07: 31.4 via INTRAVENOUS

## 2021-10-07 MED ORDER — REGADENOSON 0.4 MG/5ML IV SOLN
0.4000 mg | Freq: Once | INTRAVENOUS | Status: AC
  Administered 2021-10-07: 0.4 mg via INTRAVENOUS

## 2021-10-08 ENCOUNTER — Encounter (HOSPITAL_COMMUNITY): Payer: TRICARE For Life (TFL)

## 2021-10-08 LAB — MYOCARDIAL PERFUSION IMAGING
LV dias vol: 62 mL (ref 46–106)
LV sys vol: 17 mL
Nuc Stress EF: 72 %
Peak HR: 73 {beats}/min
Rest HR: 55 {beats}/min
Rest Nuclear Isotope Dose: 10.5 mCi
SDS: 3
SRS: 4
SSS: 7
Stress Nuclear Isotope Dose: 31.4 mCi
TID: 1.02

## 2022-01-07 ENCOUNTER — Telehealth: Payer: Self-pay | Admitting: Gastroenterology

## 2022-01-07 NOTE — Telephone Encounter (Signed)
Good afternoon Dr. Havery Moros,   Patients daughter called requesting a transfer of care for patient. She is requesting the transfer due to Dr. Earlean Shawl retiring. Looking at patients chart she was seen by you in June of 2021 at [facility] for a colonoscopy. Patients daughter stated that she is wanting patient to be seen by Dr. Hilarie Fredrickson because she is a patient of his herself. Patient is needing to be seen for issues with diarrhea. Patients records from Dr. Earlean Shawl are in epic, will you please review and advise on scheduling patient? And if transfer of care is accepted are you okay with Dr. Hilarie Fredrickson taking on patient if he will accept as well?   Thank you.

## 2022-01-07 NOTE — Telephone Encounter (Signed)
If they are specifically requesting Dr. Hilarie Fredrickson I would ask him initially to review and if he accepts this patient. If he does not, can send back to me. Thanks

## 2022-01-08 NOTE — Telephone Encounter (Signed)
ok 

## 2022-01-08 NOTE — Telephone Encounter (Signed)
Dr. Hilarie Fredrickson, patients daughter is wanting patient to transfer her care to you from Dr. Earlean Shawl. Will you please review her records via epic and advise on scheduling patient?  Thank you.

## 2022-01-13 ENCOUNTER — Ambulatory Visit: Payer: Medicare Other | Admitting: Nurse Practitioner

## 2022-02-16 ENCOUNTER — Ambulatory Visit: Payer: Medicare Other | Admitting: Gastroenterology

## 2022-03-16 NOTE — Progress Notes (Signed)
Cardiology Office Note  Date:  03/17/2022   ID:  Deanna [name], DOB [birth year], MRN [id number]  PCP:  Ginger Organ., MD  Cardiologist:   [name], MD   No chief complaint on file.  History of Present Illness: Deanna [name] is a 89 y.o. female with paroxysmal atrial fibrillation, chronic left bundle branch block, diabetes, aortic atherosclerosis, malignant carcinoid tumor, and pulmonary hypertension who presents for follow-up.  She was first diagnosed with atrial fibrillation 07/2019 in the setting of sepsis and colitis.  Her heart rates were controlled and she was started on Eliquis.  Echo that admission revealed LVEF 60 to 65% with moderate LVH and indeterminate diastolic function.  However she developed GI bleeding requiring transfusions her Eliquis was discontinued.  She was subsequently hospitalized at [facility] 08/2019 with a transfusion reaction.  She followed up with atrial fibrillation clinic 08/2019 and was maintaining sinus rhythm.  She has also struggled with symptoms of carcinoid.  She was found to have a mesenteric mass and elevated Chromogranin A levels.  She follows with Dr. Marin Olp for this.  She was treated with a round of steroids and her diarrhea has improved.  She continued to have occasional flushing.  She was not started on anticoagulation due to GI bleed and carcinoid tumor.  She last saw Dr. Marin Olp 6/28 and there was no change on her CT imaging.  Her chromogranin A levels had been improving.  However he was concerned about possible pelvic mass and plan to include that on her next imaging in 6 months.  Deanna [name] has struggled with falls.  Her blood pressure was controlled but was labile.  She did struggle with intravascular volume depletion.  We stopped HCTZ and amlodipine was increased.  Her blood pressures were in the 170s, so losartan was added. She reported exertional dyspnea and had a nuclear stress test 09/2021 that was low risk. LVEF was 72%.   Today, she  is accompanied by her daughter. She is feeling pretty good overall. Her blood pressure at home is typically controlled, normally 120-130/75. Yesterday it was somewhat low (101/70) and she felt kind of tired. In clinic today her BP is initially 148/72, improved to 134/58 on recheck. For exercise she has been walking routinely, and using the stationary bike at the gym for about 10 minutes. She also works with PT for core exercises. Generally she feels well with exercise, she does not become very fatigued. No anginal symptoms. However, once in a while she will have a mild tightness in her chest for maybe 10 seconds. She is sitting or lying down when her chest tightness occurs. She does wear compression socks most of the time. She complains of "spots" on her legs, currently treated with a cream. Her daughter reports that she tends to have a GI flare-up every year. This occurred last November and December. She is finishing her medication taper. Initially she had developed diarrhea one week after starting a new injectable medication. She had been on Prolia for years but it seems to not be as effective. Additionally she is following with neurology for her back and left hip pain. She denies any palpitations, shortness of breath, lightheadedness, headaches, syncope, orthopnea, or PND.    Past Medical History:  Diagnosis Date   Arthritis    all over   Cancer [facility])    breast   Depression    Diabetes mellitus without complication ([place])    diet controlled   Goals of care, counseling/discussion  08/17/2019   Hypertension    Iron deficiency anemia due to chronic blood loss 08/17/2019   Lower GI bleed 08/17/2019   Malignant carcinoid tumor of duodenum ([place]) 09/14/2019   PAF (paroxysmal atrial fibrillation) ([place]) 07/30/2019   Pure hypercholesterolemia 11/26/2020    Past Surgical History:  Procedure Laterality Date   APPENDECTOMY     with lower back surgery   BACK SURGERY     x2   BIOPSY  08/02/2019   Procedure:  BIOPSY;  Surgeon: Yetta Flock, MD;  Location: [facility];  Service: Gastroenterology;;   BREAST SURGERY Left 1995   complete left mastectomy   CERVICAL SPINE SURGERY     COLONOSCOPY WITH PROPOFOL N/A 08/02/2019   Procedure: COLONOSCOPY WITH PROPOFOL;  Surgeon: Yetta Flock, MD;  Location: [facility];  Service: Gastroenterology;  Laterality: N/A;   EYE SURGERY Bilateral    cataracts   TONSILLECTOMY     at age 76     Current Outpatient Medications  Medication Sig Dispense Refill   acetaminophen (TYLENOL) 325 MG tablet Take 650 mg by mouth 2 (two) times a day.      amLODipine (NORVASC) 10 MG tablet Take 1 tablet (10 mg total) by mouth daily. 90 tablet 3   buPROPion (WELLBUTRIN XL) 150 MG 24 hr tablet Take 150 mg by mouth daily.     carvedilol (COREG) 25 MG tablet Take 1 tablet (25 mg total) by mouth 2 (two) times daily with a meal.     Cyanocobalamin (VITAMIN B 12 PO) Take 1,000 mcg by mouth daily.     cyclobenzaprine (FLEXERIL) 5 MG tablet Take 5 mg by mouth every 8 (eight) hours as needed for muscle spasms.     cycloSPORINE (RESTASIS) 0.05 % ophthalmic emulsion Place 1 drop into both eyes 2 (two) times daily.     denosumab (PROLIA) 60 MG/ML SOLN injection Inject 60 mg into the skin every 6 (six) months. Administer in upper arm, thigh, or abdomen     diclofenac Sodium (VOLTAREN) 1 % GEL Apply 2 g topically 4 (four) times daily as needed (painful joints).     escitalopram (LEXAPRO) 20 MG tablet Take 20 mg by mouth daily.     esomeprazole (NEXIUM) 40 MG capsule Take 40 mg by mouth 2 (two) times daily before a meal.     feeding supplement, ENSURE ENLIVE, (ENSURE ENLIVE) LIQD Take 237 mLs by mouth 3 (three) times daily between meals. 237 mL 12   FREESTYLE LITE test strip      insulin glargine (LANTUS) 100 UNIT/ML injection Inject 0.05 mLs (5 Units total) into the skin daily. (Patient taking differently: Inject 18 Units into the skin daily.)     loperamide (IMODIUM A-D) 2 MG  tablet Take 2 mg by mouth 4 (four) times daily as needed for diarrhea or loose stools.     NOVOLOG FLEXPEN 100 UNIT/ML FlexPen Inject into the skin.     pregabalin (LYRICA) 75 MG capsule Take 1 capsule (75 mg total) by mouth 2 (two) times daily. 10 capsule 0   Probiotic Product (PROBIOTIC-10 PO) Take 1 capsule by mouth daily.     SURE COMFORT PEN NEEDLES 31G X 5 MM MISC      SYMAX-SR 0.375 MG 12 hr tablet Take 0.375 mg by mouth every 12 (twelve) hours as needed for pain.     traMADol (ULTRAM) 50 MG tablet Take 50 mg by mouth 3 (three) times daily as needed.     VITAMIN D, CHOLECALCIFEROL, PO  Take 400 Units by mouth daily.      vitamin E 400 UNIT capsule Take 400 Units by mouth 2 (two) times a day.      ALPRAZolam (XANAX) 0.25 MG tablet Take 0.25 mg by mouth as needed.     losartan (COZAAR) 25 MG tablet Take 1 tablet (25 mg total) by mouth daily. 90 tablet 3   rosuvastatin (CRESTOR) 20 MG tablet Take 1 tablet (20 mg total) by mouth at bedtime. 90 tablet 3   No current facility-administered medications for this visit.    Allergies:   Codeine    Social History:  The patient  reports that she has never smoked. She has never used smokeless tobacco. She reports current alcohol use of about 1.0 standard drink of alcohol per week. She reports that she does not use drugs.   Family History:  The patient's family history includes Heart disease in her father and mother; Hypertension in her father and mother.    ROS:   Please see the history of present illness.    (+) Mild chest tightness (+) Back pain (+) Left hip pain All other systems are reviewed and negative.    PHYSICAL EXAM: VS:  BP (!) 134/58 (BP Location: Right Arm, Patient Position: Sitting, Cuff Size: Normal)   Pulse 62   Ht '5\' 2"'$  (1.575 m)   Wt 101 lb 8 oz (46 kg)   BMI 18.56 kg/m  , BMI Body mass index is 18.56 kg/m. GENERAL:  Well appearing.  Frail. HEENT: Pupils equal round and reactive, fundi not visualized, oral mucosa  unremarkable NECK:  No jugular venous distention, waveform within normal limits, carotid upstroke brisk and symmetric, no bruits, no thyromegaly LUNGS:  Clear to auscultation bilaterally HEART:  RRR.  PMI not displaced or sustained,S1 and S2 within normal limits, no S3, no S4, no clicks, no rubs, no murmurs ABD:  Flat, positive bowel sounds normal in frequency in pitch, no bruits, no rebound, no guarding, no midline pulsatile mass, no hepatomegaly, no splenomegaly EXT:  2 plus pulses throughout, no edema, no cyanosis, no clubbing SKIN:  No rashes no nodules NEURO:  Cranial nerves II through XII grossly intact, motor grossly intact throughout PSYCH:  Cognitively intact, oriented to person place and time  EKG:  EKG is personally reviewed. 03/17/2022:  Sinus rhythm. Rate 62 bpm. LBBB. 11/26/2020: Sinus bradycardia Rate 51 bpm Prior anteroseptal infarct 3/22: Sinus rhythm.  Rate 64 bpm.  IVCD.  Prior anterior infarct. 11/29/2019:  sinus rhythm.  Rate 66 bpm.  LBBB.  R axis deviation  Lexiscan Stress Myoview  10/07/2021:   Lexiscan stress is electrically nondiagnostic for ischemia.   Myoview scan shows normal perfusion.  No ischemia or scar   LVEF calculated at 72% with normal wall motion   Prior study not available for comparison.   Low risk study.  Echo 07/31/19   1. Left ventricular ejection fraction, by estimation, is 60 to 65%. The  left ventricle has normal function. The left ventricle has no regional  wall motion abnormalities. There is moderate concentric left ventricular  hypertrophy. Left ventricular  diastolic parameters are indeterminate. Elevated left ventricular  end-diastolic pressure.   2. Right ventricular systolic function is normal. The right ventricular  size is normal. There is mildly elevated pulmonary artery systolic  pressure. The estimated right ventricular systolic pressure is 16.1 mmHg.   3. The mitral valve is normal in structure. Mild mitral valve  regurgitation.  No evidence of mitral stenosis.  4. Tricuspid valve regurgitation is moderate.   5. The aortic valve is tricuspid. Aortic valve regurgitation is not  visualized. Mild to moderate aortic valve sclerosis/calcification is  present, without any evidence of aortic stenosis.   6. The inferior vena cava is normal in size with greater than 50%  respiratory variability, suggesting right atrial pressure of 3 mmHg.    Recent Labs: 08/05/2021: ALT 10; BUN 23; Creatinine 1.10; Hemoglobin 13.3; Platelet Count 215; Potassium 4.6; Sodium 139    Lipid Panel No results found for: "CHOL", "TRIG", "HDL", "CHOLHDL", "VLDL", "LDLCALC", "LDLDIRECT"    Wt Readings from Last 3 Encounters:  03/17/22 101 lb 8 oz (46 kg)  10/07/21 109 lb (49.4 kg)  08/26/21 109 lb 3.2 oz (49.5 kg)      ASSESSMENT AND PLAN:  Essential hypertension BP has been generally controlled.  It was initially elevated but better on repeat.  Yesterday it was 101/70.  It is typically 120-130/75.  Continue carvedilol, amlodipine, and losartan.  Pure hypercholesterolemia Patient has aortic atherosclerosis.  LDL goal is less than 70.  Recommend increasing rosuvastatin to 20 mg.  Repeat fasting lipids and a CMP in 3 months.  Aortic atherosclerosis   Disposition:   FU with Deanna Shetley C. Oval Linsey, MD, [facility] in 1 year.   Medication Adjustments/Labs and Tests Ordered: Current medicines are reviewed at length with the patient today.  Concerns regarding medicines are outlined above.   Orders Placed This Encounter  Procedures   Comprehensive metabolic panel   Lipid panel   EKG 12-Lead   Meds ordered this encounter  Medications   DISCONTD: rosuvastatin (CRESTOR) 20 MG tablet    Sig: Take 1 tablet (20 mg total) by mouth at bedtime.    Dispense:  90 tablet    Refill:  3    NEW DOSE, D/C 10  MG RX   rosuvastatin (CRESTOR) 20 MG tablet    Sig: Take 1 tablet (20 mg total) by mouth at bedtime.    Dispense:  90 tablet    Refill:  3    NEW  DOSE, D/C 10  MG RX   Patient Instructions  Medication Instructions:  INCREASE ROSUVASTATIN TO 20 MG DAILY   *If you need a refill on your cardiac medications before your next appointment, please call your pharmacy*  Lab Work: FASTING LP/CMET IN 3 MONTHS   If you have labs (blood work) drawn today and your tests are completely normal, you will receive your results only by: [place] (if you have MyChart) OR A paper copy in the mail If you have any lab test that is abnormal or we need to change your treatment, we will call you to review the results.  Testing/Procedures: NONE  Follow-Up: At [facility], you and your health needs are our priority.  As part of our continuing mission to provide you with exceptional heart care, we have created designated Provider Care Teams.  These Care Teams include your primary Cardiologist (physician) and Advanced Practice Providers (APPs -  Physician Assistants and Nurse Practitioners) who all work together to provide you with the care you need, when you need it.  We recommend signing up for the patient portal called "MyChart".  Sign up information is provided on this After Visit Summary.  MyChart is used to connect with patients for Virtual Visits (Telemedicine).  Patients are able to view lab/test results, encounter notes, upcoming appointments, etc.  Non-urgent messages can be sent to your provider as well.   To learn  more about what you can do with MyChart, go to [email or web address].    Your next appointment:   12 month(s)  Provider:   [name], MD        [facility],acting as a scribe for [name], MD.,have documented all relevant documentation on the behalf of [name], MD,as directed by  [name], MD while in the presence of [name], MD.  I, Latta Oval Linsey, MD have reviewed all documentation for this visit.  The documentation of the exam, diagnosis, procedures, and orders  on 03/17/2022 are all accurate and complete.   Signed, Kenya Kook C. Oval Linsey, MD, [facility]  03/17/2022 9:47 AM    Deanna [name]

## 2022-03-17 ENCOUNTER — Ambulatory Visit (INDEPENDENT_AMBULATORY_CARE_PROVIDER_SITE_OTHER): Payer: Medicare Other | Admitting: Cardiovascular Disease

## 2022-03-17 ENCOUNTER — Encounter (HOSPITAL_BASED_OUTPATIENT_CLINIC_OR_DEPARTMENT_OTHER): Payer: Self-pay | Admitting: Cardiovascular Disease

## 2022-03-17 VITALS — BP 134/58 | HR 62 | Ht 62.0 in | Wt 101.5 lb

## 2022-03-17 DIAGNOSIS — E7800 Pure hypercholesterolemia, unspecified: Secondary | ICD-10-CM | POA: Diagnosis not present

## 2022-03-17 DIAGNOSIS — Z5181 Encounter for therapeutic drug level monitoring: Secondary | ICD-10-CM | POA: Diagnosis not present

## 2022-03-17 DIAGNOSIS — I10 Essential (primary) hypertension: Secondary | ICD-10-CM

## 2022-03-17 MED ORDER — ROSUVASTATIN CALCIUM 20 MG PO TABS: 20.0000 mg | ORAL_TABLET | Freq: Every day | ORAL | 3 refills | Status: DC

## 2022-03-17 NOTE — Patient Instructions (Signed)
Medication Instructions:  INCREASE ROSUVASTATIN TO 20 MG DAILY   *If you need a refill on your cardiac medications before your next appointment, please call your pharmacy*  Lab Work: FASTING LP/CMET IN 3 MONTHS   If you have labs (blood work) drawn today and your tests are completely normal, you will receive your results only by: [place] (if you have MyChart) OR A paper copy in the mail If you have any lab test that is abnormal or we need to change your treatment, we will call you to review the results.  Testing/Procedures: NONE  Follow-Up: At [facility], you and your health needs are our priority.  As part of our continuing mission to provide you with exceptional heart care, we have created designated Provider Care Teams.  These Care Teams include your primary Cardiologist (physician) and Advanced Practice Providers (APPs -  Physician Assistants and Nurse Practitioners) who all work together to provide you with the care you need, when you need it.  We recommend signing up for the patient portal called "MyChart".  Sign up information is provided on this After Visit Summary.  MyChart is used to connect with patients for Virtual Visits (Telemedicine).  Patients are able to view lab/test results, encounter notes, upcoming appointments, etc.  Non-urgent messages can be sent to your provider as well.   To learn more about what you can do with MyChart, go to [email or web address].    Your next appointment:   12 month(s)  Provider:   [name], MD

## 2022-03-17 NOTE — Assessment & Plan Note (Addendum)
BP has been generally controlled.  It was initially elevated but better on repeat.  Yesterday it was 101/70.  It is typically 120-130/75.  Continue carvedilol, amlodipine, and losartan.

## 2022-03-17 NOTE — Assessment & Plan Note (Signed)
Patient has aortic atherosclerosis.  LDL goal is less than 70.  Recommend increasing rosuvastatin to 20 mg.  Repeat fasting lipids and a CMP in 3 months.

## 2022-05-13 ENCOUNTER — Other Ambulatory Visit (HOSPITAL_BASED_OUTPATIENT_CLINIC_OR_DEPARTMENT_OTHER): Payer: Self-pay | Admitting: Cardiovascular Disease

## 2022-05-13 NOTE — Telephone Encounter (Signed)
Rx request sent to pharmacy.  

## 2022-05-17 ENCOUNTER — Other Ambulatory Visit: Payer: Self-pay | Admitting: Neurosurgery

## 2022-06-04 ENCOUNTER — Ambulatory Visit (HOSPITAL_COMMUNITY): Admit: 2022-06-04 | Payer: No Typology Code available for payment source | Admitting: Neurosurgery

## 2022-06-04 SURGERY — LUMBAR LAMINECTOMY/DECOMPRESSION MICRODISCECTOMY 1 LEVEL
Anesthesia: General | Site: Back | Laterality: Bilateral

## 2022-06-14 ENCOUNTER — Telehealth (HOSPITAL_BASED_OUTPATIENT_CLINIC_OR_DEPARTMENT_OTHER): Payer: Self-pay | Admitting: Cardiovascular Disease

## 2022-06-14 NOTE — Telephone Encounter (Signed)
Pt c/o medication issue:  1. Name of Medication: amLODipine (NORVASC) 10 MG tablet   2. How are you currently taking this medication (dosage and times per day)? 5 mg tablet daily   3. Are you having a reaction (difficulty breathing--STAT)? No   4. What is your medication issue? Patient's daughter is calling stating the patient's PCP decreased this medication to 5 mg's in March. She is wanting to confirm if the patient still needs the labs ordered by Dr. Duke Salvia due to this. Please advise.

## 2022-06-14 NOTE — Telephone Encounter (Signed)
Returned call to Deanna [name] and informed her that labs are still needed. She verbalizes understanding.

## 2022-08-03 ENCOUNTER — Other Ambulatory Visit: Payer: Self-pay | Admitting: Internal Medicine

## 2022-08-03 ENCOUNTER — Ambulatory Visit
Admission: RE | Admit: 2022-08-03 | Discharge: 2022-08-03 | Disposition: A | Discharge status: Home / Self Care | Payer: Medicare Other | Source: Ambulatory Visit | Attending: Internal Medicine | Admitting: Internal Medicine

## 2022-08-03 DIAGNOSIS — M25552 Pain in left hip: Secondary | ICD-10-CM

## 2023-01-21 ENCOUNTER — Other Ambulatory Visit: Payer: Self-pay | Admitting: Neurosurgery

## 2023-02-14 ENCOUNTER — Other Ambulatory Visit: Payer: Self-pay | Admitting: Neurosurgery

## 2023-03-02 ENCOUNTER — Other Ambulatory Visit (HOSPITAL_COMMUNITY): Payer: Medicare Other

## 2023-03-02 ENCOUNTER — Telehealth: Payer: Self-pay | Admitting: *Deleted

## 2023-03-02 NOTE — Telephone Encounter (Signed)
S/w the pt's daughter who agrees to appt for her mom for preop clearance. Pt has been scheduled to see Milas Gain, NP 03/03/23 @ 1:55.

## 2023-03-02 NOTE — Telephone Encounter (Signed)
   Name: Deanna [name]  DOB: December 21, 1930  MRN: [id number]  Primary Cardiologist: Chilton Si, MD  Chart reviewed as part of pre-operative protocol coverage. Because of Deanna [name]'s past medical history and time since last visit, she will require a follow-up in-office visit in order to better assess preoperative cardiovascular risk.    Patient is scheduled to see Dr. Duke Salvia on 03/2723 however surgery is scheduled for 03/07/2023.  Pre-op covering staff: - Please schedule appointment and call patient to inform them. If patient already had an upcoming appointment within acceptable timeframe, please add "pre-op clearance" to the appointment notes so provider is aware. - Please contact requesting surgeon's office via preferred method (i.e, phone, fax) to inform them of need for appointment prior to surgery.    Napoleon Form, Leodis Rains, NP  03/02/2023, 5:10 PM

## 2023-03-02 NOTE — Telephone Encounter (Signed)
   [facility] Pre-operative Risk Assessment    Patient Name: Deanna [name]  DOB: November 23, 1930 MRN: [id number]  HEARTCARE STAFF:  - IMPORTANT!!!!!! Under Visit Info/Reason for Call, type in Other and utilize the format Clearance MM/DD/YY or Clearance TBD. Do not use dashes or single digits. - Please review there is not already an duplicate clearance open for this procedure. - If request is for dental extraction, please clarify the # of teeth to be extracted. - If the patient is currently at the dentist's office, call Pre-Op Callback Staff (MA/nurse) to input urgent request.  - If the patient is not currently in the dentist office, please route to the Pre-Op pool.  Request for surgical clearance:  What type of surgery is being performed? L4-5 lumbar laminectomy  When is this surgery scheduled? 03/07/23  What type of clearance is required (medical clearance vs. Pharmacy clearance to hold med vs. Both)? Medical  Are there any medications that need to be held prior to surgery and how long? None listed  Practice name and name of physician performing surgery? [facility], Dr Jordan Likes  What is the office phone number? [phone]   7.   What is the office fax number? [phone]  8.   Anesthesia type (None, local, MAC, general) ? General   Tyler Robidoux L 03/02/2023, 4:58 PM  _________________________________________________________________   (provider comments below)

## 2023-03-02 NOTE — Pre-Procedure Instructions (Signed)
Surgical Instructions   Your procedure is scheduled on March 07, 2023. Report to [facility] Main Entrance "A" at 6:00 A.M., then check in with the Admitting office. Any questions or running late day of surgery: call [phone]  Questions prior to your surgery date: call [phone], Monday-Friday, 8am-4pm. If you experience any cold or flu symptoms such as cough, fever, chills, shortness of breath, etc. between now and your scheduled surgery, please notify us at the above number.     Remember:  Do not eat or drink after midnight the night before your surgery    Take these medicines the morning of surgery with A SIP OF WATER: acetaminophen (TYLENOL)  amLODipine (NORVASC)  buPROPion (WELLBUTRIN XL)  carvedilol (COREG)  cycloSPORINE (RESTASIS) ophthalmic emulsion  escitalopram (LEXAPRO)  esomeprazole (NEXIUM)    May take these medicines IF NEEDED: ALPRAZolam (XANAX)  cyclobenzaprine (FLEXERIL)  loperamide (IMODIUM A-D)  SYMAX-SR  traMADol (ULTRAM)    One week prior to surgery, STOP taking any Aspirin (unless otherwise instructed by your surgeon) Aleve, Naproxen, Ibuprofen, Motrin, Advil, Goody's, BC's, all herbal medications, fish oil, and non-prescription vitamins. This includes your medication: diclofenac Sodium (VOLTAREN) GEL    WHAT DO I DO ABOUT MY DIABETES MEDICATION?      THE MORNING OF SURGERY, take 13 units of insulin glargine (LANTUS).  DO NOT take your bedtime dose of Insulin lispro (HUMALOG JUNIOR KWIKPEN) the night before surgery.  If your CBG is greater than 220 mg/dL, you may take  of your Insulin lispro (HUMALOG JUNIOR KWIKPEN).   HOW TO MANAGE YOUR DIABETES BEFORE AND AFTER SURGERY  Why is it important to control my blood sugar before and after surgery? Improving blood sugar levels before and after surgery helps healing and can limit problems. A way of improving blood sugar control is eating a healthy diet by:  Eating less sugar and  carbohydrates  Increasing activity/exercise  Talking with your doctor about reaching your blood sugar goals High blood sugars (greater than 180 mg/dL) can raise your risk of infections and slow your recovery, so you will need to focus on controlling your diabetes during the weeks before surgery. Make sure that the doctor who takes care of your diabetes knows about your planned surgery including the date and location.  How do I manage my blood sugar before surgery? Check your blood sugar at least 4 times a day, starting 2 days before surgery, to make sure that the level is not too high or low.  Check your blood sugar the morning of your surgery when you wake up and every 2 hours until you get to the Short Stay unit.  If your blood sugar is less than 70 mg/dL, you will need to treat for low blood sugar: Do not take insulin. Treat a low blood sugar (less than 70 mg/dL) with  cup of clear juice (cranberry or apple), 4 glucose tablets, OR glucose gel. Recheck blood sugar in 15 minutes after treatment (to make sure it is greater than 70 mg/dL). If your blood sugar is not greater than 70 mg/dL on recheck, call [phone] for further instructions. Report your blood sugar to the short stay nurse when you get to Short Stay.  If you are admitted to the hospital after surgery: Your blood sugar will be checked by the staff and you will probably be given insulin after surgery (instead of oral diabetes medicines) to make sure you have good blood sugar levels. The goal for blood sugar control after surgery is  80-180 mg/dL.                      Do NOT Smoke (Tobacco/Vaping) for 24 hours prior to your procedure.  If you use a CPAP at night, you may bring your mask/headgear for your overnight stay.   You will be asked to remove any contacts, glasses, piercing's, hearing aid's, dentures/partials prior to surgery. Please bring cases for these items if needed.    Patients discharged the day of surgery will  not be allowed to drive home, and someone needs to stay with them for 24 hours.  SURGICAL WAITING ROOM VISITATION Patients may have no more than 2 support people in the waiting area - these visitors may rotate.   Pre-op nurse will coordinate an appropriate time for 1 ADULT support person, who may not rotate, to accompany patient in pre-op.  Children under the age of 93 must have an adult with them who is not the patient and must remain in the main waiting area with an adult.  If the patient needs to stay at the hospital during part of their recovery, the visitor guidelines for inpatient rooms apply.  Please refer to the [facility] website for the visitor guidelines for any additional information.   If you received a COVID test during your pre-op visit  it is requested that you wear a mask when out in public, stay away from anyone that may not be feeling well and notify your surgeon if you develop symptoms. If you have been in contact with anyone that has tested positive in the last 10 days please notify you surgeon.      Pre-operative 5 CHG Bathing Instructions   You can play a key role in reducing the risk of infection after surgery. Your skin needs to be as free of germs as possible. You can reduce the number of germs on your skin by washing with CHG (chlorhexidine gluconate) soap before surgery. CHG is an antiseptic soap that kills germs and continues to kill germs even after washing.   DO NOT use if you have an allergy to chlorhexidine/CHG or antibacterial soaps. If your skin becomes reddened or irritated, stop using the CHG and notify one of our RNs at [phone].   Please shower with the CHG soap starting 4 days before surgery using the following schedule:     Please keep in mind the following:  DO NOT shave, including legs and underarms, starting the day of your first shower.   You may shave your face at any point before/day of surgery.  Place clean sheets on your bed the day  you start using CHG soap. Use a clean washcloth (not used since being washed) for each shower. DO NOT sleep with pets once you start using the CHG.   CHG Shower Instructions:  Wash your face and private area with normal soap. If you choose to wash your hair, wash first with your normal shampoo.  After you use shampoo/soap, rinse your hair and body thoroughly to remove shampoo/soap residue.  Turn the water OFF and apply about 3 tablespoons (45 ml) of CHG soap to a CLEAN washcloth.  Apply CHG soap ONLY FROM YOUR NECK DOWN TO YOUR TOES (washing for 3-5 minutes)  DO NOT use CHG soap on face, private areas, open wounds, or sores.  Pay special attention to the area where your surgery is being performed.  If you are having back surgery, having someone wash your back for you may  be helpful. Wait 2 minutes after CHG soap is applied, then you may rinse off the CHG soap.  Pat dry with a clean towel  Put on clean clothes/pajamas   If you choose to wear lotion, please use ONLY the CHG-compatible lotions that are listed below.  Additional instructions for the day of surgery: DO NOT APPLY any lotions, deodorants, cologne, or perfumes.   Do not bring valuables to the hospital. [facility] is not responsible for any belongings/valuables. Do not wear nail polish, gel polish, artificial nails, or any other type of covering on natural nails (fingers and toes) Do not wear jewelry or makeup Put on clean/comfortable clothes.  Please brush your teeth.  Ask your nurse before applying any prescription medications to the skin.     CHG Compatible Lotions   Aveeno Moisturizing lotion  Cetaphil Moisturizing Cream  Cetaphil Moisturizing Lotion  Clairol Herbal Essence Moisturizing Lotion, Dry Skin  Clairol Herbal Essence Moisturizing Lotion, Extra Dry Skin  Clairol Herbal Essence Moisturizing Lotion, Normal Skin  Curel Age Defying Therapeutic Moisturizing Lotion with Alpha Hydroxy  Curel Extreme Care Body  Lotion  Curel Soothing Hands Moisturizing Hand Lotion  Curel Therapeutic Moisturizing Cream, Fragrance-Free  Curel Therapeutic Moisturizing Lotion, Fragrance-Free  Curel Therapeutic Moisturizing Lotion, Original Formula  Eucerin Daily Replenishing Lotion  Eucerin Dry Skin Therapy Plus Alpha Hydroxy Crme  Eucerin Dry Skin Therapy Plus Alpha Hydroxy Lotion  Eucerin Original Crme  Eucerin Original Lotion  Eucerin Plus Crme Eucerin Plus Lotion  Eucerin TriLipid Replenishing Lotion  Keri Anti-Bacterial Hand Lotion  Keri Deep Conditioning Original Lotion Dry Skin Formula Softly Scented  Keri Deep Conditioning Original Lotion, Fragrance Free Sensitive Skin Formula  Keri Lotion Fast Absorbing Fragrance Free Sensitive Skin Formula  Keri Lotion Fast Absorbing Softly Scented Dry Skin Formula  Keri Original Lotion  Keri Skin Renewal Lotion Keri Silky Smooth Lotion  Keri Silky Smooth Sensitive Skin Lotion  Nivea Body Creamy Conditioning Oil  Nivea Body Extra Enriched Lotion  Nivea Body Original Lotion  Nivea Body Sheer Moisturizing Lotion Nivea Crme  Nivea Skin Firming Lotion  NutraDerm 30 Skin Lotion  NutraDerm Skin Lotion  NutraDerm Therapeutic Skin Cream  NutraDerm Therapeutic Skin Lotion  ProShield Protective Hand Cream  Provon moisturizing lotion  Please read over the following fact sheets that you were given.

## 2023-03-03 ENCOUNTER — Ambulatory Visit (HOSPITAL_BASED_OUTPATIENT_CLINIC_OR_DEPARTMENT_OTHER): Payer: Medicare Other | Admitting: Family

## 2023-03-03 VITALS — BP 142/72 | HR 63 | Ht 62.0 in | Wt 101.6 lb

## 2023-03-03 DIAGNOSIS — R079 Chest pain, unspecified: Secondary | ICD-10-CM | POA: Diagnosis not present

## 2023-03-03 DIAGNOSIS — R0602 Shortness of breath: Secondary | ICD-10-CM

## 2023-03-03 DIAGNOSIS — I10 Essential (primary) hypertension: Secondary | ICD-10-CM

## 2023-03-03 DIAGNOSIS — Z01818 Encounter for other preprocedural examination: Secondary | ICD-10-CM | POA: Diagnosis not present

## 2023-03-03 DIAGNOSIS — I480 Paroxysmal atrial fibrillation: Secondary | ICD-10-CM

## 2023-03-03 NOTE — Patient Instructions (Signed)
Medication Instructions:  Your physician recommends that you continue on your current medications as directed. Please refer to the Current Medication list given to you today.  Follow-Up: Follow up with Dr. Duke Salvia in 1 year ([facility])

## 2023-03-03 NOTE — Progress Notes (Signed)
Cardiology Office Note:  .   Deanna:  03/06/2023  ID:  Deanna Deanna, DOB 1930/03/24, MRN [id number] PCP: Cleatis Polka., MD  [facility] Providers Cardiologist:  Chilton Si, MD    History of Present Illness: .   Deanna Deanna is a 89 y.o. female with hx of PAF, LBBB, DM2, aoritc atherosclerosis, malignant carcinoid tumor, PAH, HTN.   Diagnosis of atrial fibrillation 07/2019 in setting of sepsis and colitis. Echo 07/2019 LVEF 60-65%, moderate LVH, indeterminate diastolic function. Anticoagulation previously deferred due to GI bleed and carcinoid tumor. She struggled with intravascular volume depletion, hydrochlorothiazide stopped. Myovie w8/2023 low risk, LVEF 72%. Amlodipine increased previously and Losartan added.   Last seen 03/2022 with BP overall controlled on Carvedilol, Amlodipine, Losartan.   Presents today for follow up. Staying hydrated by limiting coffee to morning then drinking water. BP at home most often in the  140s then if she sits and rests it gets better. Reports no shortness of breath nor dyspnea on exertion. Reports no chest pain, pressure, or tightness. No edema, orthopnea, PND. Reports no palpitations.    ROS: Please see the history of present illness.    All other systems reviewed and are negative.   Studies Reviewed: Marland Kitchen   EKG Interpretation Deanna/Time:  Thursday March 03 2023 14:05:27 EST Ventricular Rate:  63 PR Interval:  178 QRS Duration:  120 QT Interval:  460 QTC Calculation: 470 R Axis:   27  Text Interpretation: Normal sinus rhythm Left bundle branch block No acute changes Confirmed by Gillian Shields ([id number]) on 03/03/2023 2:27:06 PM    Cardiac Studies & Procedures     STRESS TESTS  MYOCARDIAL PERFUSION IMAGING 10/07/2021  Narrative   Lexiscan stress is electrically nondiagnostic for ischemia.   Myoview scan shows normal perfusion.  No ischemia or scar   LVEF calculated at 72% with normal wall motion   Prior study not  available for comparison.   Low risk study.  ECHOCARDIOGRAM  ECHOCARDIOGRAM COMPLETE 07/31/2019  Narrative ECHOCARDIOGRAM REPORT    Patient Name:   Deanna Deanna: 07/31/2019 Medical Rec #:  [id number]         Height:       62.0 in Accession #:    [id number]        Weight:       104.6 lb Deanna Deanna:  [birth year]         BSA:          1.451 m Patient Age:    89 years          BP:           128/78 mmHg Patient Gender: F                 HR:           103 bpm. [name] Location:  Inpatient  Procedure: 2D Echo, Cardiac Doppler and Color Doppler  Indications:    Atrial fibrillation  History:        Patient has prior history of Echocardiogram examinations, most recent 09/29/2016. Arrythmias:Atrial Fibrillation; Risk Factors:Diabetes and Hypertension. Sepsis, Breast cancer.  Sonographer:    Lavenia Atlas Referring Phys: [phone] A CALDWELL POWELL JR  IMPRESSIONS   1. Left ventricular ejection fraction, by estimation, is 60 to 65%. The left ventricle has normal function. The left ventricle has no regional wall motion abnormalities. There is moderate concentric left ventricular hypertrophy. Left ventricular diastolic parameters are indeterminate. Elevated left  ventricular end-diastolic pressure. 2. Right ventricular systolic function is normal. The right ventricular size is normal. There is mildly elevated pulmonary artery systolic pressure. The estimated right ventricular systolic pressure is 40.9 mmHg. 3. The mitral valve is normal in structure. Mild mitral valve regurgitation. No evidence of mitral stenosis. 4. Tricuspid valve regurgitation is moderate. 5. The aortic valve is tricuspid. Aortic valve regurgitation is not visualized. Mild to moderate aortic valve sclerosis/calcification is present, without any evidence of aortic stenosis. 6. The inferior vena cava is normal in size with greater than 50% respiratory variability, suggesting right atrial pressure of 3  mmHg.  FINDINGS Left Ventricle: Left ventricular ejection fraction, by estimation, is 60 to 65%. The left ventricle has normal function. The left ventricle has no regional wall motion abnormalities. The left ventricular internal cavity size was normal in size. There is moderate concentric left ventricular hypertrophy. Left ventricular diastolic parameters are indeterminate. Elevated left ventricular end-diastolic pressure.  Right Ventricle: The right ventricular size is normal. No increase in right ventricular wall thickness. Right ventricular systolic function is normal. There is mildly elevated pulmonary artery systolic pressure. The tricuspid regurgitant velocity is 3.08 m/s, and with an assumed right atrial pressure of 3 mmHg, the estimated right ventricular systolic pressure is 40.9 mmHg.  Left Atrium: Left atrial size was normal in size.  Right Atrium: Right atrial size was normal in size.  Pericardium: There is no evidence of pericardial effusion.  Mitral Valve: The mitral valve is normal in structure. There is mild calcification of the mitral valve leaflet(s). Normal mobility of the mitral valve leaflets. Mild to moderate mitral annular calcification. Mild mitral valve regurgitation. No evidence of mitral valve stenosis.  Tricuspid Valve: The tricuspid valve is normal in structure. Tricuspid valve regurgitation is moderate . No evidence of tricuspid stenosis.  Aortic Valve: The aortic valve is tricuspid. Aortic valve regurgitation is not visualized. Mild to moderate aortic valve sclerosis/calcification is present, without any evidence of aortic stenosis.  Pulmonic Valve: The pulmonic valve was normal in structure. Pulmonic valve regurgitation is not visualized. No evidence of pulmonic stenosis.  Aorta: The aortic root is normal in size and structure.  Venous: The inferior vena cava is normal in size with greater than 50% respiratory variability, suggesting right atrial pressure of 3  mmHg.  IAS/Shunts: No atrial level shunt detected by color flow Doppler.   LEFT VENTRICLE PLAX 2D LVIDd:         2.70 cm  Diastology LVIDs:         1.90 cm  LV e' lateral:   4.64 cm/s LV PW:         1.30 cm  LV E/e' lateral: 18.2 LV IVS:        1.30 cm  LV e' medial:    3.77 cm/s LVOT diam:     1.70 cm  LV E/e' medial:  22.4 LV SV:         41 LV SV Index:   28 LVOT Area:     2.27 cm   RIGHT VENTRICLE RV Basal diam:  2.90 cm RV S prime:     10.60 cm/s TAPSE (M-mode): 1.9 cm  LEFT ATRIUM             Index       RIGHT ATRIUM           Index LA diam:        4.10 cm 2.82 cm/m  RA Area:     12.40 cm LA  Vol [facility]):   34.1 ml 23.49 ml/m RA Volume:   29.40 ml  20.26 ml/m LA Vol (A4C):   60.4 ml 41.62 ml/m LA Biplane Vol: 49.6 ml 34.17 ml/m AORTIC VALVE LVOT Vmax:   95.30 cm/s LVOT Vmean:  71.600 cm/s LVOT VTI:    0.180 m  AORTA Ao Root diam: 2.50 cm  MITRAL VALVE               TRICUSPID VALVE MV Area (PHT): 5.54 cm    TR Peak grad:   37.9 mmHg MV Decel Time: 137 msec    TR Vmax:        308.00 cm/s MV E velocity: 84.40 cm/s SHUNTS Systemic VTI:  0.18 m Systemic Diam: 1.70 cm  Armanda Magic MD Electronically signed by Armanda Magic MD Signature Deanna/Time: 07/31/2019/1:36:53 PM    Final             Risk Assessment/Calculations:    CHA2DS2-VASc Score = 6   This indicates a 9.7% annual risk of stroke. The patient's score is based upon: CHF History: 0 HTN History: 1 Diabetes History: 1 Stroke History: 0 Vascular Disease History: 1 (aortic atherosclerosis) Age Score: 2 Gender Score: 1           Physical [name]:   VS:  BP (!) 142/72 (BP Location: Right Arm)   Pulse 63   Ht 5\' 2"  (1.575 m)   Wt 101 lb 9.6 oz (46.1 kg)   SpO2 96%   BMI 18.58 kg/m    Wt Readings from Last 3 Encounters:  03/04/23 103 lb (46.7 kg)  03/03/23 101 lb 9.6 oz (46.1 kg)  03/17/22 101 lb 8 oz (46 kg)    GEN: Well nourished, well developed in no acute distress NECK: No JVD; No  carotid bruits CARDIAC: RRR, no murmurs, rubs, gallops RESPIRATORY:  Clear to auscultation without rales, wheezing or rhonchi  ABDOMEN: Soft, non-tender, non-distended EXTREMITIES:  No edema; No deformity   ASSESSMENT AND PLAN: .    Preop clearance - Pending back surgery which she is hoping will improve her mobility.  According to the Revised Cardiac Risk Index (RCRI), her Perioperative Risk of Major Cardiac Event is (%): 0.9. Her Functional Capacity in METs is: 4.64 according to the [facility] Activity Status Index (DASI). Per AHA/ACC guidelines, she is deemed acceptable risk for the planned procedure without additional cardiovascular testing. Will route to surgical team so they are aware.   HTN - BP reasonably controlled at home with readings initially 140s then lower after sitting and resting. BP goal <140/90 based on age and history of frequent falls. Continue Amlodipine, Losartan, Carvedilol. Discussed to monitor BP at home at least 2 hours after medications and sitting for 5-10 minutes.  HLD / Aortic atherosclerosis - Continue Zetia, Rosuvastatin.  PAF - NSR by EKG. No OAC due to cancer and GI bleed history. Continue Carvedilol 25mg  BID.        Dispo: follow up in 1 year  Signed, Alver Sorrow, NP

## 2023-03-04 ENCOUNTER — Encounter (HOSPITAL_COMMUNITY): Payer: Self-pay

## 2023-03-04 ENCOUNTER — Other Ambulatory Visit: Payer: Self-pay

## 2023-03-04 ENCOUNTER — Encounter (HOSPITAL_COMMUNITY)
Admission: RE | Admit: 2023-03-04 | Discharge: 2023-03-04 | Disposition: A | Discharge status: Home / Self Care | Payer: Medicare Other | Source: Ambulatory Visit | Attending: Neurosurgery

## 2023-03-04 VITALS — BP 136/60 | HR 60 | Temp 98.2°F | Resp 16 | Ht 62.0 in | Wt 103.0 lb

## 2023-03-04 DIAGNOSIS — Z794 Long term (current) use of insulin: Secondary | ICD-10-CM | POA: Diagnosis not present

## 2023-03-04 DIAGNOSIS — I272 Pulmonary hypertension, unspecified: Secondary | ICD-10-CM | POA: Diagnosis not present

## 2023-03-04 DIAGNOSIS — Z853 Personal history of malignant neoplasm of breast: Secondary | ICD-10-CM | POA: Diagnosis not present

## 2023-03-04 DIAGNOSIS — Z01812 Encounter for preprocedural laboratory examination: Secondary | ICD-10-CM | POA: Insufficient documentation

## 2023-03-04 DIAGNOSIS — Z9181 History of falling: Secondary | ICD-10-CM | POA: Insufficient documentation

## 2023-03-04 DIAGNOSIS — I480 Paroxysmal atrial fibrillation: Secondary | ICD-10-CM | POA: Insufficient documentation

## 2023-03-04 DIAGNOSIS — K219 Gastro-esophageal reflux disease without esophagitis: Secondary | ICD-10-CM | POA: Insufficient documentation

## 2023-03-04 DIAGNOSIS — I447 Left bundle-branch block, unspecified: Secondary | ICD-10-CM | POA: Diagnosis not present

## 2023-03-04 DIAGNOSIS — E7800 Pure hypercholesterolemia, unspecified: Secondary | ICD-10-CM | POA: Insufficient documentation

## 2023-03-04 DIAGNOSIS — M48061 Spinal stenosis, lumbar region without neurogenic claudication: Secondary | ICD-10-CM | POA: Insufficient documentation

## 2023-03-04 DIAGNOSIS — I129 Hypertensive chronic kidney disease with stage 1 through stage 4 chronic kidney disease, or unspecified chronic kidney disease: Secondary | ICD-10-CM | POA: Diagnosis not present

## 2023-03-04 DIAGNOSIS — Z9012 Acquired absence of left breast and nipple: Secondary | ICD-10-CM | POA: Diagnosis not present

## 2023-03-04 DIAGNOSIS — E1122 Type 2 diabetes mellitus with diabetic chronic kidney disease: Secondary | ICD-10-CM | POA: Insufficient documentation

## 2023-03-04 DIAGNOSIS — Z9221 Personal history of antineoplastic chemotherapy: Secondary | ICD-10-CM | POA: Insufficient documentation

## 2023-03-04 DIAGNOSIS — N183 Chronic kidney disease, stage 3 unspecified: Secondary | ICD-10-CM | POA: Insufficient documentation

## 2023-03-04 DIAGNOSIS — D484 Neoplasm of uncertain behavior of peritoneum: Secondary | ICD-10-CM | POA: Insufficient documentation

## 2023-03-04 DIAGNOSIS — Z01818 Encounter for other preprocedural examination: Secondary | ICD-10-CM

## 2023-03-04 HISTORY — DX: Gastro-esophageal reflux disease without esophagitis: K21.9

## 2023-03-04 HISTORY — DX: Chronic kidney disease, unspecified: N18.9

## 2023-03-04 LAB — BASIC METABOLIC PANEL
Anion gap: 11 (ref 5–15)
BUN: 31 mg/dL — ABNORMAL HIGH (ref 8–23)
CO2: 21 mmol/L — ABNORMAL LOW (ref 22–32)
Calcium: 10.2 mg/dL (ref 8.9–10.3)
Chloride: 105 mmol/L (ref 98–111)
Creatinine, Ser: 1.27 mg/dL — ABNORMAL HIGH (ref 0.44–1.00)
GFR, Estimated: 40 mL/min — ABNORMAL LOW (ref >60)
Glucose, Bld: 263 mg/dL — ABNORMAL HIGH (ref 70–99)
Potassium: 4.6 mmol/L (ref 3.5–5.1)
Sodium: 137 mmol/L (ref 135–145)

## 2023-03-04 LAB — CBC
HCT: 37.6 % (ref 36.0–46.0)
Hemoglobin: 11.7 g/dL — ABNORMAL LOW (ref 12.0–15.0)
MCH: 27.8 pg (ref 26.0–34.0)
MCHC: 31.1 g/dL (ref 30.0–36.0)
MCV: 89.3 fL (ref 80.0–100.0)
Platelets: 192 10*3/uL (ref 150–400)
RBC: 4.21 MIL/uL (ref 3.87–5.11)
RDW: 14.5 % (ref 11.5–15.5)
WBC: 11.6 10*3/uL — ABNORMAL HIGH (ref 4.0–10.5)
nRBC: 0 % (ref 0.0–0.2)

## 2023-03-04 LAB — SURGICAL PCR SCREEN
MRSA, PCR: POSITIVE — AB
Staphylococcus aureus: POSITIVE — AB

## 2023-03-04 LAB — GLUCOSE, CAPILLARY: Glucose-Capillary: 249 mg/dL — ABNORMAL HIGH (ref 70–99)

## 2023-03-04 NOTE — Progress Notes (Signed)
Anesthesia Chart Review:  Case: [id number] Date/Time: 03/07/23 0745   Procedure: Laminectomy and Foraminotomy - bilateral - L4-L5 (Bilateral: Back) - 3C   Anesthesia type: General   Pre-op diagnosis: Stenosis   [facility]: [facility] ROOM [facility] / [facility]   Surgeons: Julio Sicks, MD       DISCUSSION: Patient is a 89 year old female scheduled for the above procedure. Asked to review after 5:00 PM on 03/04/23.  History includes never smoker, HTN, PAF (diagnosed 07/30/19 in setting of sepsis/colitis; off anticoag due to fall risk), LBBB, pulmonary hypertension (mild, DM2, GERD, hypercholesterolemia, CKD (stage 3), IDA, left breast cancer (s/p left mastectomy 1998, chemotherapy), mesenteric mass (08/2019, suspected carcinoid by Chromogranin A levels, she declined Somatuline or somastatin), spinal surgery (C4-7 ACDF 04/15/06;).   She had preoperative cardiology evaluation on 03/03/23 by Gillian Shields, NP. Office note is still pended, but no new orders with one year follow-up planned. Non-ischemic stress test in 09/2021. Echo in 07/2019 showed LVEF 60-65%, no RWMA, moderate concentric LVH, normal RV systolic function, mildly elevated PASP, RVSP 40.9 mmHg, mild MR, moderate TR, mild-moderate AV sclerosis without evidence of AS.  A1c 7.6% on 02/23/23 (PCP office). She is on Lantus 36 units Q AM and Humalog 4 units Q HS if glucose > 200.  Anesthesia team to evaluate on the day of surgery.    VS: BP 136/60   Pulse 60   Temp 36.8 C   Resp 16   Ht 5\' 2"  (1.575 m)   Wt 46.7 kg   SpO2 96%   BMI 18.84 kg/m    PROVIDERS: Cleatis Polka., MD is PCP  Chilton Si, MD is cardiologist Arlan Organ, MD is HEM-ONC   LABS: Preoperative labs noted. A1c 7.6 on 02/23/2023.   (all labs ordered are listed, but only abnormal results are displayed)  Labs Reviewed  SURGICAL PCR SCREEN - Abnormal; Notable for the following components:      Result Value   MRSA, PCR POSITIVE (*)    Staphylococcus aureus POSITIVE (*)     All other components within normal limits  GLUCOSE, CAPILLARY - Abnormal; Notable for the following components:   Glucose-Capillary 249 (*)    All other components within normal limits  BASIC METABOLIC PANEL - Abnormal; Notable for the following components:   CO2 21 (*)    Glucose, Bld 263 (*)    BUN 31 (*)    Creatinine, Ser 1.27 (*)    GFR, Estimated 40 (*)    All other components within normal limits  CBC - Abnormal; Notable for the following components:   WBC 11.6 (*)    Hemoglobin 11.7 (*)    All other components within normal limits     IMAGES: CT pelvis 08/05/21: IMPRESSION: 1. Unchanged, heterogeneously calcified mass in the central small bowel mesentery in the low central abdomen, in keeping with treated carcinoid metastasis 2. No evidence of lymphadenopathy or other metastatic disease in the pelvis. - Aortic Atherosclerosis (ICD10-I70.0).   EKG: EKG 03/03/23: Normal sinus rhythm Left bundle branch block No acute changes Confirmed by Gillian Shields ([id number]) on 03/03/2023 2:27:06 PM   CV: Nuclear stress test 10/07/21:   Eugenie Birks stress is electrically nondiagnostic for ischemia.   Myoview scan shows normal perfusion.  No ischemia or scar   LVEF calculated at 72% with normal wall motion   Prior study not available for comparison.   Low risk study.   Echo 07/31/19: IMPRESSIONS   1. Left ventricular ejection fraction,  by estimation, is 60 to 65%. The  left ventricle has normal function. The left ventricle has no regional  wall motion abnormalities. There is moderate concentric left ventricular  hypertrophy. Left ventricular  diastolic parameters are indeterminate. Elevated left ventricular  end-diastolic pressure.   2. Right ventricular systolic function is normal. The right ventricular  size is normal. There is mildly elevated pulmonary artery systolic  pressure. The estimated right ventricular systolic pressure is 40.9 mmHg.   3. The mitral valve is  normal in structure. Mild mitral valve  regurgitation. No evidence of mitral stenosis.   4. Tricuspid valve regurgitation is moderate.   5. The aortic valve is tricuspid. Aortic valve regurgitation is not  visualized. Mild to moderate aortic valve sclerosis/calcification is  present, without any evidence of aortic stenosis.   6. The inferior vena cava is normal in size with greater than 50%  respiratory variability, suggesting right atrial pressure of 3 mmHg.    Cardiac cath 02/03/05: CONCLUSION: Normal coronary angiography and left ventricular wall motion.    Past Medical History:  Diagnosis Date   Arthritis    all over   Cancer [facility])    Left Breast   Chronic kidney disease    CKD3   Depression    Diabetes mellitus without complication (HCC)    diet controlled   Dysrhythmia 2021   A.Fib secondary to illness. Resolved.   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   PAF (paroxysmal atrial fibrillation) (HCC) 07/30/2019   Pure hypercholesterolemia 11/26/2020    Past Surgical History:  Procedure Laterality Date   APPENDECTOMY     with lower back surgery   BACK SURGERY     x2   BIOPSY  08/02/2019   Procedure: BIOPSY;  Surgeon: Benancio Deeds, MD;  Location: [facility];  Service: Gastroenterology;;   BREAST SURGERY Left 1995   complete left mastectomy   CERVICAL SPINE SURGERY     COLONOSCOPY WITH PROPOFOL N/A 08/02/2019   Procedure: COLONOSCOPY WITH PROPOFOL;  Surgeon: Benancio Deeds, MD;  Location: [facility];  Service: Gastroenterology;  Laterality: N/A;   EYE SURGERY Bilateral    cataracts   TONSILLECTOMY     at age 48    MEDICATIONS:  acetaminophen (TYLENOL) 325 MG tablet   ALPRAZolam (XANAX) 0.25 MG tablet   amLODipine (NORVASC) 10 MG  tablet   budesonide (ENTOCORT EC) 3 MG 24 hr capsule   buPROPion (WELLBUTRIN XL) 150 MG 24 hr tablet   carvedilol (COREG) 25 MG tablet   Cholecalciferol (VITAMIN D3) 10 MCG (400 UNIT) tablet   Cyanocobalamin (VITAMIN B 12 PO)   cyclobenzaprine (FLEXERIL) 5 MG tablet   cycloSPORINE (RESTASIS) 0.05 % ophthalmic emulsion   denosumab (PROLIA) 60 MG/ML SOLN injection   diclofenac Sodium (VOLTAREN) 1 % GEL   escitalopram (LEXAPRO) 20 MG tablet   esomeprazole (NEXIUM) 40 MG capsule   ezetimibe (ZETIA) 10 MG tablet   feeding supplement, ENSURE ENLIVE, (ENSURE ENLIVE) LIQD   FREESTYLE LITE test strip   insulin glargine (LANTUS) 100 UNIT/ML injection   Insulin lispro (HUMALOG JUNIOR KWIKPEN) 100 UNIT/ML   ketoconazole (NIZORAL) 2 % cream   loperamide (IMODIUM A-D) 2 MG tablet   losartan (COZAAR) 25 MG tablet   Multiple Vitamins-Minerals (PRESERVISION AREDS 2) CAPS   pregabalin (  LYRICA) 75 MG capsule   Probiotic Product (PROBIOTIC-10 PO)   rosuvastatin (CRESTOR) 20 MG tablet   SURE COMFORT PEN NEEDLES 31G X 5 MM MISC   SYMAX-SR 0.375 MG 12 hr tablet   traMADol (ULTRAM) 50 MG tablet   triamcinolone cream (KENALOG) 0.1 %   vitamin E 400 UNIT capsule   No current facility-administered medications for this encounter.    Shonna Chock, PA-C Surgical Short Stay/Anesthesiology [facility] Phone [phone] [facility] Phone [phone] 03/04/2023 6:51 PM

## 2023-03-04 NOTE — Progress Notes (Signed)
Dr. Lindalou Hose OR scheduler made aware of +MRSA and +MSSA on surgical PCR

## 2023-03-04 NOTE — Anesthesia Preprocedure Evaluation (Signed)
Anesthesia Evaluation  Patient identified by MRN, date of birth, ID band Patient awake    Reviewed: Allergy & Precautions, NPO status , Patient's Chart, lab work & pertinent test results, reviewed documented beta blocker date and time   Airway Mallampati: II  TM Distance: >3 FB Neck ROM: Full    Dental  (+) Teeth Intact, Dental Advisory Given   Pulmonary neg pulmonary ROS   Pulmonary exam normal breath sounds clear to auscultation       Cardiovascular hypertension, Pt. on medications and Pt. on home beta blockers (-) angina (-) Past MI Normal cardiovascular exam+ dysrhythmias Atrial Fibrillation  Rhythm:Regular Rate:Normal     Neuro/Psych  PSYCHIATRIC DISORDERS Anxiety Depression     Neuromuscular disease    GI/Hepatic Neg liver ROS,GERD  Medicated and Controlled,,Malignant carcinoid tumor of duodenum   Endo/Other  diabetes, Type 2, Insulin Dependent    Renal/GU Renal InsufficiencyRenal disease     Musculoskeletal  (+) Arthritis ,    Abdominal   Peds  Hematology  (+) Blood dyscrasia, anemia   Anesthesia Other Findings Left breast cancer   Reproductive/Obstetrics                             Anesthesia Physical Anesthesia Plan  ASA: 3  Anesthesia Plan: General   Post-op Pain Management: Tylenol PO (pre-op)*   Induction: Intravenous  PONV Risk Score and Plan: 3 and Dexamethasone and Ondansetron  Airway Management Planned: Oral ETT  Additional Equipment:   Intra-op Plan:   Post-operative Plan: Extubation in OR  Informed Consent: I have reviewed the patients History and Physical, chart, labs and discussed the procedure including the risks, benefits and alternatives for the proposed anesthesia with the patient or authorized representative who has indicated his/her understanding and acceptance.     Dental advisory given  Plan Discussed with: CRNA  Anesthesia Plan Comments:  (PAT note written 03/04/2023 by Shonna Chock, PA-C.  )       Anesthesia Quick Evaluation

## 2023-03-04 NOTE — Progress Notes (Signed)
PCP - Dr. Martha Clan Cardiologist - Dr. Chilton Si - Last office visit 03/03/2023 for cardiac clearance  PPM/ICD - Denies Device Orders - n/a Rep Notified - n/a  Chest x-ray - n/a EKG - 03/03/2023 Stress Test - 10/07/2021 ECHO - 07/31/2019 Cardiac Cath - 02/03/2005  Sleep Study - Denies CPAP - n/a  Pt is DM2. She checks her blood sugar 2x/day with her Libre CGM (currently on her right upper arm). Normal fasting range is around 140. CBG at pre-op 249. Pt had toast with jelly, 2 eggs, milk and coffee with cream for breakfast. Last A1c 7.6 on 02/23/2023. Pt educated on decreasing sugar/carb intake to optimize blood sugars for surgery. Pt and pts daughter understood.  Last dose of GLP1 agonist-  n/a GLP1 instructions: n/a  Blood Thinner Instructions: n/a Aspirin Instructions: n/a  NPO after midnight  COVID TEST- n/a   Anesthesia review: Yes. Cardiac Clearance   Patient denies shortness of breath, fever, cough and chest pain at PAT appointment. Pt denies any respiratory illness/infection in the last two months.    All instructions explained to the patient and patients daughter Eunice Blase, with a verbal understanding of the material. Patient agrees to go over the instructions while at home for a better understanding. Patient also instructed to self quarantine after being tested for COVID-19. The opportunity to ask questions was provided.

## 2023-03-06 ENCOUNTER — Encounter (HOSPITAL_BASED_OUTPATIENT_CLINIC_OR_DEPARTMENT_OTHER): Payer: Self-pay | Admitting: Family

## 2023-03-07 ENCOUNTER — Other Ambulatory Visit: Payer: Self-pay

## 2023-03-07 ENCOUNTER — Ambulatory Visit (HOSPITAL_BASED_OUTPATIENT_CLINIC_OR_DEPARTMENT_OTHER): Payer: Medicare Other | Admitting: Anesthesiology

## 2023-03-07 ENCOUNTER — Encounter (HOSPITAL_COMMUNITY)
Admission: RE | Disposition: A | Discharge status: Home Health | Payer: Self-pay | Source: Ambulatory Visit | Attending: Neurosurgery

## 2023-03-07 ENCOUNTER — Observation Stay (HOSPITAL_COMMUNITY)
Admission: RE | Admit: 2023-03-07 | Discharge: 2023-03-08 | Disposition: A | Discharge status: Home Health | Payer: Medicare Other | Source: Ambulatory Visit | Attending: Neurosurgery | Admitting: Neurosurgery

## 2023-03-07 ENCOUNTER — Encounter (HOSPITAL_COMMUNITY): Payer: Self-pay | Admitting: Neurosurgery

## 2023-03-07 ENCOUNTER — Ambulatory Visit (HOSPITAL_COMMUNITY): Payer: Medicare Other | Admitting: Vascular Surgery

## 2023-03-07 ENCOUNTER — Ambulatory Visit (HOSPITAL_COMMUNITY): Payer: Medicare Other

## 2023-03-07 DIAGNOSIS — I480 Paroxysmal atrial fibrillation: Secondary | ICD-10-CM | POA: Diagnosis not present

## 2023-03-07 DIAGNOSIS — M48062 Spinal stenosis, lumbar region with neurogenic claudication: Principal | ICD-10-CM | POA: Diagnosis present

## 2023-03-07 DIAGNOSIS — Z85068 Personal history of other malignant neoplasm of small intestine: Secondary | ICD-10-CM | POA: Insufficient documentation

## 2023-03-07 DIAGNOSIS — I10 Essential (primary) hypertension: Secondary | ICD-10-CM

## 2023-03-07 DIAGNOSIS — I129 Hypertensive chronic kidney disease with stage 1 through stage 4 chronic kidney disease, or unspecified chronic kidney disease: Secondary | ICD-10-CM | POA: Diagnosis not present

## 2023-03-07 DIAGNOSIS — E1122 Type 2 diabetes mellitus with diabetic chronic kidney disease: Secondary | ICD-10-CM | POA: Diagnosis not present

## 2023-03-07 DIAGNOSIS — M48061 Spinal stenosis, lumbar region without neurogenic claudication: Secondary | ICD-10-CM

## 2023-03-07 DIAGNOSIS — Z79899 Other long term (current) drug therapy: Secondary | ICD-10-CM | POA: Diagnosis not present

## 2023-03-07 DIAGNOSIS — M5416 Radiculopathy, lumbar region: Secondary | ICD-10-CM

## 2023-03-07 DIAGNOSIS — I4891 Unspecified atrial fibrillation: Secondary | ICD-10-CM | POA: Diagnosis not present

## 2023-03-07 DIAGNOSIS — Z794 Long term (current) use of insulin: Secondary | ICD-10-CM | POA: Insufficient documentation

## 2023-03-07 DIAGNOSIS — Z853 Personal history of malignant neoplasm of breast: Secondary | ICD-10-CM | POA: Insufficient documentation

## 2023-03-07 DIAGNOSIS — E119 Type 2 diabetes mellitus without complications: Secondary | ICD-10-CM

## 2023-03-07 DIAGNOSIS — N183 Chronic kidney disease, stage 3 unspecified: Secondary | ICD-10-CM | POA: Insufficient documentation

## 2023-03-07 HISTORY — PX: LUMBAR LAMINECTOMY/DECOMPRESSION MICRODISCECTOMY: SHX5026

## 2023-03-07 LAB — HEMOGLOBIN A1C
Hgb A1c MFr Bld: 8.1 % — ABNORMAL HIGH (ref 4.8–5.6)
Mean Plasma Glucose: 185.77 mg/dL

## 2023-03-07 LAB — GLUCOSE, CAPILLARY
Glucose-Capillary: 154 mg/dL — ABNORMAL HIGH (ref 70–99)
Glucose-Capillary: 159 mg/dL — ABNORMAL HIGH (ref 70–99)
Glucose-Capillary: 161 mg/dL — ABNORMAL HIGH (ref 70–99)
Glucose-Capillary: 153 mg/dL — ABNORMAL HIGH (ref 70–99)
Glucose-Capillary: 168 mg/dL — ABNORMAL HIGH (ref 70–99)
Glucose-Capillary: 118 mg/dL — ABNORMAL HIGH (ref 70–99)

## 2023-03-07 SURGERY — LUMBAR LAMINECTOMY/DECOMPRESSION MICRODISCECTOMY 1 LEVEL
Anesthesia: General | Site: Back | Laterality: Bilateral

## 2023-03-07 MED ORDER — PREGABALIN 75 MG PO CAPS
75.0000 mg | ORAL_CAPSULE | Freq: Two times a day (BID) | ORAL | Status: DC
  Administered 2023-03-07 (×2): 75 mg via ORAL
  Filled 2023-03-07 (×2): qty 1

## 2023-03-07 MED ORDER — CHOLECALCIFEROL 10 MCG (400 UNIT) PO TABS
400.0000 [IU] | ORAL_TABLET | Freq: Every day | ORAL | Status: DC
Start: 2023-03-07 — End: 2023-03-08
  Filled 2023-03-07 (×2): qty 1

## 2023-03-07 MED ORDER — ACETAMINOPHEN 650 MG RE SUPP: 650.0000 mg | RECTAL | Status: DC | PRN

## 2023-03-07 MED ORDER — PHENYLEPHRINE 80 MCG/ML (10ML) SYRINGE FOR IV PUSH (FOR BLOOD PRESSURE SUPPORT)
PREFILLED_SYRINGE | INTRAVENOUS | Status: DC | PRN
  Administered 2023-03-07: 80 ug via INTRAVENOUS

## 2023-03-07 MED ORDER — ROCURONIUM BROMIDE 10 MG/ML (PF) SYRINGE
PREFILLED_SYRINGE | INTRAVENOUS | Status: DC | PRN
  Administered 2023-03-07: 30 mg via INTRAVENOUS

## 2023-03-07 MED ORDER — BUPIVACAINE HCL (PF) 0.25 % IJ SOLN
INTRAMUSCULAR | Status: AC
  Filled 2023-03-07: qty 30

## 2023-03-07 MED ORDER — CHLORHEXIDINE GLUCONATE CLOTH 2 % EX PADS: 6.0000 | MEDICATED_PAD | Freq: Once | CUTANEOUS | Status: DC

## 2023-03-07 MED ORDER — CEFAZOLIN SODIUM-DEXTROSE 2-4 GM/100ML-% IV SOLN
2.0000 g | INTRAVENOUS | Status: AC
  Administered 2023-03-07: 2 g via INTRAVENOUS
  Filled 2023-03-07: qty 100

## 2023-03-07 MED ORDER — KETOROLAC TROMETHAMINE 15 MG/ML IJ SOLN
INTRAMUSCULAR | Status: DC | PRN
  Administered 2023-03-07: 15 mg via INTRAVENOUS

## 2023-03-07 MED ORDER — 0.9 % SODIUM CHLORIDE (POUR BTL) OPTIME
TOPICAL | Status: DC | PRN
  Administered 2023-03-07: 1000 mL

## 2023-03-07 MED ORDER — CEFAZOLIN SODIUM-DEXTROSE 1-4 GM/50ML-% IV SOLN
1.0000 g | Freq: Three times a day (TID) | INTRAVENOUS | Status: AC
  Administered 2023-03-07 (×2): 1 g via INTRAVENOUS
  Filled 2023-03-07 (×2): qty 50

## 2023-03-07 MED ORDER — CARVEDILOL 25 MG PO TABS
25.0000 mg | ORAL_TABLET | Freq: Two times a day (BID) | ORAL | Status: DC
  Administered 2023-03-07 – 2023-03-08 (×2): 25 mg via ORAL
  Filled 2023-03-07 (×2): qty 1

## 2023-03-07 MED ORDER — SODIUM CHLORIDE 0.9% FLUSH
3.0000 mL | Freq: Two times a day (BID) | INTRAVENOUS | Status: DC
  Administered 2023-03-07: 3 mL via INTRAVENOUS

## 2023-03-07 MED ORDER — BUPIVACAINE HCL (PF) 0.25 % IJ SOLN
INTRAMUSCULAR | Status: DC | PRN
  Administered 2023-03-07: 20 mL

## 2023-03-07 MED ORDER — LIDOCAINE 2% (20 MG/ML) 5 ML SYRINGE
INTRAMUSCULAR | Status: DC | PRN
  Administered 2023-03-07: 40 mg via INTRAVENOUS

## 2023-03-07 MED ORDER — EZETIMIBE 10 MG PO TABS: 10.0000 mg | ORAL_TABLET | Freq: Every day | ORAL | Status: DC

## 2023-03-07 MED ORDER — ONDANSETRON HCL 4 MG/2ML IJ SOLN
INTRAMUSCULAR | Status: DC | PRN
  Administered 2023-03-07: 4 mg via INTRAVENOUS

## 2023-03-07 MED ORDER — PHENOL 1.4 % MT LIQD: 1.0000 | OROMUCOSAL | Status: DC | PRN

## 2023-03-07 MED ORDER — VITAMIN E 45 MG (100 UNIT) PO CAPS
400.0000 [IU] | ORAL_CAPSULE | Freq: Every day | ORAL | Status: DC
Start: 2023-03-07 — End: 2023-03-08

## 2023-03-07 MED ORDER — ONDANSETRON HCL 4 MG PO TABS: 4.0000 mg | ORAL_TABLET | Freq: Four times a day (QID) | ORAL | Status: DC | PRN

## 2023-03-07 MED ORDER — LOSARTAN POTASSIUM 25 MG PO TABS
25.0000 mg | ORAL_TABLET | Freq: Every day | ORAL | Status: DC
  Administered 2023-03-07: 25 mg via ORAL
  Filled 2023-03-07: qty 1

## 2023-03-07 MED ORDER — THROMBIN 20000 UNITS EX SOLR
CUTANEOUS | Status: AC
  Filled 2023-03-07: qty 20000

## 2023-03-07 MED ORDER — HYDROMORPHONE HCL 1 MG/ML IJ SOLN: 1.0000 mg | INTRAMUSCULAR | Status: DC | PRN

## 2023-03-07 MED ORDER — KETOROLAC TROMETHAMINE 30 MG/ML IJ SOLN
INTRAMUSCULAR | Status: AC
  Filled 2023-03-07: qty 1

## 2023-03-07 MED ORDER — INSULIN GLARGINE-YFGN 100 UNIT/ML ~~LOC~~ SOLN
36.0000 [IU] | Freq: Every day | SUBCUTANEOUS | Status: DC
  Filled 2023-03-07: qty 0.36

## 2023-03-07 MED ORDER — SODIUM CHLORIDE 0.9 % IV SOLN
250.0000 mL | INTRAVENOUS | Status: AC
  Administered 2023-03-07: 250 mL via INTRAVENOUS

## 2023-03-07 MED ORDER — HYOSCYAMINE SULFATE ER 0.375 MG PO TB12: 0.3750 mg | ORAL_TABLET | Freq: Two times a day (BID) | ORAL | Status: DC | PRN

## 2023-03-07 MED ORDER — THROMBIN 20000 UNITS EX SOLR
CUTANEOUS | Status: DC | PRN
  Administered 2023-03-07: 20 mL via TOPICAL

## 2023-03-07 MED ORDER — CYANOCOBALAMIN 500 MCG PO TABS
1000.0000 ug | ORAL_TABLET | Freq: Every day | ORAL | Status: DC
  Filled 2023-03-07 (×2): qty 2

## 2023-03-07 MED ORDER — LIDOCAINE 2% (20 MG/ML) 5 ML SYRINGE
INTRAMUSCULAR | Status: AC
  Filled 2023-03-07: qty 5

## 2023-03-07 MED ORDER — CYCLOSPORINE 0.05 % OP EMUL
1.0000 [drp] | Freq: Two times a day (BID) | OPHTHALMIC | Status: DC | PRN
Start: 2023-03-07 — End: 2023-03-08

## 2023-03-07 MED ORDER — INSULIN ASPART 100 UNIT/ML IJ SOLN: 0.0000 [IU] | INTRAMUSCULAR | Status: DC | PRN

## 2023-03-07 MED ORDER — PROPOFOL 10 MG/ML IV BOLUS
INTRAVENOUS | Status: DC | PRN
  Administered 2023-03-07: 70 mg via INTRAVENOUS

## 2023-03-07 MED ORDER — ACETAMINOPHEN 325 MG PO TABS
650.0000 mg | ORAL_TABLET | Freq: Once | ORAL | Status: DC
  Filled 2023-03-07: qty 2

## 2023-03-07 MED ORDER — PHENYLEPHRINE 80 MCG/ML (10ML) SYRINGE FOR IV PUSH (FOR BLOOD PRESSURE SUPPORT)
PREFILLED_SYRINGE | INTRAVENOUS | Status: AC
  Filled 2023-03-07: qty 10

## 2023-03-07 MED ORDER — PROPOFOL 10 MG/ML IV BOLUS
INTRAVENOUS | Status: AC
  Filled 2023-03-07: qty 20

## 2023-03-07 MED ORDER — ACETAMINOPHEN 325 MG PO TABS
650.0000 mg | ORAL_TABLET | ORAL | Status: DC | PRN
  Administered 2023-03-07 – 2023-03-08 (×3): 650 mg via ORAL
  Filled 2023-03-07 (×3): qty 2

## 2023-03-07 MED ORDER — ROSUVASTATIN CALCIUM 20 MG PO TABS
20.0000 mg | ORAL_TABLET | Freq: Every day | ORAL | Status: DC
  Administered 2023-03-07: 20 mg via ORAL
  Filled 2023-03-07: qty 1

## 2023-03-07 MED ORDER — ROCURONIUM BROMIDE 10 MG/ML (PF) SYRINGE
PREFILLED_SYRINGE | INTRAVENOUS | Status: AC
  Filled 2023-03-07: qty 10

## 2023-03-07 MED ORDER — FENTANYL CITRATE (PF) 250 MCG/5ML IJ SOLN
INTRAMUSCULAR | Status: AC
  Filled 2023-03-07: qty 5

## 2023-03-07 MED ORDER — PANTOPRAZOLE SODIUM 40 MG PO TBEC: 40.0000 mg | DELAYED_RELEASE_TABLET | Freq: Every day | ORAL | Status: DC

## 2023-03-07 MED ORDER — ONDANSETRON HCL 4 MG/2ML IJ SOLN
INTRAMUSCULAR | Status: AC
  Filled 2023-03-07: qty 2

## 2023-03-07 MED ORDER — HYDROCODONE-ACETAMINOPHEN 5-325 MG PO TABS
1.0000 | ORAL_TABLET | ORAL | Status: DC | PRN
Start: 2023-03-07 — End: 2023-03-08

## 2023-03-07 MED ORDER — INSULIN ASPART 100 UNIT/ML IJ SOLN
0.0000 [IU] | Freq: Three times a day (TID) | INTRAMUSCULAR | Status: DC
  Administered 2023-03-07 (×2): 2 [IU] via SUBCUTANEOUS

## 2023-03-07 MED ORDER — ESCITALOPRAM OXALATE 20 MG PO TABS: 20.0000 mg | ORAL_TABLET | Freq: Every day | ORAL | Status: DC

## 2023-03-07 MED ORDER — ONDANSETRON HCL 4 MG/2ML IJ SOLN: 4.0000 mg | Freq: Four times a day (QID) | INTRAMUSCULAR | Status: DC | PRN

## 2023-03-07 MED ORDER — SUGAMMADEX SODIUM 200 MG/2ML IV SOLN
INTRAVENOUS | Status: DC | PRN
  Administered 2023-03-07: 100 mg via INTRAVENOUS

## 2023-03-07 MED ORDER — ALPRAZOLAM 0.5 MG PO TABS: 0.2500 mg | ORAL_TABLET | Freq: Every day | ORAL | Status: DC | PRN

## 2023-03-07 MED ORDER — CHLORHEXIDINE GLUCONATE 0.12 % MT SOLN
15.0000 mL | Freq: Once | OROMUCOSAL | Status: AC
  Administered 2023-03-07: 15 mL via OROMUCOSAL
  Filled 2023-03-07: qty 15

## 2023-03-07 MED ORDER — SODIUM CHLORIDE 0.9% FLUSH: 3.0000 mL | INTRAVENOUS | Status: DC | PRN

## 2023-03-07 MED ORDER — LACTATED RINGERS IV SOLN: INTRAVENOUS | Status: DC

## 2023-03-07 MED ORDER — ONDANSETRON HCL 4 MG/2ML IJ SOLN: 4.0000 mg | Freq: Once | INTRAMUSCULAR | Status: DC | PRN

## 2023-03-07 MED ORDER — THROMBIN 5000 UNITS EX SOLR
CUTANEOUS | Status: AC
  Filled 2023-03-07: qty 10000

## 2023-03-07 MED ORDER — BUPROPION HCL ER (XL) 150 MG PO TB24: 150.0000 mg | ORAL_TABLET | Freq: Every day | ORAL | Status: DC

## 2023-03-07 MED ORDER — BUDESONIDE 3 MG PO CPEP
3.0000 mg | ORAL_CAPSULE | Freq: Every day | ORAL | Status: DC
  Filled 2023-03-07: qty 1

## 2023-03-07 MED ORDER — PROSIGHT PO TABS
1.0000 | ORAL_TABLET | Freq: Two times a day (BID) | ORAL | Status: DC
  Administered 2023-03-07 (×2): 1 via ORAL
  Filled 2023-03-07 (×2): qty 1

## 2023-03-07 MED ORDER — CYCLOBENZAPRINE HCL 5 MG PO TABS: 5.0000 mg | ORAL_TABLET | Freq: Three times a day (TID) | ORAL | Status: DC | PRN

## 2023-03-07 MED ORDER — TRAMADOL HCL 50 MG PO TABS
50.0000 mg | ORAL_TABLET | Freq: Four times a day (QID) | ORAL | Status: DC | PRN
  Administered 2023-03-07 (×2): 50 mg via ORAL
  Filled 2023-03-07 (×2): qty 1

## 2023-03-07 MED ORDER — FENTANYL CITRATE (PF) 100 MCG/2ML IJ SOLN: 25.0000 ug | INTRAMUSCULAR | Status: DC | PRN

## 2023-03-07 MED ORDER — MENTHOL 3 MG MT LOZG: 1.0000 | LOZENGE | OROMUCOSAL | Status: DC | PRN

## 2023-03-07 MED ORDER — ARTIFICIAL TEARS OPHTHALMIC OINT
TOPICAL_OINTMENT | OPHTHALMIC | Status: AC
  Filled 2023-03-07: qty 3.5

## 2023-03-07 MED ORDER — FENTANYL CITRATE (PF) 250 MCG/5ML IJ SOLN
INTRAMUSCULAR | Status: DC | PRN
  Administered 2023-03-07: 50 ug via INTRAVENOUS

## 2023-03-07 MED ORDER — ORAL CARE MOUTH RINSE
15.0000 mL | Freq: Once | OROMUCOSAL | Status: AC
Start: 2023-03-07 — End: 2023-03-07

## 2023-03-07 MED ORDER — KETOROLAC TROMETHAMINE 15 MG/ML IJ SOLN: 30.0000 mg | Freq: Four times a day (QID) | INTRAMUSCULAR | Status: DC

## 2023-03-07 MED ORDER — AMLODIPINE BESYLATE 10 MG PO TABS: 10.0000 mg | ORAL_TABLET | Freq: Every day | ORAL | Status: DC

## 2023-03-07 SURGICAL SUPPLY — 41 items
BAG COUNTER SPONGE SURGICOUNT (BAG) ×1 IMPLANT
BENZOIN TINCTURE PRP APPL 2/3 (GAUZE/BANDAGES/DRESSINGS) ×1 IMPLANT
BLADE CLIPPER SURG (BLADE) IMPLANT
BUR CUTTER 7.0 ROUND (BURR) ×1 IMPLANT
CANISTER SUCT 3000ML PPV (MISCELLANEOUS) ×1 IMPLANT
DERMABOND ADVANCED .7 DNX12 (GAUZE/BANDAGES/DRESSINGS) ×1 IMPLANT
DRAPE HALF SHEET 40X57 (DRAPES) IMPLANT
DRAPE LAPAROTOMY 100X72X124 (DRAPES) ×1 IMPLANT
DRAPE MICROSCOPE SLANT 54X150 (MISCELLANEOUS) ×1 IMPLANT
DRAPE SURG 17X23 STRL (DRAPES) ×2 IMPLANT
DRSG OPSITE POSTOP 4X6 (GAUZE/BANDAGES/DRESSINGS) IMPLANT
DURAPREP 26ML APPLICATOR (WOUND CARE) ×1 IMPLANT
ELECT REM PT RETURN 9FT ADLT (ELECTROSURGICAL) ×1
ELECTRODE REM PT RTRN 9FT ADLT (ELECTROSURGICAL) ×1 IMPLANT
GAUZE 4X4 16PLY ~~LOC~~+RFID DBL (SPONGE) IMPLANT
GAUZE SPONGE 4X4 12PLY STRL (GAUZE/BANDAGES/DRESSINGS) ×1 IMPLANT
GLOVE BIO SURGEON STRL SZ 6.5 (GLOVE) ×1 IMPLANT
GLOVE BIOGEL PI IND STRL 6.5 (GLOVE) ×1 IMPLANT
GLOVE ECLIPSE 9.0 STRL (GLOVE) ×1 IMPLANT
GLOVE EXAM NITRILE XL STR (GLOVE) IMPLANT
GOWN STRL REUS W/ TWL LRG LVL3 (GOWN DISPOSABLE) IMPLANT
GOWN STRL REUS W/ TWL XL LVL3 (GOWN DISPOSABLE) ×1 IMPLANT
GOWN STRL REUS W/TWL 2XL LVL3 (GOWN DISPOSABLE) IMPLANT
KIT BASIN OR (CUSTOM PROCEDURE TRAY) ×1 IMPLANT
KIT TURNOVER KIT B (KITS) ×1 IMPLANT
NDL HYPO 22X1.5 SAFETY MO (MISCELLANEOUS) ×1 IMPLANT
NDL SPNL 22GX3.5 QUINCKE BK (NEEDLE) IMPLANT
NEEDLE HYPO 22X1.5 SAFETY MO (MISCELLANEOUS) ×1
NEEDLE SPNL 22GX3.5 QUINCKE BK (NEEDLE)
NS IRRIG 1000ML POUR BTL (IV SOLUTION) ×1 IMPLANT
PACK LAMINECTOMY NEURO (CUSTOM PROCEDURE TRAY) ×1 IMPLANT
PAD ARMBOARD 7.5X6 YLW CONV (MISCELLANEOUS) ×3 IMPLANT
SPIKE FLUID TRANSFER (MISCELLANEOUS) ×1 IMPLANT
SPONGE SURGIFOAM ABS GEL 100 (HEMOSTASIS) IMPLANT
SPONGE SURGIFOAM ABS GEL SZ50 (HEMOSTASIS) ×1 IMPLANT
STRIP CLOSURE SKIN 1/2X4 (GAUZE/BANDAGES/DRESSINGS) ×1 IMPLANT
SUT VIC AB 2-0 CT1 18 (SUTURE) ×1 IMPLANT
SUT VIC AB 3-0 SH 8-18 (SUTURE) ×1 IMPLANT
TOWEL GREEN STERILE (TOWEL DISPOSABLE) ×1 IMPLANT
TOWEL GREEN STERILE FF (TOWEL DISPOSABLE) ×1 IMPLANT
WATER STERILE IRR 1000ML POUR (IV SOLUTION) ×1 IMPLANT

## 2023-03-07 NOTE — Transfer of Care (Signed)
Immediate Anesthesia Transfer of Care Note  Patient: Deanna [name]  Procedure(s) Performed: Laminectomy and Foraminotomy - Bilateral - Lumbar Four-Five (Bilateral: Back)  Patient Location: PACU  Anesthesia Type:General  Level of Consciousness: awake and alert   Airway & Oxygen Therapy: Patient Spontanous Breathing and Patient connected to nasal cannula oxygen  Post-op Assessment: Report given to RN and Post -op Vital signs reviewed and stable  Post vital signs: Reviewed and stable  Last Vitals:  Vitals Value Taken Time  BP 123/64 03/07/23 0945  Temp 36.4 C 03/07/23 0943  Pulse 58 03/07/23 0951  Resp 14 03/07/23 0951  SpO2 94 % 03/07/23 0951  Vitals shown include unfiled device data.  Last Pain:  Vitals:   03/07/23 0655  PainSc: 2       Patients Stated Pain Goal: 3 (03/07/23 4098)  Complications: No notable events documented.

## 2023-03-07 NOTE — Anesthesia Postprocedure Evaluation (Signed)
Anesthesia Post Note  Patient: Deanna [name]  Procedure(s) Performed: Laminectomy and Foraminotomy - Bilateral - Lumbar Four-Five (Bilateral: Back)     Patient location during evaluation: PACU Anesthesia Type: General Level of consciousness: awake and alert Pain management: pain level controlled Vital Signs Assessment: post-procedure vital signs reviewed and stable Respiratory status: spontaneous breathing, nonlabored ventilation and respiratory function stable Cardiovascular status: blood pressure returned to baseline and stable Postop Assessment: no apparent nausea or vomiting Anesthetic complications: no   No notable events documented.  Last Vitals:  Vitals:   03/07/23 1036 03/07/23 1059  BP: (!) 157/65 (!) 146/62  Pulse: (!) 58 (!) 58  Resp: 15 18  Temp: 36.7 C (!) 36.4 C  SpO2: 93% 97%    Last Pain:  Vitals:   03/07/23 1224  PainSc: 5                  Collene Schlichter

## 2023-03-07 NOTE — Anesthesia Procedure Notes (Signed)
Procedure Name: Intubation Date/Time: 03/07/2023 8:18 AM  Performed by: Mayer Camel, CRNAPre-anesthesia Checklist: Patient identified, Emergency Drugs available, Suction available and Patient being monitored Patient Re-evaluated:Patient Re-evaluated prior to induction Oxygen Delivery Method: Circle System Utilized Preoxygenation: Pre-oxygenation with 100% oxygen Induction Type: IV induction Ventilation: Mask ventilation without difficulty Laryngoscope Size: Miller and 2 Grade View: Grade I Tube type: Oral Tube size: 6.5 mm Number of attempts: 1 Airway Equipment and Method: Stylet and Oral airway Placement Confirmation: ETT inserted through vocal cords under direct vision, positive ETCO2 and breath sounds checked- equal and bilateral Secured at: 22 cm Tube secured with: Tape Dental Injury: Teeth and Oropharynx as per pre-operative assessment

## 2023-03-07 NOTE — Plan of Care (Signed)

## 2023-03-07 NOTE — Brief Op Note (Signed)
03/07/2023  9:34 AM  PATIENT:  Deanna [name]  89 y.o. female  PRE-OPERATIVE DIAGNOSIS:  Stenosis  POST-OPERATIVE DIAGNOSIS:  Stenosis  PROCEDURE:  Procedure(s) with comments: Laminectomy and Foraminotomy - Bilateral - Lumbar Four-Five (Bilateral) - 3C  SURGEON:  Surgeons and Role:    Julio Sicks, MD - Primary  PHYSICIAN ASSISTANT:   ASSISTANTSMarland Mcalpine   ANESTHESIA:   general  EBL:  10 mL   BLOOD ADMINISTERED:none  DRAINS: none   LOCAL MEDICATIONS USED:  MARCAINE     SPECIMEN:  No Specimen  DISPOSITION OF SPECIMEN:  N/A  COUNTS:  YES  TOURNIQUET:  * No tourniquets in log *  DICTATION: .Dragon Dictation  PLAN OF CARE: Admit for overnight observation  PATIENT DISPOSITION:  PACU - hemodynamically stable.   Delay start of Pharmacological VTE agent (>24hrs) due to surgical blood loss or risk of bleeding: yes

## 2023-03-07 NOTE — H&P (Signed)
Deanna [name] is an 89 y.o. female.   Chief Complaint: Leg pain HPI: 89 year old female with severe bilateral lower extremity radicular pain left greater than right.  Workup demonstrates evidence of degenerative spondylolisthesis with severe stenosis at L4-5.  Patient has failed conservative management and presents now for decompressive surgery in hopes of improving her symptoms.  Past Medical History:  Diagnosis Date   Arthritis    all over   Cancer [facility])    Left Breast   Chronic kidney disease    CKD3   Depression    Diabetes mellitus without complication (HCC)    diet controlled   Dysrhythmia 2021   A.Fib secondary to illness. Resolved.   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   PAF (paroxysmal atrial fibrillation) (HCC) 07/30/2019   Pure hypercholesterolemia 11/26/2020    Past Surgical History:  Procedure Laterality Date   APPENDECTOMY     with lower back surgery   BACK SURGERY     x2   BIOPSY  08/02/2019   Procedure: BIOPSY;  Surgeon: Benancio Deeds, MD;  Location: [facility];  Service: Gastroenterology;;   BREAST SURGERY Left 1995   complete left mastectomy   CERVICAL SPINE SURGERY     COLONOSCOPY WITH PROPOFOL N/A 08/02/2019   Procedure: COLONOSCOPY WITH PROPOFOL;  Surgeon: Benancio Deeds, MD;  Location: [facility];  Service: Gastroenterology;  Laterality: N/A;   EYE SURGERY Bilateral    cataracts   TONSILLECTOMY     at age 81    Family History  Problem Relation Age of Onset   Hypertension Mother    Heart disease Mother    Hypertension Father    Heart disease Father    Social History:  reports that she has never smoked. She has never used smokeless tobacco. She reports current alcohol use of about 1.0 standard drink of alcohol  per week. She reports that she does not use drugs.  Allergies:  Allergies  Allergen Reactions   Codeine Nausea Only    Medications Prior to Admission  Medication Sig Dispense Refill   acetaminophen (TYLENOL) 325 MG tablet Take 650 mg by mouth 2 (two) times a day.      amLODipine (NORVASC) 10 MG tablet Take 1 tablet (10 mg total) by mouth daily. 90 tablet 3   budesonide (ENTOCORT EC) 3 MG 24 hr capsule daily.     buPROPion (WELLBUTRIN XL) 150 MG 24 hr tablet Take 150 mg by mouth daily.     carvedilol (COREG) 25 MG tablet Take 1 tablet (25 mg total) by mouth 2 (two) times daily with a meal.     Cholecalciferol (VITAMIN D3) 10 MCG (400 UNIT) tablet Take 400 Units by mouth daily.     Cyanocobalamin (VITAMIN B 12 PO) Take 1,000 mcg by mouth daily.     cyclobenzaprine (FLEXERIL) 5 MG tablet Take 5 mg by mouth every 8 (eight) hours as needed for muscle spasms.     cycloSPORINE (RESTASIS) 0.05 % ophthalmic emulsion Place 1 drop into both eyes as needed (dry eyes).     diclofenac Sodium (VOLTAREN) 1 % GEL Apply 2 g topically 4 (four) times daily as needed (painful joints).     escitalopram (LEXAPRO) 20 MG tablet Take 20 mg by mouth daily.     esomeprazole (  NEXIUM) 40 MG capsule Take 40 mg by mouth 2 (two) times daily before a meal.     ezetimibe (ZETIA) 10 MG tablet daily.     feeding supplement, ENSURE ENLIVE, (ENSURE ENLIVE) LIQD Take 237 mLs by mouth 3 (three) times daily between meals. (Patient taking differently: Take 237 mLs by mouth daily.) 237 mL 12   insulin glargine (LANTUS) 100 UNIT/ML injection Inject 0.05 mLs (5 Units total) into the skin daily. (Patient taking differently: Inject 36 Units into the skin in the morning.)     Insulin lispro (HUMALOG JUNIOR KWIKPEN) 100 UNIT/ML Inject 4 Units into the skin See admin instructions. If blood glucose is over 200 at bedtime     ketoconazole (NIZORAL) 2 % cream Apply 1 Application topically daily as needed (Rash).     losartan (COZAAR) 25 MG  tablet TAKE 1 TABLET DAILY 90 tablet 3   Multiple Vitamins-Minerals (PRESERVISION AREDS 2) CAPS Take 1 capsule by mouth 2 (two) times daily.     pregabalin (LYRICA) 75 MG capsule Take 1 capsule (75 mg total) by mouth 2 (two) times daily. 10 capsule 0   Probiotic Product (PROBIOTIC-10 PO) Take 1 capsule by mouth daily. 10 billion cells     rosuvastatin (CRESTOR) 20 MG tablet Take 1 tablet (20 mg total) by mouth at bedtime. 90 tablet 3   traMADol (ULTRAM) 50 MG tablet Take 50 mg by mouth 3 (three) times daily as needed for moderate pain (pain score 4-6).     triamcinolone cream (KENALOG) 0.1 % as needed (rash).     vitamin E 400 UNIT capsule Take 400 Units by mouth 2 (two) times a day.      ALPRAZolam (XANAX) 0.25 MG tablet Take 0.25 mg by mouth daily as needed for anxiety.     denosumab (PROLIA) 60 MG/ML SOLN injection Inject 60 mg into the skin every 6 (six) months. Administer in upper arm, thigh, or abdomen     FREESTYLE LITE test strip      loperamide (IMODIUM A-D) 2 MG tablet Take 2 mg by mouth 4 (four) times daily as needed for diarrhea or loose stools.     SURE COMFORT PEN NEEDLES 31G X 5 MM MISC      SYMAX-SR 0.375 MG 12 hr tablet Take 0.375 mg by mouth every 12 (twelve) hours as needed for pain.      Results for orders placed or performed during the hospital encounter of 03/07/23 (from the past 48 hours)  Glucose, capillary     Status: Abnormal   Collection Time: 03/07/23  6:28 AM  Result Value Ref Range   Glucose-Capillary 154 (H) 70 - 99 mg/dL    Comment: Glucose reference range applies only to samples taken after fasting for at least 8 hours.   Comment 1 Notify RN    No results found.  Pertinent items noted in HPI and remainder of comprehensive ROS otherwise negative.  Blood pressure 120/70, pulse (!) 59, temperature 98.2 F (36.8 C), resp. rate 16, height 5\' 2"  (1.575 m), weight 47.6 kg, SpO2 97%.  Patient is awake and alert.  She is oriented and appropriate.  Speech is  fluent.  Judgment insight are intact.  Cranial nerve function normal [place]heart motor examination reveals slight weakness of dorsiflexion bilaterally left worse than right.  Gait is antalgic.  Posture is mildly flexed peer examination head ears eyes nose and throat is unremarked.  Chest and abdomen are benign.  Extremities are free from injury deformity. Assessment/Plan L4-5  stenosis with bilateral radiculopathy.  Plan bilateral L4-5 decompressive laminotomies and foraminotomies.  Risks and benefits been explained.  Patient wishes to proceed.  Deanna [name] Deanna [name] 03/07/2023, 7:55 AM

## 2023-03-07 NOTE — Op Note (Signed)
Date of procedure: 03/07/2023  Date of dictation: Same  Service: Neurosurgery  Preoperative diagnosis: L4-5 stenosis with bilateral radiculopathy  Postoperative diagnosis: Same  Procedure Name: Bilateral L4-5 decompressive laminotomies and foraminotomies  Surgeon:Arushi Partridge A.Jaion Lagrange, M.D.  Asst. Surgeon: Doran Durand, NP  Anesthesia: General  Indication: 89 year old female with severe bilateral lower extremity radicular pain failing conservative management workup demonstrates evidence of a stable degenerative spondylolisthesis at L4-5 with severe facet arthropathy and severe lateral recess and moderately severe foraminal stenosis.  Patient presents now for decompressive surgery after failing conservative management.  Operative note: After induction of anesthesia, patient position prone on the Wilson frame and properly padded.  Lumbar region prepped.  Sterilely.  Incision made overlying L4.  Dissection performed first on the left.  Retractor placed.  L3-4 level was exposed.  Dissection redirected 1 level caudally.  Retractor placed.  X-ray recently taken.  Level confirmed.  Laminotomy was then performed using high-speed drill and Kerrison rongeurs to remove the inferior aspect lamina of L4 the medial aspect the L4-5 facet joint and the superior rim of the L5 lamina.  Ligament flavum elevated and resected.  Underlying thecal sac and exiting left L5 nerve root were identified.  Decompression was then performed on the course the exiting left L4 nerve root and L5 nerve root and foraminotomies were completed.  There was no evidence of any residual compression.  There was no evidence of injury to the thecal sac or nerve roots.  Dissection was then performed on the right side.  Retractor placed on the right side.  Laminotomy and foraminotomies performed a similar fashion on the right side.  At this point a very thorough decompression had been achieved.  There was no evidence of injury to the thecal sac or nerve  roots.  Wound was then irrigated.  Gelfoam was placed topically for hemostasis.  Wounds then closed in layers with Vicryl sutures.  Steri-Strips and sterile dressing were applied.  No apparent complications.  Patient tolerated the procedure well and she returns to the recovery room postop.

## 2023-03-08 ENCOUNTER — Encounter (HOSPITAL_COMMUNITY): Payer: Self-pay | Admitting: Neurosurgery

## 2023-03-08 DIAGNOSIS — M48062 Spinal stenosis, lumbar region with neurogenic claudication: Secondary | ICD-10-CM | POA: Diagnosis not present

## 2023-03-08 LAB — BASIC METABOLIC PANEL
Anion gap: 9 (ref 5–15)
BUN: 31 mg/dL — ABNORMAL HIGH (ref 8–23)
CO2: 21 mmol/L — ABNORMAL LOW (ref 22–32)
Calcium: 9.8 mg/dL (ref 8.9–10.3)
Chloride: 108 mmol/L (ref 98–111)
Creatinine, Ser: 1.36 mg/dL — ABNORMAL HIGH (ref 0.44–1.00)
GFR, Estimated: 37 mL/min — ABNORMAL LOW (ref >60)
Glucose, Bld: 115 mg/dL — ABNORMAL HIGH (ref 70–99)
Potassium: 4.5 mmol/L (ref 3.5–5.1)
Sodium: 138 mmol/L (ref 135–145)

## 2023-03-08 LAB — GLUCOSE, CAPILLARY: Glucose-Capillary: 96 mg/dL (ref 70–99)

## 2023-03-08 MED ORDER — CYCLOBENZAPRINE HCL 5 MG PO TABS: 5.0000 mg | ORAL_TABLET | Freq: Three times a day (TID) | ORAL | 0 refills | Status: DC | PRN

## 2023-03-08 MED ORDER — HYDROCODONE-ACETAMINOPHEN 5-325 MG PO TABS: 1.0000 | ORAL_TABLET | ORAL | 0 refills | Status: DC | PRN

## 2023-03-08 NOTE — Discharge Summary (Signed)
Physician Discharge Summary  Patient ID: Deanna [name] MRN: [id number] DOB/AGE: [birth year] 89 y.o.  Admit date: 03/07/2023 Discharge date: 03/08/2023  Admission Diagnoses:  Discharge Diagnoses:  Principal Problem:   Lumbar stenosis with neurogenic claudication   Discharged Condition: good  Hospital Course: Patient admitted to the hospital where she underwent L4-5 decompressive laminotomies and foraminotomies.  Postoperatively doing very well.  Preoperative bilateral lower extremity symptoms much improved.  Standing ambulating without difficulty.  Ready  Consults:   Significant Diagnostic Studies:   Treatments:   Discharge Exam: Blood pressure (!) 123/52, pulse 60, temperature 97.8 F (36.6 C), temperature source Oral, resp. rate 16, height 5\' 2"  (1.575 m), weight 47.6 kg, SpO2 99%. Awake and alert.  Oriented and appropriate.  Motor and sensory function intact.  Wound clean and dry.  Chest and abdomen benign.  Disposition:   Discharge Instructions     Face-to-face encounter (required for Medicare/Medicaid patients)   Complete by: As directed    I Deanna [name] certify that this patient is under my care and that I, or a nurse practitioner or physician's assistant working with me, had a face-to-face encounter that meets the physician face-to-face encounter requirements with th is patient on 03/08/2023. The encounter with the patient was in whole, or in part for the following medical condition(s) which is the primary reason for home health care (List medical condition): Lumbar stenosis with neurogenic claudication   The encounter with the patient was in whole, or in part, for the following medical condition, which is the primary reason for home health care: Lumbar stenosis with neurogenic claudication   I certify that, based on my findings, the following services are medically necessary home health services: Physical therapy   Reason for Medically Necessary Home Health Services:  Therapy- Home Adaptation to Facilitate Safety   My clinical findings support the need for the above services: Unable to leave home safely without assistance and/or assistive device   Further, I certify that my clinical findings support that this patient is homebound due to: Pain interferes with ambulation/mobility   Home Health   Complete by: As directed    To provide the following care/treatments:  PT OT        Allergies as of 03/08/2023       Reactions   Codeine Nausea Only        Medication List     TAKE these medications    acetaminophen 325 MG tablet Commonly known as: TYLENOL Take 650 mg by mouth 2 (two) times a day.   ALPRAZolam 0.25 MG tablet Commonly known as: XANAX Take 0.25 mg by mouth daily as needed for anxiety.   amLODipine 10 MG tablet Commonly known as: NORVASC Take 1 tablet (10 mg total) by mouth daily.   budesonide 3 MG 24 hr capsule Commonly known as: ENTOCORT EC daily.   buPROPion 150 MG 24 hr tablet Commonly known as: WELLBUTRIN XL Take 150 mg by mouth daily.   carvedilol 25 MG tablet Commonly known as: COREG Take 1 tablet (25 mg total) by mouth 2 (two) times daily with a meal.   cyclobenzaprine 5 MG tablet Commonly known as: FLEXERIL Take 5 mg by mouth every 8 (eight) hours as needed for muscle spasms. What changed: Another medication with the same name was added. Make sure you understand how and when to take each.   cyclobenzaprine 5 MG tablet Commonly known as: FLEXERIL Take 1 tablet (5 mg total) by mouth 3 (three) times daily as needed  for muscle spasms. What changed: You were already taking a medication with the same name, and this prescription was added. Make sure you understand how and when to take each.   cycloSPORINE 0.05 % ophthalmic emulsion Commonly known as: RESTASIS Place 1 drop into both eyes as needed (dry eyes).   denosumab 60 MG/ML Soln injection Commonly known as: PROLIA Inject 60 mg into the skin every 6 (six)  months. Administer in upper arm, thigh, or abdomen   diclofenac Sodium 1 % Gel Commonly known as: VOLTAREN Apply 2 g topically 4 (four) times daily as needed (painful joints).   escitalopram 20 MG tablet Commonly known as: LEXAPRO Take 20 mg by mouth daily.   esomeprazole 40 MG capsule Commonly known as: NEXIUM Take 40 mg by mouth 2 (two) times daily before a meal.   feeding supplement Liqd Take 237 mLs by mouth 3 (three) times daily between meals. What changed: when to take this   FREESTYLE LITE test strip Generic drug: glucose blood   HumaLOG Junior KwikPen 100 UNIT/ML KwikPen Junior Generic drug: Insulin lispro Inject 4 Units into the skin See admin instructions. If blood glucose is over 200 at bedtime   HYDROcodone-acetaminophen 5-325 MG tablet Commonly known as: NORCO/VICODIN Take 1 tablet by mouth every 4 (four) hours as needed for moderate pain (pain score 4-6) ((score 4 to 6)).   insulin glargine 100 UNIT/ML injection Commonly known as: LANTUS Inject 0.05 mLs (5 Units total) into the skin daily. What changed:  how much to take when to take this   ketoconazole 2 % cream Commonly known as: NIZORAL Apply 1 Application topically daily as needed (Rash).   loperamide 2 MG tablet Commonly known as: IMODIUM A-D Take 2 mg by mouth 4 (four) times daily as needed for diarrhea or loose stools.   losartan 25 MG tablet Commonly known as: COZAAR TAKE 1 TABLET DAILY   pregabalin 75 MG capsule Commonly known as: LYRICA Take 1 capsule (75 mg total) by mouth 2 (two) times daily.   PreserVision AREDS 2 Caps Take 1 capsule by mouth 2 (two) times daily.   PROBIOTIC-10 PO Take 1 capsule by mouth daily. 10 billion cells   rosuvastatin 20 MG tablet Commonly known as: CRESTOR Take 1 tablet (20 mg total) by mouth at bedtime.   Sure Comfort Pen Needles 31G X 5 MM Misc Generic drug: Insulin Pen Needle   Symax-SR 0.375 MG 12 hr tablet Generic drug: hyoscyamine Take 0.375  mg by mouth every 12 (twelve) hours as needed for pain.   traMADol 50 MG tablet Commonly known as: ULTRAM Take 50 mg by mouth 3 (three) times daily as needed for moderate pain (pain score 4-6).   triamcinolone cream 0.1 % Commonly known as: KENALOG as needed (rash).   VITAMIN B 12 PO Take 1,000 mcg by mouth daily.   Vitamin D3 10 MCG (400 UNIT) tablet Take 400 Units by mouth daily.   vitamin E 180 MG (400 UNITS) capsule Take 400 Units by mouth 2 (two) times a day.   Zetia 10 MG tablet Generic drug: ezetimibe daily.         Signed: Kathaleen Maser Braylen Staller 03/08/2023, 8:35 AM

## 2023-03-08 NOTE — Care Management Obs Status (Signed)
MEDICARE OBSERVATION STATUS NOTIFICATION   Patient Details  Name: Deanna [name] MRN: [id number] Date of Birth: Jun 23, 1930   Medicare Observation Status Notification Given:  Yes    Kermit Balo, RN 03/08/2023, 10:52 AM

## 2023-03-08 NOTE — Plan of Care (Signed)

## 2023-03-08 NOTE — Discharge Instructions (Addendum)
Wound Care Keep incision covered and dry three days.  Do not put any creams, lotions, or ointments on incision. Leave steri-strips on back.  They will fall off by themselves.  Activity Walk each and every day, increasing distance each day. No lifting greater than 5 lbs.  Avoid excessive back motion. No driving for 2 weeks; may ride as a passenger locally.  Diet Resume your normal diet.   Call Your Doctor If Any of These Occur Redness, drainage, or swelling at the wound.  Temperature greater than 101 degrees. Severe pain not relieved by pain medication. Incision starts to come apart.  Follow Up Appt Call today for appointment in 1-2 weeks [phone]) or for problems.  If you have any hardware placed in your spine, you will need an x-ray before your appointment.

## 2023-03-08 NOTE — Evaluation (Signed)
Occupational Therapy Evaluation Patient Details Name: Deanna [name] MRN: [id number] DOB: Jun 15, 1930 Today's Date: 03/08/2023   History of Present Illness 89 yo female s/p 1/27 laminectomy and foraminotomy L4-5 PMH arthritis, L breast CA, CKDIII depression DM HTN lower GIB malignant carcinoid tumor of duodenum PAF back surgery x2 appendectomy cervical spine surgery   Clinical Impression   Patient is s/p L4-5 laminectomy and foraminotomy surgery resulting in functional limitations due to the deficits listed below (see OT problem list). Pt at baseline lives indep apartment at [facility] in [place]. Pt uses a rollator and has CNA help x2 per day. Pt is close to baseline at this time with some generalized weakness. Patient will benefit from skilled OT acutely to increase independence and safety with ADLS to allow discharge HHOT.        If plan is discharge home, recommend the following: A little help with walking and/or transfers;A little help with bathing/dressing/bathroom    Functional Status Assessment  Patient has had a recent decline in their functional status and demonstrates the ability to make significant improvements in function in a reasonable and predictable amount of time.  Equipment Recommendations  Other (comment) (requesting new rollator)    Recommendations for Other Services       Precautions / Restrictions Precautions Precautions: Fall;Back Precaution Comments: back precautions and handout provided      Mobility Bed Mobility Overal bed mobility: Needs Assistance Bed Mobility: Rolling, Supine to Sit, Sit to Supine Rolling: Contact guard assist   Supine to sit: Contact guard Sit to supine: Min assist   General bed mobility comments: pt needs min cues for sequence but able  to progress to eob and return to supine    Transfers Overall transfer level: Needs assistance Equipment used: Rolling walker (2 wheels) Transfers: Sit to/from Stand Sit to  Stand: Contact guard assist                  Balance Overall balance assessment: Needs assistance Sitting-balance support: Bilateral upper extremity supported, Feet supported Sitting balance-Leahy Scale: Good     Standing balance support: Bilateral upper extremity supported, During functional activity, Reliant on assistive device for balance Standing balance-Leahy Scale: Fair                             ADL either performed or assessed with clinical judgement   ADL Overall ADL's : Needs assistance/impaired Eating/Feeding: Independent   Grooming: Wash/dry hands;Modified independent   Upper Body Bathing: Supervision/ safety;Sitting   Lower Body Bathing: Supervison/ safety;Sit to/from stand;Adhering to back precautions   Upper Body Dressing : Supervision/safety;Sitting   Lower Body Dressing: Supervision/safety;Adhering to back precautions;Sit to/from stand   Toilet Transfer: Supervision/safety;Ambulation;Regular Toilet   Toileting- Architect and Hygiene: Supervision/safety;Sit to/from stand       Functional mobility during ADLs: Supervision/safety;Rolling walker (2 wheels) General ADL Comments: cues to remain in RW distance and baseline uses rollator     Vision Baseline Vision/History: 1 Wears glasses Ability to See in Adequate Light: 1 Impaired Vision Assessment?: No apparent visual deficits     Perception         Praxis         Pertinent Vitals/Pain Pain Assessment Pain Assessment: Faces Faces Pain Scale: Hurts a little bit Pain Location: back Pain Descriptors / Indicators: Operative site guarding Pain Intervention(s): Limited activity within patient's tolerance, Monitored during session, Repositioned, Premedicated before session     Extremity/Trunk Assessment Upper  Extremity Assessment Upper Extremity Assessment: Right hand dominant;RUE deficits/detail;LUE deficits/detail RUE Deficits / Details: reports at baseline dropping  things and has OT for hand therapy at facility RUE Sensation: history of peripheral neuropathy RUE Coordination: decreased fine motor LUE Sensation: history of peripheral neuropathy LUE Coordination: decreased fine motor   Lower Extremity Assessment Lower Extremity Assessment: Generalized weakness   Cervical / Trunk Assessment Cervical / Trunk Assessment: Kyphotic;Back Surgery   Communication Communication Communication: Hearing impairment (wears hearing aide)   Cognition Arousal: Alert Behavior During Therapy: WFL for tasks assessed/performed Overall Cognitive Status: Within Functional Limits for tasks assessed                                       General Comments  incision dry and dressing intact    Exercises     Shoulder Instructions      Home Living Family/patient expects to be discharged to:: Private residence Living Arrangements: Alone Available Help at Discharge: Personal care attendant Type of Home: Apartment Home Access: Level entry     Home Layout: One level     Bathroom Shower/Tub: Producer, television/film/video: Standard     Home Equipment: Agricultural consultant (2 wheels);Rollator (4 wheels);Shower seat;Hand held shower head;Grab bars - tub/shower;Adaptive equipment Adaptive Equipment: Reacher Additional Comments: has a caregiver that comes 2x per day for a few hours. they help setup meals, do laundry. Pt's son does grocery shopping and daughter helps take pt to appointments      Prior Functioning/Environment Prior Level of Function : Independent/Modified Independent;History of Falls (last six months)             Mobility Comments: ambulates with rollator ADLs Comments: completes all adls indep, has life alert button        OT Problem List: Decreased strength;Impaired balance (sitting and/or standing);Decreased safety awareness;Decreased knowledge of use of DME or AE;Decreased knowledge of precautions      OT  Treatment/Interventions:      OT Goals(Current goals can be found in the care plan section) Acute Rehab OT Goals Patient Stated Goal: to go back to apartment OT Goal Formulation: With patient/family Time For Goal Achievement: 03/22/23 Potential to Achieve Goals: Good  OT Frequency:      Co-evaluation              AM-PAC OT "6 Clicks" Daily Activity     Outcome Measure Help from another person eating meals?: None Help from another person taking care of personal grooming?: None Help from another person toileting, which includes using toliet, bedpan, or urinal?: A Little Help from another person bathing (including washing, rinsing, drying)?: A Little Help from another person to put on and taking off regular upper body clothing?: A Little Help from another person to put on and taking off regular lower body clothing?: A Little 6 Click Score: 20   End of Session Equipment Utilized During Treatment: Rolling walker (2 wheels) Nurse Communication: Mobility status;Precautions  Activity Tolerance: Patient tolerated treatment well Patient left: in bed;with call bell/phone within reach;with family/visitor present  OT Visit Diagnosis: Unsteadiness on feet (R26.81);Muscle weakness (generalized) (M62.81)                Time: 1610-9604 OT Time Calculation (min): 30 min Charges:  OT General Charges $OT Visit: 1 Visit OT Evaluation $OT Eval Moderate Complexity: 1 Mod OT Treatments $Self Care/Home Management : 8-22 mins  Timmothy Euler, OTR/L  Acute Rehabilitation Services Office: [phone] .   Mateo Flow 03/08/2023, 9:40 AM

## 2023-03-08 NOTE — TOC Transition Note (Signed)
Transition of Care [facility]) - Discharge Note   Patient Details  Name: Deanna [name] MRN: [id number] Date of Birth: 1931/01/14  [facility]) CM/SW Contact:  Kermit Balo, RN Phone Number: 03/08/2023, 10:57 AM   Clinical Narrative:     Pt is discharging back to her ILF at [facility]. CM has faxed orders for rehab to: [phone] per [employer] request. They have in house therapy that will provide need rehab.  Rollator ordered through [facility] and will be delivered to the room. Daughter is transporting the patient home.   Final next level of care: Home w Home Health Services Barriers to Discharge: No Barriers Identified   Patient Goals and CMS Choice     Choice offered to / list presented to : Patient      Discharge Placement                       Discharge Plan and Services Additional resources added to the After Visit Summary for                  DME Arranged: Walker rolling with seat DME Agency: [facility] Date DME Agency Contacted: 03/08/23   Representative spoke with at DME Agency: [facility] HH Arranged: PT, OT HH Agency:  ([facility] [place] [facility]) Date HH Agency Contacted: 03/08/23   Representative spoke with at [facility] Agency: orders faxed  Social Drivers of Health (SDOH) Interventions SDOH Screenings   Social Connections: Unknown (06/22/2021)   Received from [facility]  Tobacco Use: Low Risk  (03/07/2023)     Readmission Risk Interventions     No data to display

## 2023-03-08 NOTE — Progress Notes (Signed)
Patient alert and oriented, voiding adequately, skin clean, dry and intact without evidence of skin break down, or symptoms of complications - no redness or edema noted, only slight tenderness at site.  Patient states pain is manageable at time of discharge. Room was checked and accounted for all patient's belongings; discharge instructions concerning her medications, incision care, follow up appointment and when to call the doctor as needed were all discussed with patient by RN and patient and daughter expressed understanding on the instructions given.

## 2023-04-07 ENCOUNTER — Ambulatory Visit (HOSPITAL_BASED_OUTPATIENT_CLINIC_OR_DEPARTMENT_OTHER): Payer: Medicare Other | Admitting: Cardiovascular Disease

## 2023-05-16 ENCOUNTER — Other Ambulatory Visit (HOSPITAL_BASED_OUTPATIENT_CLINIC_OR_DEPARTMENT_OTHER): Payer: Self-pay | Admitting: Cardiovascular Disease

## 2023-06-29 ENCOUNTER — Encounter: Payer: Self-pay | Admitting: Family

## 2023-07-14 ENCOUNTER — Ambulatory Visit: Admitting: Podiatry

## 2023-07-19 ENCOUNTER — Encounter: Payer: Self-pay | Admitting: Family

## 2023-07-19 ENCOUNTER — Ambulatory Visit (INDEPENDENT_AMBULATORY_CARE_PROVIDER_SITE_OTHER): Admitting: Podiatry

## 2023-07-19 DIAGNOSIS — D2372 Other benign neoplasm of skin of left lower limb, including hip: Secondary | ICD-10-CM

## 2023-07-19 DIAGNOSIS — M79676 Pain in unspecified toe(s): Secondary | ICD-10-CM

## 2023-07-19 DIAGNOSIS — B351 Tinea unguium: Secondary | ICD-10-CM

## 2023-07-19 DIAGNOSIS — D2371 Other benign neoplasm of skin of right lower limb, including hip: Secondary | ICD-10-CM

## 2023-07-19 NOTE — Progress Notes (Signed)
 Subjective:  Patient ID: Deanna [name], female    DOB: [birth year],  MRN: [id number] HPI Chief Complaint  Patient presents with   Toe Pain    Lives at [facility] and they generally has a podiatrist that comes out to the facility. They have not been out in about 3 months. Began having bilateral 1st hallux sensitivity around that time. There is a callus on the right 1st. Reports numbness and tingling in feet as well. Last A1c: 7.3. No anticoag.     89 y.o. female presents with the above complaint.   ROS: Denies fever chills nausea mobic muscle aches pains calf pain back pain chest pain shortness of breath.  Past Medical History:  Diagnosis Date   Arthritis    all over   Cancer [facility])    Left Breast   Chronic kidney disease    CKD3   Depression    Diabetes mellitus without complication (HCC)    diet controlled   Dysrhythmia 2021   A.Fib secondary to illness. Resolved.   GERD (gastroesophageal reflux disease)    Goals of care, counseling/discussion 08/17/2019   Hypertension    Iron  deficiency anemia due to chronic blood loss 08/17/2019   Lower GI bleed 08/17/2019   Malignant carcinoid tumor of duodenum (HCC) 09/14/2019   PAF (paroxysmal atrial fibrillation) (HCC) 07/30/2019   Pure hypercholesterolemia 11/26/2020   Past Surgical History:  Procedure Laterality Date   APPENDECTOMY     with lower back surgery   BACK SURGERY     x2   BIOPSY  08/02/2019   Procedure: BIOPSY;  Surgeon: Ace Holder, MD;  Location: [facility];  Service: Gastroenterology;;   BREAST SURGERY Left 1995   complete left mastectomy   CERVICAL SPINE SURGERY     COLONOSCOPY WITH PROPOFOL  N/A 08/02/2019   Procedure: COLONOSCOPY WITH PROPOFOL ;  Surgeon: Ace Holder, MD;  Location: [facility];  Service: Gastroenterology;  Laterality: N/A;   EYE SURGERY Bilateral    cataracts   LUMBAR LAMINECTOMY/DECOMPRESSION MICRODISCECTOMY Bilateral 03/07/2023   Procedure: Laminectomy and  Foraminotomy - Bilateral - Lumbar Four-Five;  Surgeon: Agustina Aldrich, MD;  Location: [facility] OR;  Service: Neurosurgery;  Laterality: Bilateral;  3C   TONSILLECTOMY     at age 67    Current Outpatient Medications:    acetaminophen  (TYLENOL ) 325 MG tablet, Take 650 mg by mouth 2 (two) times a day. , Disp: , Rfl:    ALPRAZolam  (XANAX ) 0.25 MG tablet, Take 0.25 mg by mouth daily as needed for anxiety., Disp: , Rfl:    budesonide  (ENTOCORT EC ) 3 MG 24 hr capsule, daily., Disp: , Rfl:    buPROPion  (WELLBUTRIN  XL) 150 MG 24 hr tablet, Take 150 mg by mouth daily., Disp: , Rfl:    carvedilol  (COREG ) 25 MG tablet, Take 1 tablet (25 mg total) by mouth 2 (two) times daily with a meal., Disp: , Rfl:    Cholecalciferol  (VITAMIN D3) 10 MCG (400 UNIT) tablet, Take 400 Units by mouth daily., Disp: , Rfl:    Cyanocobalamin  (VITAMIN B 12 PO), Take 1,000 mcg by mouth daily., Disp: , Rfl:    cycloSPORINE  (RESTASIS ) 0.05 % ophthalmic emulsion, Place 1 drop into both eyes as needed (dry eyes)., Disp: , Rfl:    denosumab  (PROLIA ) 60 MG/ML SOLN injection, Inject 60 mg into the skin every 6 (six) months. Administer in upper arm, thigh, or abdomen, Disp: , Rfl:    diclofenac Sodium (VOLTAREN) 1 % GEL, Apply 2 g topically 4 (four)  times daily as needed (painful joints)., Disp: , Rfl:    escitalopram  (LEXAPRO ) 20 MG tablet, Take 20 mg by mouth daily., Disp: , Rfl:    esomeprazole (NEXIUM) 40 MG capsule, Take 40 mg by mouth 2 (two) times daily before a meal., Disp: , Rfl:    ezetimibe  (ZETIA ) 10 MG tablet, daily., Disp: , Rfl:    feeding supplement, ENSURE ENLIVE, (ENSURE ENLIVE) LIQD, Take 237 mLs by mouth 3 (three) times daily between meals. (Patient taking differently: Take 237 mLs by mouth daily.), Disp: 237 mL, Rfl: 12   FREESTYLE LITE test strip, , Disp: , Rfl:    insulin  glargine (LANTUS ) 100 UNIT/ML injection, Inject 0.05 mLs (5 Units total) into the skin daily. (Patient taking differently: Inject 36 Units into the skin in  the morning.), Disp: , Rfl:    Insulin  lispro (HUMALOG JUNIOR KWIKPEN) 100 UNIT/ML, Inject 4 Units into the skin See admin instructions. If blood glucose is over 200 at bedtime, Disp: , Rfl:    ketoconazole (NIZORAL) 2 % cream, Apply 1 Application topically daily as needed (Rash)., Disp: , Rfl:    loperamide  (IMODIUM  A-D) 2 MG tablet, Take 2 mg by mouth 4 (four) times daily as needed for diarrhea or loose stools., Disp: , Rfl:    losartan  (COZAAR ) 25 MG tablet, TAKE 1 TABLET DAILY, Disp: 90 tablet, Rfl: 3   Multiple Vitamins-Minerals (PRESERVISION AREDS 2) CAPS, Take 1 capsule by mouth 2 (two) times daily., Disp: , Rfl:    pregabalin  (LYRICA ) 75 MG capsule, Take 1 capsule (75 mg total) by mouth 2 (two) times daily., Disp: 10 capsule, Rfl: 0   Probiotic Product (PROBIOTIC-10 PO), Take 1 capsule by mouth daily. 10 billion cells, Disp: , Rfl:    rosuvastatin  (CRESTOR ) 20 MG tablet, TAKE 1 TABLET AT BEDTIME, Disp: 90 tablet, Rfl: 3   SURE COMFORT PEN NEEDLES 31G X 5 MM MISC, , Disp: , Rfl:    SYMAX -SR 0.375 MG 12 hr tablet, Take 0.375 mg by mouth every 12 (twelve) hours as needed for pain., Disp: , Rfl:    traMADol  (ULTRAM ) 50 MG tablet, Take 50 mg by mouth 3 (three) times daily as needed for moderate pain (pain score 4-6)., Disp: , Rfl:    triamcinolone  cream (KENALOG ) 0.1 %, as needed (rash)., Disp: , Rfl:    vitamin E  400 UNIT capsule, Take 400 Units by mouth 2 (two) times a day. , Disp: , Rfl:    amLODipine  (NORVASC ) 10 MG tablet, Take 1 tablet (10 mg total) by mouth daily., Disp: 90 tablet, Rfl: 3  Allergies  Allergen Reactions   Alendronate     Other Reaction(s): heartburn   Esomeprazole Diarrhea   Codeine Nausea Only and Nausea And Vomiting   Review of Systems Objective:  There were no vitals filed for this visit.  General: Well developed, nourished, in no acute distress, alert and oriented x3   Dermatological: Skin is warm, dry and supple bilateral. Nails x 10 are well maintained;  though they are thick yellow dystrophic clinically mycotic remaining integument appears unremarkable at this time. There are no open sores, no preulcerative lesions, no rash or signs of infection present.  Benign skin lesion forefoot right no open wounds  Vascular: Dorsalis Pedis artery and Posterior Tibial artery pedal pulses are 2/4 bilateral with immedate capillary fill time. Pedal hair growth present. No varicosities and no lower extremity edema present bilateral.   Neruologic: Grossly intact via light touch bilateral. Vibratory intact via tuning fork  bilateral. Protective threshold with Semmes Wienstein monofilament intact to all pedal sites bilateral. Patellar and Achilles deep tendon reflexes 2+ bilateral. No Babinski or clonus noted bilateral.   Musculoskeletal: No gross boney pedal deformities bilateral. No pain, crepitus, or limitation noted with foot and ankle range of motion bilateral. Muscular strength 5/5 in all groups tested bilateral.  Gait: Unassisted, Nonantalgic.    Radiographs:  None taken  Assessment & Plan:   Assessment: Benign skin lesion forefoot right painful elongated and mycotic nails.  Plan: Debridement of benign skin lesion without iatrogenic lesion.  Debridement of toenails 1 through 5 bilateral     Kenni Newton T. [place], [place]

## 2023-07-28 ENCOUNTER — Other Ambulatory Visit (HOSPITAL_BASED_OUTPATIENT_CLINIC_OR_DEPARTMENT_OTHER): Payer: Self-pay | Admitting: Cardiovascular Disease

## 2024-01-18 ENCOUNTER — Encounter (HOSPITAL_BASED_OUTPATIENT_CLINIC_OR_DEPARTMENT_OTHER): Payer: Self-pay | Admitting: Cardiovascular Disease

## 2024-02-10 ENCOUNTER — Telehealth (HOSPITAL_BASED_OUTPATIENT_CLINIC_OR_DEPARTMENT_OTHER): Payer: Self-pay | Admitting: Cardiovascular Disease

## 2024-02-10 NOTE — Telephone Encounter (Signed)
" °*  STAT* If patient is at the pharmacy, call can be transferred to refill team.   1. Which medications need to be refilled? (please list name of each medication and dose if known)   rosuvastatin  (CRESTOR ) 20 MG tablet   2. Would you like to learn more about the convenience, safety, & potential cost savings by using the [facility]?   3. Are you open to using the [facility] (Type [facility]. ).  4. Which pharmacy/location (including street and city if local pharmacy) is medication to be sent to?  [facility] - [place], [place] - [address]   5. Do they need a 30 day or 90 day supply?   90 day   Caller Rosaline) stated patient still has some medication.  Patient scheduled with EMERSON Bane on 4/3. "

## 2024-02-14 MED ORDER — ROSUVASTATIN CALCIUM 20 MG PO TABS: 20.0000 mg | ORAL_TABLET | Freq: Every day | ORAL | 0 refills | Status: AC

## 2024-02-14 NOTE — Telephone Encounter (Signed)
 90 day refill sent to get pt to her appointment.  They can send in the year supply at follow-up in April.

## 2024-05-11 ENCOUNTER — Encounter (HOSPITAL_BASED_OUTPATIENT_CLINIC_OR_DEPARTMENT_OTHER): Admitting: Nurse Practitioner

## 2024-06-13 ENCOUNTER — Encounter (HOSPITAL_BASED_OUTPATIENT_CLINIC_OR_DEPARTMENT_OTHER): Admitting: Cardiovascular Disease
